# Patient Record
Sex: Female | Born: 1960 | Race: Black or African American | Hispanic: No | Smoking: Former smoker
Health system: Southern US, Community
[De-identification: ages and names within clinical notes are randomized; demographics above are authoritative.]

## PROBLEM LIST (undated history)

## (undated) DIAGNOSIS — Z794 Long term (current) use of insulin: Secondary | ICD-10-CM

## (undated) DIAGNOSIS — D631 Anemia in chronic kidney disease: Secondary | ICD-10-CM

## (undated) DIAGNOSIS — E785 Hyperlipidemia, unspecified: Secondary | ICD-10-CM

## (undated) DIAGNOSIS — E119 Type 2 diabetes mellitus without complications: Secondary | ICD-10-CM

## (undated) DIAGNOSIS — E039 Hypothyroidism, unspecified: Secondary | ICD-10-CM

## (undated) DIAGNOSIS — N186 End stage renal disease: Secondary | ICD-10-CM

## (undated) DIAGNOSIS — K219 Gastro-esophageal reflux disease without esophagitis: Secondary | ICD-10-CM

## (undated) DIAGNOSIS — Z992 Dependence on renal dialysis: Secondary | ICD-10-CM

## (undated) DIAGNOSIS — I471 Supraventricular tachycardia, unspecified: Secondary | ICD-10-CM

## (undated) DIAGNOSIS — M1990 Unspecified osteoarthritis, unspecified site: Secondary | ICD-10-CM

## (undated) DIAGNOSIS — N039 Chronic nephritic syndrome with unspecified morphologic changes: Secondary | ICD-10-CM

## (undated) DIAGNOSIS — E1142 Type 2 diabetes mellitus with diabetic polyneuropathy: Secondary | ICD-10-CM

## (undated) DIAGNOSIS — I10 Essential (primary) hypertension: Secondary | ICD-10-CM

## (undated) DIAGNOSIS — R011 Cardiac murmur, unspecified: Secondary | ICD-10-CM

## (undated) DIAGNOSIS — K3184 Gastroparesis: Secondary | ICD-10-CM

## (undated) DIAGNOSIS — D573 Sickle-cell trait: Secondary | ICD-10-CM

## (undated) DIAGNOSIS — E1143 Type 2 diabetes mellitus with diabetic autonomic (poly)neuropathy: Secondary | ICD-10-CM

## (undated) DIAGNOSIS — I739 Peripheral vascular disease, unspecified: Secondary | ICD-10-CM

## (undated) HISTORY — PX: TRANSTHORACIC ECHOCARDIOGRAM: SHX275

## (undated) HISTORY — DX: Essential (primary) hypertension: I10

## (undated) HISTORY — PX: KNEE ARTHROPLASTY: SHX992

## (undated) HISTORY — PX: THYROIDECTOMY: SHX17

## (undated) HISTORY — DX: Supraventricular tachycardia: I47.1

## (undated) HISTORY — PX: PERIPHERAL VASCULAR CATHETERIZATION: SHX172C

## (undated) HISTORY — DX: Supraventricular tachycardia, unspecified: I47.10

## (undated) HISTORY — PX: NM MYOVIEW LTD: HXRAD82

## (undated) HISTORY — DX: Peripheral vascular disease, unspecified: I73.9

## (undated) HISTORY — DX: Dependence on renal dialysis: N18.6

## (undated) HISTORY — DX: Type 2 diabetes mellitus with diabetic polyneuropathy: E11.42

## (undated) HISTORY — PX: THYROID SURGERY: SHX805

## (undated) HISTORY — DX: Type 2 diabetes mellitus without complications: Z79.4

## (undated) HISTORY — DX: Type 2 diabetes mellitus without complications: E11.9

## (undated) HISTORY — DX: Dependence on renal dialysis: Z99.2

## (undated) HISTORY — PX: KNEE SURGERY: SHX244

## (undated) HISTORY — PX: CATARACT EXTRACTION, BILATERAL: SHX1313

## (undated) HISTORY — DX: Hypothyroidism, unspecified: E03.9

## (undated) HISTORY — PX: ABDOMINAL HYSTERECTOMY: SHX81

## (undated) HISTORY — DX: Anemia in chronic kidney disease: D63.1

## (undated) HISTORY — DX: Chronic nephritic syndrome with unspecified morphologic changes: N03.9

---

## 2007-05-17 ENCOUNTER — Encounter: Admission: RE | Admit: 2007-05-17 | Discharge: 2007-07-25 | Payer: Self-pay | Admitting: Orthopedic Surgery

## 2007-08-30 ENCOUNTER — Ambulatory Visit (HOSPITAL_COMMUNITY): Admission: RE | Admit: 2007-08-30 | Discharge: 2007-08-30 | Payer: Self-pay | Admitting: Obstetrics & Gynecology

## 2007-10-11 ENCOUNTER — Emergency Department (HOSPITAL_COMMUNITY): Admission: EM | Admit: 2007-10-11 | Discharge: 2007-10-12 | Payer: Self-pay | Admitting: Emergency Medicine

## 2007-10-30 ENCOUNTER — Emergency Department (HOSPITAL_COMMUNITY): Admission: EM | Admit: 2007-10-30 | Discharge: 2007-10-30 | Payer: Self-pay | Admitting: Emergency Medicine

## 2008-01-23 ENCOUNTER — Emergency Department (HOSPITAL_COMMUNITY): Admission: EM | Admit: 2008-01-23 | Discharge: 2008-01-23 | Payer: Self-pay | Admitting: Emergency Medicine

## 2008-01-31 ENCOUNTER — Emergency Department (HOSPITAL_COMMUNITY): Admission: EM | Admit: 2008-01-31 | Discharge: 2008-02-01 | Payer: Self-pay | Admitting: Emergency Medicine

## 2008-04-08 ENCOUNTER — Emergency Department (HOSPITAL_COMMUNITY): Admission: EM | Admit: 2008-04-08 | Discharge: 2008-04-08 | Payer: Self-pay | Admitting: Emergency Medicine

## 2008-06-15 ENCOUNTER — Inpatient Hospital Stay (HOSPITAL_COMMUNITY): Admission: EM | Admit: 2008-06-15 | Discharge: 2008-06-17 | Payer: Self-pay | Admitting: Emergency Medicine

## 2008-08-02 ENCOUNTER — Inpatient Hospital Stay (HOSPITAL_COMMUNITY): Admission: AD | Admit: 2008-08-02 | Discharge: 2008-08-06 | Payer: Self-pay | Admitting: Cardiovascular Disease

## 2008-08-02 ENCOUNTER — Emergency Department (HOSPITAL_COMMUNITY): Admission: EM | Admit: 2008-08-02 | Discharge: 2008-08-02 | Payer: Self-pay | Admitting: Emergency Medicine

## 2009-02-26 ENCOUNTER — Ambulatory Visit (HOSPITAL_COMMUNITY): Admission: RE | Admit: 2009-02-26 | Discharge: 2009-02-26 | Payer: Self-pay | Admitting: Obstetrics & Gynecology

## 2009-03-08 ENCOUNTER — Inpatient Hospital Stay (HOSPITAL_COMMUNITY): Admission: EM | Admit: 2009-03-08 | Discharge: 2009-03-13 | Payer: Self-pay | Admitting: Emergency Medicine

## 2009-03-10 ENCOUNTER — Encounter (INDEPENDENT_AMBULATORY_CARE_PROVIDER_SITE_OTHER): Payer: Self-pay | Admitting: Internal Medicine

## 2009-06-06 ENCOUNTER — Emergency Department (HOSPITAL_COMMUNITY): Admission: EM | Admit: 2009-06-06 | Discharge: 2009-06-06 | Payer: Self-pay | Admitting: Emergency Medicine

## 2010-01-18 HISTORY — PX: OTHER SURGICAL HISTORY: SHX169

## 2010-03-25 DIAGNOSIS — I471 Supraventricular tachycardia, unspecified: Secondary | ICD-10-CM | POA: Insufficient documentation

## 2010-09-20 HISTORY — PX: ARTERIOVENOUS GRAFT PLACEMENT: SUR1029

## 2010-10-12 ENCOUNTER — Encounter: Payer: Self-pay | Admitting: Obstetrics & Gynecology

## 2010-12-25 LAB — CBC
HCT: 28.9 % — ABNORMAL LOW (ref 36.0–46.0)
Platelets: 223 10*3/uL (ref 150–400)
RBC: 4.07 MIL/uL (ref 3.87–5.11)
WBC: 6.1 10*3/uL (ref 4.0–10.5)

## 2010-12-25 LAB — DIFFERENTIAL
Basophils Absolute: 0 10*3/uL (ref 0.0–0.1)
Basophils Relative: 1 % (ref 0–1)
Eosinophils Relative: 4 % (ref 0–5)
Lymphocytes Relative: 34 % (ref 12–46)
Lymphs Abs: 2.1 10*3/uL (ref 0.7–4.0)
Monocytes Relative: 9 % (ref 3–12)
Neutrophils Relative %: 53 % (ref 43–77)

## 2010-12-25 LAB — POCT CARDIAC MARKERS
CKMB, poc: <1 ng/mL — ABNORMAL LOW (ref 1.0–8.0)
Troponin i, poc: <0.05 ng/mL (ref 0.00–0.09)

## 2010-12-25 LAB — BASIC METABOLIC PANEL
BUN: 45 mg/dL — ABNORMAL HIGH (ref 6–23)
Calcium: 8.9 mg/dL (ref 8.4–10.5)
Creatinine, Ser: 4.15 mg/dL — ABNORMAL HIGH (ref 0.4–1.2)
GFR calc Af Amer: 14 mL/min — ABNORMAL LOW (ref >60)
GFR calc non Af Amer: 11 mL/min — ABNORMAL LOW (ref >60)

## 2010-12-25 LAB — CK TOTAL AND CKMB (NOT AT ARMC)
CK, MB: 1.6 ng/mL (ref 0.3–4.0)
Relative Index: 0.8 (ref 0.0–2.5)

## 2010-12-28 LAB — BASIC METABOLIC PANEL
BUN: 42 mg/dL — ABNORMAL HIGH (ref 6–23)
Calcium: 8.5 mg/dL (ref 8.4–10.5)
Calcium: 8.5 mg/dL (ref 8.4–10.5)
Chloride: 102 mEq/L (ref 96–112)
Chloride: 106 mEq/L (ref 96–112)
Creatinine, Ser: 3.24 mg/dL — ABNORMAL HIGH (ref 0.4–1.2)
Creatinine, Ser: 3.71 mg/dL — ABNORMAL HIGH (ref 0.4–1.2)
GFR calc Af Amer: 16 mL/min — ABNORMAL LOW (ref >60)
GFR calc Af Amer: 17 mL/min — ABNORMAL LOW (ref >60)
GFR calc Af Amer: 18 mL/min — ABNORMAL LOW (ref >60)
GFR calc non Af Amer: 14 mL/min — ABNORMAL LOW (ref >60)
GFR calc non Af Amer: 15 mL/min — ABNORMAL LOW (ref >60)
GFR calc non Af Amer: 16 mL/min — ABNORMAL LOW (ref >60)
Glucose, Bld: 216 mg/dL — ABNORMAL HIGH (ref 70–99)
Potassium: 4.4 mEq/L (ref 3.5–5.1)
Potassium: 4.9 mEq/L (ref 3.5–5.1)
Sodium: 133 mEq/L — ABNORMAL LOW (ref 135–145)
Sodium: 136 mEq/L (ref 135–145)

## 2010-12-28 LAB — URINALYSIS, ROUTINE W REFLEX MICROSCOPIC
Glucose, UA: NEGATIVE mg/dL
Specific Gravity, Urine: 1.013 (ref 1.005–1.030)
Urobilinogen, UA: 0.2 mg/dL (ref 0.0–1.0)
pH: 6 (ref 5.0–8.0)

## 2010-12-28 LAB — CK TOTAL AND CKMB (NOT AT ARMC): Relative Index: 0.7 (ref 0.0–2.5)

## 2010-12-28 LAB — POCT I-STAT, CHEM 8
BUN: 39 mg/dL — ABNORMAL HIGH (ref 6–23)
Calcium, Ion: 0.98 mmol/L — ABNORMAL LOW (ref 1.12–1.32)
Chloride: 107 mEq/L (ref 96–112)
Creatinine, Ser: 3.3 mg/dL — ABNORMAL HIGH (ref 0.4–1.2)
Glucose, Bld: 78 mg/dL (ref 70–99)
TCO2: 24 mmol/L (ref 0–100)

## 2010-12-28 LAB — CBC
HCT: 24 % — ABNORMAL LOW (ref 36.0–46.0)
HCT: 27.1 % — ABNORMAL LOW (ref 36.0–46.0)
Hemoglobin: 7.8 g/dL — CL (ref 12.0–15.0)
Hemoglobin: 8.4 g/dL — ABNORMAL LOW (ref 12.0–15.0)
Hemoglobin: 8.8 g/dL — ABNORMAL LOW (ref 12.0–15.0)
MCHC: 32.2 g/dL (ref 30.0–36.0)
MCHC: 32.4 g/dL (ref 30.0–36.0)
MCHC: 32.7 g/dL (ref 30.0–36.0)
MCV: 72 fL — ABNORMAL LOW (ref 78.0–100.0)
Platelets: 169 10*3/uL (ref 150–400)
Platelets: 184 10*3/uL (ref 150–400)
Platelets: 184 10*3/uL (ref 150–400)
RBC: 3.35 MIL/uL — ABNORMAL LOW (ref 3.87–5.11)
RBC: 3.46 MIL/uL — ABNORMAL LOW (ref 3.87–5.11)
RBC: 3.51 MIL/uL — ABNORMAL LOW (ref 3.87–5.11)
RBC: 3.6 MIL/uL — ABNORMAL LOW (ref 3.87–5.11)
RBC: 3.76 MIL/uL — ABNORMAL LOW (ref 3.87–5.11)
RDW: 15 % (ref 11.5–15.5)
RDW: 15.2 % (ref 11.5–15.5)
WBC: 4.8 10*3/uL (ref 4.0–10.5)
WBC: 5.6 10*3/uL (ref 4.0–10.5)
WBC: 5.9 10*3/uL (ref 4.0–10.5)

## 2010-12-28 LAB — COMPREHENSIVE METABOLIC PANEL
ALT: 13 U/L (ref 0–35)
Albumin: 3 g/dL — ABNORMAL LOW (ref 3.5–5.2)
Alkaline Phosphatase: 74 U/L (ref 39–117)
Calcium: 8.4 mg/dL (ref 8.4–10.5)
GFR calc Af Amer: 20 mL/min — ABNORMAL LOW (ref >60)
Glucose, Bld: 93 mg/dL (ref 70–99)
Potassium: 4 mEq/L (ref 3.5–5.1)
Sodium: 141 mEq/L (ref 135–145)
Total Protein: 6.3 g/dL (ref 6.0–8.3)

## 2010-12-28 LAB — DIFFERENTIAL
Basophils Relative: 1 % (ref 0–1)
Eosinophils Absolute: 0.2 10*3/uL (ref 0.0–0.7)
Eosinophils Absolute: 0.2 10*3/uL (ref 0.0–0.7)
Lymphs Abs: 1.9 10*3/uL (ref 0.7–4.0)
Monocytes Absolute: 0.5 10*3/uL (ref 0.1–1.0)
Monocytes Absolute: 0.5 10*3/uL (ref 0.1–1.0)
Monocytes Relative: 10 % (ref 3–12)
Monocytes Relative: 9 % (ref 3–12)
Neutro Abs: 2 10*3/uL (ref 1.7–7.7)
Neutrophils Relative %: 43 % (ref 43–77)
Neutrophils Relative %: 44 % (ref 43–77)

## 2010-12-28 LAB — GLUCOSE, CAPILLARY
Glucose-Capillary: 120 mg/dL — ABNORMAL HIGH (ref 70–99)
Glucose-Capillary: 134 mg/dL — ABNORMAL HIGH (ref 70–99)
Glucose-Capillary: 172 mg/dL — ABNORMAL HIGH (ref 70–99)
Glucose-Capillary: 183 mg/dL — ABNORMAL HIGH (ref 70–99)
Glucose-Capillary: 219 mg/dL — ABNORMAL HIGH (ref 70–99)
Glucose-Capillary: 223 mg/dL — ABNORMAL HIGH (ref 70–99)
Glucose-Capillary: 254 mg/dL — ABNORMAL HIGH (ref 70–99)
Glucose-Capillary: 279 mg/dL — ABNORMAL HIGH (ref 70–99)

## 2010-12-28 LAB — TROPONIN I: Troponin I: 0.02 ng/mL (ref 0.00–0.06)

## 2010-12-28 LAB — RENAL FUNCTION PANEL
BUN: 36 mg/dL — ABNORMAL HIGH (ref 6–23)
CO2: 28 mEq/L (ref 19–32)
Calcium: 8.8 mg/dL (ref 8.4–10.5)
Creatinine, Ser: 3.49 mg/dL — ABNORMAL HIGH (ref 0.4–1.2)
Glucose, Bld: 162 mg/dL — ABNORMAL HIGH (ref 70–99)

## 2010-12-28 LAB — PROTEIN, URINE, 24 HOUR
Collection Interval-UPROT: 24 hours
Urine Total Volume-UPROT: 2500 mL

## 2010-12-28 LAB — URINE MICROSCOPIC-ADD ON

## 2010-12-28 LAB — T4, FREE: Free T4: 0.72 ng/dL — ABNORMAL LOW (ref 0.80–1.80)

## 2010-12-28 LAB — CARDIAC PANEL(CRET KIN+CKTOT+MB+TROPI)
CK, MB: 1.6 ng/mL (ref 0.3–4.0)
CK, MB: 2.1 ng/mL (ref 0.3–4.0)
CK, MB: 2.5 ng/mL (ref 0.3–4.0)
Relative Index: 0.8 (ref 0.0–2.5)
Relative Index: 0.9 (ref 0.0–2.5)
Total CK: 195 U/L — ABNORMAL HIGH (ref 7–177)
Total CK: 227 U/L — ABNORMAL HIGH (ref 7–177)
Troponin I: 0.01 ng/mL (ref 0.00–0.06)
Troponin I: 0.02 ng/mL (ref 0.00–0.06)

## 2010-12-28 LAB — VITAMIN B12: Vitamin B-12: 629 pg/mL (ref 211–911)

## 2010-12-28 LAB — PROTIME-INR: INR: 0.9 (ref 0.00–1.49)

## 2010-12-28 LAB — IRON AND TIBC
Iron: 55 ug/dL (ref 42–135)
Saturation Ratios: 21 % (ref 20–55)
TIBC: 262 ug/dL (ref 250–470)
UIBC: 207 ug/dL

## 2011-02-02 NOTE — Discharge Summary (Signed)
[name], [name]               ACCOUNT NO.:  [id number]   MEDICAL RECORD NO.:  [id number]          PATIENT TYPE:  INP   LOCATION:  [id number]                         FACILITY:  [facility]   PHYSICIAN:  Birdie Riddle, M.D.  DATE OF BIRTH:  1960/10/26   DATE OF ADMISSION:  06/15/2008  DATE OF DISCHARGE:  06/17/2008                               DISCHARGE SUMMARY   FINAL DIAGNOSES:  1. Chest pain.  2. Cardiac dysrhythmia.  3. Hypothyroidism.  4. Hypertension.  5. Diabetes mellitus type 2.  6. Anemia.  7. Tobacco use disorder.  8. Sickle cell trait.  9. Hypercholesterolemia.   DISCHARGE MEDICATIONS:  1. Insulin 75/25 as 25 units in the morning and 20 units in the      evening.  2. Lisinopril/hydrochlorothiazide 20/25 one daily in a.m.  3. Cardizem 180 mg one daily.  4. Folic acid 1 mg daily.  5. Metformin 850 mg twice daily.  6. Lipitor 20 mg one in the evening.  7. Protonix 40 mg one daily.  8. Iron as ferrous sulfate 325 mg one daily.  9. Multivitamin one daily.  10.Aspirin was held for concern for GI bleed.   DISCHARGE DIET:  Low-sodium, heart-healthy diet.   DISCHARGE ACTIVITY:  The patient to increase activity slowly.   SPECIAL INSTRUCTIONS:  1. The patient to stop any activity that causes chest pain, shortness      of breath, dizziness, sweating, or excessive weakness.  2. Followup by Dr. Dixie Dials in 2 weeks.  The patient to call [phone] for appointment.   HISTORY:  This 50 year old black female presented with left-sided chest  tightness along with some dizziness, weakness feeling lasting for 3-4  hours.  The patient also reported some palpitations in absence of  excessive caffeine intake.   PHYSICAL EXAMINATION:  GENERAL:  The patient was alert and oriented x3.  VITAL SIGNS:  Pulse is 130 to 59 in sinus rhythm, blood pressure 131/78.  HEENT:  Conjunctivae pink.  NECK:  Supple.  No JVD.  No bruit.  LUNGS:  Clear to auscultation.  CARDIOVASCULAR:  Regular  rate and rhythm.  Normal S1 and S2.  Soft  systolic murmur.  Negative S3 or gallop.  ABDOMEN:  Soft, bowel sounds present, nontender.  EXTREMITIES:  No edema, cyanosis, or clubbing.   LABORATORY DATA:  Normal WBC count, platelet count, hemoglobin slightly  low at 9.6, retic count was normal.  PT/INR was normal.  Electrolytes  were normal.  Sugar elevated at 209, BUN slightly high at 26, creatinine  slightly high at 1.23.  Cardiac enzymes were essentially unremarkable  with a borderline total CK levels.  Cholesterol level was 112 with LDL  level of 37, HDL level of 38, and triglyceride of 186.  Thyroid  stimulating hormone was 2.2.  Folate level was more than 20 ng/mL.  Iron  level was 58.   Nuclear stress test was negative for inducible reversible ischemia.   Chest x-ray showed low lung volume, otherwise no acute cardiopulmonary  disease.   EKG showed sinus bradycardia with possible anterior infarct.  HOSPITAL COURSE:  The patient was admitted to telemetry unit.  Myocardial infarction was ruled out.  She underwent nuclear stress test  that failed to show any reversible ischemia.  The patient had a history  of chronic anemia.  She had complete GI workup done 2 years ago in  [place], [place] that included upper and lower endoscopy.  The patient desired to have further GI workup done on outpatient basis.  Hence, she was discharged on June 17, 2008, in satisfactory  condition with followup by me in 2 weeks.      Birdie Riddle, M.D.  Electronically Signed     ASK/MEDQ  D:  07/20/2008  T:  07/20/2008  Job:  [id number]

## 2011-02-02 NOTE — H&P (Signed)
[name], [name]               ACCOUNT NO.:  [id number]   MEDICAL RECORD NO.:  [id number]           PATIENT TYPE:   LOCATION:                                 FACILITY:   PHYSICIAN:  Benito Mccreedy, M.D.DATE OF BIRTH:  [birth year]   DATE OF ADMISSION:  03/08/2009  DATE OF DISCHARGE:                              HISTORY & PHYSICAL   ADMITTING DIAGNOSES:  1. Hypertensive crisis.  2. Peripheral edema.  3. Renal insufficiency.  4. History of hypertension.  5. Diabetes mellitus.  6. Anemia of chronic disease.  7. Cigarette smoking.  8. Hyperlipidemia.   CHIEF COMPLAINTS:  High blood pressure headaches.   The patient is a 50 year old lady with history of multiple medical  problems as noted above.  She has been seen by her PCP for elevated  blood pressure with associated worsening of peripheral edema and was  initiated on Lasix diuresis last week.  However, the patient came to the  ED because of severe blood pressure more than Q000111Q systolic.  The patient  is still with frontal headaches.  On admission, the patient received  clonidine with improvement of her blood pressure.  She was admitted for  further management and followup.  She denies any history of cough, fever  or chills, visual changes, extremity weakness, or dizziness.  She has  had no chest pain or shortness of breath.  She had some nausea without  vomiting.  No abdominal pain, no dysuria, hematuria, progressive  micturition.   PAST MEDICAL HISTORY:  As noted above.   ALLERGIES:  The patient is having no known medication allergies.   CURRENT MEDICATIONS:  1. Diltiazem.  2. Lasix 20 mg daily.  3. Isosorbide mononitrate 60 mg daily.  4. Lisinopril 20 mg daily.  5. Protonix 40 mg daily.  6. Ferrous sulfate 325 mg daily.  7. Metoprolol 25 mg daily.  8. Multivitamin 1 capsule daily.  9. Levothyroxine 100 mcg daily.  10.Folic acid 1 mg daily.  11.Lipitor 20 mg daily.  12.Metformin 850 mg b.i.d.  13.Humalog  subcutaneous injection 75/25, 25 units b.i.d.   Family history is positive for hypertension.   SOCIAL HISTORY:  The patient smokes cigarettes but does not drink  alcohol.   REVIEW OF SYSTEMS:  On 12-point systemic enquiry except as noted above  was unremarkable.   PHYSICAL EXAMINATION:  VITAL SIGNS:  Blood pressure came down to Q000111Q  systolic, temp 0000000, pulse 67, respirations 18, O2 saturation 98% on  room air.  GENERAL:  The patient was not in acute cardiopulmonary distress.  HEENT:  Normocephalic, atraumatic.  Pupils equal, round, and reactive to  light.  Extraocular movements intact.  Oropharynx moist.  Sclerae,  pallor present.  NECK:  Supple.  No JVD.  LUNGS:  Clear to auscultation.  CARDIAC:  Regular.  No gallops.  ABDOMEN:  Soft.  Bowel sounds are present.  EXTREMITIES:  No cyanosis.  The pedal edema noted, right slightly more  than left.   Labs reviewed with x-ray of the chest indicating cardiomegaly without  acute infiltrates.  White count 4.6, hemoglobin 8.4, hematocrit 26.0,  platelet count 184.  Sodium 141, potassium 4.0, chloride 107, CO2 28,  glucose 93, BUN 33, creatinine 2.99, albumin 3.0; otherwise, complete  metabolic panel was unremarkable.  Total CPK 185, MB fraction 1.6.  Relative index 0.8.  TSH was elevated at 13.618.   ASSESSMENT/PLAN:  A 51 year old lady with history of hypertension and  multiple medical problems presenting with worsening hypertension  associated with headaches.  Systolic blood pressure more than 223,  however, improved with ED management.  The patient was admitted for  blood pressure control, monitor of her renal function, supportive care.  We will get a 2-D echocardiogram for her cardiomegaly with peripheral  edema.  We may have to cut back on ACE inhibitor if she has renal  insufficiency.  The patient has thyroid management, we have to be  optimize further with elevated TSH by increasing dose of levothyroxine.      Benito Mccreedy, M.D.  Electronically Signed     GO/MEDQ  D:  03/08/2009  T:  03/09/2009  Job:  [id number]   cc:   Nolene Ebbs, M.D.

## 2011-02-02 NOTE — Discharge Summary (Signed)
NAMEODALY, DENBY               ACCOUNT NO.:  [id number]   MEDICAL RECORD NO.:  [id number]          PATIENT TYPE:  OBV   LOCATION:  [id number]                         FACILITY:  [facility]   PHYSICIAN:  Birdie Riddle, M.D.  DATE OF BIRTH:  November 12, 1960   DATE OF ADMISSION:  08/02/2008  DATE OF DISCHARGE:  08/06/2008                               DISCHARGE SUMMARY   FINAL DIAGNOSES:  1. Abdominal pain.  2. Constipation.  3. Diabetic gastroparesis.  4. Colonic dysfunction.  5. Uncontrolled hypertension.  6. Dehydration.  7. Hypothyroidism.   DISCHARGE MEDICATIONS:  1. Lisinopril 20 mg 1 daily.  2. Nitroglycerin patch 0.4 mg per hour, apply new one in the morning      and off at bedtime.  3. MiraLax 17 g in 8 ounce fluid daily or Smooth Move as before.  4. Cardizem 180 mg 1 daily in the morning.  5. Folic acid 1 mg 1 daily in the morning.  6. Metformin 850 mg 1 twice daily.  7. Lipitor 20 mg 1 in the evening.  8. Protonix 40 mg 1 daily in the morning.  9. Ferrous sulfate 325 mg 1 in the evening.  10.Multivitamin 1 daily in the morning.  11.Synthroid 100 mcg 1 daily in the morning.  12.Insulin 75/25 units in the morning and 20 units in the evening      subcutaneously.   FOLLOWUP:  With Dr. Dixie Dials in 2 weeks.  The patient to call [phone] for appointment.   DISCHARGE DIET:  Low-sodium, heart healthy diet along with the low  calorie carbohydrate modified diet.   DISCHARGE ACTIVITY:  The patient to increase activity slowly and to stop  any activity that causes chest pain, shortness of breath, dizziness,  sweating, or excessive weakness.   HISTORY:  This 50 year old black female with a history of diabetes and  hypertension was not able to keep any of her medications down including  the liquids.  She went to the emergency room where x-ray was consistent  with constipation, but the blood pressure was 200/110 in the office.  The patient denies any headache, but had some  dizziness and had elevated  BUN and creatinine.   PAST MEDICAL HISTORY:  Positive for diabetes for 20 years and  hypertension, an elevated cholesterol level along with a chronic history  of anemia with GI workup done in [place], [place].   PHYSICAL EXAMINATION:  VITAL SIGNS:  Pulse 107, respirations 18, blood  pressure 200/110, height 5 feet 2 inches, and weight 177 pounds.  HEENT:  The patient is normocephalic and atraumatic with brown eyes.  Conjunctivae pink.  Sclerae white.  NECK:  No JVD.  No carotid bruits.  LUNGS:  Clear bilaterally.  HEART:  Normal S1 and S2.  ABDOMEN:  Soft questionable tenderness.  EXTREMITIES:  No edema, cyanosis, or clubbing.  CNS:  The patient moves all 4 extremities has bilateral equal grips and  she is right-handed.   LABORATORY DATA:  The patient had a low hemoglobin of 8.8.  Normal WBC  count and normal platelet count.  Normal electrolytes,  BUN and  creatinine.  Sugars slightly high at 147.   X-ray of the abdomen consistent with constipation.  H. pylori test  borderline and nuclear medicine gastric emptying was within normal  limits.   HOSPITAL COURSE:  The patient was admitted to Telemetry Unit.  Her most  of the oral medications were held.  She was given Zofran.  She was  started on IV fluid, nitroglycerin patch, and clonidine patch were used  for blood pressure control and then she was started on the clear  liquids.  The patient tolerated liquids well, although she continued to  have constipation.  She underwent gastric emptying test which was  normal.  Her abdominal pain had resolved on its own and her blood  pressure was controlled, hence on August 06, 2008, she was discharged  home in satisfactory condition with discontinuation of clonidine for low  heart rate of 50s and metoprolol was changed back to Cardizem,  nitroglycerin patch was added for blood pressure control, and she will  have outpatient GI workup.      Birdie Riddle, M.D.  Electronically Signed     ASK/MEDQ  D:  08/06/2008  T:  08/07/2008  Job:  [id number]

## 2011-02-05 NOTE — Discharge Summary (Signed)
NAMESHAYENNE, DONNA               ACCOUNT NO.:  [id number]   MEDICAL RECORD NO.:  [id number]          PATIENT TYPE:  INP   LOCATION:  4705                         FACILITY:  [facility]   PHYSICIAN:  Nolene Ebbs, M.D.    DATE OF BIRTH:  02-May-1961   DATE OF ADMISSION:  03/07/2009  DATE OF DISCHARGE:  03/13/2009                               DISCHARGE SUMMARY   HISTORY OF PRESENT ILLNESS:  Ms. Elting is a 50 year old African  American lady with past medical history significant for systemic  hypertension, type 2 diabetes mellitus, chronic kidney disease, anemia  of chronic disease, hyperlipidemia and tobacco abuse.  She came to the  emergency room at [facility] with progressively worsening  peripheral leg edema that is relieved by oral diuretics at home.  At the  emergency room, she was noted to have a markedly elevated blood pressure  with systolic of Q000111Q mmHg.  She also had frontal headaches.  At the  emergency room, she was treated with oral clonidine with some  improvement in her symptoms.  She denied having any chest pain or  shortness of breath.  Her initial laboratory data including a chest x-  ray showed cardiomegaly without acute infiltrates.  White count was 4.6,  hemoglobin 8.4, sodium was 141, potassium 4.0, BUN was 33, creatinine  2.99.  TSH was elevated at 13.618.  She was therefore admitted to the  hospital as a case of hypertensive crisis.   HOSPITAL COURSE:  On admission, the patient was placed on a telemetry  bed.  ACE inhibitors were put on hold due to renal insufficiency and her  dose of levothyroxine was adjusted, dose of her Cardizem was increased  to 240 mg daily and she was started on clonidine 0.1 mg t.i.d. Magnetic  resonance angiography of the abdomen was performed which revealed widely  patent renal arteries.  Two-D echocardiogram showed normal left  ventricular ejection fraction of 65-70%, a free T4 was 0.72.  Her CBGs  were monitored  before meals  and nightly and covered with sliding scale  NovoLog insulin.  Renal function did not improve significantly but  remained stable.  On March 13, 2009 she was feeling much better and eager  to go home.  Her blood pressure was 155/65, heart rate of 57.  Laboratory data showed a sodium of 136, potassium 4.7, chloride 106,  bicarbonate of 25, BUN was 42, creatinine 3.24, glucose of 215.  White  count was 5.9, hemoglobin 8.2, hematocrit 25.2, platelet count of 169.  She was therefore considered stable for discharge home to be followed up  in the outpatient.   DISCHARGE DIAGNOSES:  1. Hypertensive urgency.  2. Chronic kidney disease.  3. Type 2 diabetes mellitus.  4. Hypothyroidism.  5. Anemia of chronic disease.  6. Urinary tract infection.   She is to followup with me in the office in about 1 week and also to be  referred for colonoscopy and Nephrology consultation in the outpatient  setting.   MEDICATIONS ON DISCHARGE:  1. Levothyroxine 125 mcg daily.  2. Metoprolol 25 mg b.i.d.  3.  Clonidine 0.1 mg t.i.d.  4. Amlodipine 10 mg b.i.d.  5. Ciprofloxacin 250 mg p.o. b.i.d. for 5 days.  6. Ferrous sulfate 325 mg p.o. daily.  7. Multivitamin once a day.  8. Folic acid 1 mg daily.  9. Pantoprazole 40 mg daily.  10.Imdur 60 mg p.o. daily.  11.Lipitor 20 mg once a day.  12.Metoprolol 25 mg b.i.d.  13.Humalog insulin Mix 75/25 25 units in a.m. and 20 units in p.m.   This discharge plan was discussed with her and her questions were  answered.      Nolene Ebbs, M.D.  Electronically Signed     EA/MEDQ  D:  05/07/2009  T:  05/08/2009  Job:  [id number]

## 2011-06-11 LAB — CBC
HCT: 26.8 — ABNORMAL LOW
Hemoglobin: 8.7 — ABNORMAL LOW
RBC: 3.81 — ABNORMAL LOW

## 2011-06-11 LAB — URINALYSIS, ROUTINE W REFLEX MICROSCOPIC
Glucose, UA: NEGATIVE
Protein, ur: 100 — AB
Specific Gravity, Urine: 1.011
Urobilinogen, UA: 1

## 2011-06-11 LAB — BASIC METABOLIC PANEL
Calcium: 9.1
GFR calc Af Amer: 53 — ABNORMAL LOW
GFR calc non Af Amer: 44 — ABNORMAL LOW
Glucose, Bld: 125 — ABNORMAL HIGH
Potassium: 4.6
Sodium: 138

## 2011-06-11 LAB — TYPE AND SCREEN

## 2011-06-11 LAB — URINE CULTURE: Colony Count: >=100000

## 2011-06-11 LAB — DIFFERENTIAL
Eosinophils Relative: 3
Lymphocytes Relative: 40
Lymphs Abs: 2.2
Monocytes Absolute: 0.5
Monocytes Relative: 9
Neutro Abs: 2.7

## 2011-06-11 LAB — POCT CARDIAC MARKERS
CKMB, poc: 1.3
Myoglobin, poc: 79.3
Operator id: 151321
Troponin i, poc: <0.05

## 2011-06-11 LAB — URINE MICROSCOPIC-ADD ON

## 2011-06-18 LAB — BASIC METABOLIC PANEL
BUN: 22
Calcium: 9.2
Chloride: 110
Creatinine, Ser: 1.19
GFR calc Af Amer: 59 — ABNORMAL LOW

## 2011-06-18 LAB — CBC
MCV: 71 — ABNORMAL LOW
Platelets: 238
WBC: 6

## 2011-06-18 LAB — POCT I-STAT, CHEM 8
BUN: 26 — ABNORMAL HIGH
Hemoglobin: 10.2 — ABNORMAL LOW
Potassium: 5.3 — ABNORMAL HIGH
Sodium: 142
TCO2: 23

## 2011-06-18 LAB — TSH: TSH: 2.924

## 2011-06-18 LAB — DIFFERENTIAL
Basophils Relative: 1
Eosinophils Absolute: 0.3
Lymphs Abs: 2.2
Neutro Abs: 3
Neutrophils Relative %: 50

## 2011-06-21 LAB — POCT I-STAT, CHEM 8
Calcium, Ion: 1.08 — ABNORMAL LOW
Chloride: 109
Glucose, Bld: 209 — ABNORMAL HIGH
HCT: 31 — ABNORMAL LOW
Hemoglobin: 10.5 — ABNORMAL LOW

## 2011-06-21 LAB — URINALYSIS, ROUTINE W REFLEX MICROSCOPIC
Leukocytes, UA: NEGATIVE
Nitrite: NEGATIVE
Specific Gravity, Urine: 1.011
pH: 8

## 2011-06-21 LAB — BASIC METABOLIC PANEL
CO2: 24
Calcium: 8.5
Chloride: 109
GFR calc Af Amer: 58 — ABNORMAL LOW
Potassium: 4.8
Sodium: 140

## 2011-06-21 LAB — FOLATE: Folate: >20

## 2011-06-21 LAB — COMPREHENSIVE METABOLIC PANEL
ALT: 26
AST: 27
Calcium: 8.5
GFR calc Af Amer: 57 — ABNORMAL LOW
Potassium: 5.1
Sodium: 137
Total Protein: 6.4

## 2011-06-21 LAB — POCT CARDIAC MARKERS: Myoglobin, poc: 64.1

## 2011-06-21 LAB — URINE MICROSCOPIC-ADD ON

## 2011-06-21 LAB — APTT: aPTT: 26

## 2011-06-21 LAB — CK TOTAL AND CKMB (NOT AT ARMC)
CK, MB: 1.2
Total CK: 198 — ABNORMAL HIGH

## 2011-06-21 LAB — IRON AND TIBC: UIBC: 198

## 2011-06-21 LAB — MAGNESIUM: Magnesium: 1.6

## 2011-06-21 LAB — DIFFERENTIAL
Lymphs Abs: 2.7
Monocytes Relative: 7
Neutro Abs: 2.5
Neutrophils Relative %: 43

## 2011-06-21 LAB — CBC
Hemoglobin: 9 — ABNORMAL LOW
MCHC: 32.1
MCV: 72.3 — ABNORMAL LOW
RBC: 3.51 — ABNORMAL LOW
RBC: 3.92
RBC: 4.18
WBC: 5.3
WBC: 5.9
WBC: 7

## 2011-06-21 LAB — LIPID PANEL
Cholesterol: 112
LDL Cholesterol: 37
Total CHOL/HDL Ratio: 2.9

## 2011-06-21 LAB — CARDIAC PANEL(CRET KIN+CKTOT+MB+TROPI)
CK, MB: 1.2
Relative Index: 0.8
Total CK: 172
Troponin I: 0.04

## 2011-06-21 LAB — PROTIME-INR: INR: 0.9

## 2011-06-21 LAB — FERRITIN: Ferritin: 188 (ref 10–291)

## 2011-06-21 LAB — GLUCOSE, CAPILLARY
Glucose-Capillary: 117 — ABNORMAL HIGH
Glucose-Capillary: 142 — ABNORMAL HIGH

## 2011-06-22 LAB — COMPREHENSIVE METABOLIC PANEL
AST: 12
AST: 13
Albumin: 4.2
Alkaline Phosphatase: 86
BUN: 19
CO2: 24
Calcium: 8.8
Chloride: 104
Creatinine, Ser: 1.31 — ABNORMAL HIGH
Creatinine, Ser: 1.67 — ABNORMAL HIGH
GFR calc Af Amer: 40 — ABNORMAL LOW
GFR calc Af Amer: 53 — ABNORMAL LOW
GFR calc non Af Amer: 44 — ABNORMAL LOW
Potassium: 5
Total Bilirubin: 0.5
Total Protein: 7.9

## 2011-06-22 LAB — DIFFERENTIAL
Basophils Absolute: 0.1
Eosinophils Relative: 3
Lymphocytes Relative: 30
Monocytes Absolute: 0.5
Monocytes Relative: 8
Neutro Abs: 3.8

## 2011-06-22 LAB — URINALYSIS, ROUTINE W REFLEX MICROSCOPIC
Bilirubin Urine: NEGATIVE
Glucose, UA: NEGATIVE
Hgb urine dipstick: NEGATIVE
Specific Gravity, Urine: 1.014
Urobilinogen, UA: 1
pH: 5.5

## 2011-06-22 LAB — GLUCOSE, CAPILLARY
Glucose-Capillary: 132 — ABNORMAL HIGH
Glucose-Capillary: 155 — ABNORMAL HIGH
Glucose-Capillary: 163 — ABNORMAL HIGH
Glucose-Capillary: 171 — ABNORMAL HIGH
Glucose-Capillary: 177 — ABNORMAL HIGH
Glucose-Capillary: 191 — ABNORMAL HIGH
Glucose-Capillary: 193 — ABNORMAL HIGH
Glucose-Capillary: 208 — ABNORMAL HIGH

## 2011-06-22 LAB — IRON AND TIBC
Iron: 57
TIBC: 224 — ABNORMAL LOW

## 2011-06-22 LAB — CBC
HCT: 32.2 — ABNORMAL LOW
Hemoglobin: 10.4 — ABNORMAL LOW
Hemoglobin: 8.8 — ABNORMAL LOW
MCHC: 32.5
MCV: 71.2 — ABNORMAL LOW
Platelets: 257
RBC: 4.52
RDW: 14.7
WBC: 6.5

## 2011-06-22 LAB — LIPID PANEL
Cholesterol: 130
HDL: 49

## 2011-06-22 LAB — H. PYLORI ANTIBODY, IGG: H Pylori IgG: 1 — ABNORMAL HIGH

## 2011-06-22 LAB — RETICULOCYTES: Retic Count, Absolute: 32.5

## 2011-06-22 LAB — URINE MICROSCOPIC-ADD ON

## 2011-06-22 LAB — BASIC METABOLIC PANEL
CO2: 25
Calcium: 8.6
Creatinine, Ser: 1.5 — ABNORMAL HIGH
Glucose, Bld: 152 — ABNORMAL HIGH

## 2011-06-22 LAB — POCT CARDIAC MARKERS: Myoglobin, poc: 156

## 2011-06-22 LAB — T4, FREE: Free T4: 1.17

## 2011-06-22 LAB — TSH: TSH: 2.286

## 2012-05-22 ENCOUNTER — Emergency Department (HOSPITAL_COMMUNITY)
Admission: EM | Admit: 2012-05-22 | Discharge: 2012-05-23 | Disposition: A | Discharge status: Home / Self Care | Payer: Medicare Other | Attending: Emergency Medicine | Admitting: Emergency Medicine

## 2012-05-22 ENCOUNTER — Emergency Department (HOSPITAL_COMMUNITY): Payer: Medicare Other

## 2012-05-22 ENCOUNTER — Encounter (HOSPITAL_COMMUNITY): Payer: Self-pay

## 2012-05-22 ENCOUNTER — Emergency Department (HOSPITAL_COMMUNITY)
Admission: EM | Admit: 2012-05-22 | Discharge: 2012-05-22 | Disposition: A | Discharge status: Home / Self Care | Payer: Medicare Other | Source: Home / Self Care | Attending: Emergency Medicine | Admitting: Emergency Medicine

## 2012-05-22 ENCOUNTER — Encounter (HOSPITAL_COMMUNITY): Payer: Self-pay | Admitting: *Deleted

## 2012-05-22 DIAGNOSIS — Z794 Long term (current) use of insulin: Secondary | ICD-10-CM | POA: Insufficient documentation

## 2012-05-22 DIAGNOSIS — E119 Type 2 diabetes mellitus without complications: Secondary | ICD-10-CM | POA: Insufficient documentation

## 2012-05-22 DIAGNOSIS — I120 Hypertensive chronic kidney disease with stage 5 chronic kidney disease or end stage renal disease: Secondary | ICD-10-CM | POA: Insufficient documentation

## 2012-05-22 DIAGNOSIS — R112 Nausea with vomiting, unspecified: Secondary | ICD-10-CM | POA: Insufficient documentation

## 2012-05-22 DIAGNOSIS — R0602 Shortness of breath: Secondary | ICD-10-CM | POA: Insufficient documentation

## 2012-05-22 DIAGNOSIS — R509 Fever, unspecified: Secondary | ICD-10-CM

## 2012-05-22 DIAGNOSIS — I471 Supraventricular tachycardia: Secondary | ICD-10-CM

## 2012-05-22 DIAGNOSIS — N186 End stage renal disease: Secondary | ICD-10-CM | POA: Insufficient documentation

## 2012-05-22 DIAGNOSIS — Z992 Dependence on renal dialysis: Secondary | ICD-10-CM | POA: Insufficient documentation

## 2012-05-22 DIAGNOSIS — R197 Diarrhea, unspecified: Secondary | ICD-10-CM | POA: Insufficient documentation

## 2012-05-22 LAB — GLUCOSE, CAPILLARY: Glucose-Capillary: 173 mg/dL — ABNORMAL HIGH (ref 70–99)

## 2012-05-22 MED ORDER — SODIUM CHLORIDE 0.9 % IV BOLUS (SEPSIS): 1000.0000 mL | Freq: Once | INTRAVENOUS | Status: DC

## 2012-05-22 MED ORDER — ONDANSETRON HCL 4 MG/2ML IJ SOLN
INTRAMUSCULAR | Status: AC
  Administered 2012-05-22: 07:00:00
  Filled 2012-05-22: qty 2

## 2012-05-22 MED ORDER — ADENOSINE 6 MG/2ML IV SOLN
INTRAVENOUS | Status: AC
  Administered 2012-05-22: 07:00:00
  Filled 2012-05-22: qty 4

## 2012-05-22 MED ORDER — METRONIDAZOLE 500 MG PO TABS
500.0000 mg | ORAL_TABLET | Freq: Three times a day (TID) | ORAL | Status: DC
  Administered 2012-05-23: 500 mg via ORAL
  Filled 2012-05-22: qty 1

## 2012-05-22 NOTE — ED Notes (Signed)
Pt in xray

## 2012-05-22 NOTE — ED Notes (Signed)
Pt. Reports chills and fever starting at 1500. Pt. Took tylenol around 2200. Was seen earlier for SVT.

## 2012-05-22 NOTE — ED Notes (Signed)
Pt has HD in upper left arm

## 2012-05-22 NOTE — ED Notes (Addendum)
EMS called out for shortness of breath and on arrival found patient to be in SVT 180 with history.  Pt had 324mg  asa, one nitro, and 12mg  of adenosine and pt converted.  Pt had some chest pressure with elevated HR.

## 2012-05-22 NOTE — ED Provider Notes (Signed)
History     CSN: [id number]  Arrival date & time 05/22/12  2239   First MD Initiated Contact with Patient 05/22/12 2259      Chief Complaint  Patient presents with  . Fever    (Consider location/radiation/quality/duration/timing/severity/associated sxs/prior treatment) HPI The patient presents with new fever, nausea, diarrhea.  Notably, the patient was seen her earlier today, after an episode of palpitations and SVT.  That condition resolved with adenosine, and her evaluation was largely unremarkable following resolution of those symptoms.  She now notes that after returning home, resting she developed the new symptoms of nausea, fever, diarrhea, generalized discomfort.  She denies any chest pain or dyspnea throughout the day entirely. She states that she has been compliant with her medication, denies any new medication. The patient is a dialysis patient, last session was 2 days ago.  Past Medical History  Diagnosis Date  . Diabetes mellitus   . Hypertension   . Supraventricular tachycardia   . Renal disorder     Hemodialysis T, TH, Sat  . Thyroid disease     Past Surgical History  Procedure Date  . Abdominal hysterectomy   . Knee surgery     right  . Thyroid surgery     No family history on file.  History  Substance Use Topics  . Smoking status: Former Research scientist (life sciences)  . Smokeless tobacco: Not on file  . Alcohol Use: No    OB History    Grav Para Term Preterm Abortions TAB SAB Ect Mult Living                  Review of Systems  Constitutional:       HPI  HENT:       HPI otherwise negative  Eyes: Negative.   Respiratory:       HPI, otherwise negative  Cardiovascular:       HPI, otherwise nmegative  Gastrointestinal: Positive for nausea, vomiting and diarrhea. Negative for abdominal pain.       Patient notes nonproductive emesis  Genitourinary:       HPI, otherwise negative  Musculoskeletal:       HPI, otherwise negative  Skin: Negative.   Neurological:  Negative for syncope.    Allergies  Review of patient's allergies indicates no known allergies.  Home Medications   Current Outpatient Rx  Name Route Sig Dispense Refill  . ACETAMINOPHEN 500 MG PO TABS Oral Take 1,000 mg by mouth every 6 (six) hours as needed. For fever    . ASPIRIN 81 MG PO CHEW Oral Chew 81 mg by mouth daily.    Marland Kitchen CLONIDINE HCL 0.2 MG PO TABS Oral Take 0.2 mg by mouth at bedtime.    Marland Kitchen ESOMEPRAZOLE MAGNESIUM 40 MG PO CPDR Oral Take 40 mg by mouth daily before breakfast.    . INSULIN LISPRO PROT & LISPRO (75-25) 100 UNIT/ML [place] SUSP Subcutaneous Inject 20-30 Units into the skin See admin instructions. Takes 30 units in the morning and 20 units in the evening    . LEVOTHYROXINE SODIUM 125 MCG PO TABS Oral Take 125 mcg by mouth daily.    Marland Kitchen LISINOPRIL 10 MG PO TABS Oral Take 10 mg by mouth at bedtime.    Marland Kitchen METOPROLOL TARTRATE 100 MG PO TABS Oral Take 100 mg by mouth daily.    Marland Kitchen RENA-VITE PO TABS Oral Take 1 tablet by mouth daily.    Marland Kitchen NIFEDIPINE PO Oral Take 60 mg by mouth 3 (three) times daily.    Marland Kitchen  SEVELAMER CARBONATE 800 MG PO TABS Oral Take 800 mg by mouth 3 (three) times daily with meals. Also take 1 tablet with snacks    . SIMVASTATIN 40 MG PO TABS Oral Take 40 mg by mouth every evening.      BP 182/71  Pulse 101  Temp 99.4 F (37.4 C) (Oral)  Resp 20  SpO2 97%  Physical Exam  Constitutional: She is oriented to person, place, and time. She appears well-developed and well-nourished.  HENT:  Head: Normocephalic and atraumatic.  Eyes: Pupils are equal, round, and reactive to light.  Neck: Normal range of motion. Neck supple.  Cardiovascular: Normal rate and regular rhythm.   Murmur heard. Pulmonary/Chest: Effort normal and breath sounds normal. No respiratory distress. She has no wheezes. She has no rales. She exhibits no tenderness.  Abdominal: Soft. Bowel sounds are normal. There is no tenderness. There is no rebound and no guarding.  Musculoskeletal: Normal  range of motion. She exhibits no edema.       Left arm graft site is unremarkable  Lymphadenopathy:    She has no cervical adenopathy.  Neurological: She is alert and oriented to person, place, and time.  Skin: Skin is warm and dry. No rash noted.  Psychiatric: She has a normal mood and affect.    ED Course  Procedures (including critical care time)   Labs Reviewed  LACTIC ACID, PLASMA  CBC WITH DIFFERENTIAL  COMPREHENSIVE METABOLIC PANEL  URINALYSIS, ROUTINE W REFLEX MICROSCOPIC  URINE CULTURE   Dg Chest 2 View  05/22/2012  *RADIOLOGY REPORT*  Clinical Data: Shortness of breath, fever, vomiting, history hypertension, diabetes  CHEST - 2 VIEW  Comparison: 06/06/2009  Findings: Minimal enlargement of the cardiac silhouette. Slight pulmonary vascular prominence. Mediastinal contours normal. Scattered interstitial prominence throughout both lungs increased since previous exam, favor edema over infection. No segmental consolidation, pleural effusion or pneumothorax. Stent identified at left axillary and subclavian vessel question vein.  IMPRESSION: Diffuse interstitial infiltrates question pulmonary edema.   Original Report Authenticated By: Burnetta Sabin, M.D.      No diagnosis found.  Cardiac 100 and sinus tach abnormal Pulse ox 97% nasal cannula abnormal   Date: 05/22/2012  Rate: 84  Rhythm: normal sinus rhythm  QRS Axis: normal  Intervals: normal  ST/T Wave abnormalities: normal  Conduction Disutrbances:none  Narrative Interpretation:   Old EKG Reviewed: changes noted Unremarkable  Update, the patient feels substantially better.   MDM  This patient with multiple medical problems, including end-stage renal disease, on dialysis, prior diverticulitis now presents with concerns of nausea, diarrhea, fever.  Notably, the patient was evaluated earlier today for different complaints, and specifies that all of these issues are new.  Given her history, there is some suspicion of  diverticulitis, though there is no abdominal pain on exam, versus C. difficile colitis, given her risk factors.  Absent chest pain, dyspnea, distress, there is some reassurance in this presentation.  The patient was empirically treated with Flagyl.  She noted a substantial improvement in her condition while in the ED.  Given leukocytosis, history of diverticulitis, though this would be an atypical presentation her antibiotic regimen was broadened to include appropriate coverage given the resolution of symptoms, the improvement in her vital signs and condition the patient is appropriate to continue management as an outpatient.  Specifically, the patient will see her physician this morning, during dialysis.  The patient also had hyperkalemia, though no evidence of cardiac effects on ecg.  With the temporal  proximity of dialysis, this should resolve.  Carmin Muskrat, MD 05/23/12 [phone]

## 2012-05-22 NOTE — ED Notes (Signed)
Per EMS, pt. Here earlier today for SVT, was given adenosine and converted to NSR. Pt. States started having chills, fever 102.3 orally. 100.7 tympanic by EMS. Hx of dialysis, graft left arm.

## 2012-05-22 NOTE — ED Provider Notes (Addendum)
History     CSN: [id number]  Arrival date & time 05/22/12  [id number]   First MD Initiated Contact with Patient 05/22/12 [phone]      Chief Complaint  Patient presents with  . Shortness of Breath  . Irregular Heart Beat    (Consider location/radiation/quality/duration/timing/severity/associated sxs/prior treatment) HPI Comments: Pt presents this am with heart palpitations.  She has a hx of PSVT and usually has a couple of episodes/year.  She says that sometimes she can control it at home with vagal maneuvers, but sometimes she has to come in and get adenosine.  She states that she had an episode this am that woke her up.  It was associated with chest pain and SOB, which is normal for her during these episodes.  She tried vagal maneuvers without improvement, called EMS who administered adenosine 6mg  x1 with conversion to a sinus rhythm.  Pt feels much better now.  She feels fatigued, but has not CP, SOB, or dizziness.  Denies any recent CP or SOB other than that associated with her SVT today.  Patient is a 51 y.o. female presenting with shortness of breath. The history is provided by the patient.  Shortness of Breath  Associated symptoms include chest pain and shortness of breath. Pertinent negatives include no fever, no rhinorrhea and no cough.    Past Medical History  Diagnosis Date  . Diabetes mellitus   . Hypertension   . Supraventricular tachycardia   . Renal disorder     Hemodialysis T, TH, Sat  . Thyroid disease     Past Surgical History  Procedure Date  . Abdominal hysterectomy   . Knee surgery     right  . Thyroid surgery     No family history on file.  History  Substance Use Topics  . Smoking status: Former Research scientist (life sciences)  . Smokeless tobacco: Not on file  . Alcohol Use: No    OB History    Grav Para Term Preterm Abortions TAB SAB Ect Mult Living                  Review of Systems  Constitutional: Positive for fatigue. Negative for fever, chills and diaphoresis.    HENT: Negative for congestion, rhinorrhea and sneezing.   Eyes: Negative.   Respiratory: Positive for chest tightness and shortness of breath. Negative for cough.   Cardiovascular: Positive for chest pain and palpitations. Negative for leg swelling.  Gastrointestinal: Positive for nausea. Negative for vomiting, abdominal pain, diarrhea and blood in stool.  Genitourinary: Negative for frequency, hematuria, flank pain and difficulty urinating.  Musculoskeletal: Negative for back pain and arthralgias.  Skin: Negative for rash.  Neurological: Negative for dizziness, speech difficulty, weakness, numbness and headaches.    Allergies  Review of patient's allergies indicates no known allergies.  Home Medications   Current Outpatient Rx  Name Route Sig Dispense Refill  . ASPIRIN 81 MG PO CHEW Oral Chew 81 mg by mouth daily.    Marland Kitchen CLONIDINE HCL 0.2 MG PO TABS Oral Take 0.2 mg by mouth at bedtime.    Marland Kitchen ESOMEPRAZOLE MAGNESIUM 40 MG PO CPDR Oral Take 40 mg by mouth daily before breakfast.    . INSULIN LISPRO PROT & LISPRO (75-25) 100 UNIT/ML  SUSP Subcutaneous Inject 20-30 Units into the skin See admin instructions. Takes 30 units in the morning and 20 units in the evening    . LEVOTHYROXINE SODIUM 125 MCG PO TABS Oral Take 125 mcg by mouth daily.    Marland Kitchen  LISINOPRIL 10 MG PO TABS Oral Take 10 mg by mouth at bedtime.    Marland Kitchen METOPROLOL TARTRATE 100 MG PO TABS Oral Take 100 mg by mouth daily.    Marland Kitchen RENA-VITE PO TABS Oral Take 1 tablet by mouth daily.    Marland Kitchen NIFEDIPINE PO Oral Take 60 mg by mouth 3 (three) times daily.    Marland Kitchen SEVELAMER CARBONATE 800 MG PO TABS Oral Take 800 mg by mouth 3 (three) times daily with meals. Also take 1 tablet with snacks    . SIMVASTATIN 40 MG PO TABS Oral Take 40 mg by mouth every evening.      BP 168/72  Pulse 88  Temp 98.7 F (37.1 C) (Oral)  Resp 18  SpO2 99%  Physical Exam  Constitutional: She is oriented to person, place, and time. She appears well-developed and  well-nourished.  HENT:  Head: Normocephalic and atraumatic.  Eyes: Pupils are equal, round, and reactive to light.  Neck: Normal range of motion. Neck supple.  Cardiovascular: Normal rate and regular rhythm.   Murmur heard. Pulmonary/Chest: Effort normal and breath sounds normal. No respiratory distress. She has no wheezes. She has no rales. She exhibits no tenderness.  Abdominal: Soft. Bowel sounds are normal. There is no tenderness. There is no rebound and no guarding.  Musculoskeletal: Normal range of motion. She exhibits no edema.  Lymphadenopathy:    She has no cervical adenopathy.  Neurological: She is alert and oriented to person, place, and time.  Skin: Skin is warm and dry. No rash noted.  Psychiatric: She has a normal mood and affect.    ED Course  Procedures (including critical care time)  Labs Reviewed - No data to display No results found.  Date: 05/22/2012  Rate: 87  Rhythm: normal sinus rhythm  QRS Axis: normal  Intervals: normal  ST/T Wave abnormalities: nonspecific ST/T changes  Conduction Disutrbances:none  Narrative Interpretation:   Old EKG Reviewed: changes noted, PR depression   Date: 05/22/2012  Rate: 79  Rhythm: normal sinus rhythm  QRS Axis: normal  Intervals: normal  ST/T Wave abnormalities: normal  Conduction Disutrbances:none  Narrative Interpretation:   Old EKG Reviewed: unchanged    1. Paroxysmal SVT (supraventricular tachycardia)       MDM  Pt has been asymptomatic since arrival to ED other than fatigue which is normal for her after these episodes.  I did note narrow complex SVT on EMS strips, converted with one dose of adenosine.  Pt says that this is the same as her past episodes.  Has had no other recent cardiac sounding symptoms.  No further episodes in the ED.  Will d/c to f/u with her cardiologist.  Pt has dialysis tomorrow.        Malvin Johns, MD 05/22/12 [id number]  Malvin Johns, MD 05/22/12 [phone]

## 2012-05-22 NOTE — ED Notes (Signed)
Pt reports waking w/chest pressure, sob, and "fast" heart rate. Pt has a hx of SVT, pt's was in SVT upon EMS arrival. Pt given asa 324 mg, nitro x1, and Adenosine 12 mg IVP en route, converted to a HR of 80. Pt denies chest pain/pressure, SOB, or nausea. Pt has hemodialysis Tuesday, Thursday, and Saturday. Pt completed her dialysis tx on Saturday. Nad, pt a/o x4

## 2012-05-23 LAB — CBC WITH DIFFERENTIAL/PLATELET
Basophils Relative: 0 % (ref 0–1)
Eosinophils Absolute: 0.5 10*3/uL (ref 0.0–0.7)
Eosinophils Relative: 5 % (ref 0–5)
HCT: 34.5 % — ABNORMAL LOW (ref 36.0–46.0)
Hemoglobin: 10.8 g/dL — ABNORMAL LOW (ref 12.0–15.0)
MCH: 24.3 pg — ABNORMAL LOW (ref 26.0–34.0)
MCHC: 31.3 g/dL (ref 30.0–36.0)
Monocytes Absolute: 0.6 10*3/uL (ref 0.1–1.0)
Monocytes Relative: 8 % (ref 3–12)

## 2012-05-23 LAB — COMPREHENSIVE METABOLIC PANEL
Albumin: 3.8 g/dL (ref 3.5–5.2)
BUN: 40 mg/dL — ABNORMAL HIGH (ref 6–23)
Creatinine, Ser: 8.41 mg/dL — ABNORMAL HIGH (ref 0.50–1.10)
Total Bilirubin: 0.3 mg/dL (ref 0.3–1.2)
Total Protein: 8.2 g/dL (ref 6.0–8.3)

## 2012-05-23 MED ORDER — CIPROFLOXACIN HCL 500 MG PO TABS
500.0000 mg | ORAL_TABLET | Freq: Once | ORAL | Status: AC
  Administered 2012-05-23: 500 mg via ORAL
  Filled 2012-05-23: qty 1

## 2012-05-23 MED ORDER — CIPROFLOXACIN HCL 500 MG PO TABS: 500.0000 mg | ORAL_TABLET | Freq: Two times a day (BID) | ORAL | Status: AC

## 2012-05-23 MED ORDER — METRONIDAZOLE 500 MG PO TABS: 500.0000 mg | ORAL_TABLET | Freq: Three times a day (TID) | ORAL | Status: AC

## 2012-05-23 NOTE — ED Notes (Signed)
Pt. BP elevated. Pt. Has BP medicine at home she takes at bedtime which she states "I'm gonna go home to take that medicine".

## 2012-05-23 NOTE — ED Notes (Signed)
IV team at bedside 

## 2012-06-20 ENCOUNTER — Other Ambulatory Visit: Payer: Self-pay

## 2012-06-20 ENCOUNTER — Encounter (HOSPITAL_COMMUNITY): Payer: Self-pay | Admitting: *Deleted

## 2012-06-20 ENCOUNTER — Emergency Department (HOSPITAL_COMMUNITY)
Admission: EM | Admit: 2012-06-20 | Discharge: 2012-06-20 | Disposition: A | Discharge status: Home Health | Payer: Medicare Other | Attending: Emergency Medicine | Admitting: Emergency Medicine

## 2012-06-20 ENCOUNTER — Emergency Department (HOSPITAL_COMMUNITY): Payer: Medicare Other

## 2012-06-20 DIAGNOSIS — Z794 Long term (current) use of insulin: Secondary | ICD-10-CM | POA: Insufficient documentation

## 2012-06-20 DIAGNOSIS — E0789 Other specified disorders of thyroid: Secondary | ICD-10-CM | POA: Insufficient documentation

## 2012-06-20 DIAGNOSIS — Z79899 Other long term (current) drug therapy: Secondary | ICD-10-CM | POA: Insufficient documentation

## 2012-06-20 DIAGNOSIS — Z992 Dependence on renal dialysis: Secondary | ICD-10-CM | POA: Insufficient documentation

## 2012-06-20 DIAGNOSIS — Z7982 Long term (current) use of aspirin: Secondary | ICD-10-CM | POA: Insufficient documentation

## 2012-06-20 DIAGNOSIS — E119 Type 2 diabetes mellitus without complications: Secondary | ICD-10-CM | POA: Insufficient documentation

## 2012-06-20 DIAGNOSIS — I471 Supraventricular tachycardia: Secondary | ICD-10-CM

## 2012-06-20 DIAGNOSIS — I498 Other specified cardiac arrhythmias: Secondary | ICD-10-CM | POA: Insufficient documentation

## 2012-06-20 DIAGNOSIS — R0602 Shortness of breath: Secondary | ICD-10-CM | POA: Insufficient documentation

## 2012-06-20 DIAGNOSIS — N189 Chronic kidney disease, unspecified: Secondary | ICD-10-CM | POA: Insufficient documentation

## 2012-06-20 DIAGNOSIS — I129 Hypertensive chronic kidney disease with stage 1 through stage 4 chronic kidney disease, or unspecified chronic kidney disease: Secondary | ICD-10-CM | POA: Insufficient documentation

## 2012-06-20 LAB — CBC WITH DIFFERENTIAL/PLATELET
Eosinophils Relative: 3 % (ref 0–5)
HCT: 39.3 % (ref 36.0–46.0)
Lymphocytes Relative: 29 % (ref 12–46)
Lymphs Abs: 1.6 10*3/uL (ref 0.7–4.0)
MCV: 77.2 fL — ABNORMAL LOW (ref 78.0–100.0)
Platelets: 131 10*3/uL — ABNORMAL LOW (ref 150–400)
RBC: 5.09 MIL/uL (ref 3.87–5.11)
WBC: 5.4 10*3/uL (ref 4.0–10.5)

## 2012-06-20 LAB — COMPREHENSIVE METABOLIC PANEL
ALT: 24 U/L (ref 0–35)
Alkaline Phosphatase: 118 U/L — ABNORMAL HIGH (ref 39–117)
CO2: 30 mEq/L (ref 19–32)
Calcium: 10.1 mg/dL (ref 8.4–10.5)
GFR calc Af Amer: 10 mL/min — ABNORMAL LOW (ref >90)
GFR calc non Af Amer: 9 mL/min — ABNORMAL LOW (ref >90)
Glucose, Bld: 162 mg/dL — ABNORMAL HIGH (ref 70–99)
Sodium: 137 mEq/L (ref 135–145)
Total Bilirubin: 0.5 mg/dL (ref 0.3–1.2)

## 2012-06-20 MED ORDER — ADENOSINE 6 MG/2ML IV SOLN
INTRAVENOUS | Status: AC
  Administered 2012-06-20: 6 mg
  Filled 2012-06-20: qty 6

## 2012-06-20 MED ORDER — SODIUM CHLORIDE 0.9 % IV SOLN
INTRAVENOUS | Status: DC
  Administered 2012-06-20: 15:00:00 via INTRAVENOUS

## 2012-06-20 MED ORDER — METOPROLOL TARTRATE 1 MG/ML IV SOLN
INTRAVENOUS | Status: AC
  Administered 2012-06-20: 5 mg
  Filled 2012-06-20: qty 5

## 2012-06-20 MED ORDER — ADENOSINE 6 MG/2ML IV SOLN
INTRAVENOUS | Status: AC
  Filled 2012-06-20: qty 6

## 2012-06-20 MED ORDER — METOPROLOL TARTRATE 1 MG/ML IV SOLN
INTRAVENOUS | Status: AC
  Administered 2012-06-20: 5 mg via INTRAVENOUS
  Filled 2012-06-20: qty 5

## 2012-06-20 NOTE — ED Notes (Signed)
EJ removed with cath intact. Pt tolerated well

## 2012-06-20 NOTE — ED Provider Notes (Addendum)
History     CSN: [id number]  Arrival date & time 06/20/12  1355   First MD Initiated Contact with Patient 06/20/12 1407      Chief Complaint  Patient presents with  . Shortness of Breath    (Consider location/radiation/quality/duration/timing/severity/associated sxs/prior treatment) Patient is a 51 y.o. female presenting with shortness of breath. The history is provided by the patient.  Shortness of Breath  Associated symptoms include shortness of breath.   patient here with tachycardia that began during her dialysis treatment today. She was able to complete her entire treatment however heart rate still has increased. A recent chest pain, fever, chills. History of SVT in the past and takes Lopressor for this. EMS was called and patient transported here  Past Medical History  Diagnosis Date  . Diabetes mellitus   . Hypertension   . Supraventricular tachycardia   . Renal disorder     Hemodialysis T, TH, Sat  . Thyroid disease     Past Surgical History  Procedure Date  . Abdominal hysterectomy   . Knee surgery     right  . Thyroid surgery     No family history on file.  History  Substance Use Topics  . Smoking status: Former Research scientist (life sciences)  . Smokeless tobacco: Not on file  . Alcohol Use: No    OB History    Grav Para Term Preterm Abortions TAB SAB Ect Mult Living                  Review of Systems  Respiratory: Positive for shortness of breath.   All other systems reviewed and are negative.    Allergies  Review of patient's allergies indicates no known allergies.  Home Medications   Current Outpatient Rx  Name Route Sig Dispense Refill  . ACETAMINOPHEN 500 MG PO TABS Oral Take 1,000 mg by mouth every 6 (six) hours as needed. For fever    . ASPIRIN 81 MG PO CHEW Oral Chew 81 mg by mouth daily.    Marland Kitchen CLONIDINE HCL 0.2 MG PO TABS Oral Take 0.2 mg by mouth at bedtime.    Marland Kitchen ESOMEPRAZOLE MAGNESIUM 40 MG PO CPDR Oral Take 40 mg by mouth daily before breakfast.    .  INSULIN LISPRO PROT & LISPRO (75-25) 100 UNIT/ML [place] SUSP Subcutaneous Inject 20-30 Units into the skin See admin instructions. Takes 30 units in the morning and 20 units in the evening    . LEVOTHYROXINE SODIUM 125 MCG PO TABS Oral Take 125 mcg by mouth daily.    Marland Kitchen LISINOPRIL 10 MG PO TABS Oral Take 10 mg by mouth at bedtime.    Marland Kitchen METOPROLOL TARTRATE 100 MG PO TABS Oral Take 100 mg by mouth daily.    Marland Kitchen RENA-VITE PO TABS Oral Take 1 tablet by mouth daily.    Marland Kitchen NIFEDIPINE PO Oral Take 60 mg by mouth 3 (three) times daily.    Marland Kitchen SEVELAMER CARBONATE 800 MG PO TABS Oral Take 800 mg by mouth 3 (three) times daily with meals. Also take 1 tablet with snacks    . SIMVASTATIN 40 MG PO TABS Oral Take 40 mg by mouth every evening.      BP 116/72  Pulse 144  Temp 98.1 F (36.7 C) (Oral)  Resp 22  SpO2 99%  Physical Exam  Nursing note and vitals reviewed. Constitutional: She is oriented to person, place, and time. She appears well-developed and well-nourished.  Non-toxic appearance. No distress.  HENT:  Head: Normocephalic  and atraumatic.  Eyes: Conjunctivae normal, EOM and lids are normal. Pupils are equal, round, and reactive to light.  Neck: Normal range of motion. Neck supple. No tracheal deviation present. No mass present.  Cardiovascular: Regular rhythm and normal heart sounds.  Tachycardia present.  Exam reveals no gallop.   No murmur heard. Pulmonary/Chest: Effort normal and breath sounds normal. No stridor. No respiratory distress. She has no decreased breath sounds. She has no wheezes. She has no rhonchi. She has no rales.  Abdominal: Soft. Normal appearance and bowel sounds are normal. She exhibits no distension. There is no tenderness. There is no rebound and no CVA tenderness.  Musculoskeletal: Normal range of motion. She exhibits no edema and no tenderness.  Neurological: She is alert and oriented to person, place, and time. She has normal strength. No cranial nerve deficit or sensory  deficit. GCS eye subscore is 4. GCS verbal subscore is 5. GCS motor subscore is 6.  Skin: Skin is warm and dry. No abrasion and no rash noted.  Psychiatric: She has a normal mood and affect. Her speech is normal and behavior is normal.    ED Course  Procedures (including critical care time)   Labs Reviewed  CBC WITH DIFFERENTIAL  COMPREHENSIVE METABOLIC PANEL   No results found.   No diagnosis found.    MDM   Date: 06/20/2012  Rate: 144  Rhythm: supraventricular tachycardia (SVT)  QRS Axis: normal  Intervals: normal  ST/T Wave abnormalities: nonspecific ST changes  Conduction Disutrbances:none  Narrative Interpretation:   Old EKG Reviewed: changes noted   4:14 PM Patient given Lopressor IV push x2 and remains in sinus tach. She was then given adenosine 6 mg and converted to sinus rhythm rhythm. Patient to continue her Lopressor and followup with her cardiologist  CRITICAL CARE Performed by: Leota Jacobsen   Total critical care time: 50  Critical care time was exclusive of separately billable procedures and treating other patients.  Critical care was necessary to treat or prevent imminent or life-threatening deterioration.  Critical care was time spent personally by me on the following activities: development of treatment plan with patient and/or surrogate as well as nursing, discussions with consultants, evaluation of patient's response to treatment, examination of patient, obtaining history from patient or surrogate, ordering and performing treatments and interventions, ordering and review of laboratory studies, ordering and review of radiographic studies, pulse oximetry and re-evaluation of patient's condition.        Leota Jacobsen, MD 06/20/12 1615  Leota Jacobsen, MD 06/20/12 [phone]

## 2012-06-20 NOTE — ED Notes (Signed)
PT was being dialyzed this am with heart racing during treatment.  Pt reports sob.  Pt finished dialysis.  Pt reports chest pain and heart rate in the 140s

## 2012-06-21 ENCOUNTER — Encounter: Payer: Self-pay | Admitting: *Deleted

## 2012-06-21 ENCOUNTER — Encounter: Payer: Self-pay | Admitting: Cardiovascular Disease

## 2012-06-21 DIAGNOSIS — I10 Essential (primary) hypertension: Secondary | ICD-10-CM | POA: Insufficient documentation

## 2012-06-21 DIAGNOSIS — N289 Disorder of kidney and ureter, unspecified: Secondary | ICD-10-CM | POA: Insufficient documentation

## 2012-06-21 DIAGNOSIS — E079 Disorder of thyroid, unspecified: Secondary | ICD-10-CM | POA: Insufficient documentation

## 2012-06-22 ENCOUNTER — Ambulatory Visit: Payer: Medicare Other | Admitting: Cardiovascular Disease

## 2012-07-11 ENCOUNTER — Emergency Department (HOSPITAL_COMMUNITY): Payer: Medicare Other

## 2012-07-11 ENCOUNTER — Inpatient Hospital Stay (HOSPITAL_COMMUNITY)
Admission: EM | Admit: 2012-07-11 | Discharge: 2012-07-20 | DRG: 207 | Disposition: A | Discharge status: Home / Self Care | Payer: Medicare Other | Attending: Internal Medicine | Admitting: Internal Medicine

## 2012-07-11 ENCOUNTER — Inpatient Hospital Stay (HOSPITAL_COMMUNITY): Payer: Medicare Other

## 2012-07-11 ENCOUNTER — Encounter (HOSPITAL_COMMUNITY): Payer: Self-pay | Admitting: *Deleted

## 2012-07-11 DIAGNOSIS — G931 Anoxic brain damage, not elsewhere classified: Secondary | ICD-10-CM | POA: Diagnosis present

## 2012-07-11 DIAGNOSIS — E1149 Type 2 diabetes mellitus with other diabetic neurological complication: Secondary | ICD-10-CM | POA: Diagnosis present

## 2012-07-11 DIAGNOSIS — E119 Type 2 diabetes mellitus without complications: Secondary | ICD-10-CM

## 2012-07-11 DIAGNOSIS — I469 Cardiac arrest, cause unspecified: Secondary | ICD-10-CM | POA: Diagnosis present

## 2012-07-11 DIAGNOSIS — K633 Ulcer of intestine: Secondary | ICD-10-CM | POA: Diagnosis present

## 2012-07-11 DIAGNOSIS — N2581 Secondary hyperparathyroidism of renal origin: Secondary | ICD-10-CM | POA: Diagnosis present

## 2012-07-11 DIAGNOSIS — J9600 Acute respiratory failure, unspecified whether with hypoxia or hypercapnia: Secondary | ICD-10-CM | POA: Diagnosis present

## 2012-07-11 DIAGNOSIS — J810 Acute pulmonary edema: Principal | ICD-10-CM | POA: Diagnosis present

## 2012-07-11 DIAGNOSIS — E669 Obesity, unspecified: Secondary | ICD-10-CM

## 2012-07-11 DIAGNOSIS — J9601 Acute respiratory failure with hypoxia: Secondary | ICD-10-CM

## 2012-07-11 DIAGNOSIS — N186 End stage renal disease: Secondary | ICD-10-CM | POA: Diagnosis present

## 2012-07-11 DIAGNOSIS — D62 Acute posthemorrhagic anemia: Secondary | ICD-10-CM | POA: Diagnosis not present

## 2012-07-11 DIAGNOSIS — E039 Hypothyroidism, unspecified: Secondary | ICD-10-CM | POA: Diagnosis present

## 2012-07-11 DIAGNOSIS — K625 Hemorrhage of anus and rectum: Secondary | ICD-10-CM | POA: Diagnosis present

## 2012-07-11 DIAGNOSIS — E876 Hypokalemia: Secondary | ICD-10-CM | POA: Diagnosis not present

## 2012-07-11 DIAGNOSIS — Z992 Dependence on renal dialysis: Secondary | ICD-10-CM

## 2012-07-11 DIAGNOSIS — I120 Hypertensive chronic kidney disease with stage 5 chronic kidney disease or end stage renal disease: Secondary | ICD-10-CM | POA: Diagnosis present

## 2012-07-11 DIAGNOSIS — I10 Essential (primary) hypertension: Secondary | ICD-10-CM

## 2012-07-11 DIAGNOSIS — G589 Mononeuropathy, unspecified: Secondary | ICD-10-CM | POA: Diagnosis present

## 2012-07-11 DIAGNOSIS — D696 Thrombocytopenia, unspecified: Secondary | ICD-10-CM | POA: Diagnosis not present

## 2012-07-11 DIAGNOSIS — E1142 Type 2 diabetes mellitus with diabetic polyneuropathy: Secondary | ICD-10-CM | POA: Diagnosis present

## 2012-07-11 DIAGNOSIS — K219 Gastro-esophageal reflux disease without esophagitis: Secondary | ICD-10-CM | POA: Diagnosis present

## 2012-07-11 HISTORY — DX: Dependence on renal dialysis: Z99.2

## 2012-07-11 HISTORY — DX: Gastroparesis: K31.84

## 2012-07-11 HISTORY — DX: Type 2 diabetes mellitus with diabetic autonomic (poly)neuropathy: E11.43

## 2012-07-11 HISTORY — DX: Hyperlipidemia, unspecified: E78.5

## 2012-07-11 HISTORY — DX: Sickle-cell trait: D57.3

## 2012-07-11 HISTORY — DX: Gastro-esophageal reflux disease without esophagitis: K21.9

## 2012-07-11 LAB — POCT I-STAT, CHEM 8
BUN: 44 mg/dL — ABNORMAL HIGH (ref 6–23)
BUN: 41 mg/dL — ABNORMAL HIGH (ref 6–23)
Calcium, Ion: 1.27 mmol/L — ABNORMAL HIGH (ref 1.12–1.23)
Calcium, Ion: 1.11 mmol/L — ABNORMAL LOW (ref 1.12–1.23)
Calcium, Ion: 1.01 mmol/L — ABNORMAL LOW (ref 1.12–1.23)
Chloride: 100 mEq/L (ref 96–112)
Creatinine, Ser: 6.4 mg/dL — ABNORMAL HIGH (ref 0.50–1.10)
Creatinine, Ser: 7 mg/dL — ABNORMAL HIGH (ref 0.50–1.10)
Creatinine, Ser: 3 mg/dL — ABNORMAL HIGH (ref 0.50–1.10)
Creatinine, Ser: 4.5 mg/dL — ABNORMAL HIGH (ref 0.50–1.10)
Glucose, Bld: 149 mg/dL — ABNORMAL HIGH (ref 70–99)
Glucose, Bld: 151 mg/dL — ABNORMAL HIGH (ref 70–99)
HCT: 36 % (ref 36.0–46.0)
HCT: 30 % — ABNORMAL LOW (ref 36.0–46.0)
Hemoglobin: 8.2 g/dL — ABNORMAL LOW (ref 12.0–15.0)
Hemoglobin: 12.2 g/dL (ref 12.0–15.0)
Hemoglobin: 10.2 g/dL — ABNORMAL LOW (ref 12.0–15.0)
Potassium: 3.7 mEq/L (ref 3.5–5.1)
Sodium: 140 mEq/L (ref 135–145)
Sodium: 141 mEq/L (ref 135–145)
TCO2: 23 mmol/L (ref 0–100)
TCO2: 21 mmol/L (ref 0–100)
TCO2: 26 mmol/L (ref 0–100)
TCO2: 22 mmol/L (ref 0–100)

## 2012-07-11 LAB — COMPREHENSIVE METABOLIC PANEL
ALT: 26 U/L (ref 0–35)
BUN: 42 mg/dL — ABNORMAL HIGH (ref 6–23)
CO2: 19 mEq/L (ref 19–32)
Calcium: 10.4 mg/dL (ref 8.4–10.5)
Creatinine, Ser: 7.95 mg/dL — ABNORMAL HIGH (ref 0.50–1.10)
Glucose, Bld: 215 mg/dL — ABNORMAL HIGH (ref 70–99)
Sodium: 134 mEq/L — ABNORMAL LOW (ref 135–145)
Total Protein: 6.6 g/dL (ref 6.0–8.3)

## 2012-07-11 LAB — BASIC METABOLIC PANEL
CO2: 22 mEq/L (ref 19–32)
Chloride: 99 mEq/L (ref 96–112)
Chloride: 98 mEq/L (ref 96–112)
GFR calc Af Amer: 6 mL/min — ABNORMAL LOW (ref >90)
GFR calc Af Amer: 18 mL/min — ABNORMAL LOW (ref >90)
GFR calc non Af Amer: 5 mL/min — ABNORMAL LOW (ref >90)
Glucose, Bld: 152 mg/dL — ABNORMAL HIGH (ref 70–99)
Potassium: 5.8 mEq/L — ABNORMAL HIGH (ref 3.5–5.1)
Potassium: 3.9 mEq/L (ref 3.5–5.1)
Potassium: 3.2 mEq/L — ABNORMAL LOW (ref 3.5–5.1)
Sodium: 135 mEq/L (ref 135–145)
Sodium: 137 mEq/L (ref 135–145)

## 2012-07-11 LAB — HCG, SERUM, QUALITATIVE: Preg, Serum: NEGATIVE

## 2012-07-11 LAB — POCT I-STAT 3, ART BLOOD GAS (G3+)
Acid-Base Excess: 3 mmol/L — ABNORMAL HIGH (ref 0.0–2.0)
Acid-base deficit: 1 mmol/L (ref 0.0–2.0)
Bicarbonate: 24.9 mEq/L — ABNORMAL HIGH (ref 20.0–24.0)
O2 Saturation: 82 %
O2 Saturation: 100 %
Patient temperature: 31.4
Patient temperature: 30.1
TCO2: 27 mmol/L (ref 0–100)
TCO2: 26 mmol/L (ref 0–100)
pCO2 arterial: 52.4 mmHg — ABNORMAL HIGH (ref 35.0–45.0)
pCO2 arterial: 34.1 mmHg — ABNORMAL LOW (ref 35.0–45.0)
pH, Arterial: 7.452 — ABNORMAL HIGH (ref 7.350–7.450)
pH, Arterial: 7.608 (ref 7.350–7.450)
pO2, Arterial: 559 mmHg — ABNORMAL HIGH (ref 80.0–100.0)

## 2012-07-11 LAB — CBC WITH DIFFERENTIAL/PLATELET
Eosinophils Absolute: 0.4 10*3/uL (ref 0.0–0.7)
Eosinophils Relative: 3 % (ref 0–5)
HCT: 30.9 % — ABNORMAL LOW (ref 36.0–46.0)
Lymphocytes Relative: 43 % (ref 12–46)
Lymphs Abs: 5.1 10*3/uL — ABNORMAL HIGH (ref 0.7–4.0)
MCH: 23.1 pg — ABNORMAL LOW (ref 26.0–34.0)
MCV: 76.9 fL — ABNORMAL LOW (ref 78.0–100.0)
Monocytes Absolute: 0.4 10*3/uL (ref 0.1–1.0)
Platelets: 154 10*3/uL (ref 150–400)
RBC: 4.02 MIL/uL (ref 3.87–5.11)

## 2012-07-11 LAB — APTT
aPTT: 55 seconds — ABNORMAL HIGH (ref 24–37)
aPTT: 34 seconds (ref 24–37)

## 2012-07-11 LAB — PROTIME-INR
Prothrombin Time: 16.8 seconds — ABNORMAL HIGH (ref 11.6–15.2)
Prothrombin Time: 16.7 seconds — ABNORMAL HIGH (ref 11.6–15.2)

## 2012-07-11 LAB — GLUCOSE, CAPILLARY
Glucose-Capillary: 156 mg/dL — ABNORMAL HIGH (ref 70–99)
Glucose-Capillary: 120 mg/dL — ABNORMAL HIGH (ref 70–99)
Glucose-Capillary: 143 mg/dL — ABNORMAL HIGH (ref 70–99)
Glucose-Capillary: 112 mg/dL — ABNORMAL HIGH (ref 70–99)

## 2012-07-11 LAB — CK TOTAL AND CKMB (NOT AT ARMC)
CK, MB: 4.1 ng/mL — ABNORMAL HIGH (ref 0.3–4.0)
Total CK: 104 U/L (ref 7–177)

## 2012-07-11 LAB — TROPONIN I: Troponin I: 0.57 ng/mL (ref <0.30)

## 2012-07-11 MED ORDER — NOREPINEPHRINE BITARTRATE 1 MG/ML IJ SOLN
0.5000 ug/min | INTRAVENOUS | Status: DC
  Administered 2012-07-11 (×2): 10 ug/min via INTRAVENOUS
  Administered 2012-07-13: 5 ug/min via INTRAVENOUS
  Filled 2012-07-11 (×2): qty 4

## 2012-07-11 MED ORDER — CISATRACURIUM BOLUS VIA INFUSION
0.1000 mg/kg | Freq: Once | INTRAVENOUS | Status: DC
  Filled 2012-07-11: qty 8

## 2012-07-11 MED ORDER — SODIUM CHLORIDE 0.9 % IV BOLUS (SEPSIS)
2000.0000 mL | Freq: Once | INTRAVENOUS | Status: AC
  Administered 2012-07-11: 2000 mL via INTRAVENOUS

## 2012-07-11 MED ORDER — NA FERRIC GLUC CPLX IN SUCROSE 12.5 MG/ML IV SOLN
62.5000 mg | INTRAVENOUS | Status: DC
  Administered 2012-07-18: 62.5 mg via INTRAVENOUS
  Filled 2012-07-11 (×2): qty 5

## 2012-07-11 MED ORDER — HEPARIN SODIUM (PORCINE) 5000 UNIT/ML IJ SOLN
5000.0000 [IU] | Freq: Three times a day (TID) | INTRAMUSCULAR | Status: DC
  Administered 2012-07-11 – 2012-07-12 (×4): 5000 [IU] via SUBCUTANEOUS
  Filled 2012-07-11 (×8): qty 1

## 2012-07-11 MED ORDER — DEXTROSE 5 % IV SOLN
2.0000 g | Freq: Once | INTRAVENOUS | Status: AC
  Administered 2012-07-11: 2 g via INTRAVENOUS
  Filled 2012-07-11: qty 2

## 2012-07-11 MED ORDER — CHLORHEXIDINE GLUCONATE 0.12 % MT SOLN
15.0000 mL | Freq: Two times a day (BID) | OROMUCOSAL | Status: DC
  Administered 2012-07-12 – 2012-07-16 (×10): 15 mL via OROMUCOSAL
  Filled 2012-07-11 (×10): qty 15

## 2012-07-11 MED ORDER — SODIUM CHLORIDE 0.9 % IV SOLN
0.0500 ug/kg/min | Freq: Once | INTRAVENOUS | Status: DC | PRN
  Filled 2012-07-11: qty 20

## 2012-07-11 MED ORDER — SODIUM CHLORIDE 0.9 % IV SOLN
0.0500 ug/kg/min | Freq: Once | INTRAVENOUS | Status: AC | PRN
  Filled 2012-07-11: qty 20

## 2012-07-11 MED ORDER — HEPARIN SODIUM (PORCINE) 1000 UNIT/ML DIALYSIS
2000.0000 [IU] | INTRAMUSCULAR | Status: DC | PRN
  Administered 2012-07-20: 2000 [IU] via INTRAVENOUS_CENTRAL
  Filled 2012-07-11: qty 2

## 2012-07-11 MED ORDER — SODIUM CHLORIDE 0.9 % IV SOLN
2000.0000 mL | Freq: Once | INTRAVENOUS | Status: AC
  Administered 2012-07-11: 10 mL via INTRAVENOUS

## 2012-07-11 MED ORDER — MIDAZOLAM BOLUS VIA INFUSION
1.0000 mg | INTRAVENOUS | Status: DC | PRN
  Filled 2012-07-11: qty 2

## 2012-07-11 MED ORDER — NOREPINEPHRINE BITARTRATE 1 MG/ML IJ SOLN
2.0000 ug/min | INTRAVENOUS | Status: DC
  Filled 2012-07-11: qty 4

## 2012-07-11 MED ORDER — SODIUM CHLORIDE 0.9 % IV SOLN
25.0000 ug/h | INTRAVENOUS | Status: DC
  Filled 2012-07-11: qty 50

## 2012-07-11 MED ORDER — PANTOPRAZOLE SODIUM 40 MG IV SOLR
40.0000 mg | Freq: Every day | INTRAVENOUS | Status: DC
  Administered 2012-07-11 – 2012-07-16 (×6): 40 mg via INTRAVENOUS
  Filled 2012-07-11 (×6): qty 40

## 2012-07-11 MED ORDER — DIAZEPAM 5 MG/ML IJ SOLN
INTRAMUSCULAR | Status: AC
  Administered 2012-07-11: 5 mg
  Filled 2012-07-11: qty 2

## 2012-07-11 MED ORDER — FENTANYL BOLUS VIA INFUSION
50.0000 ug | INTRAVENOUS | Status: DC | PRN
  Filled 2012-07-11: qty 50

## 2012-07-11 MED ORDER — SODIUM CHLORIDE 0.9 % IV SOLN
25.0000 ug/h | INTRAVENOUS | Status: DC
  Administered 2012-07-11 – 2012-07-13 (×3): 100 ug/h via INTRAVENOUS
  Filled 2012-07-11 (×2): qty 50

## 2012-07-11 MED ORDER — NOREPINEPHRINE BITARTRATE 1 MG/ML IJ SOLN
0.5000 ug/min | INTRAVENOUS | Status: DC
  Filled 2012-07-11: qty 4

## 2012-07-11 MED ORDER — SODIUM CHLORIDE 0.9 % IV SOLN
1.0000 ug/kg/min | INTRAVENOUS | Status: DC
  Administered 2012-07-11: 1.176 ug/kg/min via INTRAVENOUS
  Filled 2012-07-11: qty 20

## 2012-07-11 MED ORDER — SODIUM CHLORIDE 0.9 % IV SOLN: INTRAVENOUS | Status: DC

## 2012-07-11 MED ORDER — BIOTENE DRY MOUTH MT LIQD
15.0000 mL | Freq: Four times a day (QID) | OROMUCOSAL | Status: DC
  Administered 2012-07-12 – 2012-07-16 (×19): 15 mL via OROMUCOSAL

## 2012-07-11 MED ORDER — SODIUM CHLORIDE 0.9 % IV SOLN
10.0000 ug/h | INTRAVENOUS | Status: DC
  Administered 2012-07-11: 25 ug/h via INTRAVENOUS
  Filled 2012-07-11: qty 50

## 2012-07-11 MED ORDER — CISATRACURIUM BOLUS VIA INFUSION: 0.0500 mg/kg | Freq: Once | INTRAVENOUS | Status: DC | PRN

## 2012-07-11 MED ORDER — VANCOMYCIN HCL 1000 MG IV SOLR
1500.0000 mg | Freq: Once | INTRAVENOUS | Status: AC
  Administered 2012-07-11: 1500 mg via INTRAVENOUS
  Filled 2012-07-11: qty 1500

## 2012-07-11 MED ORDER — CISATRACURIUM BOLUS VIA INFUSION: 0.1000 mg/kg | Freq: Once | INTRAVENOUS | Status: DC

## 2012-07-11 MED ORDER — INSULIN ASPART 100 UNIT/ML ~~LOC~~ SOLN
0.0000 [IU] | SUBCUTANEOUS | Status: DC
  Administered 2012-07-11 – 2012-07-13 (×6): 1 [IU] via SUBCUTANEOUS
  Administered 2012-07-13 (×2): 2 [IU] via SUBCUTANEOUS
  Administered 2012-07-13 – 2012-07-16 (×5): 1 [IU] via SUBCUTANEOUS
  Administered 2012-07-16: 2 [IU] via SUBCUTANEOUS
  Administered 2012-07-16 – 2012-07-17 (×2): 1 [IU] via SUBCUTANEOUS
  Administered 2012-07-17: 2 [IU] via SUBCUTANEOUS
  Administered 2012-07-18 (×2): 1 [IU] via SUBCUTANEOUS
  Administered 2012-07-19: 2 [IU] via SUBCUTANEOUS
  Administered 2012-07-19: 5 [IU] via SUBCUTANEOUS
  Administered 2012-07-19 – 2012-07-20 (×2): 1 [IU] via SUBCUTANEOUS

## 2012-07-11 MED ORDER — IPRATROPIUM BROMIDE 0.02 % IN SOLN
RESPIRATORY_TRACT | Status: AC
  Administered 2012-07-11: 0.5 mg via RESPIRATORY_TRACT
  Filled 2012-07-11: qty 2.5

## 2012-07-11 MED ORDER — FENTANYL CITRATE 0.05 MG/ML IJ SOLN: 100.0000 ug | Freq: Once | INTRAMUSCULAR | Status: DC

## 2012-07-11 MED ORDER — CISATRACURIUM BOLUS VIA INFUSION
0.1000 mg/kg | Freq: Once | INTRAVENOUS | Status: AC
  Administered 2012-07-11: 8.5 mg via INTRAVENOUS
  Filled 2012-07-11: qty 8

## 2012-07-11 MED ORDER — FENTANYL CITRATE 0.05 MG/ML IJ SOLN: 100.0000 ug | INTRAMUSCULAR | Status: DC | PRN

## 2012-07-11 MED ORDER — SODIUM CHLORIDE 0.9 % IV SOLN
1.0000 mg/h | INTRAVENOUS | Status: DC
  Administered 2012-07-11: 1 mg/h via INTRAVENOUS
  Filled 2012-07-11: qty 10

## 2012-07-11 MED ORDER — PARICALCITOL 5 MCG/ML IV SOLN
9.0000 ug | INTRAVENOUS | Status: DC
  Filled 2012-07-11 (×3): qty 1.8

## 2012-07-11 MED ORDER — PANTOPRAZOLE SODIUM 40 MG PO PACK
40.0000 mg | PACK | Freq: Every day | ORAL | Status: DC
  Filled 2012-07-11: qty 20

## 2012-07-11 MED ORDER — SODIUM CHLORIDE 0.9 % IV SOLN
1.0000 mg/h | INTRAVENOUS | Status: DC
  Administered 2012-07-11 – 2012-07-13 (×4): 4 mg/h via INTRAVENOUS
  Filled 2012-07-11 (×5): qty 10

## 2012-07-11 MED ORDER — ASPIRIN 300 MG RE SUPP
300.0000 mg | RECTAL | Status: AC
  Administered 2012-07-11: 300 mg via RECTAL
  Filled 2012-07-11: qty 1

## 2012-07-11 MED ORDER — DARBEPOETIN ALFA-POLYSORBATE 100 MCG/0.5ML IJ SOLN
100.0000 ug | INTRAMUSCULAR | Status: DC
  Administered 2012-07-18: 100 ug via INTRAVENOUS
  Filled 2012-07-11: qty 0.5

## 2012-07-11 MED ORDER — SODIUM CHLORIDE 0.9 % IV SOLN
0.5000 ug/kg/min | INTRAVENOUS | Status: DC
  Filled 2012-07-11: qty 20

## 2012-07-11 MED ORDER — FENTANYL BOLUS VIA INFUSION
25.0000 ug | Freq: Four times a day (QID) | INTRAVENOUS | Status: DC | PRN
  Filled 2012-07-11: qty 100

## 2012-07-11 MED ORDER — ALBUTEROL SULFATE (5 MG/ML) 0.5% IN NEBU
INHALATION_SOLUTION | RESPIRATORY_TRACT | Status: AC
  Administered 2012-07-11: 5 mg via RESPIRATORY_TRACT
  Filled 2012-07-11: qty 1

## 2012-07-11 MED ORDER — SODIUM CHLORIDE 0.9 % IV SOLN
1.0000 ug/kg/min | INTRAVENOUS | Status: DC
  Filled 2012-07-11: qty 20

## 2012-07-11 MED ORDER — PROPOFOL 10 MG/ML IV EMUL
5.0000 ug/kg/min | INTRAVENOUS | Status: DC
  Administered 2012-07-11: 10 ug/kg/min via INTRAVENOUS
  Filled 2012-07-11: qty 100

## 2012-07-11 MED ORDER — ALBUTEROL SULFATE (5 MG/ML) 0.5% IN NEBU
5.0000 mg | INHALATION_SOLUTION | Freq: Once | RESPIRATORY_TRACT | Status: AC
  Administered 2012-07-11: 5 mg via RESPIRATORY_TRACT

## 2012-07-11 MED ORDER — SODIUM CHLORIDE 0.9 % IV SOLN
1.0000 ug/kg/min | INTRAVENOUS | Status: DC
  Administered 2012-07-11 – 2012-07-13 (×2): 1 ug/kg/min via INTRAVENOUS
  Filled 2012-07-11 (×2): qty 20

## 2012-07-11 MED ORDER — IPRATROPIUM BROMIDE 0.02 % IN SOLN
0.5000 mg | Freq: Once | RESPIRATORY_TRACT | Status: AC
  Administered 2012-07-11: 0.5 mg via RESPIRATORY_TRACT

## 2012-07-11 MED ORDER — SODIUM CHLORIDE 0.9 % IV SOLN
INTRAVENOUS | Status: DC
  Administered 2012-07-19: 250 mL via INTRAVENOUS

## 2012-07-11 MED ORDER — ARTIFICIAL TEARS OP OINT
1.0000 "application " | TOPICAL_OINTMENT | Freq: Three times a day (TID) | OPHTHALMIC | Status: DC
  Administered 2012-07-11 – 2012-07-13 (×6): 1 via OPHTHALMIC
  Filled 2012-07-11 (×2): qty 3.5

## 2012-07-11 MED ORDER — SODIUM CHLORIDE 0.9 % IV SOLN: 1.0000 ug/kg/min | INTRAVENOUS | Status: DC

## 2012-07-11 NOTE — Consult Note (Signed)
ANTIBIOTIC CONSULT NOTE - INITIAL  Pharmacy Consult for Vancomycin + Ceftazidime Indication: ? Bacteremia/line infection  No Known Allergies  Patient Measurements: Height: 5\' 7"  (170.2 cm) Weight: 159 lb 4.8 oz (72.258 kg) IBW/kg (Calculated) : 61.6   Vital Signs: Temp src: Oral (10/22 1135) BP: 171/88 mmHg (10/22 1219) Pulse Rate: 84  (10/22 1219) Intake/Output from previous day:   Intake/Output from this shift:    Labs:  [facility] 07/11/12 1207 07/11/12 1154  WBC 12.0* --  HGB 9.3* 11.9*  PLT 154 --  LABCREA -- --  CREATININE 7.95* 6.40*   Estimated Creatinine Clearance: 8.1 ml/min (by C-G formula based on Cr of 7.95).   Microbiology: No results found for this or any previous visit (from the past 720 hour(s)).  Medical History: Past Medical History  Diagnosis Date  . Renal disorder   . Diabetes mellitus without complication   . Hypertension   . Thyroid disorder   . GERD (gastroesophageal reflux disease)   . Neuropathy    Assessment: 51yof who went into PEA arrest today at dialysis, pulses regained with CPR and epi, and hypothermia protocol started. She has pulmonary edema and refractory hypoxia and therefore will need STAT HD.  She will also begin empiric antibiotics for possible bacteremia/line infection. Unsure of usual HD schedule at this point so will give loading doses after her HD session today and follow up further HD plans for maintenance dosing.  Goal of Therapy:  Vancomycin pre-HD level 15-25 Appropriate Ceftazidime dosing  Plan:  1) Vancomycin 1500mg  IV x 1 2) Ceftazidime 2g IV x 1 3) Follow up HD plans for further dosing  Deboraha Sprang 07/11/2012,2:38 PM

## 2012-07-11 NOTE — Procedures (Signed)
Limited echo Performed in ED R/o pericardial effusion S/p arrest  1. poor imgaes 2. LVH 3. small pericardial effusion 4. LV fxn reduced 5. RV kinetic  Lavon Paganini. Titus Mould, MD, [place] Pgr: [facility]

## 2012-07-11 NOTE — ED Notes (Signed)
Patient was received from dialysis- Patient with agonal respiration - CPR started at dialysis- Intubated with 8.0 ET tube - 2 Epi's, 1 gram of calcium, 50 mil bicarb, 5 mg Versed enroute - Patient with ST on monitor on arrival to ED- 1000 ml of fluid during transport.

## 2012-07-11 NOTE — Progress Notes (Signed)
Patient sats not coming up. RT took patient off vent and bagged with peep valve attached to bag. Richardson Landry Minor with CCM at bedside.

## 2012-07-11 NOTE — ED Notes (Addendum)
Artic Sun protocol to be started per Dr. Audie Pinto, respiratory at the bedside to place arterial line. Waiting on medications from pharmacy at the time. Pt remains sedated and on cardiac monitor.

## 2012-07-11 NOTE — Progress Notes (Signed)
Dr. Nelda Marseille with CCM notified that pt has not reached goal temp with in 2 &1/2 hours. No new orders at this time.   Loni Muse, RN

## 2012-07-11 NOTE — Procedures (Signed)
Arterial Catheter Insertion Procedure Note [place] [id number] 1961/05/21  Procedure: Insertion of Arterial Catheter  Indications: Blood pressure monitoring  Procedure Details Consent: Unable to obtain consent because of altered level of consciousness. Time Out: Verified patient identification, verified procedure, site/side was marked, verified correct patient position, special equipment/implants available, medications/allergies/relevent history reviewed, required imaging and test results available.  Performed  Maximum sterile technique was used including antiseptics, cap, gloves, gown, hand hygiene, mask and sheet. Skin prep: Chlorhexidine; local anesthetic administered 20 gauge catheter was inserted into left femoral artery using the Seldinger technique.  Evaluation Blood flow good; BP tracing good. Complications: No apparent complications.   Richardson Landry Minor ACNP Maryanna Shape PCCM Pager [phone] till 3 pm If no answer page [phone] 07/11/2012, 3:00 PM   Emergency,  [place]  Daniel J. Titus Mould, MD, [place] Pgr: [facility]

## 2012-07-11 NOTE — ED Notes (Addendum)
Pt sedated at the time. Remains on cardiac monitor. Vital signs stable at present. HOLDING on Artic Sun protocol per Dr. Audie Pinto until CCM is consulted.

## 2012-07-11 NOTE — ED Notes (Signed)
Assumed care from [place], [place].

## 2012-07-11 NOTE — ED Notes (Signed)
Artic Sun pads in place. Pt paralyzed and sedated. Remains on cardiac monitor and zoll pads. To be transported to ICU.

## 2012-07-11 NOTE — ED Notes (Signed)
CCM at the bedside to place central line. Pt tolerating without difficulty. Remains sedated on ventilator. Waiting on bed placement in ICU.

## 2012-07-11 NOTE — ED Notes (Signed)
Pt undressed, on monitor, continuous pulse oximetry and blood pressure cuff; pt already intubated by [facility]; cannot use left arm for dialysis graft

## 2012-07-11 NOTE — Procedures (Signed)
I was present at this session.  I have reviewed the session itself and made appropriate changes.  Pressors ^ for low bp, close to 4 liters off to help O2.  T 33.6 C  Bluford Sedler L 10/22/20137:44 PM

## 2012-07-11 NOTE — H&P (Signed)
Name: [name] MRN: [id number] DOB: 10/30/1960    LOS: 0  Referring Provider:  EDP Reason for Referral: PEA arrest  PULMONARY / CRITICAL CARE MEDICINE  HPI:  51 yo AAM who was receiving HD 10-22. Developed agonal breathing. 911 activated and EMS found her in PEA arrest. Cpr and epi with return of pulses. Greater 6 min cpr time likely. Unresponsive. Tx to [facility] ED and PCCM asked to evaluate and admit. Pilar Plate pulmonary edema with refractory hypoxia treated with peep but HD is imperative. Renal and Cardiology consulted.   Past Medical History  Diagnosis Date  . Renal disorder   . Diabetes mellitus without complication   . Hypertension   . Thyroid disorder   . GERD (gastroesophageal reflux disease)   . Neuropathy    History reviewed. No pertinent past surgical history. Prior to Admission medications   Not on File   Allergies No Known Allergies  Family History History reviewed. No pertinent family history. Social History  does not have a smoking history on file. She does not have any smokeless tobacco history on file. Her alcohol and drug histories not on file.  Review Of Systems:  na  Brief patient description:   MO AAF on vent Events Since Admission: 10/22 PEA arrrest 10/22- refractory hypoxia  Current Status:  Vital Signs: Pulse Rate:  [84-117] 84  (10/22 1219) Resp:  [16] 16  (10/22 1219) BP: (171)/(88) 171/88 mmHg (10/22 1219) SpO2:  [92 %-100 %] 92 % (10/22 1357) FiO2 (%):  [100 %] 100 % (10/22 1357) Weight:  [72.258 kg (159 lb 4.8 oz)] 72.258 kg (159 lb 4.8 oz) (10/22 1100)  Physical Examination: General: unresponsive, post arrest Neuro:  Sedated , PERL HEENT:  No LAN Neck:  + JVD major Cardiovascular:  HSR RRR Lungs:  Wheezes and rales with frank pulmonary edema, coarse  Abdomen:  obese Musculoskeletal:  intact Skin:  cool  Active Problems:  Cardiac arrest  ESRD (end stage renal disease)  HTN (hypertension)  Obesity  DM (diabetes  mellitus)    ASSESSMENT AND PLAN  PULMONARY No results found for this basename: PHART:5,PCO2:5,PCO2ART:5,PO2ART:5,HCO3:5,O2SAT:5 in the last 168 hours Ventilator Settings: Vent Mode:  [-] PRVC FiO2 (%):  [100 %] 100 % Set Rate:  [16 bmp-26 bmp] 26 bmp Vt Set:  [460 mL-490 mL] 460 mL PEEP:  [5 cmH20-16 cmH20] 16 cmH20 Plateau Pressure:  [39 cmH20-42 cmH20] 42 cmH20  ETT:  10-22>>  A:  Post PEA arrest with pulmonary edema, Refractory hypoxia, Acute resp failure P:   pcxr c/w edema Multiple attempts PRVC, PCV failed Re attempted peep 22, 100% prvc, may require aprv vs [place] Need volume off Maintain current MV, repeat abg As we approach  Goal temp, will need less MV pcxr in am  I adjusted ETT out in ED  CARDIOVASCULAR  Lab 07/11/12 1207  TROPONINI --  LATICACIDVEN 8.1*  PROBNP --   ECG:  SINUS RHYTHM ~ normal P axis, V-rate 50- 99 BORDERLINE PROLONGED QT INTERVAL ~ QTc >459mS Lines:  10-22 lt fem a line>> 10-22 rt fem one port cvl>> 10-22 lt ij cvl>>  A: Post PEA arresst, r/o cardiogenic pulm edema, HTN emergency related likely P:  -Hypothermia protocol, see neuro -MAP goals 80-85 -ECG, cardiology eval, trop neg x 1, repeat -echo assessment official for pericardial effusion -I did unofficial echo emergent- small effusion, LV fxn down, rv fxn seems kinetic not dfilated, difficult images -NO ntg as would increase ICP  RENAL  Lab 07/11/12 1207 07/11/12  1154  NA 134* 136  K 4.6 4.5  CL 93* 100  CO2 19 --  BUN 42* 44*  CREATININE 7.95* 6.40*  CALCIUM 10.4 --  MG -- --  PHOS -- --   Intake/Output    None    Foley: none  A:  ESRD, pulm edema , unable to oxygenate, art risk hypokalemia secondary to shifts from hypothermia P:   -Stat HD 10-22 -appreciate renal support -hypothermia, may have some hypokalemia, bmet q4h -kvo  GASTROINTESTINAL  Lab 07/11/12 1207  AST 46*  ALT 26  ALKPHOS 114  BILITOT 0.4  PROT 6.6  ALBUMIN 3.0*    A:  At risk  gastric ulcer P:  ppi Npo Follow lft in am for shock liver  HEMATOLOGIC  Lab 07/11/12 1207 07/11/12 1154  HGB 9.3* 11.9*  HCT 30.9* 35.0*  PLT 154 --  INR -- --  APTT -- --   A: Anemia, no bleeding noted P:  -monitor cbc and tx for< 7.0 scd  INFECTIOUS  Lab 07/11/12 1207  WBC 12.0*  PROCALCITON --   Cultures: 10-22 bc>> 10-22 sputum>> Antibiotics: Ceftazx 10-22>> Vanc  10-22>>  A:  Empirical abx r/o bacteremia on HD P:   See flow bc sputum  ENDOCRINE  Lab 07/11/12 1344  GLUCAP 156*   A:  DM  P:   -SSI  NEUROLOGIC  A:  AMS post arrest, high risk anoxia P:   -stat ct head if able -hypothermia protocol, low clinical suspicion head bleed -paralysys, fent -change versed to propofol -to goal 32-34 in 120 min    BEST PRACTICE / DISPOSITION Level of Care:  ICU Primary Service:  PCCM Consultants:  Renal(schertz)/Cards Harwani Code Status:  full Diet:  npo DVT Px:  scd GI Px:  ppi Skin Integrity:  intact Social / Family:  none  Ccm time total 120 min   Lavon Paganini. Titus Mould, MD, [place] Pgr: [facility]

## 2012-07-11 NOTE — Consult Note (Signed)
Reason for Consult: PEA  cardiac arrest Referring Physician: CCM  [name] is an 51 y.o. female.  HPI: Patient is 51 year old female with past medical history significant for hypertension, diabetes mellitus, end-stage renal disease on hemodialysis, GERD, hypothyroidism, morbid obesity, neuropathy, and PEA cardiac arrest during hemodialysis requiring CPR for 5-10 minutes prior to arrival of EMS. Patient arrived at dialysis center dyspneic and while on hemodialysis for 10-15 minutes suddenly collapsed. Patient was intubated on the field and was brought to the ER she. EKG done in the ER showed normal sinus rhythm with no significant acute ischemic changes patient presently intubated sedated and on paralytics when getting emergency hemodialysis. Patient was noted to be in acute pulmonary edema. Review of chart showed no complaints of prior chest pain prior to collapse.  Past Medical History  Diagnosis Date  . Renal disorder   . Diabetes mellitus without complication   . Hypertension   . Thyroid disorder   . GERD (gastroesophageal reflux disease)   . Neuropathy     History reviewed. No pertinent past surgical history.  History reviewed. No pertinent family history.  Social History:  does not have a smoking history on file. She does not have any smokeless tobacco history on file. Her alcohol and drug histories not on file.  Allergies: No Known Allergies  Medications: I have reviewed the patient's current medications.  Results for orders placed during the hospital encounter of 07/11/12 (from the past 48 hour(s))  POCT I-STAT, CHEM 8     Status: Abnormal   Collection Time   07/11/12 11:54 AM      Component Value Range Comment   Sodium 136  135 - 145 mEq/L    Potassium 4.5  3.5 - 5.1 mEq/L    Chloride 100  96 - 112 mEq/L    BUN 44 (*) 6 - 23 mg/dL    Creatinine, Ser 6.40 (*) 0.50 - 1.10 mg/dL    Glucose, Bld 199 (*) 70 - 99 mg/dL    Calcium, Ion 1.27 (*) 1.12 - 1.23 mmol/L    TCO2  23  0 - 100 mmol/L    Hemoglobin 11.9 (*) 12.0 - 15.0 g/dL    HCT 35.0 (*) 36.0 - 46.0 %   CBC WITH DIFFERENTIAL     Status: Abnormal   Collection Time   07/11/12 12:07 PM      Component Value Range Comment   WBC 12.0 (*) 4.0 - 10.5 K/uL    RBC 4.02  3.87 - 5.11 MIL/uL    Hemoglobin 9.3 (*) 12.0 - 15.0 g/dL REPEATED TO VERIFY   HCT 30.9 (*) 36.0 - 46.0 %    MCV 76.9 (*) 78.0 - 100.0 fL    MCH 23.1 (*) 26.0 - 34.0 pg    MCHC 30.1  30.0 - 36.0 g/dL    RDW 21.0 (*) 11.5 - 15.5 %    Platelets 154  150 - 400 K/uL    Neutrophils Relative 50  43 - 77 %    Neutro Abs 6.0  1.7 - 7.7 K/uL    Lymphocytes Relative 43  12 - 46 %    Lymphs Abs 5.1 (*) 0.7 - 4.0 K/uL    Monocytes Relative 4  3 - 12 %    Monocytes Absolute 0.4  0.1 - 1.0 K/uL    Eosinophils Relative 3  0 - 5 %    Eosinophils Absolute 0.4  0.0 - 0.7 K/uL    Basophils Relative 0  0 -  1 %    Basophils Absolute 0.0  0.0 - 0.1 K/uL   COMPREHENSIVE METABOLIC PANEL     Status: Abnormal   Collection Time   07/11/12 12:07 PM      Component Value Range Comment   Sodium 134 (*) 135 - 145 mEq/L    Potassium 4.6  3.5 - 5.1 mEq/L    Chloride 93 (*) 96 - 112 mEq/L    CO2 19  19 - 32 mEq/L    Glucose, Bld 215 (*) 70 - 99 mg/dL    BUN 42 (*) 6 - 23 mg/dL    Creatinine, Ser 7.95 (*) 0.50 - 1.10 mg/dL    Calcium 10.4  8.4 - 10.5 mg/dL    Total Protein 6.6  6.0 - 8.3 g/dL    Albumin 3.0 (*) 3.5 - 5.2 g/dL    AST 46 (*) 0 - 37 U/L    ALT 26  0 - 35 U/L    Alkaline Phosphatase 114  39 - 117 U/L    Total Bilirubin 0.4  0.3 - 1.2 mg/dL    GFR calc non Af Amer 5 (*) >90 mL/min    GFR calc Af Amer 6 (*) >90 mL/min   LACTIC ACID, PLASMA     Status: Abnormal   Collection Time   07/11/12 12:07 PM      Component Value Range Comment   Lactic Acid, Venous 8.1 (*) 0.5 - 2.2 mmol/L   POCT I-STAT TROPONIN I     Status: Normal   Collection Time   07/11/12 12:19 PM      Component Value Range Comment   Troponin i, poc 0.03  0.00 - 0.08 ng/mL     Comment 3            POCT I-STAT 3, BLOOD GAS (G3+)     Status: Abnormal   Collection Time   07/11/12  1:21 PM      Component Value Range Comment   pH, Arterial 7.297 (*) 7.350 - 7.450    pCO2 arterial 52.4 (*) 35.0 - 45.0 mmHg    pO2, Arterial 51.0 (*) 80.0 - 100.0 mmHg    Bicarbonate 25.6 (*) 20.0 - 24.0 mEq/L    TCO2 27  0 - 100 mmol/L    O2 Saturation 82.0      Acid-base deficit 1.0  0.0 - 2.0 mmol/L    Sample type ARTERIAL     GLUCOSE, CAPILLARY     Status: Abnormal   Collection Time   07/11/12  1:44 PM      Component Value Range Comment   Glucose-Capillary 156 (*) 70 - 99 mg/dL   GLUCOSE, CAPILLARY     Status: Abnormal   Collection Time   07/11/12  2:38 PM      Component Value Range Comment   Glucose-Capillary 120 (*) 70 - 99 mg/dL   POCT I-STAT 3, BLOOD GAS (G3+)     Status: Abnormal   Collection Time   07/11/12  2:47 PM      Component Value Range Comment   pH, Arterial 7.452 (*) 7.350 - 7.450    pCO2 arterial 34.1 (*) 35.0 - 45.0 mmHg    pO2, Arterial 211.0 (*) 80.0 - 100.0 mmHg    Bicarbonate 24.3 (*) 20.0 - 24.0 mEq/L    TCO2 25  0 - 100 mmol/L    O2 Saturation 100.0      Patient temperature 34.9 C      Collection site ARTERIAL LINE  Sample type ARTERIAL     POCT I-STAT, CHEM 8     Status: Abnormal   Collection Time   07/11/12  2:57 PM      Component Value Range Comment   Sodium 140  135 - 145 mEq/L    Potassium 5.0  3.5 - 5.1 mEq/L    Chloride 106  96 - 112 mEq/L    BUN 41 (*) 6 - 23 mg/dL    Creatinine, Ser 7.00 (*) 0.50 - 1.10 mg/dL    Glucose, Bld 149 (*) 70 - 99 mg/dL    Calcium, Ion 1.11 (*) 1.12 - 1.23 mmol/L    TCO2 21  0 - 100 mmol/L    Hemoglobin 8.2 (*) 12.0 - 15.0 g/dL    HCT 24.0 (*) 36.0 - 46.0 %   PROTIME-INR     Status: Abnormal   Collection Time   07/11/12  3:12 PM      Component Value Range Comment   Prothrombin Time 16.8 (*) 11.6 - 15.2 seconds    INR 1.40  0.00 - 1.49   APTT     Status: Abnormal   Collection Time   07/11/12   3:12 PM      Component Value Range Comment   aPTT 55 (*) 24 - 37 seconds   GLUCOSE, CAPILLARY     Status: Abnormal   Collection Time   07/11/12  4:02 PM      Component Value Range Comment   Glucose-Capillary 143 (*) 70 - 99 mg/dL   BASIC METABOLIC PANEL     Status: Abnormal   Collection Time   07/11/12  4:54 PM      Component Value Range Comment   Sodium 135  135 - 145 mEq/L    Potassium 5.8 (*) 3.5 - 5.1 mEq/L    Chloride 97  96 - 112 mEq/L    CO2 21  19 - 32 mEq/L    Glucose, Bld 152 (*) 70 - 99 mg/dL    BUN 49 (*) 6 - 23 mg/dL    Creatinine, Ser 8.22 (*) 0.50 - 1.10 mg/dL    Calcium 9.1  8.4 - 10.5 mg/dL    GFR calc non Af Amer 5 (*) >90 mL/min    GFR calc Af Amer 6 (*) >90 mL/min   POCT I-STAT, CHEM 8     Status: Abnormal   Collection Time   07/11/12  5:06 PM      Component Value Range Comment   Sodium 141  135 - 145 mEq/L    Potassium 3.7  3.5 - 5.1 mEq/L    Chloride 102  96 - 112 mEq/L    BUN 19  6 - 23 mg/dL    Creatinine, Ser 3.00 (*) 0.50 - 1.10 mg/dL    Glucose, Bld 142 (*) 70 - 99 mg/dL    Calcium, Ion 1.15  1.12 - 1.23 mmol/L    TCO2 26  0 - 100 mmol/L    Hemoglobin 12.2  12.0 - 15.0 g/dL    HCT 36.0  36.0 - 46.0 %     Dg Chest Portable 1 View  07/11/2012  *RADIOLOGY REPORT*  Clinical Data: Cardiac arrest, line placement  PORTABLE CHEST - 1 VIEW  Comparison: Portable chest x-ray of 07/11/2012  Findings: There has been worsening of diffuse airspace disease most consistent with edema.  The tip of endotracheal tube is approximately 2.5 cm above the carina.  The left IJ central venous line is present with the  tip overlying the upper SVC.  No pneumothorax is noted.  Cardiomegaly is stable. NG tube coils in the stomach.  IMPRESSION:  1.  Left IJ central venous line tip overlies the upper SVC.  No pneumothorax. 2.  Worsening of airspace disease most consistent with edema. 3.  Tip of endotracheal tube 2.5 cm above the carina.   Original Report Authenticated By: Joretta Bachelor, M.D.    Dg Chest Portable 1 View  07/11/2012  *RADIOLOGY REPORT*  Clinical Data: ETT placement  PORTABLE CHEST - 1 VIEW  Comparison: None.  Findings: Preferential intubation of the right mainstem bronchus. Withdrawal approximately 4 cm is suggested.  Patchy bilateral perihilar opacities, right greater than left, suspicious for multifocal infection, less likely edema or pulmonary hemorrhage.  No pleural effusion or pneumothorax.  The heart is top normal in size.  Stent graft to the left axilla.  IMPRESSION: Preferential intubation of the right mainstem bronchus.  Withdrawal approximately 4 cm is suggested.  Patchy bilateral perihilar opacities, right greater than left, suspicious for multifocal infection.   Original Report Authenticated By: Julian Hy, M.D.     Review of Systems  Unable to perform ROS: intubated   Blood pressure 98/45, pulse 63, temperature 94.5 F (34.7 C), resp. rate 25, height 5\' 7"  (1.702 m), weight 90 kg (198 lb 6.6 oz), SpO2 94.00%. Physical Exam  Eyes: Conjunctivae normal are normal. Pupils are equal, round, and reactive to light.  Neck: Normal range of motion. Neck supple. JVD present.  Cardiovascular: Normal rate and regular rhythm.        Soft systolic murmur and S3 gallop noted no pericardial rub  Respiratory:       Decreased breath sound at bases with bibasilar rales  GI: Soft. Bowel sounds are normal. She exhibits distension.  Musculoskeletal:       No clubbing cyanosis 1+ edema noted    Assessment/Plan: Status post PE a cardiac arrest  Acute pulmonary edema rule out ischemia rule out MI Acute respiratory failure secondary to above rule out aspiration Hypertension Diabetes matters Morbid obesity End-stage renal disease on hemodialysis Hypothyroidism GERD Diabetic neuropathy Hypoxic encephalopathy  Plan Continue present management as per CCM Check serial enzymes and EKG Agree with hemodialysis Check 2-D echo Continue cooling  protocol Will discuss regarding further management from cardiac point of view once extubated and able to get more history from patient.  Deedee Lybarger N 07/11/2012, 5:55 PM

## 2012-07-11 NOTE — Consult Note (Signed)
[name] 07/11/2012 [place] Requesting Physician:  Dr. Titus Mould  Reason for Consult:  Need dialysis in ESRD pt with acute resp failure and cardioresp arrest today.  HPI: The patient is a 51 y.o. year-old with ESRD on HD at [facility]. Has hx of DM, HTN, hypothyroidism, neuropathy.  Patient arrived to outpatient HD today severely dyspneic. EMS was called on arrival, and attempt to dialyze patient was made while awaiting ambulance. She got about 15 min of HD, then arrested as EMS was arriving. She was intubated and required CPR for 5-10 min.  Xray on arrival shows diffuse and severe bilat edema pattern. Pulm MD says they cannot oxygenate her. Patient unable to provide any history  Weight pre HD today was 88kg, dry weight is 84kg.   ROS Unable to provide   Past Medical History:  Past Medical History  Diagnosis Date  . Renal disorder   . Diabetes mellitus without complication   . Hypertension   . Thyroid disorder   . GERD (gastroesophageal reflux disease)   . Neuropathy     Past Surgical History: History reviewed. No pertinent past surgical history.  Family History: History reviewed. No pertinent family history. Social History:  does not have a smoking history on file. She does not have any smokeless tobacco history on file. Her alcohol and drug histories not on file.  Allergies: No Known Allergies  Home medications: Prior to Admission medications   Not on File    Inpatient medications:    . sodium chloride  2,000 mL Intravenous Once  . albuterol  5 mg Nebulization Once  . artificial tears  1 application Both Eyes [id number]  . aspirin  300 mg Rectal NOW  . cisatracurium  0.1 mg/kg (Order-Specific) Intravenous Once  . diazepam      . fentaNYL  100 mcg Intravenous Once  . heparin subcutaneous  5,000 Units Subcutaneous Q8H  . insulin aspart  0-9 Units Subcutaneous Q4H  . ipratropium  0.5 mg Nebulization Once  . pantoprazole sodium  40 mg Per Tube Q1200  . sodium  chloride  2,000 mL Intravenous Once  . DISCONTD: cisatracurium  0.1 mg/kg Intravenous Once  . DISCONTD: cisatracurium  0.1 mg/kg Intravenous Once     Labs: Basic Metabolic Panel:  Lab [id number] 1207 07/11/12 1154  NA 134* 136  K 4.6 4.5  CL 93* 100  CO2 19 --  GLUCOSE 215* 199*  BUN 42* 44*  CREATININE 7.95* 6.40*  ALB -- --  CALCIUM 10.4 --  PHOS -- --   Liver Function Tests:  Lab 07/11/12 1207  AST 46*  ALT 26  ALKPHOS 114  BILITOT 0.4  PROT 6.6  ALBUMIN 3.0*   No results found for this basename: LIPASE:3,AMYLASE:3 in the last 168 hours No results found for this basename: AMMONIA:3 in the last 168 hours CBC:  Lab 07/11/12 1207 07/11/12 1154  WBC 12.0* --  NEUTROABS 6.0 --  HGB 9.3* 11.9*  HCT 30.9* 35.0*  MCV 76.9* --  PLT 154 --   PT/INR: @labrcntip (inr:5) Cardiac Enzymes: No results found for this basename: CKTOTAL:5,CKMB:5,CKMBINDEX:5,TROPONINI:5 in the last 168 hours CBG:  Lab 07/11/12 1344  GLUCAP 156*    Iron Studies: No results found for this basename: IRON:30,TIBC:30,TRANSFERRIN:30,FERRITIN:30 in the last 168 hours  Xrays/Other Studies: Dg Chest Portable 1 View  07/11/2012  *RADIOLOGY REPORT*  Clinical Data: Cardiac arrest, line placement  PORTABLE CHEST - 1 VIEW  Comparison: Portable chest x-ray of 07/11/2012  Findings: There has been worsening of  diffuse airspace disease most consistent with edema.  The tip of endotracheal tube is approximately 2.5 cm above the carina.  The left IJ central venous line is present with the tip overlying the upper SVC.  No pneumothorax is noted.  Cardiomegaly is stable. NG tube coils in the stomach.  IMPRESSION:  1.  Left IJ central venous line tip overlies the upper SVC.  No pneumothorax. 2.  Worsening of airspace disease most consistent with edema. 3.  Tip of endotracheal tube 2.5 cm above the carina.   Original Report Authenticated By: Joretta Bachelor, M.D.    Dg Chest Portable 1 View  07/11/2012  *RADIOLOGY  REPORT*  Clinical Data: ETT placement  PORTABLE CHEST - 1 VIEW  Comparison: None.  Findings: Preferential intubation of the right mainstem bronchus. Withdrawal approximately 4 cm is suggested.  Patchy bilateral perihilar opacities, right greater than left, suspicious for multifocal infection, less likely edema or pulmonary hemorrhage.  No pleural effusion or pneumothorax.  The heart is top normal in size.  Stent graft to the left axilla.  IMPRESSION: Preferential intubation of the right mainstem bronchus.  Withdrawal approximately 4 cm is suggested.  Patchy bilateral perihilar opacities, right greater than left, suspicious for multifocal infection.   Original Report Authenticated By: Julian Hy, M.D.     Physical Exam:  Blood pressure 171/88, pulse 84, resp. rate 16, height 5\' 7"  (1.702 m), weight 72.258 kg (159 lb 4.8 oz), SpO2 92.00%.  Gen: intubated, unresponsive, on vent Skin: no rash, cyanosis HEENT:  Pupils 25mm and symmetric, sclera anicteric Neck: no visible JVD, no bruits or LAN Chest: clear bilat, no rales or wheezing Heart: regular, no rub or gallop, no murmur noted Abdomen: soft, obese, nondistended Ext: 1-2+ pitting pretibial edema bilat, no thigh or UE edema. Good bruit over LUA AVF. Tape in place. Neuro: Unresponsive, sedated on vent  Outpatient HD: [facility], EDW 84kg, 4 hrs, 2.0K/2.25Ca, LUA AVF. Heparin 8400, EPO 16k, Venofer 50 mg/thurs, Zemplar 9ug. Optiflux 160, BFR450, Qd 1.5x.     Impression/Plan 1. Acute respiratory failure- diffuse pulm edema on xray and vol overload on exam (LE pitting edema).  Needs acute HD, unable to oxygenate per CCM.  Plan acute HD, max UF as tolerated.  2. S/P cardiorespiratory arrest- for cooling protocol 3. CKD, hd mwf via LUA AVF.  Dr Arty Baumgartner thinks EDW needs to be lowered around 80 kg. Try to dialyze without heparin if possible today (has not had head CT yet).  4. Volume overload- see above 5. HTN- takes nifedipine 60bid, clonidine  0.2bid, metoprolol 100/d at home.  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 DM2- on insulin, per primary 8. Hypothyroidism   Kelly Splinter  MD [facility] [phone] pgr    [phone] cell 07/11/2012, 2:41 PM

## 2012-07-11 NOTE — Procedures (Signed)
Central Venous Catheter Insertion Procedure Note Alexxys Walstrom [id number] May 26, 1961  Procedure: Insertion of Central Venous Catheter Indications: Assessment of intravascular volume, Drug and/or fluid administration and Frequent blood sampling  Procedure Details Consent:Unable to obtain due to AMS. Time Out: Verified patient identification, verified procedure, site/side was marked, verified correct patient position, special equipment/implants available, medications/allergies/relevent history reviewed, required imaging and test results available.  Performed  Maximum sterile technique was used including antiseptics, cap, gloves, gown, hand hygiene, mask and sheet. Skin prep: Chlorhexidine; local anesthetic administered A antimicrobial bonded/coated triple lumen catheter was placed in the left internal jugular vein using the Seldinger technique. Ultrasound guidance used.yes Catheter placed to 20 cm. Blood aspirated via all 3 ports and then flushed x 3. Line sutured x 2 and dressing applied.  Evaluation Blood flow good Complications: No apparent complications Patient did tolerate procedure well. Chest X-ray ordered to verify placement.  CXR: normal.  Richardson Landry Minor ACNP Maryanna Shape PCCM Pager [phone] till 3 pm If no answer page [phone] 07/11/2012, 2:59 PM   [place] emergency need, access,   Lavon Paganini. Titus Mould, MD, [place] Pgr: [facility]

## 2012-07-12 ENCOUNTER — Encounter (HOSPITAL_COMMUNITY): Payer: Self-pay

## 2012-07-12 ENCOUNTER — Ambulatory Visit: Payer: Medicare Other | Admitting: Cardiovascular Disease

## 2012-07-12 ENCOUNTER — Inpatient Hospital Stay (HOSPITAL_COMMUNITY): Payer: Medicare Other

## 2012-07-12 DIAGNOSIS — N186 End stage renal disease: Secondary | ICD-10-CM

## 2012-07-12 DIAGNOSIS — J9600 Acute respiratory failure, unspecified whether with hypoxia or hypercapnia: Secondary | ICD-10-CM

## 2012-07-12 DIAGNOSIS — I469 Cardiac arrest, cause unspecified: Secondary | ICD-10-CM

## 2012-07-12 LAB — BASIC METABOLIC PANEL
BUN: 36 mg/dL — ABNORMAL HIGH (ref 6–23)
CO2: 26 mEq/L (ref 19–32)
Calcium: 8 mg/dL — ABNORMAL LOW (ref 8.4–10.5)
Chloride: 97 mEq/L (ref 96–112)
Creatinine, Ser: 5.52 mg/dL — ABNORMAL HIGH (ref 0.50–1.10)
GFR calc Af Amer: 10 mL/min — ABNORMAL LOW (ref >90)
GFR calc Af Amer: 9 mL/min — ABNORMAL LOW (ref >90)
GFR calc Af Amer: 17 mL/min — ABNORMAL LOW (ref >90)
GFR calc non Af Amer: 9 mL/min — ABNORMAL LOW (ref >90)
GFR calc non Af Amer: 21 mL/min — ABNORMAL LOW (ref >90)
GFR calc non Af Amer: 15 mL/min — ABNORMAL LOW (ref >90)
Potassium: 4.4 mEq/L (ref 3.5–5.1)
Potassium: 3.3 mEq/L — ABNORMAL LOW (ref 3.5–5.1)
Sodium: 136 mEq/L (ref 135–145)
Sodium: 137 mEq/L (ref 135–145)
Sodium: 141 mEq/L (ref 135–145)

## 2012-07-12 LAB — CK TOTAL AND CKMB (NOT AT ARMC)
CK, MB: 4.5 ng/mL — ABNORMAL HIGH (ref 0.3–4.0)
CK, MB: 4.8 ng/mL — ABNORMAL HIGH (ref 0.3–4.0)
Relative Index: INVALID (ref 0.0–2.5)
Relative Index: INVALID (ref 0.0–2.5)

## 2012-07-12 LAB — POCT I-STAT, CHEM 8
Chloride: 98 mEq/L (ref 96–112)
HCT: 30 % — ABNORMAL LOW (ref 36.0–46.0)
Potassium: 3.9 mEq/L (ref 3.5–5.1)

## 2012-07-12 LAB — URINALYSIS, ROUTINE W REFLEX MICROSCOPIC
Bilirubin Urine: NEGATIVE
Glucose, UA: 100 mg/dL — AB
Ketones, ur: 15 mg/dL — AB
pH: 8 (ref 5.0–8.0)

## 2012-07-12 LAB — URINE MICROSCOPIC-ADD ON

## 2012-07-12 LAB — GLUCOSE, CAPILLARY
Glucose-Capillary: 120 mg/dL — ABNORMAL HIGH (ref 70–99)
Glucose-Capillary: 125 mg/dL — ABNORMAL HIGH (ref 70–99)
Glucose-Capillary: 127 mg/dL — ABNORMAL HIGH (ref 70–99)
Glucose-Capillary: 130 mg/dL — ABNORMAL HIGH (ref 70–99)
Glucose-Capillary: 150 mg/dL — ABNORMAL HIGH (ref 70–99)

## 2012-07-12 LAB — POCT I-STAT 3, ART BLOOD GAS (G3+)
Bicarbonate: 28 mEq/L — ABNORMAL HIGH (ref 20.0–24.0)
pH, Arterial: 7.416 (ref 7.350–7.450)
pO2, Arterial: 82 mmHg (ref 80.0–100.0)

## 2012-07-12 LAB — TROPONIN I
Troponin I: 0.62 ng/mL (ref <0.30)
Troponin I: 0.33 ng/mL (ref <0.30)
Troponin I: <0.3 ng/mL (ref <0.30)

## 2012-07-12 MED ORDER — LIDOCAINE HCL (PF) 1 % IJ SOLN: 5.0000 mL | INTRAMUSCULAR | Status: DC | PRN

## 2012-07-12 MED ORDER — PENTAFLUOROPROP-TETRAFLUOROETH EX AERO: 1.0000 "application " | INHALATION_SPRAY | CUTANEOUS | Status: DC | PRN

## 2012-07-12 MED ORDER — SODIUM CHLORIDE 0.9 % IV SOLN: 100.0000 mL | INTRAVENOUS | Status: DC | PRN

## 2012-07-12 MED ORDER — DEXTROSE 5 % IV SOLN
2.0000 g | INTRAVENOUS | Status: DC | PRN
  Filled 2012-07-12: qty 2

## 2012-07-12 MED ORDER — VANCOMYCIN HCL IN DEXTROSE 1-5 GM/200ML-% IV SOLN
1000.0000 mg | INTRAVENOUS | Status: DC | PRN
  Administered 2012-07-12: 1000 mg via INTRAVENOUS
  Filled 2012-07-12 (×2): qty 200

## 2012-07-12 MED ORDER — LIDOCAINE-PRILOCAINE 2.5-2.5 % EX CREA
1.0000 "application " | TOPICAL_CREAM | CUTANEOUS | Status: DC | PRN
  Filled 2012-07-12: qty 5

## 2012-07-12 MED ORDER — ALTEPLASE 2 MG IJ SOLR
2.0000 mg | Freq: Once | INTRAMUSCULAR | Status: AC | PRN
  Filled 2012-07-12: qty 2

## 2012-07-12 MED ORDER — NEPRO/CARBSTEADY PO LIQD
237.0000 mL | ORAL | Status: DC | PRN
  Filled 2012-07-12: qty 237

## 2012-07-12 MED ORDER — INFLUENZA VIRUS VACC SPLIT PF IM SUSP
0.5000 mL | INTRAMUSCULAR | Status: AC
  Filled 2012-07-12: qty 0.5

## 2012-07-12 MED ORDER — HYDRALAZINE HCL 20 MG/ML IJ SOLN
10.0000 mg | INTRAMUSCULAR | Status: DC | PRN
  Administered 2012-07-12: 10 mg via INTRAVENOUS
  Administered 2012-07-15: 20 mg via INTRAVENOUS
  Filled 2012-07-12 (×2): qty 1

## 2012-07-12 MED ORDER — HEPARIN SODIUM (PORCINE) 1000 UNIT/ML DIALYSIS
1000.0000 [IU] | INTRAMUSCULAR | Status: DC | PRN
  Filled 2012-07-12: qty 1

## 2012-07-12 NOTE — Progress Notes (Signed)
CRITICAL VALUE ALERT  Critical value received:  Trop 0.57  Date of notification:  07/12/12  Time of notification:  1911  Critical value read back:yes  Nurse who received alert:  Melene Plan RN  MD notified (1st page):  Dr Pennie Banter  Time of first page:  [phone]  MD notified (2nd page):  Time of second page:  Responding MD:  Dr Pennie Banter  Time MD responded:  [phone]

## 2012-07-12 NOTE — Progress Notes (Signed)
Subjective:  Patient is intubated sedated. 2-D echo being done now which showed good LV systolic function with no significant wall motion abnormalities and no pericardial effusion. Full 2-D echo report to follow  Objective:  Vital Signs in the last 24 hours: Temp:  [85.8 F (29.9 C)-94.8 F (34.9 C)] 90.9 F (32.7 C) (10/23 1200) Pulse Rate:  [44-84] 53  (10/23 1200) Resp:  [12-26] 12  (10/23 1200) BP: (81-185)/(6-99) 185/79 mmHg (10/23 1200) SpO2:  [92 %-100 %] 99 % (10/23 1200) Arterial Line BP: (86-214)/(43-88) 214/80 mmHg (10/23 1200) FiO2 (%):  [40 %-100 %] 40 % (10/23 1200) Weight:  [88.33 kg (194 lb 11.7 oz)-90.23 kg (198 lb 14.7 oz)] 88.33 kg (194 lb 11.7 oz) (10/22 2000)  Intake/Output from previous day: 10/22 0701 - 10/23 0700 In: 1336.8 [I.V.:1036.8; IV Piggyback:300] Out: 3683 [Urine:206; Emesis/NG output:200] Intake/Output from this shift: Total I/O In: 339.3 [I.V.:329.3; IV Piggyback:10] Out: 30 [Urine:30]  Physical Exam: Neck: no adenopathy, no carotid bruit, no JVD and supple, symmetrical, trachea midline Lungs: Decreased breath sound at bases with mother slurred rhonchi and rales Heart: regular rate and rhythm, S1, S2 normal and Soft systolic murmur no friction rub Abdomen: soft, non-tender; bowel sounds normal; no masses,  no organomegaly Extremities: extremities normal, atraumatic, no cyanosis or edema  Lab Results:  Basename 07/12/12 0352 07/11/12 2249 07/11/12 1207  WBC -- -- 12.0*  HGB 10.2* 10.2* --  PLT -- -- 154    Basename 07/12/12 0920 07/12/12 0352 07/11/12 1909  NA 136 139 --  K 4.4 3.9 --  CL 97 98 --  CO2 24 -- 22  GLUCOSE 129* 120* --  BUN 36* 32* --  CREATININE 5.28* 4.80* --    Basename 07/12/12 0607 07/12/12 0030  TROPONINI 0.62* 0.77*   Hepatic Function Panel  Basename 07/11/12 1207  PROT 6.6  ALBUMIN 3.0*  AST 46*  ALT 26  ALKPHOS 114  BILITOT 0.4  BILIDIR --  IBILI --   No results found for this basename: CHOL in  the last 72 hours No results found for this basename: PROTIME in the last 72 hours  Imaging: Imaging results have been reviewed and Dg Chest Port 1 View  07/12/2012  *RADIOLOGY REPORT*  Clinical Data: Follow up pulmonary edema after dialysis.  PORTABLE CHEST - 1 VIEW  Comparison: 1 day prior  Findings: Endotracheal tube 1.6 cm above carina.  Left IJ central line unchanged; tip mid SVC.  Nasogastric tube extends beyond the inferior aspect of the film.  Numerous leads and wires project over the chest.  There is extensive artifact over the left hemithorax as well.  Cardiomegaly accentuated by AP portable technique.  New or increased small bilateral pleural effusions. No pneumothorax. Lower lobe predominant interstitial and airspace disease is improved.  IMPRESSION:  1.  Improvement in congestive heart failure. 2.  New or enlarged bilateral pleural effusions. 3.  Endotracheal tube borderline low position; 1.6 cm above carina. Consider retraction 2 cm versus attention on follow-up.   Original Report Authenticated By: Areta Haber, M.D.    Dg Chest Portable 1 View  07/11/2012  *RADIOLOGY REPORT*  Clinical Data: Cardiac arrest, line placement  PORTABLE CHEST - 1 VIEW  Comparison: Portable chest x-ray of 07/11/2012  Findings: There has been worsening of diffuse airspace disease most consistent with edema.  The tip of endotracheal tube is approximately 2.5 cm above the carina.  The left IJ central venous line is present with the tip overlying the upper  SVC.  No pneumothorax is noted.  Cardiomegaly is stable. NG tube coils in the stomach.  IMPRESSION:  1.  Left IJ central venous line tip overlies the upper SVC.  No pneumothorax. 2.  Worsening of airspace disease most consistent with edema. 3.  Tip of endotracheal tube 2.5 cm above the carina.   Original Report Authenticated By: Joretta Bachelor, M.D.    Dg Chest Portable 1 View  07/11/2012  *RADIOLOGY REPORT*  Clinical Data: ETT placement  PORTABLE CHEST - 1 VIEW   Comparison: None.  Findings: Preferential intubation of the right mainstem bronchus. Withdrawal approximately 4 cm is suggested.  Patchy bilateral perihilar opacities, right greater than left, suspicious for multifocal infection, less likely edema or pulmonary hemorrhage.  No pleural effusion or pneumothorax.  The heart is top normal in size.  Stent graft to the left axilla.  IMPRESSION: Preferential intubation of the right mainstem bronchus.  Withdrawal approximately 4 cm is suggested.  Patchy bilateral perihilar opacities, right greater than left, suspicious for multifocal infection.   Original Report Authenticated By: Julian Hy, M.D.     Cardiac Studies:  Assessment/Plan:  Status post PEA cardiac arrest  Acute pulmonary edema rule out ischemia /MI ruled out Acute respiratory failure secondary to above rule out aspiration  Hypertension  Diabetes matters  Morbid obesity  End-stage renal disease on hemodialysis  Hypothyroidism  GERD  Diabetic neuropathy  Hypoxic encephalopathy Plan Continue present management per CCM   LOS: 1 day    Davey Limas N 07/12/2012, 12:11 PM

## 2012-07-12 NOTE — Progress Notes (Signed)
Et tube 1.6cm above carina  Will pull back 1cm

## 2012-07-12 NOTE — Progress Notes (Signed)
Found pt. On current settings. Pt. Tolerating vent well at this time.

## 2012-07-12 NOTE — Progress Notes (Signed)
Subjective: Off pressors, FiO2 down to 50%, 3.5kg off w hd yesterday. CVP 9-14 today. Cxr improved edema. Levophed down to 1.5 mcg/min. Required pressor support for UF with hd yest.   Objective Vital signs in last 24 hours: Filed Vitals:   07/12/12 0737 07/12/12 0800 07/12/12 0900 07/12/12 1000  BP:      Pulse: 46 48 51 46  Temp: 91.4 F (33 C) 91.6 F (33.1 C) 92.3 F (33.5 C) 91.9 F (33.3 C)  TempSrc:  Core (Comment) Core (Comment) Core (Comment)  Resp: 12 12 12 12   Height:      Weight:      SpO2: 98% 98% 100% 100%   Weight change:   Intake/Output Summary (Last 24 hours) at 07/12/12 1011 Last data filed at 07/12/12 0900  Gross per 24 hour  Intake 1470.49 ml  Output   3713 ml  Net -2242.51 ml   Labs: Basic Metabolic Panel:  Lab [id number] 07/12/12 0352 07/11/12 2249 07/11/12 1909 07/11/12 1820 07/11/12 1706 07/11/12 1654 07/11/12 1207  NA 136 139 138 137 138 141 135 --  K 4.4 3.9 3.0* 3.2* 3.9 3.7 5.8* --  CL 97 98 100 98 99 102 97 --  CO2 24 -- -- 22 23 -- 21 19  GLUCOSE 129* 120* 151* 108* 127* 142* 152* --  BUN 36* 32* 27* 10 19 19  49* --  CREATININE 5.28* 4.80* 4.50* 1.77* 3.28* 3.00* 8.22* --  ALB -- -- -- -- -- -- -- --  CALCIUM 7.9* -- -- 8.5 9.1 -- 9.1 10.4  PHOS -- -- -- -- -- -- -- --   Liver Function Tests:  Lab 07/11/12 1207  AST 46*  ALT 26  ALKPHOS 114  BILITOT 0.4  PROT 6.6  ALBUMIN 3.0*   No results found for this basename: LIPASE:3,AMYLASE:3 in the last 168 hours No results found for this basename: AMMONIA:3 in the last 168 hours CBC:  Lab 07/12/12 0352 07/11/12 2249 07/11/12 1706 07/11/12 1457 07/11/12 1207  WBC -- -- -- -- 12.0*  NEUTROABS -- -- -- -- 6.0  HGB 10.2* 10.2* 12.2 8.2* --  HCT 30.0* 30.0* 36.0 24.0* --  MCV -- -- -- -- 76.9*  PLT -- -- -- -- 154   PT/INR: @labrcntip (inr:5) Cardiac Enzymes:  Lab 07/12/12 0607 07/12/12 0030 07/11/12 1807  CKTOTAL 86 103 104  CKMB 4.8* 4.5* 4.1*  CKMBINDEX -- -- --  TROPONINI  0.62* 0.77* 0.57*   CBG:  Lab 07/12/12 0752 07/12/12 0352 07/12/12 0028 07/11/12 2217 07/11/12 2036  GLUCAP 98 120* 130* 161* 125*    Iron Studies: No results found for this basename: IRON:30,TIBC:30,TRANSFERRIN:30,FERRITIN:30 in the last 168 hours  Physical Exam:  Blood pressure 153/63, pulse 46, temperature 91.9 F (33.3 C), temperature source Core (Comment), resp. rate 12, height 5\' 2"  (1.575 m), weight 88.33 kg (194 lb 11.7 oz), SpO2 100.00%.  Gen: intubated, unresponsive, chemically paralyzed and sedated Skin: no rash, cyanosis  HEENT: Pupils 84mm and symmetric, sclera anicteric  Neck: no visible JVD, no bruits or LAN  Chest: clear bilat, no rales or wheezing  Heart: regular, no rub or gallop, no murmur noted  Abdomen: soft, obese, nondistended  Ext: 1+ pitting pretibial edema bilat, no thigh or UE edema. Good bruit over LUA AVF.  Neuro: Unresponsive  Outpatient HD: [facility], EDW 84kg, 4 hrs, 2.0K/2.25Ca, LUA AVF. Heparin 8400, EPO 16k, Venofer 50 mg/thurs, Zemplar 9ug. Optiflux 160, BFR450, Qd 1.5x.   Impression/Plan  1. Acute respiratory  failure- 3.5kg off w hd yesterday. CXR improved edema, FiO2 down to 50%. HD again today and tomorrow, max UF as tolerated. 2. S/P cardiorespiratory arrest- on cooling protocol, will be off by tomorrow morning.  3. CKD, hd mwf via LUA AVF. Dr Arty Baumgartner thinks EDW needs to be lowered around 80 kg. Try to dialyze without heparin if possible today (has not had head CT yet).  4. Volume overload- see above 5. HTN- takes nifedipine 60/d,clonidine 0.2bid, metoprolol 100/d at home. Not getting here due to low BP's.  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 DM2- on insulin, per primary 8. Hypothyroidism   Karina Splinter  MD [facility] [phone] pgr    [phone] cell 07/12/2012, 10:11 AM

## 2012-07-12 NOTE — Progress Notes (Signed)
Pharmacy: Vancomycin + Ceftazidime  51yof s/p PEA arrest yesterday at dialysis continues on day #2 antibiotics for ? bacteremia/line infection. Loading doses of vancomycin and ceftazidime given after stat HD yesterday (tolerated 4 hours with BFR 400). Plan is for HD again today and tomorrow so will schedule maintenance doses accordingly.  10/22 Vancomycin>> 10/22 Ceftazidime>> 10/22 blood cx>>pending  Plan: 1) Vancomycin 1g IV qHD 2) Ceftazidime 2g IV qHD 3) Continue to follow renal plans, tolerance of HD  Dossie Arbour Clinical Pharmacist Pager: [phone]

## 2012-07-12 NOTE — Progress Notes (Signed)
Name: [name] MRN: [id number] DOB: [birth year]    LOS: 1  Referring Provider:  EDP Reason for Referral: PEA arrest  PULMONARY / CRITICAL CARE MEDICINE  HPI:  51 yo AAM who was receiving HD 10-22. Developed agonal breathing. 911 activated and EMS found her in PEA arrest. Cpr and epi with return of pulses. Greater 6 min cpr time likely. Unresponsive. Tx to [facility] ED and PCCM asked to evaluate and admit. Pilar Plate pulmonary edema with refractory hypoxia treated with peep but HD is imperative. Renal and Cardiology consulted.   Events Since Admission: 10/22 PEA arrrest 10/22- refractory hypoxia and ARCTIC SUN   SUBJECTIVE/OVERNIGHT/INTERVAL HX Goal temp reached at 8pm last night.No other events  Vital Signs: Temp:  [85.8 F (29.9 C)-94.8 F (34.9 C)] 91.6 F (33.1 C) (10/23 0800) Pulse Rate:  [45-117] 48  (10/23 0800) Resp:  [12-26] 12  (10/23 0800) BP: (81-171)/(6-99) 153/63 mmHg (10/23 0405) SpO2:  [92 %-100 %] 98 % (10/23 0800) Arterial Line BP: (86-188)/(43-88) 154/63 mmHg (10/23 0800) FiO2 (%):  [50 %-100 %] 50 % (10/23 0800) Weight:  [72.258 kg (159 lb 4.8 oz)-90.23 kg (198 lb 14.7 oz)] 88.33 kg (194 lb 11.7 oz) (10/22 2000)  Physical Examination: General: unresponsive, post arrest Neuro:  Sedated , PERL HEENT:  No LAN Neck:  + JVD major Cardiovascular:  HSR RRR Lungs:  CTA bialterally Abdomen:  obese Musculoskeletal:  intact Skin:  Cool   Dg Chest Portable 1 View  07/11/2012  *RADIOLOGY REPORT*  Clinical Data: Cardiac arrest, line placement  PORTABLE CHEST - 1 VIEW  Comparison: Portable chest x-ray of 07/11/2012  Findings: There has been worsening of diffuse airspace disease most consistent with edema.  The tip of endotracheal tube is approximately 2.5 cm above the carina.  The left IJ central venous line is present with the tip overlying the upper SVC.  No pneumothorax is noted.  Cardiomegaly is stable. NG tube coils in the stomach.  IMPRESSION:  1.  Left IJ central  venous line tip overlies the upper SVC.  No pneumothorax. 2.  Worsening of airspace disease most consistent with edema. 3.  Tip of endotracheal tube 2.5 cm above the carina.   Original Report Authenticated By: Joretta Bachelor, M.D.    Dg Chest Portable 1 View  07/11/2012  *RADIOLOGY REPORT*  Clinical Data: ETT placement  PORTABLE CHEST - 1 VIEW  Comparison: None.  Findings: Preferential intubation of the right mainstem bronchus. Withdrawal approximately 4 cm is suggested.  Patchy bilateral perihilar opacities, right greater than left, suspicious for multifocal infection, less likely edema or pulmonary hemorrhage.  No pleural effusion or pneumothorax.  The heart is top normal in size.  Stent graft to the left axilla.  IMPRESSION: Preferential intubation of the right mainstem bronchus.  Withdrawal approximately 4 cm is suggested.  Patchy bilateral perihilar opacities, right greater than left, suspicious for multifocal infection.   Original Report Authenticated By: Julian Hy, M.D.      Active Problems:  Cardiac arrest  ESRD (end stage renal disease)  HTN (hypertension)  Obesity  DM (diabetes mellitus)  Acute respiratory failure with hypoxia    ASSESSMENT AND PLAN  PULMONARY  Lab 07/12/12 0356 07/11/12 2241 07/11/12 2114 07/11/12 1447 07/11/12 1321  PHART 7.416 7.517* 7.608* 7.452* 7.297*  PCO2ART 41.8 28.7* 24.2* 34.1* 52.4*  PO2ART 82.0 120.0* 559.0* 211.0* 51.0*  HCO3 28.0* 24.9* 25.4* 24.3* 25.6*  O2SAT 98.0 99.0 100.0 100.0 82.0   Ventilator Settings: Vent Mode:  [-] PRVC  FiO2 (%):  [50 %-100 %] 50 % Set Rate:  [12 bmp-26 bmp] 12 bmp Vt Set:  [460 mL-490 mL] 460 mL PEEP:  [5 cmH20-22 cmH20] 8 cmH20 Plateau Pressure:  [23 [id number] cmH20] 23 cmH20  ETT:  10-22>>  A:  Post PEA arrest with pulmonary edema, Refractory hypoxia, Acute resp failure  - on 07/12/12: Does not meet SBT criteria. At admit failed PRVC and PCV attempts but currently tolerating well 50% fio2/peep 10 P:    Full vent support Maintain current MV, repeat abg As we approach  Goal temp, will need less MV   CARDIOVASCULAR  Lab 07/12/12 0607 07/12/12 0030 07/11/12 1807 07/11/12 1207  TROPONINI 0.62* 0.77* 0.57* --  LATICACIDVEN -- -- -- 8.1*  PROBNP -- -- -- --   ECG:  SINUS RHYTHM ~ normal P axis, V-rate 50- 99 BORDERLINE PROLONGED QT INTERVAL ~ QTc >468mS Lines:  10-22 lt fem a line>> 10-22 rt fem one port cvl>> 10-22 lt ij cvl>>  A: Post PEA arresst, r/o cardiogenic pulm edema, HTN emergency related likely (unofficial echo only small peri effn) - on 07/12/12: expect rewarming 8pm  P:  -Hypothermia protocol, see neuro -MAP goals 80-85 -rest per cards; await formal echo report  RENAL  Lab 07/12/12 0352 07/11/12 2249 07/11/12 1909 07/11/12 1820 07/11/12 1706 07/11/12 1654 07/11/12 1207  NA 139 138 137 138 141 -- --  K 3.9 3.0* -- -- -- -- --  CL 98 100 98 99 102 -- --  CO2 -- -- 22 23 -- 21 19  BUN 32* 27* 10 19 19  -- --  CREATININE 4.80* 4.50* 1.77* 3.28* 3.00* -- --  CALCIUM -- -- 8.5 9.1 -- 9.1 10.4  MG -- -- -- -- -- -- --  PHOS -- -- -- -- -- -- --   Intake/Output      10/22 0701 - 10/23 0700 10/23 0701 - 10/24 0700   I.V. (mL/kg) 954.9 (10.8)    IV Piggyback 300    Total Intake(mL/kg) 1254.9 (14.2)    Urine (mL/kg/hr) 206 (0.1)    Emesis/NG output 200    Other 3277    Total Output 3683    Net -2428.1          Foley: none  A:  ESRD, pulm edema , unable to oxygenate, art risk hypokalemia secondary to shifts from hypothermia - on 07/11/12 - stat HD P:   appreciate renal support -hypothermia, may have some hypokalemia, bmet q4h -kvo  GASTROINTESTINAL  Lab 07/11/12 1207  AST 46*  ALT 26  ALKPHOS 114  BILITOT 0.4  PROT 6.6  ALBUMIN 3.0*    A:  At risk gastric ulcer P:  ppi Npo Follow lft in am for shock liver  HEMATOLOGIC  Lab 07/12/12 0352 07/11/12 2249 07/11/12 2230 07/11/12 1706 07/11/12 1512 07/11/12 1457 07/11/12 1207  HGB 10.2* 10.2* --  12.2 -- 8.2* 9.3*  HCT 30.0* 30.0* -- 36.0 -- 24.0* 30.9*  PLT -- -- -- -- -- -- 154  INR -- -- 1.39 -- 1.40 -- --  APTT -- -- 34 -- 55* -- --   A: Anemia, no bleeding noted P:  -monitor cbc and tx for< 7.0 scd  INFECTIOUS  Lab 07/11/12 1207  WBC 12.0*  PROCALCITON --   Cultures: 10-22 bc>> 10-22 sputum>> Antibiotics: Ceftazx 10-22>> Vanc  10-22>>  A:  Empirical abx r/o bacteremia on HD P:   See flow bc sputum  ENDOCRINE  Lab 07/12/12 0752 07/12/12 0352 07/12/12  0028 07/11/12 2217 07/11/12 2036  GLUCAP 98 120* 130* 161* 125*   A:  DM  P:   -SSI  NEUROLOGIC  A:  AMS post arrest, high risk anoxia - 07/11/12 induced hypthermia. Goal temp reached 8pm P:   -hypothermia protociol =- reassess neuro status once rewarming complete    BEST PRACTICE / DISPOSITION Level of Care:  ICU Primary Service:  PCCM Consultants:  Renal(schertz)/Cards Harwani Code Status:  full Diet:  npo DVT Px:  scd GI Px:  ppi Skin Integrity:  intact Social / Family:  none    The patient is critically ill with multiple organ systems failure and requires high complexity decision making for assessment and support, frequent evaluation and titration of therapies, application of advanced monitoring technologies and extensive interpretation of multiple databases.   Critical Care Time devoted to patient care services described in this note is  35  Minutes.  Dr. Brand Males, M.D., [facility].C.P Pulmonary and Critical Care Medicine Staff Physician [facility] Pager: [phone], If no answer or between  15:00h - 7:00h: call [phone]  07/12/2012 9:15 AM

## 2012-07-12 NOTE — Progress Notes (Signed)
Echocardiogram 2D Echocardiogram has been performed.  Naethan Bracewell 07/12/2012, 11:44 AM

## 2012-07-12 NOTE — Plan of Care (Signed)
Problem: Phase I Progression Outcomes Goal: Cool target temp reached 2 hrs from start Outcome: Not Met (add Reason) HD machine warming function prohibited reaching goal in 2 hrs. MD aware

## 2012-07-13 ENCOUNTER — Inpatient Hospital Stay (HOSPITAL_COMMUNITY): Payer: Medicare Other

## 2012-07-13 DIAGNOSIS — E669 Obesity, unspecified: Secondary | ICD-10-CM

## 2012-07-13 DIAGNOSIS — I10 Essential (primary) hypertension: Secondary | ICD-10-CM

## 2012-07-13 DIAGNOSIS — R609 Edema, unspecified: Secondary | ICD-10-CM

## 2012-07-13 LAB — CBC
HCT: 27.8 % — ABNORMAL LOW (ref 36.0–46.0)
Hemoglobin: 8.7 g/dL — ABNORMAL LOW (ref 12.0–15.0)
Hemoglobin: 9 g/dL — ABNORMAL LOW (ref 12.0–15.0)
MCH: 23.5 pg — ABNORMAL LOW (ref 26.0–34.0)
MCHC: 31.7 g/dL (ref 30.0–36.0)
MCV: 75.1 fL — ABNORMAL LOW (ref 78.0–100.0)
Platelets: 88 10*3/uL — ABNORMAL LOW (ref 150–400)
RBC: 3.7 MIL/uL — ABNORMAL LOW (ref 3.87–5.11)
RBC: 3.85 MIL/uL — ABNORMAL LOW (ref 3.87–5.11)
WBC: 5.2 10*3/uL (ref 4.0–10.5)

## 2012-07-13 LAB — BASIC METABOLIC PANEL
BUN: 29 mg/dL — ABNORMAL HIGH (ref 6–23)
Chloride: 100 mEq/L (ref 96–112)
Creatinine, Ser: 4.33 mg/dL — ABNORMAL HIGH (ref 0.50–1.10)
GFR calc Af Amer: 13 mL/min — ABNORMAL LOW (ref >90)
GFR calc Af Amer: 14 mL/min — ABNORMAL LOW (ref >90)
GFR calc non Af Amer: 11 mL/min — ABNORMAL LOW (ref >90)
Potassium: 3.6 mEq/L (ref 3.5–5.1)
Sodium: 140 mEq/L (ref 135–145)

## 2012-07-13 LAB — CK TOTAL AND CKMB (NOT AT ARMC)
CK, MB: 4.3 ng/mL — ABNORMAL HIGH (ref 0.3–4.0)
Relative Index: INVALID (ref 0.0–2.5)

## 2012-07-13 LAB — GLUCOSE, CAPILLARY
Glucose-Capillary: 139 mg/dL — ABNORMAL HIGH (ref 70–99)
Glucose-Capillary: 149 mg/dL — ABNORMAL HIGH (ref 70–99)
Glucose-Capillary: 130 mg/dL — ABNORMAL HIGH (ref 70–99)

## 2012-07-13 MED ORDER — FENTANYL BOLUS VIA INFUSION
50.0000 ug | INTRAVENOUS | Status: DC | PRN
  Filled 2012-07-13: qty 200

## 2012-07-13 MED ORDER — SODIUM CHLORIDE 0.9 % IV SOLN
25.0000 ug/h | INTRAVENOUS | Status: DC
  Administered 2012-07-13: 500 ug/h via INTRAVENOUS
  Filled 2012-07-13 (×2): qty 50

## 2012-07-13 MED ORDER — PROPOFOL 10 MG/ML IV EMUL
5.0000 ug/kg/min | INTRAVENOUS | Status: DC
  Administered 2012-07-13: 10 ug/kg/min via INTRAVENOUS
  Administered 2012-07-13: 20 ug/kg/min via INTRAVENOUS
  Administered 2012-07-14: 10 ug/kg/min via INTRAVENOUS
  Filled 2012-07-13 (×2): qty 100

## 2012-07-13 MED ORDER — DEXTROSE 5 % IV SOLN
2.0000 g | INTRAVENOUS | Status: DC | PRN
  Administered 2012-07-13: 2 g via INTRAVENOUS
  Filled 2012-07-13: qty 2

## 2012-07-13 MED ORDER — VANCOMYCIN HCL IN DEXTROSE 1-5 GM/200ML-% IV SOLN
1000.0000 mg | Freq: Once | INTRAVENOUS | Status: AC
  Administered 2012-07-13: 1000 mg via INTRAVENOUS
  Filled 2012-07-13: qty 200

## 2012-07-13 MED ORDER — DEXTROSE 5 % IV SOLN
2.0000 g | Freq: Once | INTRAVENOUS | Status: DC
  Filled 2012-07-13: qty 2

## 2012-07-13 NOTE — Progress Notes (Signed)
Name: [name] MRN: [id number] DOB: 18-Dec-1960    LOS: 2  Referring Provider:  EDP Reason for Referral: PEA arrest  PULMONARY / CRITICAL CARE MEDICINE  HPI:  51 yo AAM who was receiving HD 10-22. Developed agonal breathing. 911 activated and EMS found her in PEA arrest. Cpr and epi with return of pulses. Greater 6 min cpr time likely. Unresponsive. Tx to [facility] ED and PCCM asked to evaluate and admit. Pilar Plate pulmonary edema with refractory hypoxia treated with peep but HD is imperative. Renal and Cardiology consulted.   Events Since Admission: 10/22 PEA arrrest 10/22- refractory hypoxia and ARCTIC SUN 10/23- rewarming 8pm 10/24- prelim echo: good LV fun per Dr Terrence Dupont   SUBJECTIVE/OVERNIGHT/INTERVAL HX 07/13/12 - rewarmed. Labile BP. Following command but very agitated. HD currently   Vital Signs: Temp:  [88.5 F (31.4 C)-98.8 F (37.1 C)] 98.8 F (37.1 C) (10/24 0713) Pulse Rate:  [44-117] 109  (10/24 0930) Resp:  [12-22] 16  (10/24 0930) BP: (86-225)/(39-84) 117/61 mmHg (10/24 0930) SpO2:  [93 %-100 %] 100 % (10/24 0930) Arterial Line BP: (102-224)/(40-90) 176/71 mmHg (10/24 0800) FiO2 (%):  [40 %-50 %] 40 % (10/24 0721) Weight:  [85.6 kg (188 lb 11.4 oz)-90.4 kg (199 lb 4.7 oz)] 85.6 kg (188 lb 11.4 oz) (10/24 0730)  Physical Examination: General: critical and chronically unwell looking female Neuro:On fent 100mg  and versed 4 -> RASS -2 and follows some commands.  HEENT:  No LAN Neck:  + JVD major Cardiovascular:  HSR RRR Lungs:  CTA bialterally Abdomen:  obese Musculoskeletal:  intact Skin:  Cool   Dg Chest Port 1 View  07/13/2012  *RADIOLOGY REPORT*  Clinical Data: Assess endotracheal tube placement.  PORTABLE CHEST - 1 VIEW  Comparison: Chest x-ray 07/12/2012.  Findings: Endotracheal tube remains in a slightly low position, approximately 2.0 cm above the carina. There is a left-sided internal jugular central venous catheter with tip terminating in the proximal  superior vena cava.A nasogastric tube is seen extending into the stomach, however, the tip of the nasogastric tube extends below the lower margin of the image.  Lung volumes are low.  Persistent bibasilar opacities may represent areas of atelectasis and/or consolidation, superimposed upon moderate left and small right-sided pleural effusions.  There continues to be some cephalization of the pulmonary vasculature, and indistinctness of interstitial markings, suggesting interstitial pulmonary edema. Heart size is mildly enlarged (unchanged).  Upper mediastinal contours are unremarkable.  Atherosclerosis in the thoracic aorta. Transcutaneous pacing pads are seen projecting over the left hemithorax.  IMPRESSION: 1.  Support apparatus, as above.  Endotracheal tube remains a slightly low position only 2 cm above the carina.  Consider withdrawal approximate 2 cm for more optimal placement. 2.  Slight improvement in underlying congestive heart failure with slight decrease in pulmonary edema and decrease in size of bilateral pleural effusions, as above. 3.  Persistent bibasilar opacities may reflect areas of bilateral lower lobe atelectasis and/or consolidation.   Original Report Authenticated By: Etheleen Mayhew, M.D.    Dg Chest Port 1 View  07/12/2012  *RADIOLOGY REPORT*  Clinical Data: Follow up pulmonary edema after dialysis.  PORTABLE CHEST - 1 VIEW  Comparison: 1 day prior  Findings: Endotracheal tube 1.6 cm above carina.  Left IJ central line unchanged; tip mid SVC.  Nasogastric tube extends beyond the inferior aspect of the film.  Numerous leads and wires project over the chest.  There is extensive artifact over the left hemithorax as well.  Cardiomegaly accentuated  by AP portable technique.  New or increased small bilateral pleural effusions. No pneumothorax. Lower lobe predominant interstitial and airspace disease is improved.  IMPRESSION:  1.  Improvement in congestive heart failure. 2.  New or enlarged  bilateral pleural effusions. 3.  Endotracheal tube borderline low position; 1.6 cm above carina. Consider retraction 2 cm versus attention on follow-up.   Original Report Authenticated By: Areta Haber, M.D.    Dg Chest Portable 1 View  07/11/2012  *RADIOLOGY REPORT*  Clinical Data: Cardiac arrest, line placement  PORTABLE CHEST - 1 VIEW  Comparison: Portable chest x-ray of 07/11/2012  Findings: There has been worsening of diffuse airspace disease most consistent with edema.  The tip of endotracheal tube is approximately 2.5 cm above the carina.  The left IJ central venous line is present with the tip overlying the upper SVC.  No pneumothorax is noted.  Cardiomegaly is stable. NG tube coils in the stomach.  IMPRESSION:  1.  Left IJ central venous line tip overlies the upper SVC.  No pneumothorax. 2.  Worsening of airspace disease most consistent with edema. 3.  Tip of endotracheal tube 2.5 cm above the carina.   Original Report Authenticated By: Joretta Bachelor, M.D.    Dg Chest Portable 1 View  07/11/2012  *RADIOLOGY REPORT*  Clinical Data: ETT placement  PORTABLE CHEST - 1 VIEW  Comparison: None.  Findings: Preferential intubation of the right mainstem bronchus. Withdrawal approximately 4 cm is suggested.  Patchy bilateral perihilar opacities, right greater than left, suspicious for multifocal infection, less likely edema or pulmonary hemorrhage.  No pleural effusion or pneumothorax.  The heart is top normal in size.  Stent graft to the left axilla.  IMPRESSION: Preferential intubation of the right mainstem bronchus.  Withdrawal approximately 4 cm is suggested.  Patchy bilateral perihilar opacities, right greater than left, suspicious for multifocal infection.   Original Report Authenticated By: Julian Hy, M.D.      Active Problems:  Cardiac arrest  ESRD (end stage renal disease)  HTN (hypertension)  Obesity  DM (diabetes mellitus)  Acute respiratory failure with hypoxia    ASSESSMENT  AND PLAN  PULMONARY  Lab 07/12/12 0356 07/11/12 2241 07/11/12 2114 07/11/12 1447 07/11/12 1321  PHART 7.416 7.517* 7.608* 7.452* 7.297*  PCO2ART 41.8 28.7* 24.2* 34.1* 52.4*  PO2ART 82.0 120.0* 559.0* 211.0* 51.0*  HCO3 28.0* 24.9* 25.4* 24.3* 25.6*  O2SAT 98.0 99.0 100.0 100.0 82.0   Ventilator Settings: Vent Mode:  [-] PRVC FiO2 (%):  [40 %-50 %] 40 % Set Rate:  [12 bmp] 12 bmp Vt Set:  [460 mL] 460 mL PEEP:  [5 cmH20-8 cmH20] 5 cmH20 Plateau Pressure:  [21 cmH20-24 cmH20] 21 cmH20  ETT:  10-22>>  A:  Post PEA arrest with pulmonary edema, Refractory hypoxia, Acute resp failure  - on 07/12/12: Does not meet SBT criteria. At admit failed PRVC and PCV attempts but currently tolerating well 50% fio2/peep 10 - on 07/13/12: Fio2 40%/ peep 5.  Not extubatable due to HD and agitation during WUA  P:   SBT as tolerated Extubate when posible; not 07/13/2012    CARDIOVASCULAR  Lab 07/12/12 2355 07/12/12 1641 07/12/12 1155 07/12/12 0607 07/12/12 0030 07/11/12 1207  TROPONINI <0.30 <0.30 0.33* 0.62* 0.77* --  LATICACIDVEN -- -- -- -- -- 8.1*  PROBNP -- -- -- -- -- --   ECG:  SINUS RHYTHM ~ normal P axis, V-rate 50- 99 BORDERLINE PROLONGED QT INTERVAL ~ QTc >447mS Lines:  10-22 lt  fem a line>> 10-22 rt fem one port cvl>> 10-22 lt ij cvl>>  A: Post PEA arresst, r/o cardiogenic pulm edema, HTN emergency related likely (unofficial echo only small peri effn) - on 07/13/12: normal hemodynamics. S/p rewarming 07/12/12. ECHO normal prelim  P:  -Hypothermia protocol, see neuro -MAP goals 80-85 -rest per cards; await formal echo report  RENAL  Lab 07/13/12 0400 07/12/12 2350 07/12/12 1940 07/12/12 1600 07/12/12 1155  NA 139 140 141 137 136  K 3.7 3.6 -- -- --  CL 98 100 100 96 97  CO2 26 26 24 26 25   BUN 29* 27* 23 18 38*  CREATININE 4.33* 4.04* 3.38* 2.51* 5.52*  CALCIUM 7.4* 7.5* 7.5* 8.0* 7.8*  MG 2.0 -- -- -- --  PHOS 5.6* -- -- -- --   Intake/Output      10/23 0701 -  10/24 0700 10/24 0701 - 10/25 0700   I.V. (mL/kg) 1415.4 (16.5)    IV Piggyback 210    Total Intake(mL/kg) 1625.4 (19)    Urine (mL/kg/hr) 46 (0)    Emesis/NG output 125    Other 3100    Total Output 3271    Net -1645.6          Foley: none  A:  ESRD, pulm edema , unable to oxygenate, art risk hypokalemia secondary to shifts from hypothermia - on 07/11/12 - stat HD P:   appreciate renal support   GASTROINTESTINAL  Lab 07/11/12 1207  AST 46*  ALT 26  ALKPHOS 114  BILITOT 0.4  PROT 6.6  ALBUMIN 3.0*    A:  At risk gastric ulcer P:  ppi Npo   HEMATOLOGIC  Lab 07/12/12 2350 07/12/12 0352 07/11/12 2249 07/11/12 2230 07/11/12 1706 07/11/12 1512 07/11/12 1457 07/11/12 1207  HGB 9.0* 10.2* 10.2* -- 12.2 -- 8.2* --  HCT 28.4* 30.0* 30.0* -- 36.0 -- 24.0* --  PLT 88* -- -- -- -- -- -- 154  INR -- -- -- 1.39 -- 1.40 -- --  APTT -- -- -- 34 -- 55* -- --   A: Anemia, no bleeding noted. Due to criticall illness Thrombocytopenia - 48h post admit noted 07/12/12  P:  -monitor cbc and tx for< 7.0 scd - recheck cbcd and HIT panel - check duplex LE - DC HEPARIN SQ - Will need to ask renal to dc heparin products for HD  INFECTIOUS  Lab 07/12/12 2350 07/11/12 1207  WBC 6.8 12.0*  PROCALCITON -- --   Cultures: 10-22 bc>> 10-22 sputum>>  Results for orders placed during the hospital encounter of 07/11/12  MRSA PCR SCREENING     Status: Normal   Collection Time   07/11/12  4:05 PM      Component Value Range Status Comment   MRSA by PCR NEGATIVE  NEGATIVE Final   CULTURE, BLOOD (ROUTINE X 2)     Status: Normal (Preliminary result)   Collection Time   07/11/12  4:56 PM      Component Value Range Status Comment   Specimen Description BLOOD CENTRAL LINE   Final    Special Requests BOTTLES DRAWN AEROBIC AND ANAEROBIC 10CC   Final    Culture  Setup Time 07/12/2012 03:05   Final    Culture     Final    Value:        BLOOD CULTURE RECEIVED NO GROWTH TO DATE CULTURE WILL  BE HELD FOR 5 DAYS BEFORE ISSUING A FINAL NEGATIVE REPORT   Report Status PENDING  Incomplete   CULTURE, BLOOD (ROUTINE X 2)     Status: Normal (Preliminary result)   Collection Time   07/11/12  5:28 PM      Component Value Range Status Comment   Specimen Description BLOOD ARM RIGHT   Final    Special Requests BOTTLES DRAWN AEROBIC ONLY 3CC   Final    Culture  Setup Time 07/12/2012 03:05   Final    Culture     Final    Value:        BLOOD CULTURE RECEIVED NO GROWTH TO DATE CULTURE WILL BE HELD FOR 5 DAYS BEFORE ISSUING A FINAL NEGATIVE REPORT   Report Status PENDING   Incomplete      Antibiotics: Ceftazx 10-22>> Vanc  10-22>>  Anti-infectives     Start     Dose/Rate Route Frequency Ordered Stop   07/12/12 1600   vancomycin (VANCOCIN) IVPB 1000 mg/200 mL premix        1,000 mg 200 mL/hr over 60 Minutes Intravenous Every Hemodialysis 07/12/12 1204     07/12/12 1600   cefTAZidime (FORTAZ) 2 g in dextrose 5 % 50 mL IVPB        2 g 100 mL/hr over 30 Minutes Intravenous Every Hemodialysis 07/12/12 1204     07/11/12 2000   vancomycin (VANCOCIN) 1,500 mg in sodium chloride 0.9 % 500 mL IVPB        1,500 mg 250 mL/hr over 120 Minutes Intravenous  Once 07/11/12 1447 07/11/12 2300   07/11/12 2000   cefTAZidime (FORTAZ) 2 g in dextrose 5 % 50 mL IVPB        2 g 100 mL/hr over 30 Minutes Intravenous  Once 07/11/12 1447 07/11/12 2059           A:  Empirical abx r/o bacteremia on HD -07/13/12 : culture neg so far P:   Dc vanc Continue ceftaz ENDOCRINE  Lab 07/13/12 0733 07/13/12 0355 07/12/12 2352 07/12/12 1939 07/12/12 1645  GLUCAP 130* 139* 149* 150* 150*   A:  DM  P:   -SSI  NEUROLOGIC  A:  AMS post arrest, high risk anoxia - 07/11/12 induced hypthermia. Goal temp reached 8pm 0 07/13/12: s/p rewarming. Follows commands but agitated on WUA P:   - start dioprivan - wean off fent/versed gtt -  BEST PRACTICE / DISPOSITION Level of Care:  ICU Primary Service:   PCCM Consultants:  Renal(schertz)/Cards Harwani Code Status:  full Diet:  npo DVT Px:  scd GI Px:  ppi Skin Integrity:  intact Social / Family:  None at bedside    The patient is critically ill with multiple organ systems failure and requires high complexity decision making for assessment and support, frequent evaluation and titration of therapies, application of advanced monitoring technologies and extensive interpretation of multiple databases.   Critical Care Time devoted to patient care services described in this note is  35  Minutes.  Dr. Brand Males, M.D., [facility].C.P Pulmonary and Critical Care Medicine Staff Physician [place] [facility] Pager: [phone], If no answer or between  15:00h - 7:00h: call [phone]  07/13/2012 9:43 AM

## 2012-07-13 NOTE — Progress Notes (Signed)
INITIAL ADULT NUTRITION ASSESSMENT Date: 07/13/2012   Time: 12:33 PM Reason for Assessment: vent   1. Recommend initiation of EN, Osmolite 1.5 @ 20 ml/hr and 30 ml Pro-stat TID. This EN regimen + kcal from propofol will provide 1292 kcal, 75 gm protein, and 366 ml free water. ( Provides 864 mg K+, 480 mg Phos, and 192 mg Magnesium at this rate.)   2. RD will continue to follow    [place] Per approved criteria  -Obesity Unspecified    ASSESSMENT: Female 51 y.o.  Dx: Cardiac arrest  Hx:  Past Medical History  Diagnosis Date  . Renal disorder   . Diabetes mellitus without complication   . Hypertension   . Thyroid disorder   . GERD (gastroesophageal reflux disease)   . Neuropathy   . Hypothyroidism   . Peripheral vascular disease   . Anemia   . Hemodialysis patient   . Hyperlipidemia   . SVT (supraventricular tachycardia)   . Sickle cell trait   . Gastroparesis due to DM     Past Surgical History  Procedure Date  . Thyroidectomy   . Knee arthroplasty   . Cataract extraction, bilateral     Related Meds:     . antiseptic oral rinse  15 mL Mouth Rinse QID  . artificial tears  1 application Both Eyes [id number]  . chlorhexidine  15 mL Mouth Rinse BID  . darbepoetin  100 mcg Intravenous Q Tue-HD  . ferric gluconate (FERRLECIT/NULECIT) IV  62.5 mg Intravenous Q Tue-HD  . influenza  inactive virus vaccine  0.5 mL Intramuscular Tomorrow-1000  . insulin aspart  0-9 Units Subcutaneous Q4H  . pantoprazole (PROTONIX) IV  40 mg Intravenous Daily  . paricalcitol  9 mcg Intravenous 3 times weekly  . vancomycin  1,000 mg Intravenous Once  . DISCONTD: heparin subcutaneous  5,000 Units Subcutaneous Q8H     Ht: 5\' 2"  (157.5 cm)  Wt: 183 lb 3.2 oz (83.1 kg)  Ideal Wt: 50 kg  % Ideal Wt: 166%  Usual Wt:  Wt Readings from Last 5 Encounters:  07/13/12 183 lb 3.2 oz (83.1 kg)   Dry weight: 84 kg   % Usual Wt: 99%  Body mass index is 33.51 kg/(m^2). Pt is obese  class 1 per current BMI   Food/Nutrition Related Hx: No recent unintentional weight loss or poor appetite per MST (Malnutrition Screening Tool) documentation   Labs:  CMP     Component Value Date/Time   NA 139 07/13/2012 0400   K 3.7 07/13/2012 0400   CL 98 07/13/2012 0400   CO2 26 07/13/2012 0400   GLUCOSE 143* 07/13/2012 0400   BUN 29* 07/13/2012 0400   CREATININE 4.33* 07/13/2012 0400   CALCIUM 7.4* 07/13/2012 0400   PROT 6.6 07/11/2012 1207   ALBUMIN 3.0* 07/11/2012 1207   AST 46* 07/11/2012 1207   ALT 26 07/11/2012 1207   ALKPHOS 114 07/11/2012 1207   BILITOT 0.4 07/11/2012 1207   GFRNONAA 11* 07/13/2012 0400   GFRAA 13* 07/13/2012 0400   Sodium  Date/Time Value Range Status  07/13/2012  4:00 AM 139  135 - 145 mEq/L Final  07/12/2012 11:50 PM 140  135 - 145 mEq/L Final  07/12/2012  7:40 PM 141  135 - 145 mEq/L Final    Potassium  Date/Time Value Range Status  07/13/2012  4:00 AM 3.7  3.5 - 5.1 mEq/L Final  07/12/2012 11:50 PM 3.6  3.5 - 5.1 mEq/L Final  07/12/2012  7:40  PM 3.3* 3.5 - 5.1 mEq/L Final    Phosphorus  Date/Time Value Range Status  07/13/2012  4:00 AM 5.6* 2.3 - 4.6 mg/dL Final    Magnesium  Date/Time Value Range Status  07/13/2012  4:00 AM 2.0  1.5 - 2.5 mg/dL Final    Intake/Output Summary (Last 24 hours) at 07/13/12 1334 Last data filed at 07/13/12 1200  Gross per 24 hour  Intake 1350.29 ml  Output   5386 ml  Net -4035.71 ml     Diet Order:   NPO   Supplements/Tube Feeding: none   IVF:    sodium chloride Last Rate: 20 mL/hr at 07/11/12 2215  sodium chloride Last Rate: 20 mL/hr at 07/11/12 2215  fentaNYL infusion INTRAVENOUS   midazolam (VERSED) infusion Last Rate: 4 mg/hr (07/13/12 0003)  norepinephrine (LEVOPHED) Adult infusion Last Rate: 3 mcg/min (07/13/12 1000)  propofol Last Rate: 10 mcg/kg/min (07/13/12 1000)  DISCONTD: cisatracurium (NIMBEX) infusion Last Rate: Stopped (07/13/12 0440)  DISCONTD: fentaNYL infusion  INTRAVENOUS Last Rate: 100 mcg/hr (07/13/12 0449)    Estimated Nutritional Needs:   Kcal: [id number] (underfeeding goal: 1014-1183 kcal) Protein: 100-115 gm Fluid:  1-1.2 L   Pt was at dialysis when developed agonal breathing and EMS called, found in PEA arrest. Intubated and started on arctic sun protocol. Severe pulmonary edema.  Pt now s/p arctic sun. Has received HD every day this admission, likely all excesss fluids off at this point per nephrology. Pt with brown/tan secretions from ETT.  Recommend initiation of EN if pt expected to remain intubated. Recommend using Osmolite 1.5 to better meet protein needs while pt needs to be underfed and on propofol.  Patient is currently intubated on ventilator support.  MV: 11 Temp:Temp (24hrs), Avg:94.7 F (34.8 C), Min:88.5 F (31.4 C), Max:98.9 F (37.2 C)  Propofol: 10.3 ml/hr  Providing 272 kcal from lipids daily.   NUTRITION DIAGNOSIS: -Inadequate oral intake (NI-2.1).  Status: Ongoing  RELATED TO: inability to eat  AS EVIDENCE BY: mechanical ventilation   MONITORING/EVALUATION(Goals): Goal: Enteral nutrition to provide 60-70% of estimated calorie needs (22-25 kcals/kg ideal body weight) and >/= 90% of estimated protein needs, based on ASPEN guidelines for permissive underfeeding in critically ill obese individuals.  Monitor: vent status, weight, labs, initiation of EN  EDUCATION NEEDS: -No education needs identified at this time  INTERVENTION:   Orson Slick RD, LDN Pager [phone] After Hours pager [phone]  07/13/2012, 12:33 PM

## 2012-07-13 NOTE — Progress Notes (Signed)
Subjective:  Patient sedated on paralytics tolerating hemodialysis well intubated  Objective:  Vital Signs in the last 24 hours: Temp:  [88.5 F (31.4 C)-98.8 F (37.1 C)] 98.8 F (37.1 C) (10/24 0713) Pulse Rate:  [44-117] 109  (10/24 0930) Resp:  [12-22] 16  (10/24 0930) BP: (86-225)/(39-84) 117/61 mmHg (10/24 0930) SpO2:  [93 %-100 %] 100 % (10/24 0930) Arterial Line BP: (102-224)/(40-90) 176/71 mmHg (10/24 0800) FiO2 (%):  [40 %-50 %] 40 % (10/24 0721) Weight:  [85.6 kg (188 lb 11.4 oz)-90.4 kg (199 lb 4.7 oz)] 85.6 kg (188 lb 11.4 oz) (10/24 0730)  Intake/Output from previous day: 10/23 0701 - 10/24 0700 In: 1625.4 [I.V.:1415.4; IV Piggyback:210] Out: 3271 [Urine:46; Emesis/NG output:125] Intake/Output from this shift:    Physical Exam: Neck: no adenopathy, no carotid bruit, no JVD and supple, symmetrical, trachea midline Lungs: Decrease vessel at bases with basilar rates Heart: regular rate and rhythm, S1, S2 normal and Soft systolic murmur noted Abdomen: soft, non-tender; bowel sounds normal; no masses,  no organomegaly Extremities: extremities normal, atraumatic, no cyanosis or edema  Lab Results:  Basename 07/12/12 2350 07/12/12 0352 07/11/12 1207  WBC 6.8 -- 12.0*  HGB 9.0* 10.2* --  PLT 88* -- 154    Basename 07/13/12 0400 07/12/12 2350  NA 139 140  K 3.7 3.6  CL 98 100  CO2 26 26  GLUCOSE 143* 150*  BUN 29* 27*  CREATININE 4.33* 4.04*    Basename 07/12/12 2355 07/12/12 1641  TROPONINI <0.30 <0.30   Hepatic Function Panel  Basename 07/11/12 1207  PROT 6.6  ALBUMIN 3.0*  AST 46*  ALT 26  ALKPHOS 114  BILITOT 0.4  BILIDIR --  IBILI --   No results found for this basename: CHOL in the last 72 hours No results found for this basename: PROTIME in the last 72 hours  Imaging: Imaging results have been reviewed and Dg Chest Port 1 View  07/13/2012  *RADIOLOGY REPORT*  Clinical Data: Assess endotracheal tube placement.  PORTABLE CHEST - 1 VIEW   Comparison: Chest x-ray 07/12/2012.  Findings: Endotracheal tube remains in a slightly low position, approximately 2.0 cm above the carina. There is a left-sided internal jugular central venous catheter with tip terminating in the proximal superior vena cava.A nasogastric tube is seen extending into the stomach, however, the tip of the nasogastric tube extends below the lower margin of the image.  Lung volumes are low.  Persistent bibasilar opacities may represent areas of atelectasis and/or consolidation, superimposed upon moderate left and small right-sided pleural effusions.  There continues to be some cephalization of the pulmonary vasculature, and indistinctness of interstitial markings, suggesting interstitial pulmonary edema. Heart size is mildly enlarged (unchanged).  Upper mediastinal contours are unremarkable.  Atherosclerosis in the thoracic aorta. Transcutaneous pacing pads are seen projecting over the left hemithorax.  IMPRESSION: 1.  Support apparatus, as above.  Endotracheal tube remains a slightly low position only 2 cm above the carina.  Consider withdrawal approximate 2 cm for more optimal placement. 2.  Slight improvement in underlying congestive heart failure with slight decrease in pulmonary edema and decrease in size of bilateral pleural effusions, as above. 3.  Persistent bibasilar opacities may reflect areas of bilateral lower lobe atelectasis and/or consolidation.   Original Report Authenticated By: Etheleen Mayhew, M.D.    Dg Chest Port 1 View  07/12/2012  *RADIOLOGY REPORT*  Clinical Data: Follow up pulmonary edema after dialysis.  PORTABLE CHEST - 1 VIEW  Comparison: 1 day  prior  Findings: Endotracheal tube 1.6 cm above carina.  Left IJ central line unchanged; tip mid SVC.  Nasogastric tube extends beyond the inferior aspect of the film.  Numerous leads and wires project over the chest.  There is extensive artifact over the left hemithorax as well.  Cardiomegaly accentuated by AP  portable technique.  New or increased small bilateral pleural effusions. No pneumothorax. Lower lobe predominant interstitial and airspace disease is improved.  IMPRESSION:  1.  Improvement in congestive heart failure. 2.  New or enlarged bilateral pleural effusions. 3.  Endotracheal tube borderline low position; 1.6 cm above carina. Consider retraction 2 cm versus attention on follow-up.   Original Report Authenticated By: Areta Haber, M.D.    Dg Chest Portable 1 View  07/11/2012  *RADIOLOGY REPORT*  Clinical Data: Cardiac arrest, line placement  PORTABLE CHEST - 1 VIEW  Comparison: Portable chest x-ray of 07/11/2012  Findings: There has been worsening of diffuse airspace disease most consistent with edema.  The tip of endotracheal tube is approximately 2.5 cm above the carina.  The left IJ central venous line is present with the tip overlying the upper SVC.  No pneumothorax is noted.  Cardiomegaly is stable. NG tube coils in the stomach.  IMPRESSION:  1.  Left IJ central venous line tip overlies the upper SVC.  No pneumothorax. 2.  Worsening of airspace disease most consistent with edema. 3.  Tip of endotracheal tube 2.5 cm above the carina.   Original Report Authenticated By: Joretta Bachelor, M.D.    Dg Chest Portable 1 View  07/11/2012  *RADIOLOGY REPORT*  Clinical Data: ETT placement  PORTABLE CHEST - 1 VIEW  Comparison: None.  Findings: Preferential intubation of the right mainstem bronchus. Withdrawal approximately 4 cm is suggested.  Patchy bilateral perihilar opacities, right greater than left, suspicious for multifocal infection, less likely edema or pulmonary hemorrhage.  No pleural effusion or pneumothorax.  The heart is top normal in size.  Stent graft to the left axilla.  IMPRESSION: Preferential intubation of the right mainstem bronchus.  Withdrawal approximately 4 cm is suggested.  Patchy bilateral perihilar opacities, right greater than left, suspicious for multifocal infection.   Original  Report Authenticated By: Julian Hy, M.D.     Cardiac Studies:  Assessment/Plan:  Status post PEA cardiac arrest  Resolving Acute pulmonary edema rule out ischemia /MI ruled out  Acute respiratory failure secondary to above rule out aspiration  Hypertension  Diabetes matters  Morbid obesity  End-stage renal disease on hemodialysis  Hypothyroidism  GERD  Diabetic neuropathy  Rule out Hypoxic encephalopathy Plan Continue present management as per CCM  LOS: 2 days    Klee Kolek N 07/13/2012, 9:44 AM

## 2012-07-13 NOTE — Progress Notes (Signed)
VASCULAR LAB PRELIMINARY  PRELIMINARY  PRELIMINARY  PRELIMINARY  Bilateral lower extremity venous Dopplers completed.    Preliminary report:  There is no DVT or SVT noted in the bilateral lower extremities.  [name], 07/13/2012, 2:18 PM

## 2012-07-13 NOTE — Progress Notes (Signed)
Subjective: Off of cooling protocol, rewarmed this am. Moving extremities. CVP 2-5. FiO2 40%, cxr with bibasilar effusions/consolidation, ETT returning purulent brown/tan secretions.  Objective Vital signs in last 24 hours: Filed Vitals:   07/13/12 0945 07/13/12 0959 07/13/12 1000 07/13/12 1015  BP: 126/64  117/70 143/63  Pulse: 105 100 122 120  Temp:      TempSrc:      Resp: 23 15 18 18   Height:      Weight:    83.1 kg (183 lb 3.2 oz)  SpO2: 100% 100% 100% 100%   Weight change: 18.142 kg (39 lb 15.9 oz)  Intake/Output Summary (Last 24 hours) at 07/13/12 1101 Last data filed at 07/13/12 1015  Gross per 24 hour  Intake 1423.29 ml  Output   5386 ml  Net -3962.71 ml   Labs: Basic Metabolic Panel:  Lab 0000000 0400 07/12/12 2350 07/12/12 1940 07/12/12 1600 07/12/12 1155 07/12/12 0920 07/12/12 0352 07/11/12 1909  NA 139 140 141 137 136 136 139 --  K 3.7 3.6 3.3* 2.9* 4.3 4.4 3.9 --  CL 98 100 100 96 97 97 98 --  CO2 26 26 24 26 25 24  -- 22  GLUCOSE 143* 150* 161* 157* 126* 129* 120* --  BUN 29* 27* 23 18 38* 36* 32* --  CREATININE 4.33* 4.04* 3.38* 2.51* 5.52* 5.28* 4.80* --  ALB -- -- -- -- -- -- -- --  CALCIUM 7.4* 7.5* 7.5* 8.0* 7.8* 7.9* -- 8.5  PHOS 5.6* -- -- -- -- -- -- --   Liver Function Tests:  Lab 07/11/12 1207  AST 46*  ALT 26  ALKPHOS 114  BILITOT 0.4  PROT 6.6  ALBUMIN 3.0*   No results found for this basename: LIPASE:3,AMYLASE:3 in the last 168 hours No results found for this basename: AMMONIA:3 in the last 168 hours CBC:  Lab 07/12/12 2350 07/12/12 0352 07/11/12 2249 07/11/12 1706 07/11/12 1207  WBC 6.8 -- -- -- 12.0*  NEUTROABS -- -- -- -- 6.0  HGB 9.0* 10.2* 10.2* 12.2 --  HCT 28.4* 30.0* 30.0* 36.0 --  MCV 73.8* -- -- -- 76.9*  PLT 88* -- -- -- 154   PT/INR: @labrcntip (inr:5) Cardiac Enzymes:  Lab 07/12/12 2355 07/12/12 1641 07/12/12 1155 07/12/12 0607 07/12/12 0030  CKTOTAL 40 68 66 86 103  CKMB 4.3* 5.7* 4.8* 4.8* 4.5*  CKMBINDEX --  -- -- -- --  TROPONINI <0.30 <0.30 0.33* 0.62* 0.77*   CBG:  Lab 07/13/12 0733 07/13/12 0355 07/12/12 2352 07/12/12 1939 07/12/12 1645  GLUCAP 130* 139* 149* 150* 150*    Iron Studies: No results found for this basename: IRON:30,TIBC:30,TRANSFERRIN:30,FERRITIN:30 in the last 168 hours  Physical Exam:  Blood pressure 143/63, pulse 120, temperature 98.8 F (37.1 C), temperature source Core (Comment), resp. rate 18, height 5\' 2"  (1.575 m), weight 83.1 kg (183 lb 3.2 oz), SpO2 100.00%.  Gen: intubated, opening eyes Skin: no rash, cyanosis Neck: no visible JVD, no bruits or LAN  Chest: clear bilat, no rales or wheezing  Heart: regular, no rub or gallop, no murmur noted  Abdomen: soft, obese, nondistended  Ext: No LE edema. Weight 83.5 kg half way thru HD. Good bruit over LUA AVF.  Neuro: Unresponsive  Outpatient HD: [facility], EDW 84kg, 4 hrs, 2.0K/2.25Ca, LUA AVF. Heparin 8400, EPO 16k, Venofer 50 mg/thurs, Zemplar 9ug. Optiflux 160, BFR450, Qd 1.5x.   Impression/Plan  1. Acute respiratory failure- pulm edema and prob has PNA also w purulent secretions, abnl cxr persisting  after removal of all excess fluid with HD 2. S/P cardiorespiratory arrest- rewarmed today, awake 3. CKD, hd tts via AVF. HD today  4. Volume overload- resolved, not tolerating much UF today (^HR, low BP, cvp low), I think we have gotten all the extra fluid off.   5. HTN- takes nifedipine 60/d,clonidine 0.2bid, metoprolol 100/d at home. Not getting here due to low BP's.  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 DM2- on insulin, per primary 8. Hypothyroidism   Kelly Splinter  MD [facility] [phone] pgr    [phone] cell 07/13/2012, 11:01 AM

## 2012-07-13 NOTE — Procedures (Signed)
I was present at this dialysis session. I have reviewed the session itself and made appropriate changes.   Kelly Splinter, MD [employer] 07/13/2012, 2:37 PM

## 2012-07-14 ENCOUNTER — Inpatient Hospital Stay (HOSPITAL_COMMUNITY): Payer: Medicare Other

## 2012-07-14 LAB — CBC
HCT: 23.4 % — ABNORMAL LOW (ref 36.0–46.0)
MCHC: 31.2 g/dL (ref 30.0–36.0)
MCV: 74.3 fL — ABNORMAL LOW (ref 78.0–100.0)
RDW: 20.2 % — ABNORMAL HIGH (ref 11.5–15.5)

## 2012-07-14 LAB — GLUCOSE, CAPILLARY
Glucose-Capillary: 145 mg/dL — ABNORMAL HIGH (ref 70–99)
Glucose-Capillary: 135 mg/dL — ABNORMAL HIGH (ref 70–99)
Glucose-Capillary: 101 mg/dL — ABNORMAL HIGH (ref 70–99)
Glucose-Capillary: 104 mg/dL — ABNORMAL HIGH (ref 70–99)

## 2012-07-14 LAB — BASIC METABOLIC PANEL
BUN: 35 mg/dL — ABNORMAL HIGH (ref 6–23)
Calcium: 7.9 mg/dL — ABNORMAL LOW (ref 8.4–10.5)
GFR calc non Af Amer: 10 mL/min — ABNORMAL LOW (ref >90)
Glucose, Bld: 139 mg/dL — ABNORMAL HIGH (ref 70–99)

## 2012-07-14 MED ORDER — LEVOTHYROXINE SODIUM 100 MCG IV SOLR
62.5000 ug | Freq: Every day | INTRAVENOUS | Status: DC
  Administered 2012-07-14 – 2012-07-16 (×3): 62.5 ug via INTRAVENOUS
  Filled 2012-07-14 (×4): qty 3.1

## 2012-07-14 MED ORDER — HEPARIN SODIUM (PORCINE) 1000 UNIT/ML DIALYSIS
6000.0000 [IU] | INTRAMUSCULAR | Status: DC | PRN
  Administered 2012-07-15: 6000 [IU] via INTRAVENOUS_CENTRAL
  Filled 2012-07-14: qty 6

## 2012-07-14 NOTE — Progress Notes (Signed)
Name: [name] MRN: [id number] DOB: Jan 18, 1961    LOS: 3  Referring Provider:  EDP Reason for Referral: PEA arrest  PULMONARY / CRITICAL CARE MEDICINE  HPI:  51 yo AAM who was receiving HD 10-22. Developed agonal breathing, then PEA arrest. Cpr and epi with return of pulses. Greater 6 min cpr time likely. Unresponsive. Tx to [facility] ED and PCCM asked to evaluate and admit. Treated for pulm edema +/- PNA.   Events Since Admission: 10/22 PEA arrrest 10/22- refractory hypoxia and ARCTIC SUN 10/23- rewarming 8pm 10/24- prelim echo: good LV fun per Dr Terrence Dupont  SUBJECTIVE/OVERNIGHT/INTERVAL HX Awake but lethargic this am Tolerating PSV HD completed last 3 days, none planned for today  Vital Signs: Temp:  [98.9 F (37.2 C)-101.3 F (38.5 C)] 99.1 F (37.3 C) (10/25 0800) Pulse Rate:  [98-117] 110  (10/25 1100) Resp:  [13-28] 21  (10/25 1100) BP: (134-138)/(53-54) 134/54 mmHg (10/25 0337) SpO2:  [96 %-100 %] 99 % (10/25 1100) Arterial Line BP: (98-160)/(44-66) 133/54 mmHg (10/25 1100) FiO2 (%):  [40 %] 40 % (10/25 0808) Weight:  [81.5 kg (179 lb 10.8 oz)] 81.5 kg (179 lb 10.8 oz) (10/25 0400)  Intake/Output Summary (Last 24 hours) at 07/14/12 1106 Last data filed at 07/14/12 1100  Gross per 24 hour  Intake  599.1 ml  Output    100 ml  Net  499.1 ml    Physical Examination: General: critical and chronically unwell looking female Neuro: follows some commands, lethargic HEENT:  No LAN Neck:  + JVD major Cardiovascular:  HSR RRR Lungs:  CTA bialterally Abdomen:  obese Musculoskeletal:  intact Skin:  Cool   Dg Chest Port 1 View  07/14/2012  *RADIOLOGY REPORT*  Clinical Data: Follow up edema.  PORTABLE CHEST - 1 VIEW  Comparison: 07/13/2012  Findings: Endotracheal tube 3 cm above the carina.  Venous catheter tip in the SVC and unchanged.  NG in the stomach.  No pneumothorax  Increase in bilateral airspace disease.  This may represent edema or pneumonia.  Small pleural  effusions are present.  IMPRESSION: Worsening bilateral airspace disease may represent pneumonia or edema.   Original Report Authenticated By: Truett Perna, M.D.    Dg Chest Port 1 View  07/13/2012  *RADIOLOGY REPORT*  Clinical Data: Assess endotracheal tube placement.  PORTABLE CHEST - 1 VIEW  Comparison: Chest x-ray 07/12/2012.  Findings: Endotracheal tube remains in a slightly low position, approximately 2.0 cm above the carina. There is a left-sided internal jugular central venous catheter with tip terminating in the proximal superior vena cava.A nasogastric tube is seen extending into the stomach, however, the tip of the nasogastric tube extends below the lower margin of the image.  Lung volumes are low.  Persistent bibasilar opacities may represent areas of atelectasis and/or consolidation, superimposed upon moderate left and small right-sided pleural effusions.  There continues to be some cephalization of the pulmonary vasculature, and indistinctness of interstitial markings, suggesting interstitial pulmonary edema. Heart size is mildly enlarged (unchanged).  Upper mediastinal contours are unremarkable.  Atherosclerosis in the thoracic aorta. Transcutaneous pacing pads are seen projecting over the left hemithorax.  IMPRESSION: 1.  Support apparatus, as above.  Endotracheal tube remains a slightly low position only 2 cm above the carina.  Consider withdrawal approximate 2 cm for more optimal placement. 2.  Slight improvement in underlying congestive heart failure with slight decrease in pulmonary edema and decrease in size of bilateral pleural effusions, as above. 3.  Persistent bibasilar opacities may  reflect areas of bilateral lower lobe atelectasis and/or consolidation.   Original Report Authenticated By: Etheleen Mayhew, M.D.     Active Problems:  Cardiac arrest  ESRD (end stage renal disease)  HTN (hypertension)  Obesity  DM (diabetes mellitus)  Acute respiratory failure with  hypoxia   ASSESSMENT AND PLAN  PULMONARY  Lab 07/12/12 0356 07/11/12 2241 07/11/12 2114 07/11/12 1447 07/11/12 1321  PHART 7.416 7.517* 7.608* 7.452* 7.297*  PCO2ART 41.8 28.7* 24.2* 34.1* 52.4*  PO2ART 82.0 120.0* 559.0* 211.0* 51.0*  HCO3 28.0* 24.9* 25.4* 24.3* 25.6*  O2SAT 98.0 99.0 100.0 100.0 82.0   Ventilator Settings: Vent Mode:  [-] CPAP;PSV FiO2 (%):  [40 %] 40 % Set Rate:  [12 bmp] 12 bmp Vt Set:  [460 mL] 460 mL PEEP:  [5 cmH20] 5 cmH20 Pressure Support:  [10 cmH20] 10 cmH20 Plateau Pressure:  [15 cmH20-22 cmH20] 18 cmH20  ETT:  10/22>>  A:  Post PEA arrest with apparent pulmonary edema, Refractory hypoxia, Acute resp failure P:   SBT as tolerated, goal extubation 10/25 if MS will allow   CARDIOVASCULAR  Lab 07/12/12 2355 07/12/12 1641 07/12/12 1155 07/12/12 0607 07/12/12 0030 07/11/12 1207  TROPONINI <0.30 <0.30 0.33* 0.62* 0.77* --  LATICACIDVEN -- -- -- -- -- 8.1*  PROBNP -- -- -- -- -- --   ECG:  SINUS RHYTHM ~ normal P axis, V-rate 50- 99 BORDERLINE PROLONGED QT INTERVAL ~ QTc >472mS Lines:  10-22 lt fem a line>> 10-22 rt fem one port cvl>> 10-22 lt ij cvl>>  A: Post PEA arresst, r/o cardiogenic pulm edema, suspect HTN emergency with diastolic failure TTE [id number] >> intact LV fxn (55-60%) with LVH  P:  -Hypothermia protocol completed -restart clonidine and lisinopril as BP will allow - volume removal by HD  RENAL  Lab 07/14/12 0330 07/13/12 0400 07/12/12 2350 07/12/12 1940 07/12/12 1600  NA 137 139 140 141 137  K 4.2 3.7 -- -- --  CL 99 98 100 100 96  CO2 26 26 26 24 26   BUN 35* 29* 27* 23 18  CREATININE 4.64* 4.33* 4.04* 3.38* 2.51*  CALCIUM 7.9* 7.4* 7.5* 7.5* 8.0*  MG 2.2 2.0 -- -- --  PHOS 3.2 5.6* -- -- --   Intake/Output      10/24 0701 - 10/25 0700 10/25 0701 - 10/26 0700   I.V. (mL/kg) 608.5 (7.5) 45 (0.6)   IV Piggyback 60 10   Total Intake(mL/kg) 668.5 (8.2) 55 (0.7)   Urine (mL/kg/hr) 5 (0)    Emesis/NG output 100     Other 2145    Total Output 2250    Net -1581.5 +55        Stool Occurrence 5 x     Foley: none  A:  ESRD, pulm edema  P:   - appreciate renal support, HD  - arinesp as ordered  GASTROINTESTINAL  Lab 07/11/12 1207  AST 46*  ALT 26  ALKPHOS 114  BILITOT 0.4  PROT 6.6  ALBUMIN 3.0*    A:  At risk gastric ulcer P:  -ppi   HEMATOLOGIC  Lab 07/14/12 0330 07/13/12 1030 07/12/12 2350 07/12/12 0352 07/11/12 2249 07/11/12 2230 07/11/12 1512 07/11/12 1207  HGB 7.3* 8.7* 9.0* 10.2* 10.2* -- -- --  HCT 23.4* 27.8* 28.4* 30.0* 30.0* -- -- --  PLT 99* 88* 88* -- -- -- -- 154  INR -- -- -- -- -- 1.39 1.40 --  APTT -- -- -- -- -- 34 55* --  A: Anemia, no bleeding noted. Due to critical illness Thrombocytopenia - 48h post admit noted 07/12/12, stable  LE doppler 10/24 >> negative DVT  P:  - monitor cbc and tx for< 7.0 - HIT panel >>  - d/c heparin sq and in HD  INFECTIOUS  Lab 07/14/12 0330 07/13/12 1030 07/12/12 2350 07/11/12 1207  WBC 6.5 5.2 6.8 12.0*  PROCALCITON -- -- -- --   Cultures: 10-22 bc>> 10-22 sputum>> ? collected  Results for orders placed during the hospital encounter of 07/11/12  MRSA PCR SCREENING     Status: Normal   Collection Time   07/11/12  4:05 PM      Component Value Range Status Comment   MRSA by PCR NEGATIVE  NEGATIVE Final   CULTURE, BLOOD (ROUTINE X 2)     Status: Normal (Preliminary result)   Collection Time   07/11/12  4:56 PM      Component Value Range Status Comment   Specimen Description BLOOD CENTRAL LINE   Final    Special Requests BOTTLES DRAWN AEROBIC AND ANAEROBIC 10CC   Final    Culture  Setup Time 07/12/2012 03:05   Final    Culture     Final    Value:        BLOOD CULTURE RECEIVED NO GROWTH TO DATE CULTURE WILL BE HELD FOR 5 DAYS BEFORE ISSUING A FINAL NEGATIVE REPORT   Report Status PENDING   Incomplete   CULTURE, BLOOD (ROUTINE X 2)     Status: Normal (Preliminary result)   Collection Time   07/11/12  5:28 PM       Component Value Range Status Comment   Specimen Description BLOOD ARM RIGHT   Final    Special Requests BOTTLES DRAWN AEROBIC ONLY 3CC   Final    Culture  Setup Time 07/12/2012 03:05   Final    Culture     Final    Value:        BLOOD CULTURE RECEIVED NO GROWTH TO DATE CULTURE WILL BE HELD FOR 5 DAYS BEFORE ISSUING A FINAL NEGATIVE REPORT   Report Status PENDING   Incomplete     Antibiotics: Ceftazx 10/22>> Vanc  10/22>> 10/24  Anti-infectives     Start     Dose/Rate Route Frequency Ordered Stop   07/13/12 1500   cefTAZidime (FORTAZ) 2 g in dextrose 5 % 50 mL IVPB        2 g 100 mL/hr over 30 Minutes Intravenous Every Hemodialysis 07/13/12 1423     07/13/12 1500   cefTAZidime (FORTAZ) 2 g in dextrose 5 % 50 mL IVPB  Status:  Discontinued        2 g 100 mL/hr over 30 Minutes Intravenous  Once 07/13/12 1435 07/13/12 1457   07/13/12 1015   vancomycin (VANCOCIN) IVPB 1000 mg/200 mL premix        1,000 mg 200 mL/hr over 60 Minutes Intravenous  Once 07/13/12 1002 07/13/12 1122   07/12/12 1600   vancomycin (VANCOCIN) IVPB 1000 mg/200 mL premix  Status:  Discontinued        1,000 mg 200 mL/hr over 60 Minutes Intravenous Every Hemodialysis 07/12/12 1204 07/13/12 0955   07/12/12 1600   cefTAZidime (FORTAZ) 2 g in dextrose 5 % 50 mL IVPB  Status:  Discontinued        2 g 100 mL/hr over 30 Minutes Intravenous Every Hemodialysis 07/12/12 1204 07/13/12 1423   07/11/12 2000   vancomycin (VANCOCIN) 1,500 mg in sodium  chloride 0.9 % 500 mL IVPB        1,500 mg 250 mL/hr over 120 Minutes Intravenous  Once 07/11/12 1447 07/11/12 2300   07/11/12 2000   cefTAZidime (FORTAZ) 2 g in dextrose 5 % 50 mL IVPB        2 g 100 mL/hr over 30 Minutes Intravenous  Once 07/11/12 1447 07/11/12 2059           A:  Empirical abx for possible HCAP; suspicion higher for pulm edema; cx's NGTD P:   Continue ceftaz another 24 hours, d/c if CXR clearing, no further evidence clinical PNA  ENDOCRINE  Lab  07/14/12 0818 07/14/12 0327 07/13/12 2341 07/13/12 1937 07/13/12 1648  GLUCAP 109* 140* 145* 160* 172*   A:  DM  P:   -SSI  NEUROLOGIC  A:  AMS post arrest, high risk anoxic injury; MS intact am 10/25 with some lethargy P:   - intermittent sedation and follow MS  BEST PRACTICE / DISPOSITION Level of Care:  ICU Primary Service:  PCCM Consultants:  Renal( schertz)/Cards Harwani Code Status:  full Diet:  npo DVT Px:  scd GI Px:  ppi Skin Integrity:  intact Social / Family:  None at bedside   The patient is critically ill with multiple organ systems failure and requires high complexity decision making for assessment and support, frequent evaluation and titration of therapies, application of advanced monitoring technologies and extensive interpretation of multiple databases.   Critical Care Time devoted to patient care services described in this note is  35  Minutes.  Baltazar Apo, MD, PhD 07/14/2012, 11:22 AM [facility] [phone] or if no answer [phone]

## 2012-07-14 NOTE — Progress Notes (Addendum)
125cc Fentanyl wasted in sink with 2 RN's.

## 2012-07-14 NOTE — Progress Notes (Signed)
Subjective: On vent. No new events overnight. ETT suctioned without purulent secretions today per RN.  Objective Vital signs in last 24 hours: Filed Vitals:   07/14/12 0700 07/14/12 0800 07/14/12 0900 07/14/12 1000  BP:      Pulse: 98 98 103 110  Temp:  99.1 F (37.3 C)    TempSrc:  Oral    Resp: 14 20 28 26   Height:      Weight:      SpO2: 100% 100% 99% 98%   Weight change: -4.8 kg (-10 lb 9.3 oz)  Intake/Output Summary (Last 24 hours) at 07/14/12 1049 Last data filed at 07/14/12 1000  Gross per 24 hour  Intake  609.2 ml  Output    100 ml  Net  509.2 ml   Labs: Basic Metabolic Panel:  Lab [id number] 07/13/12 0400 07/12/12 2350 07/12/12 1940 07/12/12 1600 07/12/12 1155 07/12/12 0920  NA 137 139 140 141 137 136 136  K 4.2 3.7 3.6 3.3* 2.9* 4.3 4.4  CL 99 98 100 100 96 97 97  CO2 26 26 26 24 26 25 24   GLUCOSE 139* 143* 150* 161* 157* 126* 129*  BUN 35* 29* 27* 23 18 38* 36*  CREATININE 4.64* 4.33* 4.04* 3.38* 2.51* 5.52* 5.28*  ALB -- -- -- -- -- -- --  CALCIUM 7.9* 7.4* 7.5* 7.5* 8.0* 7.8* 7.9*  PHOS 3.2 5.6* -- -- -- -- --   Liver Function Tests:  Lab 07/11/12 1207  AST 46*  ALT 26  ALKPHOS 114  BILITOT 0.4  PROT 6.6  ALBUMIN 3.0*   No results found for this basename: LIPASE:3,AMYLASE:3 in the last 168 hours No results found for this basename: AMMONIA:3 in the last 168 hours CBC:  Lab 07/14/12 0330 07/13/12 1030 07/12/12 2350 07/12/12 0352 07/11/12 1207  WBC 6.5 5.2 6.8 -- 12.0*  NEUTROABS -- -- -- -- 6.0  HGB 7.3* 8.7* 9.0* 10.2* --  HCT 23.4* 27.8* 28.4* 30.0* --  MCV 74.3* 75.1* 73.8* -- 76.9*  PLT 99* 88* 88* -- 154   PT/INR: @labrcntip (inr:5) Cardiac Enzymes:  Lab 07/12/12 2355 07/12/12 1641 07/12/12 1155 07/12/12 0607 07/12/12 0030  CKTOTAL 40 68 66 86 103  CKMB 4.3* 5.7* 4.8* 4.8* 4.5*  CKMBINDEX -- -- -- -- --  TROPONINI <0.30 <0.30 0.33* 0.62* 0.77*   CBG:  Lab 07/14/12 0818 07/14/12 0327 07/13/12 2341 07/13/12 1937 07/13/12 1648    GLUCAP 109* 140* 145* 160* 172*    Iron Studies: No results found for this basename: IRON:30,TIBC:30,TRANSFERRIN:30,FERRITIN:30 in the last 168 hours  Physical Exam:  Blood pressure 134/54, pulse 110, temperature 99.1 F (37.3 C), temperature source Oral, resp. rate 26, height 5\' 2"  (1.575 m), weight 81.5 kg (179 lb 10.8 oz), SpO2 98.00%.  Gen: intubated, opening eyes Skin: no rash, cyanosis Neck: no visible JVD, no bruits or LAN  Chest: clear bilat, no rales or wheezing  Heart: regular, no rub or gallop, no murmur noted  Abdomen: soft, obese, nondistended  Ext: No LE edema. Good bruit over LUA AVF.  Neuro: Unresponsive  Outpatient HD: [facility], EDW 84kg, 4 hrs, 2.0K/2.25Ca, LUA AVF. Heparin 8400, EPO 16k, Venofer 50 mg/thurs, Zemplar 9ug. Optiflux 160, BFR450, Qd 1.5x.   Impression/Plan  1. Acute respiratory failure- pulm edema and prob PNA also. On empiric vanc/fortaz IV. Improving. 2. S/P arrest 3. CKD, hd tts via AVF. HD tomorrow, use heparin. Volume excess resolved, 3kg below dry weight now. CXR read as worse today, but looks  better to me.   4. HTN- takes nifedipine/clonidine/metoprolol at home, on hold now.  BP improving, resume bp meds as needed. 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 Anemia- on darbepoeitin 100/wk here, IV iron weekly. Hb lower 7.3, ?transfuse- defer to CCM.  7. DM2- on insulin, per primary 8. Hypothyroidism   Kelly Splinter  MD [facility] [phone] pgr    [phone] cell 07/14/2012, 10:49 AM

## 2012-07-14 NOTE — Progress Notes (Signed)
Subjective:  Patient awake intubated denies any complaints  Objective:  Vital Signs in the last 24 hours: Temp:  [99.1 F (37.3 C)-101.3 F (38.5 C)] 99.1 F (37.3 C) (10/25 0800) Pulse Rate:  [98-113] 105  (10/25 1400) Resp:  [13-28] 16  (10/25 1400) BP: (134-138)/(53-54) 134/54 mmHg (10/25 0337) SpO2:  [96 %-100 %] 100 % (10/25 1400) Arterial Line BP: (104-156)/(44-63) 125/50 mmHg (10/25 1400) FiO2 (%):  [40 %] 40 % (10/25 1400) Weight:  [81.5 kg (179 lb 10.8 oz)] 81.5 kg (179 lb 10.8 oz) (10/25 0400)  Intake/Output from previous day: 10/24 0701 - 10/25 0700 In: 668.5 [I.V.:608.5; IV Piggyback:60] Out: 2250 [Urine:5; Emesis/NG output:100] Intake/Output from this shift: Total I/O In: 65 [I.V.:55; IV Piggyback:10] Out: -   Physical Exam: Neck: no adenopathy, no carotid bruit, no JVD and supple, symmetrical, trachea midline Lungs: Clear to auscultation anterolaterally Heart: regular rate and rhythm, S1, S2 normal and Soft systolic murmur noted Abdomen: soft, non-tender; bowel sounds normal; no masses,  no organomegaly Extremities: extremities normal, atraumatic, no cyanosis or edema  Lab Results:  Basename 07/14/12 0330 07/13/12 1030  WBC 6.5 5.2  HGB 7.3* 8.7*  PLT 99* 88*    Basename 07/14/12 0330 07/13/12 0400  NA 137 139  K 4.2 3.7  CL 99 98  CO2 26 26  GLUCOSE 139* 143*  BUN 35* 29*  CREATININE 4.64* 4.33*    Basename 07/12/12 2355 07/12/12 1641  TROPONINI <0.30 <0.30   Hepatic Function Panel No results found for this basename: PROT,ALBUMIN,AST,ALT,ALKPHOS,BILITOT,BILIDIR,IBILI in the last 72 hours No results found for this basename: CHOL in the last 72 hours No results found for this basename: PROTIME in the last 72 hours  Imaging: Imaging results have been reviewed and Dg Chest Port 1 View  07/14/2012  *RADIOLOGY REPORT*  Clinical Data: Follow up edema.  PORTABLE CHEST - 1 VIEW  Comparison: 07/13/2012  Findings: Endotracheal tube 3 cm above the  carina.  Venous catheter tip in the SVC and unchanged.  NG in the stomach.  No pneumothorax  Increase in bilateral airspace disease.  This may represent edema or pneumonia.  Small pleural effusions are present.  IMPRESSION: Worsening bilateral airspace disease may represent pneumonia or edema.   Original Report Authenticated By: Truett Perna, M.D.    Dg Chest Port 1 View  07/13/2012  *RADIOLOGY REPORT*  Clinical Data: Assess endotracheal tube placement.  PORTABLE CHEST - 1 VIEW  Comparison: Chest x-ray 07/12/2012.  Findings: Endotracheal tube remains in a slightly low position, approximately 2.0 cm above the carina. There is a left-sided internal jugular central venous catheter with tip terminating in the proximal superior vena cava.A nasogastric tube is seen extending into the stomach, however, the tip of the nasogastric tube extends below the lower margin of the image.  Lung volumes are low.  Persistent bibasilar opacities may represent areas of atelectasis and/or consolidation, superimposed upon moderate left and small right-sided pleural effusions.  There continues to be some cephalization of the pulmonary vasculature, and indistinctness of interstitial markings, suggesting interstitial pulmonary edema. Heart size is mildly enlarged (unchanged).  Upper mediastinal contours are unremarkable.  Atherosclerosis in the thoracic aorta. Transcutaneous pacing pads are seen projecting over the left hemithorax.  IMPRESSION: 1.  Support apparatus, as above.  Endotracheal tube remains a slightly low position only 2 cm above the carina.  Consider withdrawal approximate 2 cm for more optimal placement. 2.  Slight improvement in underlying congestive heart failure with slight decrease in pulmonary  edema and decrease in size of bilateral pleural effusions, as above. 3.  Persistent bibasilar opacities may reflect areas of bilateral lower lobe atelectasis and/or consolidation.   Original Report Authenticated By: Etheleen Mayhew, M.D.     Cardiac Studies:  Assessment/Plan:  Status post PEA cardiac arrest  Resolving Acute pulmonary edema rule out ischemia /MI ruled out  Acute respiratory failure secondary to above rule out aspiration  Hypertension  Diabetes matters  Morbid obesity  End-stage renal disease on hemodialysis  Hypothyroidism  GERD  Diabetic neuropathy   Plan Continue present management per CCM Dr. Doylene Canard on call for me for weekend  LOS: 3 days    Soledad Budreau N 07/14/2012, 2:55 PM

## 2012-07-15 ENCOUNTER — Inpatient Hospital Stay (HOSPITAL_COMMUNITY): Payer: Medicare Other

## 2012-07-15 LAB — BASIC METABOLIC PANEL
BUN: 55 mg/dL — ABNORMAL HIGH (ref 6–23)
Calcium: 8.9 mg/dL (ref 8.4–10.5)
GFR calc Af Amer: 7 mL/min — ABNORMAL LOW (ref >90)
GFR calc non Af Amer: 6 mL/min — ABNORMAL LOW (ref >90)
Potassium: 4.2 mEq/L (ref 3.5–5.1)

## 2012-07-15 LAB — GLUCOSE, CAPILLARY
Glucose-Capillary: 87 mg/dL (ref 70–99)
Glucose-Capillary: 113 mg/dL — ABNORMAL HIGH (ref 70–99)

## 2012-07-15 LAB — CBC
HCT: 24.1 % — ABNORMAL LOW (ref 36.0–46.0)
Platelets: 109 10*3/uL — ABNORMAL LOW (ref 150–400)
RDW: 20 % — ABNORMAL HIGH (ref 11.5–15.5)
WBC: 9.9 10*3/uL (ref 4.0–10.5)

## 2012-07-15 LAB — PHOSPHORUS: Phosphorus: 5.5 mg/dL — ABNORMAL HIGH (ref 2.3–4.6)

## 2012-07-15 LAB — MAGNESIUM: Magnesium: 2.6 mg/dL — ABNORMAL HIGH (ref 1.5–2.5)

## 2012-07-15 LAB — TSH: TSH: 0.755 u[IU]/mL (ref 0.350–4.500)

## 2012-07-15 MED ORDER — FENTANYL CITRATE 0.05 MG/ML IJ SOLN
25.0000 ug | INTRAMUSCULAR | Status: DC | PRN
  Administered 2012-07-15: 25 ug via INTRAVENOUS
  Filled 2012-07-15: qty 2

## 2012-07-15 MED ORDER — METOPROLOL TARTRATE 1 MG/ML IV SOLN
5.0000 mg | Freq: Once | INTRAVENOUS | Status: AC
  Administered 2012-07-15: 5 mg via INTRAVENOUS
  Filled 2012-07-15: qty 5

## 2012-07-15 MED ORDER — CLONIDINE HCL 0.3 MG PO TABS
0.3000 mg | ORAL_TABLET | Freq: Two times a day (BID) | ORAL | Status: DC
  Administered 2012-07-15 – 2012-07-20 (×8): 0.3 mg via ORAL
  Filled 2012-07-15 (×11): qty 1

## 2012-07-15 MED ORDER — MIDAZOLAM HCL 2 MG/2ML IJ SOLN
0.5000 mg | INTRAMUSCULAR | Status: DC | PRN
  Administered 2012-07-15: 0.5 mg via INTRAVENOUS
  Filled 2012-07-15: qty 2

## 2012-07-15 MED ORDER — CLONIDINE HCL 0.2 MG PO TABS
0.2000 mg | ORAL_TABLET | Freq: Two times a day (BID) | ORAL | Status: DC
  Administered 2012-07-15: 0.2 mg via ORAL
  Filled 2012-07-15 (×2): qty 1

## 2012-07-15 MED ORDER — DEXTROSE 5 % IV SOLN
2.0000 g | INTRAVENOUS | Status: DC | PRN
  Administered 2012-07-15: 2 g via INTRAVENOUS
  Filled 2012-07-15: qty 2

## 2012-07-15 MED ORDER — METOPROLOL TARTRATE 50 MG PO TABS
50.0000 mg | ORAL_TABLET | Freq: Two times a day (BID) | ORAL | Status: DC
  Administered 2012-07-15 – 2012-07-20 (×9): 50 mg
  Filled 2012-07-15 (×12): qty 1

## 2012-07-15 MED ORDER — PARICALCITOL 5 MCG/ML IV SOLN
INTRAVENOUS | Status: AC
  Administered 2012-07-15: 9 ug
  Filled 2012-07-15: qty 2

## 2012-07-15 MED ORDER — NIFEDIPINE ER 60 MG PO TB24
60.0000 mg | ORAL_TABLET | Freq: Two times a day (BID) | ORAL | Status: DC
  Administered 2012-07-15: 60 mg via ORAL
  Filled 2012-07-15 (×4): qty 1

## 2012-07-15 NOTE — Progress Notes (Signed)
ANTIBIOTIC CONSULT NOTE - FOLLOW UP  Pharmacy Consult for Ceftazidime Indication: ?Bacteremia/line infection  No Known Allergies  Patient Measurements: Height: 5\' 2"  (157.5 cm) Weight: 181 lb 10.5 oz (82.4 kg) IBW/kg (Calculated) : 50.1   Vital Signs: Temp: 99.2 F (37.3 C) (10/26 0659) Temp src: Oral (10/26 0659) BP: 142/63 mmHg (10/26 0709) Pulse Rate: 115  (10/26 0709) Intake/Output from previous day: 10/25 0701 - 10/26 0700 In: 95 [I.V.:85; IV Piggyback:10] Out: 350 [Emesis/NG output:350] Intake/Output from this shift:    Labs:  Basename 07/15/12 0452 07/14/12 0330 07/13/12 1030 07/13/12 0400  WBC 9.9 6.5 5.2 --  HGB 7.5* 7.3* 8.7* --  PLT 109* 99* 88* --  LABCREA -- -- -- --  CREATININE 6.98* 4.64* -- 4.33*   Estimated Creatinine Clearance: 9.5 ml/min (by C-G formula based on Cr of 6.98).  Assessment: 51yof s/p cardiac arrest + arctic sun protocol now fully rewarmed continues on day # 5 ceftazidime (vancomycin d/c'ed by CCM on 10/24). CXR shows improvement, blood cultures remain negative. She was febrile to 101.3 last night but afebrile so far this morning. She has been tolerating full HD sessions.  10/22 Vancomycin>>10/24 10/22 Ceftazidime>> 10/22 blood cx>>ngtd  Goal of Therapy:  Appropriate ceftazidime dosing for HD  Plan:  1) Continue ceftazidime 2g IV qHD 2) Follow up LOT  Deboraha Sprang 07/15/2012,7:37 AM

## 2012-07-15 NOTE — Progress Notes (Signed)
Agitation   Versed prn and Fentanyl prn ordered

## 2012-07-15 NOTE — Progress Notes (Signed)
Subjective:  Awake, intubated. T max 99.2 F.  On 40 % O2. On hemodialysis today.   Objective:  Vital Signs in the last 24 hours: Temp:  [98.7 F (37.1 C)-99.2 F (37.3 C)] 99.2 F (37.3 C) (10/26 0659) Pulse Rate:  [98-144] 124  (10/26 0915) Cardiac Rhythm:  [-] Sinus tachycardia (10/26 0800) Resp:  [12-31] 17  (10/26 0915) BP: (139-217)/(52-88) 186/70 mmHg (10/26 0915) SpO2:  [98 %-100 %] 100 % (10/26 0915) Arterial Line BP: (125-225)/(50-87) 171/71 mmHg (10/26 0800) FiO2 (%):  [40 %] 40 % (10/26 0810) Weight:  [80.7 kg (177 lb 14.6 oz)-82.4 kg (181 lb 10.5 oz)] 82.4 kg (181 lb 10.5 oz) (10/26 [id number])  Physical Exam: BP Readings from Last 1 Encounters:  07/15/12 186/70    Wt Readings from Last 1 Encounters:  07/15/12 82.4 kg (181 lb 10.5 oz)    Weight change: -4.9 kg (-10 lb 12.8 oz)  HEENT: [place]/AT, Eyes-Brown, PERL, EOMI, Conjunctiva-Pink, Sclera-Non-icteric. Intubated. Neck: No JVD, No bruit, Trachea midline. Lungs:  Clear, Bilateral. Cardiac:  Regular rhythm, normal S1 and S2, no S3. II/VI systolic murmur. Abdomen:  Soft, non-tender. Extremities:  Trace edema present. No cyanosis. No clubbing. CNS: AxOx3, Cranial nerves grossly intact, moves all 4 extremities. Right handed. Skin: Warm and dry.   Intake/Output from previous day: 10/25 0701 - 10/26 0700 In: 95 [I.V.:85; IV Piggyback:10] Out: 350 [Emesis/NG output:350]    Lab Results: BMET    Component Value Date/Time   NA 139 07/15/2012 0452   K 4.2 07/15/2012 0452   CL 98 07/15/2012 0452   CO2 22 07/15/2012 0452   GLUCOSE 126* 07/15/2012 0452   BUN 55* 07/15/2012 0452   CREATININE 6.98* 07/15/2012 0452   CALCIUM 8.9 07/15/2012 0452   GFRNONAA 6* 07/15/2012 0452   GFRAA 7* 07/15/2012 0452   CBC    Component Value Date/Time   WBC 9.9 07/15/2012 0452   RBC 3.34* 07/15/2012 0452   HGB 7.5* 07/15/2012 0452   HCT 24.1* 07/15/2012 0452   PLT 109* 07/15/2012 0452   MCV 72.2* 07/15/2012 0452   MCH 22.5*  07/15/2012 0452   MCHC 31.1 07/15/2012 0452   RDW 20.0* 07/15/2012 0452   LYMPHSABS 5.1* 07/11/2012 1207   MONOABS 0.4 07/11/2012 1207   EOSABS 0.4 07/11/2012 1207   BASOSABS 0.0 07/11/2012 1207   CARDIAC ENZYMES Lab Results  Component Value Date   CKTOTAL 40 07/12/2012   CKMB 4.3* 07/12/2012   TROPONINI <0.30 07/12/2012    Assessment/Plan:  Patient Active Hospital Problem List: Acute pulmonary edema rule out ischemia /MI ruled out  Acute respiratory failure, possible pneumonia.  Hypertension  Diabetes mellitus, II Obesity  End-stage renal disease on hemodialysis  Hypothyroidism  GERD  Diabetic neuropathy  Anemia of chronic renal disease Sinus tachycardia  Small dose B-blocker use. Awaiting extubation.    LOS: 4 days    Dixie Dials  MD  07/15/2012, 9:27 AM

## 2012-07-15 NOTE — Progress Notes (Signed)
Subjective: On vent. No new events overnight. CXR today is clear. BP high, CVP low (3), sinus tach 100-110.   Objective Vital signs in last 24 hours: Filed Vitals:   07/15/12 0730 07/15/12 0745 07/15/12 0800 07/15/12 0815  BP: 166/71 170/72 170/70 208/83  Pulse: 114 120 121 121  Temp:      TempSrc:      Resp: 14 15 16 18   Height:      Weight:      SpO2: 100% 100% 100% 100%   Weight change: -4.9 kg (-10 lb 12.8 oz)  Intake/Output Summary (Last 24 hours) at 07/15/12 0848 Last data filed at 07/15/12 0500  Gross per 24 hour  Intake     80 ml  Output    350 ml  Net   -270 ml   Labs: Basic Metabolic Panel:  Lab [id number] 07/14/12 0330 07/13/12 0400 07/12/12 2350 07/12/12 1940 07/12/12 1600 07/12/12 1155  NA 139 137 139 140 141 137 136  K 4.2 4.2 3.7 3.6 3.3* 2.9* 4.3  CL 98 99 98 100 100 96 97  CO2 22 26 26 26 24 26 25   GLUCOSE 126* 139* 143* 150* 161* 157* 126*  BUN 55* 35* 29* 27* 23 18 38*  CREATININE 6.98* 4.64* 4.33* 4.04* 3.38* 2.51* 5.52*  ALB -- -- -- -- -- -- --  CALCIUM 8.9 7.9* 7.4* 7.5* 7.5* 8.0* 7.8*  PHOS 5.5* 3.2 5.6* -- -- -- --   Liver Function Tests:  Lab 07/11/12 1207  AST 46*  ALT 26  ALKPHOS 114  BILITOT 0.4  PROT 6.6  ALBUMIN 3.0*   No results found for this basename: LIPASE:3,AMYLASE:3 in the last 168 hours No results found for this basename: AMMONIA:3 in the last 168 hours CBC:  Lab 07/15/12 0452 07/14/12 0330 07/13/12 1030 07/12/12 2350 07/11/12 1207  WBC 9.9 6.5 5.2 6.8 --  NEUTROABS -- -- -- -- 6.0  HGB 7.5* 7.3* 8.7* 9.0* --  HCT 24.1* 23.4* 27.8* 28.4* --  MCV 72.2* 74.3* 75.1* 73.8* --  PLT 109* 99* 88* 88* --   PT/INR: @labrcntip (inr:5) Cardiac Enzymes:  Lab 07/12/12 2355 07/12/12 1641 07/12/12 1155 07/12/12 0607 07/12/12 0030  CKTOTAL 40 68 66 86 103  CKMB 4.3* 5.7* 4.8* 4.8* 4.5*  CKMBINDEX -- -- -- -- --  TROPONINI <0.30 <0.30 0.33* 0.62* 0.77*   CBG:  Lab 07/15/12 0726 07/15/12 0351 07/14/12 2327 07/14/12 1926  07/14/12 1635  GLUCAP 124* 103* 87 104* 101*    Iron Studies: No results found for this basename: IRON:30,TIBC:30,TRANSFERRIN:30,FERRITIN:30 in the last 168 hours  Physical Exam:  Blood pressure 208/83, pulse 121, temperature 99.2 F (37.3 C), temperature source Oral, resp. rate 18, height 5\' 2"  (1.575 m), weight 82.4 kg (181 lb 10.5 oz), SpO2 100.00%.  Gen: intubated, opening eyes, grips bilat Skin: no rash, cyanosis Neck: no visible JVD, no bruits or LAN  Chest: clear bilat, no rales or wheezing  Heart: regular, no rub or gallop, no murmur noted  Abdomen: soft, obese, nondistended  Ext: No LE edema. Good bruit over LUA AVF.   Outpatient HD: [place] [facility], EDW 84kg, 4 hrs, 2.0K/2.25Ca, LUA AVF. Heparin 8400, EPO 16k, Venofer 50 mg/thurs, Zemplar 9ug. Optiflux 160, BFR450, Qd 1.5x.   Impression/Plan  1. Acute respiratory failure- pulm edema and possible PNA. CXR much better. On empiric vanc/fortaz IV. 2. S/P arrest and cooling protocol 3. CKD, hd tts via AVF. HD today, use heparin. Volume excess resolved, 2kg below dry  weight now.  Keep even w HD today. 4. HTN- BP high. She takes nifedipine 60bid, clonidine 0.2bid and metoprolol 100/d at home; the lisinopril was recently stopped on 10/9 prior to admission. I've updated home med list with correct meds and dosing.  Will restart nifedipine and clonidine per NG.  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 Anemia- on darbepoeitin 100/wk here, IV iron weekly. Hb 7's.  7. DM2- on insulin, per primary 8. Hypothyroidism   Kelly Splinter  MD [facility] [phone] pgr    [phone] cell 07/15/2012, 8:48 AM

## 2012-07-15 NOTE — Procedures (Signed)
I was present at this dialysis session. I have reviewed the session itself and made appropriate changes.   Kelly Splinter, MD [employer] 07/15/2012, 9:00 AM

## 2012-07-15 NOTE — Progress Notes (Signed)
Name: [name] MRN: [id number] DOB: 09-Apr-1961    LOS: 4  Referring Provider:  EDP Reason for Referral: PEA arrest  PULMONARY / CRITICAL CARE MEDICINE  HPI:  51 yo AAM who was receiving HD 10-22. Developed agonal breathing, then PEA arrest. Cpr and epi with return of pulses. Greater 6 min cpr time likely. Unresponsive. Tx to [facility] ED and PCCM asked to evaluate and admit. Treated for pulm edema +/- PNA.   Events Since Admission: 10/22 PEA arrrest 10/22- refractory hypoxia and ARCTIC SUN 10/23- rewarming 8pm 10/24- prelim echo: good LV fun per Dr Terrence Dupont  SUBJECTIVE/OVERNIGHT/INTERVAL HX Awake and tolerating PSV Profound weakness despite improved MS On HD right now  Vital Signs: Temp:  [98.7 F (37.1 C)-99.2 F (37.3 C)] 99.2 F (37.3 C) (10/26 0659) Pulse Rate:  [98-144] 125  (10/26 1030) Resp:  [12-31] 21  (10/26 1030) BP: (139-217)/(52-89) 194/70 mmHg (10/26 1030) SpO2:  [100 %] 100 % (10/26 1030) Arterial Line BP: (125-225)/(50-87) 171/71 mmHg (10/26 0800) FiO2 (%):  [40 %] 40 % (10/26 0810) Weight:  [80.7 kg (177 lb 14.6 oz)-82.4 kg (181 lb 10.5 oz)] 82.4 kg (181 lb 10.5 oz) (10/26 0659)  Intake/Output Summary (Last 24 hours) at 07/15/12 1101 Last data filed at 07/15/12 0500  Gross per 24 hour  Intake     40 ml  Output    350 ml  Net   -310 ml    Physical Examination: General: critical and chronically unwell looking female Neuro: follows some commands but can barely move UE's despite attempt HEENT:  No LAN Neck:  + JVD major Cardiovascular:  HSR RRR Lungs:  CTA bilaterally Abdomen:  obese Musculoskeletal:  intact Skin:  Cool   Dg Chest Port 1 View  07/15/2012  *RADIOLOGY REPORT*  Clinical Data: Respiratory distress.  PORTABLE CHEST - 1 VIEW  Comparison: 07/14/2012  Findings: Shallow inspiration.  Borderline heart size with normal pulmonary vascularity.  Improving perihilar infiltrates since previous study.  No blunting of costophrenic angles.  No  pneumothorax.  Endotracheal tube with tip about 4.6 cm above the carina.  The enteric tube tip is not visible but is below the left hemidiaphragm consistent with location at least in the stomach.  A left central venous catheter with tip over the mid SVC region.  IMPRESSION: Improving perihilar air space disease since previous study.   Original Report Authenticated By: Neale Burly, M.D.    Dg Chest Port 1 View  07/14/2012  *RADIOLOGY REPORT*  Clinical Data: Follow up edema.  PORTABLE CHEST - 1 VIEW  Comparison: 07/13/2012  Findings: Endotracheal tube 3 cm above the carina.  Venous catheter tip in the SVC and unchanged.  NG in the stomach.  No pneumothorax  Increase in bilateral airspace disease.  This may represent edema or pneumonia.  Small pleural effusions are present.  IMPRESSION: Worsening bilateral airspace disease may represent pneumonia or edema.   Original Report Authenticated By: Truett Perna, M.D.     Active Problems:  Cardiac arrest  ESRD (end stage renal disease)  HTN (hypertension)  Obesity  DM (diabetes mellitus)  Acute respiratory failure with hypoxia   ASSESSMENT AND PLAN  PULMONARY  Lab 07/12/12 0356 07/11/12 2241 07/11/12 2114 07/11/12 1447 07/11/12 1321  PHART 7.416 7.517* 7.608* 7.452* 7.297*  PCO2ART 41.8 28.7* 24.2* 34.1* 52.4*  PO2ART 82.0 120.0* 559.0* 211.0* 51.0*  HCO3 28.0* 24.9* 25.4* 24.3* 25.6*  O2SAT 98.0 99.0 100.0 100.0 82.0   Ventilator Settings: Vent Mode:  [-]  PRVC FiO2 (%):  [40 %] 40 % Set Rate:  [12 bmp] 12 bmp Vt Set:  [460 mL] 460 mL PEEP:  [5 cmH20] 5 cmH20 Pressure Support:  [10 cmH20] 10 cmH20 Plateau Pressure:  [17 cmH20-19 cmH20] 17 cmH20  ETT:  10/22>>  A:  Post PEA arrest with apparent pulmonary edema, Refractory hypoxia, Acute resp failure; CXR and oxygenation have improved with volume removal P:   -SBT as tolerated, goal extubation 10/26 after HD, but her overall weakness is a concern -Continued UF via HD to goal dry  wt   CARDIOVASCULAR  Lab 07/12/12 2355 07/12/12 1641 07/12/12 1155 07/12/12 0607 07/12/12 0030 07/11/12 1207  TROPONINI <0.30 <0.30 0.33* 0.62* 0.77* --  LATICACIDVEN -- -- -- -- -- 8.1*  PROBNP -- -- -- -- -- --   ECG:  SINUS RHYTHM ~ normal P axis, V-rate 50- 99 BORDERLINE PROLONGED QT INTERVAL ~ QTc >444mS Lines:  10-22 lt fem a line>> 10-22 rt fem one port cvl>> 10-22 lt ij cvl>>  A: Post PEA arresst, r/o cardiogenic pulm edema, suspect HTN emergency with diastolic failure TTE [id number] >> intact LV fxn (55-60%) with LVH HTN  P:  -Hypothermia protocol completed -on clonidine > increase to 0.3, restart metoprolol 10/26 -volume removal by HD  RENAL  Lab 07/15/12 0452 07/14/12 0330 07/13/12 0400 07/12/12 2350 07/12/12 1940  NA 139 137 139 140 141  K 4.2 4.2 -- -- --  CL 98 99 98 100 100  CO2 22 26 26 26 24   BUN 55* 35* 29* 27* 23  CREATININE 6.98* 4.64* 4.33* 4.04* 3.38*  CALCIUM 8.9 7.9* 7.4* 7.5* 7.5*  MG 2.6* 2.2 2.0 -- --  PHOS 5.5* 3.2 5.6* -- --   Intake/Output      10/25 0701 - 10/26 0700 10/26 0701 - 10/27 0700   I.V. (mL/kg) 85 (1)    IV Piggyback 10    Total Intake(mL/kg) 95 (1.2)    Urine (mL/kg/hr)     Emesis/NG output 350    Other     Total Output 350    Net -255          Foley: none  A:  ESRD, pulm edema  P:   - appreciate renal support, HD  - arinesp as ordered  GASTROINTESTINAL  Lab 07/11/12 1207  AST 46*  ALT 26  ALKPHOS 114  BILITOT 0.4  PROT 6.6  ALBUMIN 3.0*    A:  At risk gastric ulcer P:  -ppi   HEMATOLOGIC  Lab 07/15/12 0452 07/14/12 0330 07/13/12 1030 07/12/12 2350 07/12/12 0352 07/11/12 2230 07/11/12 1512 07/11/12 1207  HGB 7.5* 7.3* 8.7* 9.0* 10.2* -- -- --  HCT 24.1* 23.4* 27.8* 28.4* 30.0* -- -- --  PLT 109* 99* 88* 88* -- -- -- 154  INR -- -- -- -- -- 1.39 1.40 --  APTT -- -- -- -- -- 34 55* --   A: Anemia, no bleeding noted. Due to critical illness Thrombocytopenia - 48h post admit noted 07/12/12, stable    LE doppler 10/24 >> negative DVT  P:  - monitor cbc and tx for< 7.0 - HIT panel >>  - d/c'd heparin sq and in HD  INFECTIOUS  Lab 07/15/12 0452 07/14/12 0330 07/13/12 1030 07/12/12 2350 07/11/12 1207  WBC 9.9 6.5 5.2 6.8 12.0*  PROCALCITON -- -- -- -- --   Cultures: 10-22 bc>> 10-22 sputum>> ? collected  Results for orders placed during the hospital encounter of 07/11/12  MRSA PCR  SCREENING     Status: Normal   Collection Time   07/11/12  4:05 PM      Component Value Range Status Comment   MRSA by PCR NEGATIVE  NEGATIVE Final   CULTURE, BLOOD (ROUTINE X 2)     Status: Normal (Preliminary result)   Collection Time   07/11/12  4:56 PM      Component Value Range Status Comment   Specimen Description BLOOD CENTRAL LINE   Final    Special Requests BOTTLES DRAWN AEROBIC AND ANAEROBIC 10CC   Final    Culture  Setup Time 07/12/2012 03:05   Final    Culture     Final    Value:        BLOOD CULTURE RECEIVED NO GROWTH TO DATE CULTURE WILL BE HELD FOR 5 DAYS BEFORE ISSUING A FINAL NEGATIVE REPORT   Report Status PENDING   Incomplete   CULTURE, BLOOD (ROUTINE X 2)     Status: Normal (Preliminary result)   Collection Time   07/11/12  5:28 PM      Component Value Range Status Comment   Specimen Description BLOOD ARM RIGHT   Final    Special Requests BOTTLES DRAWN AEROBIC ONLY 3CC   Final    Culture  Setup Time 07/12/2012 03:05   Final    Culture     Final    Value:        BLOOD CULTURE RECEIVED NO GROWTH TO DATE CULTURE WILL BE HELD FOR 5 DAYS BEFORE ISSUING A FINAL NEGATIVE REPORT   Report Status PENDING   Incomplete     Antibiotics: Ceftaz 10/22>> 10/26 Vanc  10/22>> 10/24  Anti-infectives     Start     Dose/Rate Route Frequency Ordered Stop   07/15/12 0800   cefTAZidime (FORTAZ) 2 g in dextrose 5 % 50 mL IVPB        2 g 100 mL/hr over 30 Minutes Intravenous Every Hemodialysis 07/15/12 0732     07/13/12 1500   cefTAZidime (FORTAZ) 2 g in dextrose 5 % 50 mL IVPB  Status:   Discontinued        2 g 100 mL/hr over 30 Minutes Intravenous Every Hemodialysis 07/13/12 1423 07/15/12 0732   07/13/12 1500   cefTAZidime (FORTAZ) 2 g in dextrose 5 % 50 mL IVPB  Status:  Discontinued        2 g 100 mL/hr over 30 Minutes Intravenous  Once 07/13/12 1435 07/13/12 1457   07/13/12 1015   vancomycin (VANCOCIN) IVPB 1000 mg/200 mL premix        1,000 mg 200 mL/hr over 60 Minutes Intravenous  Once 07/13/12 1002 07/13/12 1122   07/12/12 1600   vancomycin (VANCOCIN) IVPB 1000 mg/200 mL premix  Status:  Discontinued        1,000 mg 200 mL/hr over 60 Minutes Intravenous Every Hemodialysis 07/12/12 1204 07/13/12 0955   07/12/12 1600   cefTAZidime (FORTAZ) 2 g in dextrose 5 % 50 mL IVPB  Status:  Discontinued        2 g 100 mL/hr over 30 Minutes Intravenous Every Hemodialysis 07/12/12 1204 07/13/12 1423   07/11/12 2000   vancomycin (VANCOCIN) 1,500 mg in sodium chloride 0.9 % 500 mL IVPB        1,500 mg 250 mL/hr over 120 Minutes Intravenous  Once 07/11/12 1447 07/11/12 2300   07/11/12 2000   cefTAZidime (FORTAZ) 2 g in dextrose 5 % 50 mL IVPB  2 g 100 mL/hr over 30 Minutes Intravenous  Once 07/11/12 1447 07/11/12 2059           A:  Empirical abx for possible HCAP; suspicion higher for pulm edema; cx's NGTD P:   Stop ceftaz 10/26 given CXR clearing, no further evidence clinical PNA  ENDOCRINE  Lab 07/15/12 0726 07/15/12 0351 07/14/12 2327 07/14/12 1926 07/14/12 1635  GLUCAP 124* 103* 87 104* 101*   A:  DM  P:   -SSI  NEUROLOGIC  A:  AMS post arrest, high risk anoxic injury; MS intact am 10/26 with some lethargy; concern for profound weakness, ? Critical care polymyopathy.  P:   - intermittent sedation and follow MS - check CPK - consider neuro evaluation if no improvement in strength  BEST PRACTICE / DISPOSITION Level of Care:  ICU Primary Service:  PCCM Consultants:  Renal( schertz)/Cards Harwani Code Status:  full Diet:  npo DVT Px:  scd GI  Px:  ppi Skin Integrity:  intact Social / Family:  None at bedside   The patient is critically ill with multiple organ systems failure and requires high complexity decision making for assessment and support, frequent evaluation and titration of therapies, application of advanced monitoring technologies and extensive interpretation of multiple databases.   Critical Care Time devoted to patient care services described in this note is  30 Minutes.  Baltazar Apo, MD, PhD 07/15/2012, 11:01 AM [facility] [phone] or if no answer [phone]

## 2012-07-16 ENCOUNTER — Inpatient Hospital Stay (HOSPITAL_COMMUNITY): Payer: Medicare Other

## 2012-07-16 LAB — CBC
HCT: 19.5 % — ABNORMAL LOW (ref 36.0–46.0)
HCT: 25.1 % — ABNORMAL LOW (ref 36.0–46.0)
Hemoglobin: 6.2 g/dL — CL (ref 12.0–15.0)
Hemoglobin: 7.5 g/dL — ABNORMAL LOW (ref 12.0–15.0)
Hemoglobin: 8 g/dL — ABNORMAL LOW (ref 12.0–15.0)
MCH: 23 pg — ABNORMAL LOW (ref 26.0–34.0)
MCH: 23.7 pg — ABNORMAL LOW (ref 26.0–34.0)
MCHC: 31.8 g/dL (ref 30.0–36.0)
MCV: 72.2 fL — ABNORMAL LOW (ref 78.0–100.0)
Platelets: 141 10*3/uL — ABNORMAL LOW (ref 150–400)
RBC: 3.17 MIL/uL — ABNORMAL LOW (ref 3.87–5.11)
RDW: 19.8 % — ABNORMAL HIGH (ref 11.5–15.5)
WBC: 12.9 10*3/uL — ABNORMAL HIGH (ref 4.0–10.5)
WBC: 11 10*3/uL — ABNORMAL HIGH (ref 4.0–10.5)

## 2012-07-16 LAB — GLUCOSE, CAPILLARY
Glucose-Capillary: 121 mg/dL — ABNORMAL HIGH (ref 70–99)
Glucose-Capillary: 188 mg/dL — ABNORMAL HIGH (ref 70–99)
Glucose-Capillary: 116 mg/dL — ABNORMAL HIGH (ref 70–99)

## 2012-07-16 LAB — BASIC METABOLIC PANEL
BUN: 30 mg/dL — ABNORMAL HIGH (ref 6–23)
BUN: 37 mg/dL — ABNORMAL HIGH (ref 6–23)
CO2: 25 mEq/L (ref 19–32)
CO2: 24 mEq/L (ref 19–32)
Chloride: 99 mEq/L (ref 96–112)
Chloride: 96 mEq/L (ref 96–112)
Creatinine, Ser: 4.22 mg/dL — ABNORMAL HIGH (ref 0.50–1.10)
Creatinine, Ser: 4.84 mg/dL — ABNORMAL HIGH (ref 0.50–1.10)
GFR calc Af Amer: 11 mL/min — ABNORMAL LOW (ref >90)
Glucose, Bld: 115 mg/dL — ABNORMAL HIGH (ref 70–99)
Potassium: 3.4 mEq/L — ABNORMAL LOW (ref 3.5–5.1)
Potassium: 3.3 mEq/L — ABNORMAL LOW (ref 3.5–5.1)

## 2012-07-16 LAB — ABO/RH: ABO/RH(D): A POS

## 2012-07-16 MED ORDER — METOPROLOL TARTRATE 50 MG PO TABS
50.0000 mg | ORAL_TABLET | Freq: Two times a day (BID) | ORAL | Status: DC
  Filled 2012-07-16 (×2): qty 1

## 2012-07-16 MED ORDER — AMLODIPINE BESYLATE 5 MG PO TABS
5.0000 mg | ORAL_TABLET | Freq: Two times a day (BID) | ORAL | Status: DC
  Administered 2012-07-16 – 2012-07-20 (×7): 5 mg via ORAL
  Filled 2012-07-16 (×10): qty 1

## 2012-07-16 MED ORDER — POTASSIUM CHLORIDE 20 MEQ/15ML (10%) PO LIQD
40.0000 meq | Freq: Every day | ORAL | Status: DC
  Filled 2012-07-16: qty 30

## 2012-07-16 MED ORDER — POTASSIUM CHLORIDE CRYS ER 20 MEQ PO TBCR
40.0000 meq | EXTENDED_RELEASE_TABLET | Freq: Once | ORAL | Status: AC
  Administered 2012-07-16: 40 meq via ORAL
  Filled 2012-07-16: qty 2

## 2012-07-16 MED ORDER — BIOTENE DRY MOUTH MT LIQD
15.0000 mL | Freq: Two times a day (BID) | OROMUCOSAL | Status: DC
  Administered 2012-07-17: 15 mL via OROMUCOSAL

## 2012-07-16 MED ORDER — PANTOPRAZOLE SODIUM 40 MG IV SOLR
40.0000 mg | Freq: Two times a day (BID) | INTRAVENOUS | Status: DC
  Administered 2012-07-16: 40 mg via INTRAVENOUS
  Filled 2012-07-16 (×3): qty 40

## 2012-07-16 MED ORDER — KCL IN DEXTROSE-NACL 20-5-0.45 MEQ/L-%-% IV SOLN
INTRAVENOUS | Status: DC
  Administered 2012-07-16: 10:00:00 via INTRAVENOUS
  Filled 2012-07-16 (×2): qty 1000

## 2012-07-16 NOTE — Progress Notes (Signed)
Pt had 2 red mucousy stools. Elink MD was called to report and new orders were received.

## 2012-07-16 NOTE — Progress Notes (Signed)
CRITICAL VALUE ALERT  Critical value received:  hgb 6.2    Date of notification:  07/16/2012   Time of notification:  0430  Critical value read back:yes  Nurse who received alert:  Anjelica Gorniak, Meda Klinefelter  MD notified (1st page):  Dr Deterding  Time of first page:  0430  Time MD responded:  0430  Orders recieved

## 2012-07-16 NOTE — Progress Notes (Signed)
Subjective: Extubated this am, groggy, doesn't remember events. Asking questions.   Objective Vital signs in last 24 hours: Filed Vitals:   07/16/12 1000 07/16/12 1045 07/16/12 1100 07/16/12 1200  BP:      Pulse: 94 73 71 70  Temp:    99.4 F (37.4 C)  TempSrc:    Oral  Resp: 28 18 23 22   Height:      Weight:      SpO2: 99% 100% 100% 99%   Weight change: 1 kg (2 lb 3.3 oz)  Intake/Output Summary (Last 24 hours) at 07/16/12 1232 Last data filed at 07/16/12 1200  Gross per 24 hour  Intake  747.5 ml  Output      0 ml  Net  747.5 ml   Labs: Basic Metabolic Panel:  Lab [id number] 1000 07/16/12 0410 07/15/12 0452 07/14/12 0330 07/13/12 0400 07/12/12 2350 07/12/12 1940  NA 137 139 139 137 139 140 141  K 3.3* 3.4* 4.2 4.2 3.7 3.6 3.3*  CL 96 99 98 99 98 100 100  CO2 24 25 22 26 26 26 24   GLUCOSE 137* 115* 126* 139* 143* 150* 161*  BUN 37* 30* 55* 35* 29* 27* 23  CREATININE 4.84* 4.22* 6.98* 4.64* 4.33* 4.04* 3.38*  ALB -- -- -- -- -- -- --  CALCIUM 9.0 8.7 8.9 7.9* 7.4* 7.5* 7.5*  PHOS -- -- 5.5* 3.2 5.6* -- --   Liver Function Tests:  Lab 07/11/12 1207  AST 46*  ALT 26  ALKPHOS 114  BILITOT 0.4  PROT 6.6  ALBUMIN 3.0*   No results found for this basename: LIPASE:3,AMYLASE:3 in the last 168 hours No results found for this basename: AMMONIA:3 in the last 168 hours CBC:  Lab 07/16/12 1000 07/16/12 0410 07/15/12 0452 07/14/12 0330 07/11/12 1207  WBC 12.9* 10.8* 9.9 6.5 --  NEUTROABS -- -- -- -- 6.0  HGB 7.5* 6.2* 7.5* 7.3* --  HCT 23.4* 19.5* 24.1* 23.4* --  MCV 73.8* 72.2* 72.2* 74.3* --  PLT 141* 140* 109* 99* --   PT/INR: @labrcntip (inr:5) Cardiac Enzymes:  Lab 07/16/12 0410 07/15/12 1650 07/15/12 1100 07/12/12 2355 07/12/12 1641 07/12/12 1155 07/12/12 0607 07/12/12 0030  CKTOTAL 33 -- -- 40 68 66 86 --  CKMB -- -- -- 4.3* 5.7* 4.8* 4.8* 4.5*  CKMBINDEX -- -- -- -- -- -- -- --  TROPONINI -- <0.30 <0.30 <0.30 <0.30 0.33* -- --   CBG:  Lab 07/16/12 1203  07/16/12 0829 07/16/12 0407 07/15/12 2336 07/15/12 1936  GLUCAP 150* 120* 117* 113* 113*    Iron Studies: No results found for this basename: IRON:30,TIBC:30,TRANSFERRIN:30,FERRITIN:30 in the last 168 hours  Physical Exam:  Blood pressure 136/52, pulse 70, temperature 99.4 F (37.4 C), temperature source Oral, resp. rate 22, height 5\' 2"  (1.575 m), weight 79.9 kg (176 lb 2.4 oz), SpO2 99.00%.  Gen: awake, sleepy, responds well, no distress Skin: no rash, cyanosis Neck: no visible JVD, no bruits or LAN  Chest: clear bilat, no rales or wheezing  Heart: regular, no rub or gallop, no murmur noted  Abdomen: soft, obese, nondistended  Ext: No LE edema. Good bruit over LUA AVF.   Outpatient HD: [place] [facility], EDW 84kg, 4 hrs, 2.0K/2.25Ca, LUA AVF. Heparin 8400, EPO 16k, Venofer 50 mg/thurs, Zemplar 9ug. Optiflux 160, BFR450, Qd 1.5x.   Impression/Plan  1. Acute respiratory failure- pulm edema and possible PNA. CXR much better. On empiric vanc/fortaz IV. 2. S/P arrest and cooling protocol 3. CKD, hd tts via  AVF. Next HD Tuesday. Below EDW.  4. HTN- BP high. She takes nifedipine 60bid, clonidine 0.2bid and metoprolol 100/d at home; the lisinopril was recently stopped on 10/9 prior to admission. I've updated home med list with correct h ome meds and dosing.  Restarted nifedipine and clonidine.   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 Anemia- on darbepoeitin 100/wk here, IV iron weekly. Hb 7's.  7. DM2- on insulin, per primary 8. Hypothyroidism   Kelly Splinter  MD [facility] [phone] pgr    [phone] cell 07/16/2012, 12:32 PM

## 2012-07-16 NOTE — Progress Notes (Signed)
Name: [name] MRN: [id number] DOB: July 04, 1961    LOS: 5  Referring Provider:  EDP Reason for Referral: PEA arrest  PULMONARY / CRITICAL CARE MEDICINE  HPI:  51 yo AAM who was receiving HD 10-22. Developed agonal breathing, then PEA arrest. Cpr and epi with return of pulses. Greater 6 min cpr time likely. Unresponsive. Tx to [facility] ED and PCCM asked to evaluate and admit. Treated for pulm edema +/- PNA.   Events Since Admission: 10/22 PEA arrrest 10/22- refractory hypoxia and ARCTIC SUN 10/23- rewarming 8pm 10/24- prelim echo: good LV fun per Dr Terrence Dupont  SUBJECTIVE/OVERNIGHT/INTERVAL HX Received sedation last pm, low volumes on PSV this am Profound weakness despite improved MS  Vital Signs: Temp:  [97.7 F (36.5 C)-99.6 F (37.6 C)] 99.5 F (37.5 C) (10/27 0900) Pulse Rate:  [70-127] 100  (10/27 0915) Resp:  [12-33] 27  (10/27 0915) BP: (92-201)/(52-77) 136/52 mmHg (10/26 1800) SpO2:  [95 %-100 %] 98 % (10/27 0900) Arterial Line BP: (112-202)/(42-72) 182/61 mmHg (10/27 0915) FiO2 (%):  [40 %] 40 % (10/27 0800) Weight:  [79.9 kg (176 lb 2.4 oz)-81.7 kg (180 lb 1.9 oz)] 79.9 kg (176 lb 2.4 oz) (10/27 0100)  Intake/Output Summary (Last 24 hours) at 07/16/12 0947 Last data filed at 07/16/12 0915  Gross per 24 hour  Intake  622.5 ml  Output      2 ml  Net  620.5 ml    Physical Examination: General: critical and chronically unwell looking female Neuro: follows some commands but can barely move UE's despite attempt HEENT:  No LAN Neck:  + JVD major Cardiovascular:  HSR RRR Lungs:  CTA bilaterally Abdomen:  obese Musculoskeletal:  intact Skin:  Cool   Dg Chest Port 1 View  07/16/2012  *RADIOLOGY REPORT*  Clinical Data: Cardiac arrest, intubated  PORTABLE CHEST - 1 VIEW  Comparison:   the previous day's study  Findings: Endotracheal tube, nasogastric tube, and the left IJ central line are stable in position.  Heart size upper limits normal.  Low lung volumes with  some increase in bibasilar consolidation / atelectasis.  No definite effusion.  Question mild central pulmonary vascular congestion.  Vascular stent projects in the left axilla.  IMPRESSION: 1.  Low lung volumes with increasing bibasilar atelectasis/consolidation. 2. Support hardware stable in position.   Original Report Authenticated By: Trecia Rogers, M.D.    Dg Chest Port 1 View  07/15/2012  *RADIOLOGY REPORT*  Clinical Data: Respiratory distress.  PORTABLE CHEST - 1 VIEW  Comparison: 07/14/2012  Findings: Shallow inspiration.  Borderline heart size with normal pulmonary vascularity.  Improving perihilar infiltrates since previous study.  No blunting of costophrenic angles.  No pneumothorax.  Endotracheal tube with tip about 4.6 cm above the carina.  The enteric tube tip is not visible but is below the left hemidiaphragm consistent with location at least in the stomach.  A left central venous catheter with tip over the mid SVC region.  IMPRESSION: Improving perihilar air space disease since previous study.   Original Report Authenticated By: Neale Burly, M.D.     Active Problems:  Cardiac arrest  ESRD (end stage renal disease)  HTN (hypertension)  Obesity  DM (diabetes mellitus)  Acute respiratory failure with hypoxia   ASSESSMENT AND PLAN  PULMONARY  Lab 07/12/12 0356 07/11/12 2241 07/11/12 2114 07/11/12 1447 07/11/12 1321  PHART 7.416 7.517* 7.608* 7.452* 7.297*  PCO2ART 41.8 28.7* 24.2* 34.1* 52.4*  PO2ART 82.0 120.0* 559.0* 211.0* 51.0*  HCO3 28.0* 24.9* 25.4* 24.3* 25.6*  O2SAT 98.0 99.0 100.0 100.0 82.0   Ventilator Settings: Vent Mode:  [-] PSV;CPAP FiO2 (%):  [40 %] 40 % Set Rate:  [12 bmp] 12 bmp Vt Set:  [460 mL] 460 mL PEEP:  [5 cmH20] 5 cmH20 Pressure Support:  [8 cmH20-10 cmH20] 8 cmH20 Plateau Pressure:  [19 cmH20] 19 cmH20  ETT:  10/22>>  A:  Post PEA arrest with apparent pulmonary edema, Refractory hypoxia, Acute resp failure; CXR and  oxygenation have improved with volume removal P:   -SBT as tolerated, goal extubation 10/26 after HD, but her overall weakness and LOC are a concern -Continued UF via HD to goal dry wt   CARDIOVASCULAR  Lab 07/15/12 1650 07/15/12 1100 07/12/12 2355 07/12/12 1641 07/12/12 1155 07/11/12 1207  TROPONINI <0.30 <0.30 <0.30 <0.30 0.33* --  LATICACIDVEN -- -- -- -- -- 8.1*  PROBNP -- -- -- -- -- --   ECG:  SINUS RHYTHM ~ normal P axis, V-rate 50- 99 BORDERLINE PROLONGED QT INTERVAL ~ QTc >453mS Lines:  10-22 lt fem a line>> 10-22 rt fem one port cvl>> 10-22 lt ij cvl>>  A: Post PEA arresst, r/o cardiogenic pulm edema, suspect HTN emergency with diastolic failure TTE [id number] >> intact LV fxn (55-60%) with LVH HTN  P:  -Hypothermia protocol completed -on clonidine > increase to 0.3, restart metoprolol 10/26 -volume removal by HD  RENAL  Lab 07/16/12 0410 07/15/12 0452 07/14/12 0330 07/13/12 0400 07/12/12 2350  NA 139 139 137 139 140  K 3.4* 4.2 -- -- --  CL 99 98 99 98 100  CO2 25 22 26 26 26   BUN 30* 55* 35* 29* 27*  CREATININE 4.22* 6.98* 4.64* 4.33* 4.04*  CALCIUM 8.7 8.9 7.9* 7.4* 7.5*  MG -- 2.6* 2.2 2.0 --  PHOS -- 5.5* 3.2 5.6* --   Intake/Output      10/26 0701 - 10/27 0700 10/27 0701 - 10/28 0700   I.V. (mL/kg)     Blood 12.5 300   NG/GT 310    IV Piggyback     Total Intake(mL/kg) 322.5 (4) 300 (3.8)   Emesis/NG output     Other 2    Total Output 2    Net +320.5 +300        Stool Occurrence 1 x     Foley: none  A:  ESRD, pulm edema  Hypokalemia P:   - appreciate renal support, HD  - arinesp as ordered  GASTROINTESTINAL  Lab 07/11/12 1207  AST 46*  ALT 26  ALKPHOS 114  BILITOT 0.4  PROT 6.6  ALBUMIN 3.0*    A:  At risk gastric ulcer P:  -ppi   HEMATOLOGIC  Lab 07/16/12 0410 07/15/12 0452 07/14/12 0330 07/13/12 1030 07/12/12 2350 07/11/12 2230 07/11/12 1512  HGB 6.2* 7.5* 7.3* 8.7* 9.0* -- --  HCT 19.5* 24.1* 23.4* 27.8* 28.4* -- --  PLT  140* 109* 99* 88* 88* -- --  INR -- -- -- -- -- 1.39 1.40  APTT -- -- -- -- -- 34 55*   A: Anemia, no bleeding noted. Due to critical illness Thrombocytopenia - 48h post admit noted 07/12/12, stable  LE doppler 10/24 >> negative DVT  P:  - monitor cbc and tx for< 7.0 - HIT panel >>  - d/c'd heparin sq and in HD  INFECTIOUS  Lab 07/16/12 0410 07/15/12 0452 07/14/12 0330 07/13/12 1030 07/12/12 2350  WBC 10.8* 9.9 6.5 5.2 6.8  PROCALCITON -- -- -- -- --  Cultures: 10-22 bc>> 10-22 sputum>> ? collected  Results for orders placed during the hospital encounter of 07/11/12  MRSA PCR SCREENING     Status: Normal   Collection Time   07/11/12  4:05 PM      Component Value Range Status Comment   MRSA by PCR NEGATIVE  NEGATIVE Final   CULTURE, BLOOD (ROUTINE X 2)     Status: Normal (Preliminary result)   Collection Time   07/11/12  4:56 PM      Component Value Range Status Comment   Specimen Description BLOOD CENTRAL LINE   Final    Special Requests BOTTLES DRAWN AEROBIC AND ANAEROBIC 10CC   Final    Culture  Setup Time 07/12/2012 03:05   Final    Culture     Final    Value:        BLOOD CULTURE RECEIVED NO GROWTH TO DATE CULTURE WILL BE HELD FOR 5 DAYS BEFORE ISSUING A FINAL NEGATIVE REPORT   Report Status PENDING   Incomplete   CULTURE, BLOOD (ROUTINE X 2)     Status: Normal (Preliminary result)   Collection Time   07/11/12  5:28 PM      Component Value Range Status Comment   Specimen Description BLOOD ARM RIGHT   Final    Special Requests BOTTLES DRAWN AEROBIC ONLY 3CC   Final    Culture  Setup Time 07/12/2012 03:05   Final    Culture     Final    Value:        BLOOD CULTURE RECEIVED NO GROWTH TO DATE CULTURE WILL BE HELD FOR 5 DAYS BEFORE ISSUING A FINAL NEGATIVE REPORT   Report Status PENDING   Incomplete     Antibiotics: Ceftaz 10/22>> 10/26 Vanc  10/22>> 10/24  Anti-infectives     Start     Dose/Rate Route Frequency Ordered Stop   07/15/12 0800   cefTAZidime  (FORTAZ) 2 g in dextrose 5 % 50 mL IVPB  Status:  Discontinued        2 g 100 mL/hr over 30 Minutes Intravenous Every Hemodialysis 07/15/12 0732 07/15/12 1118   07/13/12 1500   cefTAZidime (FORTAZ) 2 g in dextrose 5 % 50 mL IVPB  Status:  Discontinued        2 g 100 mL/hr over 30 Minutes Intravenous Every Hemodialysis 07/13/12 1423 07/15/12 0732   07/13/12 1500   cefTAZidime (FORTAZ) 2 g in dextrose 5 % 50 mL IVPB  Status:  Discontinued        2 g 100 mL/hr over 30 Minutes Intravenous  Once 07/13/12 1435 07/13/12 1457   07/13/12 1015   vancomycin (VANCOCIN) IVPB 1000 mg/200 mL premix        1,000 mg 200 mL/hr over 60 Minutes Intravenous  Once 07/13/12 1002 07/13/12 1122   07/12/12 1600   vancomycin (VANCOCIN) IVPB 1000 mg/200 mL premix  Status:  Discontinued        1,000 mg 200 mL/hr over 60 Minutes Intravenous Every Hemodialysis 07/12/12 1204 07/13/12 0955   07/12/12 1600   cefTAZidime (FORTAZ) 2 g in dextrose 5 % 50 mL IVPB  Status:  Discontinued        2 g 100 mL/hr over 30 Minutes Intravenous Every Hemodialysis 07/12/12 1204 07/13/12 1423   07/11/12 2000   vancomycin (VANCOCIN) 1,500 mg in sodium chloride 0.9 % 500 mL IVPB        1,500 mg 250 mL/hr over 120 Minutes Intravenous  Once 07/11/12 1447  07/11/12 2300   07/11/12 2000   cefTAZidime (FORTAZ) 2 g in dextrose 5 % 50 mL IVPB        2 g 100 mL/hr over 30 Minutes Intravenous  Once 07/11/12 1447 07/11/12 2059           A:  Empirical abx for possible HCAP; suspicion higher for pulm edema; cx's NGTD P:   Stop ceftaz 10/26 given CXR clearing, no further evidence clinical PNA  ENDOCRINE  Lab 07/16/12 0829 07/16/12 0407 07/15/12 2336 07/15/12 1936 07/15/12 1540  GLUCAP 120* 117* 113* 113* 124*   A:  DM  P:   -SSI  NEUROLOGIC  A:  AMS post arrest, high risk anoxic injury; MS intact am 10/26 with some lethargy; concern for profound weakness, ? Critical care polymyopathy, CK 33 P:   - intermittent sedation and  follow MS >> MINIMIZE - check CPK - consider neuro evaluation if no improvement in strength  BEST PRACTICE / DISPOSITION Level of Care:  ICU Primary Service:  PCCM Consultants:  Renal( schertz)/Cards Harwani Code Status:  full Diet:  npo DVT Px:  scd GI Px:  ppi Skin Integrity:  intact Social / Family:  None at bedside   The patient is critically ill with multiple organ systems failure and requires high complexity decision making for assessment and support, frequent evaluation and titration of therapies, application of advanced monitoring technologies and extensive interpretation of multiple databases.   Critical Care Time devoted to patient care services described in this note is  30 Minutes.  Baltazar Apo, MD, PhD 07/16/2012, 9:47 AM [facility] [phone] or if no answer [phone]

## 2012-07-16 NOTE — Significant Event (Signed)
Name: [name] MRN: [id number] DOB: 02/26/1961  ELECTRONIC ICU PHYSICIAN NOTE  Problem:  BRBPR, last Hb 7.5 this AM after transfusion.  Hb 6.2 prior to transfusion.  Hemodynamically stable.  Intervention:  Asked Dr.Sequeros to evaluate.  Will place NG tube and lavage.  Increased Protonix to BID.  GI on call paged.  CBC recheck at 22:00.  Penne Lash, M.D. [facility] [place] Cell: [phone] Pager: [phone]  07/16/2012, 7:51 PM

## 2012-07-16 NOTE — Progress Notes (Signed)
eLink Physician-Brief Progress Note Patient Name: [name] DOB: 1961-06-29 MRN: [id number]  Date of Service  07/16/2012   HPI/Events of Note  Drop in Hgb to 6.2 from 7.5 following prolonged bleeding from dialysis graft site.  eICU Interventions  Plan: Transfuse 1 unit of pRBC Post-transfusion CBC/BMET   Intervention Category Intermediate Interventions: Bleeding - evaluation and treatment with blood products  Anyela Napierkowski 07/16/2012, 4:31 AM

## 2012-07-16 NOTE — Procedures (Addendum)
Extubation Procedure Note  Patient Details:   Name: [name] DOB: 08-14-1961 MRN: [id number]   Airway Documentation:  Airway 8 mm (Active)  Secured at (cm) 23 cm 07/16/2012  7:57 AM  Measured From Lips 07/16/2012  7:57 AM  Secured Location Right 07/16/2012  7:57 AM  Secured By [employer] 07/16/2012  7:57 AM  Tube Holder Repositioned Yes 07/16/2012  7:57 AM  Cuff Pressure (cm H2O) 24 cm H2O 07/16/2012  7:57 AM  Site Condition Dry 07/13/2012  8:00 PM     Airway (Active)  Secured at (cm) 24 cm 07/11/2012  2:52 PM  Measured From Lips 07/11/2012  2:52 PM  Secured Location Left 07/11/2012  2:52 PM  Secured By [employer] 07/11/2012  2:52 PM  Tube Holder Repositioned Yes 07/11/2012 11:00 AM    Evaluation  O2 sats: stable throughout and currently acceptable Complications: No apparent complications Patient did tolerate procedure well. Bilateral Breath Sounds: Clear;Diminished Suctioning: Oral;Airway Yes  RT checked cuff leak prior to extubation.  No cuff leak was heard.  Dr Lamonte Sakai stated that it was ok to proceed with extubation.  RT suctioned mouth and down ETT prior to extubation. Patient was able to state name clearly and cough up some thin clear secretions after extubation. BBS clear, diminished. No stridor noted at this time.  Placed patient on 4L [place].  Sats 100%.  RT will continue to monitor.  Marilynne Halsted 07/16/2012, 10:43 AM

## 2012-07-16 NOTE — Progress Notes (Signed)
Order received for PT eval.  Pt extubated this AM.  PT to f/u tomorrow, to see if pt ready to begin PT.  Lorrin Goodell, PT  Office # [phone] Pager [phone]

## 2012-07-16 NOTE — Progress Notes (Signed)
Subjective:  Awake. Awaiting extubation. T max 99.6 F  Objective:  Vital Signs in the last 24 hours: Temp:  [97.7 F (36.5 C)-99.6 F (37.6 C)] 99.5 F (37.5 C) (10/27 0900) Pulse Rate:  [70-127] 100  (10/27 0915) Cardiac Rhythm:  [-] Normal sinus rhythm (10/27 0800) Resp:  [12-33] 27  (10/27 0915) BP: (92-201)/(52-89) 136/52 mmHg (10/26 1800) SpO2:  [95 %-100 %] 98 % (10/27 0900) Arterial Line BP: (112-202)/(42-72) 182/61 mmHg (10/27 0915) FiO2 (%):  [40 %] 40 % (10/27 0800) Weight:  [79.9 kg (176 lb 2.4 oz)-81.7 kg (180 lb 1.9 oz)] 79.9 kg (176 lb 2.4 oz) (10/27 0100)  Physical Exam: BP Readings from Last 1 Encounters:  07/15/12 136/52    Wt Readings from Last 1 Encounters:  07/16/12 79.9 kg (176 lb 2.4 oz)    Weight change: 1 kg (2 lb 3.3 oz)  HEENT: [place]/AT, Eyes-Brown, PERL, EOMI, Conjunctiva-Pale, Sclera-Non-icteric Neck: No JVD, No bruit, Trachea midline. Lungs:  Clear, Bilateral. Cardiac:  Regular rhythm, normal S1 and S2, no S3.  Abdomen:  Soft, non-tender. Extremities:  No edema present. No cyanosis. No clubbing. CNS: AxOx3, Cranial nerves grossly intact, moves all 4 extremities. Right handed. Skin: Warm and dry.   Intake/Output from previous day: 10/26 0701 - 10/27 0700 In: 322.5 [Blood:12.5; NG/GT:310] Out: 2     Lab Results: BMET    Component Value Date/Time   NA 139 07/16/2012 0410   K 3.4* 07/16/2012 0410   CL 99 07/16/2012 0410   CO2 25 07/16/2012 0410   GLUCOSE 115* 07/16/2012 0410   BUN 30* 07/16/2012 0410   CREATININE 4.22* 07/16/2012 0410   CALCIUM 8.7 07/16/2012 0410   GFRNONAA 11* 07/16/2012 0410   GFRAA 13* 07/16/2012 0410   CBC    Component Value Date/Time   WBC 10.8* 07/16/2012 0410   RBC 2.70* 07/16/2012 0410   HGB 6.2* 07/16/2012 0410   HCT 19.5* 07/16/2012 0410   PLT 140* 07/16/2012 0410   MCV 72.2* 07/16/2012 0410   MCH 23.0* 07/16/2012 0410   MCHC 31.8 07/16/2012 0410   RDW 19.8* 07/16/2012 0410   LYMPHSABS 5.1*  07/11/2012 1207   MONOABS 0.4 07/11/2012 1207   EOSABS 0.4 07/11/2012 1207   BASOSABS 0.0 07/11/2012 1207   CARDIAC ENZYMES Lab Results  Component Value Date   CKTOTAL 33 07/16/2012   CKMB 4.3* 07/12/2012   TROPONINI <0.30 07/15/2012    Assessment/Plan:  Patient Active Hospital Problem List:  Acute pulmonary edema rule out ischemia /MI ruled out  Acute respiratory failure, possible pneumonia.  Hypertension  Diabetes mellitus, II  Obesity  End-stage renal disease on hemodialysis  Hypothyroidism  GERD  Diabetic neuropathy  Anemia of chronic renal disease  Sinus tachycardia  IV fluid, K+ and metoprolol   LOS: 5 days    Dixie Dials  MD  07/16/2012, 9:28 AM

## 2012-07-17 ENCOUNTER — Inpatient Hospital Stay (HOSPITAL_COMMUNITY): Payer: Medicare Other

## 2012-07-17 ENCOUNTER — Encounter (HOSPITAL_COMMUNITY): Payer: Self-pay | Admitting: Radiology

## 2012-07-17 LAB — TYPE AND SCREEN
ABO/RH(D): A POS
Antibody Screen: NEGATIVE
Unit division: 0

## 2012-07-17 LAB — CBC
MCH: 23.9 pg — ABNORMAL LOW (ref 26.0–34.0)
MCHC: 32.8 g/dL (ref 30.0–36.0)
Platelets: 149 10*3/uL — ABNORMAL LOW (ref 150–400)
RBC: 3.14 MIL/uL — ABNORMAL LOW (ref 3.87–5.11)
RDW: 19.4 % — ABNORMAL HIGH (ref 11.5–15.5)

## 2012-07-17 LAB — COMPREHENSIVE METABOLIC PANEL
ALT: 10 U/L (ref 0–35)
AST: 15 U/L (ref 0–37)
Albumin: 2.2 g/dL — ABNORMAL LOW (ref 3.5–5.2)
CO2: 25 mEq/L (ref 19–32)
Calcium: 8.8 mg/dL (ref 8.4–10.5)
Sodium: 133 mEq/L — ABNORMAL LOW (ref 135–145)
Total Protein: 5.8 g/dL — ABNORMAL LOW (ref 6.0–8.3)

## 2012-07-17 LAB — HEPARIN INDUCED THROMBOCYTOPENIA PNL: Heparin Induced Plt Ab: POSITIVE

## 2012-07-17 LAB — GLUCOSE, CAPILLARY
Glucose-Capillary: 124 mg/dL — ABNORMAL HIGH (ref 70–99)
Glucose-Capillary: 127 mg/dL — ABNORMAL HIGH (ref 70–99)

## 2012-07-17 MED ORDER — LIDOCAINE-PRILOCAINE 2.5-2.5 % EX CREA: 1.0000 "application " | TOPICAL_CREAM | CUTANEOUS | Status: DC | PRN

## 2012-07-17 MED ORDER — ALTEPLASE 2 MG IJ SOLR: 2.0000 mg | Freq: Once | INTRAMUSCULAR | Status: DC | PRN

## 2012-07-17 MED ORDER — SODIUM CHLORIDE 0.9 % IV SOLN: 100.0000 mL | INTRAVENOUS | Status: DC | PRN

## 2012-07-17 MED ORDER — SODIUM CHLORIDE 0.9 % IV SOLN: INTRAVENOUS | Status: DC

## 2012-07-17 MED ORDER — NEPRO/CARBSTEADY PO LIQD: 237.0000 mL | ORAL | Status: DC | PRN

## 2012-07-17 MED ORDER — LEVOTHYROXINE SODIUM 100 MCG PO TABS
100.0000 ug | ORAL_TABLET | Freq: Every day | ORAL | Status: DC
  Administered 2012-07-18 – 2012-07-20 (×2): 100 ug via ORAL
  Filled 2012-07-17 (×4): qty 1

## 2012-07-17 MED ORDER — PARICALCITOL 5 MCG/ML IV SOLN
9.0000 ug | INTRAVENOUS | Status: DC
  Administered 2012-07-18 – 2012-07-20 (×2): 9 ug via INTRAVENOUS
  Filled 2012-07-17 (×2): qty 1.8

## 2012-07-17 MED ORDER — PANTOPRAZOLE SODIUM 40 MG PO TBEC
40.0000 mg | DELAYED_RELEASE_TABLET | Freq: Every day | ORAL | Status: DC
  Administered 2012-07-17 – 2012-07-20 (×3): 40 mg via ORAL
  Filled 2012-07-17 (×3): qty 1

## 2012-07-17 MED ORDER — HEPARIN SODIUM (PORCINE) 1000 UNIT/ML DIALYSIS: 1000.0000 [IU] | INTRAMUSCULAR | Status: DC | PRN

## 2012-07-17 MED ORDER — PENTAFLUOROPROP-TETRAFLUOROETH EX AERO: 1.0000 "application " | INHALATION_SPRAY | CUTANEOUS | Status: DC | PRN

## 2012-07-17 MED ORDER — LIDOCAINE HCL (PF) 1 % IJ SOLN: 5.0000 mL | INTRAMUSCULAR | Status: DC | PRN

## 2012-07-17 MED ORDER — PEG 3350-KCL-NA BICARB-NACL 420 G PO SOLR: 4000.0000 mL | Freq: Once | ORAL | Status: DC

## 2012-07-17 MED ORDER — PEG 3350-KCL-NA BICARB-NACL 420 G PO SOLR
4000.0000 mL | Freq: Once | ORAL | Status: AC
  Administered 2012-07-17: 4000 mL via ORAL
  Filled 2012-07-17: qty 4000

## 2012-07-17 NOTE — Progress Notes (Signed)
Patient ID: [name], female   DOB: 07-09-1961, 51 y.o.   MRN: [id number]  [facility] Progress Note    Subjective:   Still coughing but feels much better.  Doesn't remember anything that happened   Objective:   BP 152/51  Pulse 70  Temp 98.3 F (36.8 C) (Oral)  Resp 15  Ht 5\' 2"  (1.575 m)  Wt 79.9 kg (176 lb 2.4 oz)  BMI 32.22 kg/m2  SpO2 100%  Physical Exam: Gen:WD WN AAF in NAD CVS:RRR Resp:CTA [id number] Ext:no edema, LUE AVG +T/B  Labs: BMET  Lab 07/17/12 0440 07/16/12 1000 07/16/12 0410 07/15/12 0452 07/14/12 0330 07/13/12 0400 07/12/12 2350 07/11/12 1207  NA 133* 137 139 139 137 139 140 --  K 3.4* 3.3* 3.4* 4.2 4.2 3.7 3.6 --  CL 95* 96 99 98 99 98 100 --  CO2 25 24 25 22 26 26 26  --  GLUCOSE 132* 137* 115* 126* 139* 143* 150* --  BUN 48* 37* 30* 55* 35* 29* 27* --  CREATININE 5.96* 4.84* 4.22* 6.98* 4.64* 4.33* 4.04* --  ALBUMIN 2.2* -- -- -- -- -- -- 3.0*  CALCIUM 8.8 9.0 8.7 8.9 7.9* 7.4* 7.5* --  PHOS 4.9* -- -- 5.5* 3.2 5.6* -- --   CBC  Lab 07/17/12 0440 07/16/12 2100 07/16/12 1000 07/16/12 0410 07/11/12 1207  WBC 8.9 11.0* 12.9* 10.8* --  NEUTROABS -- -- -- -- 6.0  HGB 7.5* 8.0* 7.5* 6.2* --  HCT 22.9* 25.1* 23.4* 19.5* --  MCV 72.9* 74.0* 73.8* 72.2* --  PLT 149* 181 141* 140* --    @IMGRELPRIORS @ Medications:      . amLODipine  5 mg Oral BID  . antiseptic oral rinse  15 mL Mouth Rinse BID  . cloNIDine  0.3 mg Oral BID  . darbepoetin  100 mcg Intravenous Q Tue-HD  . ferric gluconate (FERRLECIT/NULECIT) IV  62.5 mg Intravenous Q Tue-HD  . insulin aspart  0-9 Units Subcutaneous Q4H  . levothyroxine  62.5 mcg Intravenous Daily  . metoprolol tartrate  50 mg Per Tube BID  . pantoprazole (PROTONIX) IV  40 mg Intravenous Q12H  . paricalcitol  9 mcg Intravenous 3 times weekly  . potassium chloride  40 mEq Oral Once  . DISCONTD: antiseptic oral rinse  15 mL Mouth Rinse QID  . DISCONTD: chlorhexidine  15 mL Mouth Rinse BID    . DISCONTD: metoprolol tartrate  50 mg Oral BID  . DISCONTD: NIFEdipine  60 mg Oral BID  . DISCONTD: pantoprazole (PROTONIX) IV  40 mg Intravenous Daily  . DISCONTD: potassium chloride  40 mEq Per Tube Daily     Assessment/ Plan:   1. VDRF/pulm edema- pt with significant pulm edema/vol overload and respiratory arrest at HD.  Now 8kg below outpt EDW which was not challenged as patient c/o cramping and refused lower EDW.  Agrees to lower EDW at this time and feels much better and is very thankful. 2. ESRD: cont TTS with new EDW ~80kg. No heparin due to ABLA 3. Anemia: with ABLA/acute illness/BRBPR.  Cont with epo and follow iron.  Transfuse prn. GI to eval? 4. SHPTH- stable 5. Nutrition: poor, cont with prot supple 6. Hypertension:stable with UF 7. Vascular access- AVG +T/B 8. Thrombocytopenia- improving 9. DM- per primary svc. 10. Dispo- per primary svc.  Ambulate as tolerated.  Pernell Lenoir A 07/17/2012, 9:14 AM

## 2012-07-17 NOTE — Evaluation (Signed)
Physical Therapy Evaluation Patient Details Name: [name] MRN: [id number] DOB: 1961-04-05 Today's Date: 07/17/2012 Time: [id number] PT Time Calculation (min): 34 min  PT Assessment / Plan / Recommendation Clinical Impression  Patient is a 51 yo female admitted s/p cardiac arrest while at dialysis.  Patient with general weakness impacting mobility.  Patient will benefit with acute PT to maximize independence prior to discharge home with family.  Recommend HHPT and RW.    PT Assessment  Patient needs continued PT services    Follow Up Recommendations  Home health PT;Supervision/Assistance - 24 hour    Does the patient have the potential to tolerate intense rehabilitation      Barriers to Discharge None      Equipment Recommendations  Rolling walker with 5" wheels    Recommendations for Other Services     Frequency Min 3X/week    Precautions / Restrictions Precautions Precautions: None Restrictions Weight Bearing Restrictions: No   Pertinent Vitals/Pain       Mobility  Bed Mobility Bed Mobility: Supine to Sit;Sit to Supine Supine to Sit: 4: Min guard;With rails;HOB elevated Sit to Supine: 4: Min guard;HOB elevated;With rail Details for Bed Mobility Assistance: Verbal cues for technique. Transfers Transfers: Sit to Stand;Stand to Sit Sit to Stand: 4: Min assist;With upper extremity assist;From bed Stand to Sit: 4: Min guard;With upper extremity assist;To bed Details for Transfer Assistance: Verbal cues for hand placement and to scoot to edge of bed before standing.  Assist to initiate standing from low bed. Ambulation/Gait Ambulation/Gait Assistance: 4: Min guard Ambulation Distance (Feet): 72 Feet Assistive device: Rolling walker Ambulation/Gait Assistance Details: Patient with slow, cautious gait pattern.  Cues to stay close to RW, and look forward during gait.  Instruction on safe use of RW, especially in turns. Gait Pattern: Step-through pattern;Decreased  stride length;Trunk flexed Gait velocity: slow gait speed Stairs: No           PT Diagnosis: Difficulty walking;Generalized weakness  PT Problem List: Decreased strength;Decreased activity tolerance;Decreased balance;Decreased mobility;Decreased knowledge of use of DME;Cardiopulmonary status limiting activity PT Treatment Interventions: DME instruction;Gait training;Stair training;Functional mobility training;Balance training;Patient/family education;Therapeutic exercise   PT Goals Acute Rehab PT Goals PT Goal Formulation: With patient Time For Goal Achievement: 07/24/12 Potential to Achieve Goals: Good Pt will go Supine/Side to Sit: Independently;with HOB 0 degrees PT Goal: Supine/Side to Sit - Progress: Goal set today Pt will go Sit to Supine/Side: Independently;with HOB 0 degrees PT Goal: Sit to Supine/Side - Progress: Goal set today Pt will go Sit to Stand: with supervision;with upper extremity assist PT Goal: Sit to Stand - Progress: Goal set today Pt will go Stand to Sit: with supervision;with upper extremity assist PT Goal: Stand to Sit - Progress: Goal set today Pt will Ambulate: >150 feet;with supervision;with least restrictive assistive device PT Goal: Ambulate - Progress: Goal set today Pt will Go Up / Down Stairs: 1-2 stairs;with supervision;with least restrictive assistive device;with rail(s) PT Goal: Up/Down Stairs - Progress: Goal set today  Visit Information  Last PT Received On: 07/17/12 Assistance Needed: +1    Subjective Data  Subjective: "I am ready to get out of bed" Patient Stated Goal: To go home soon   Prior Functioning  Home Living Lives With: Son (Daughter-in-law, grandchildren) Available Help at Discharge: Family;Available 24 hours/day (x2 weeks, then prn with daughter assisting) Type of Home: House Home Access: Stairs to enter [employer] of Steps: 2 Entrance Stairs-Rails: Right;Left Home Layout: Two level;Able to live on main level  with bedroom/bathroom Bathroom Shower/Tub: Chiropodist: Standard Home Adaptive Equipment: None Prior Function Level of Independence: Independent Able to Take Stairs?: Yes Driving: Yes Vocation: On disability Communication Communication: No difficulties    Cognition  Overall Cognitive Status: Appears within functional limits for tasks assessed/performed Arousal/Alertness: Awake/alert Orientation Level: Oriented X4 / Intact Behavior During Session: [facility] for tasks performed    Extremity/Trunk Assessment Right Upper Extremity Assessment RUE ROM/Strength/Tone: WFL for tasks assessed RUE Sensation: WFL - Light Touch Left Upper Extremity Assessment LUE ROM/Strength/Tone: WFL for tasks assessed LUE Sensation: WFL - Light Touch Right Lower Extremity Assessment RLE ROM/Strength/Tone: Deficits RLE ROM/Strength/Tone Deficits: Strength grossly 4-/5 RLE Sensation: WFL - Light Touch Left Lower Extremity Assessment LLE ROM/Strength/Tone: Deficits LLE ROM/Strength/Tone Deficits: Strength grossly 4-/5 LLE Sensation: WFL - Light Touch   Balance Balance Balance Assessed: Yes Static Sitting Balance Static Sitting - Balance Support: No upper extremity supported;Feet supported Static Sitting - Level of Assistance: 5: Stand by assistance Static Sitting - Comment/# of Minutes: Patient able to sit EOB x 7 minutes with good balance with standby assist. Static Standing Balance Static Standing - Balance Support: No upper extremity supported Static Standing - Level of Assistance: 5: Stand by assistance Static Standing - Comment/# of Minutes: Patient able to maintain static standing balance x 3 minutes while preparing for gait.    End of Session PT - End of Session Equipment Utilized During Treatment: Gait belt Activity Tolerance: Patient limited by fatigue Patient left: in bed;with call bell/phone within reach;with family/visitor present Nurse Communication: Mobility status  GP      Despina Pole 07/17/2012, 4:51 PM Carita Pian. Sanjuana Kava, [place] Pager [phone]

## 2012-07-17 NOTE — Consult Note (Signed)
Reason for Consult: Rectal bleeding/anemia Referring Physician: Dr. Charolette Forward  [name] is an 51 y.o. female.  HPI: Patient apparently had rectal bleeding this morning. Had PEA/arrest during dialysis on 10/22. She claims she passed a large amount of BRB this morning. She denies any associated abdominal pain, nausea or vomiting. She usually has 1-2 BM's per day but her stools have been loose today. She has occasional heartburn but denies having any dysphagia or odynophagia. Her appetite is good and her weight has been stable. She claims she had a normal colonoscopy several years ago but cannot remember the name of her gastroenterologist.   Past Medical History  Diagnosis Date  . Renal disorder   . Diabetes mellitus without complication   . Hypertension   . Thyroid disorder   . GERD (gastroesophageal reflux disease)   . Neuropathy   . Hypothyroidism   . Peripheral vascular disease   . Anemia   . Hemodialysis patient   . Hyperlipidemia   . SVT (supraventricular tachycardia)   . Sickle cell trait   . Gastroparesis due to DM    Past Surgical History  Procedure Date  . Thyroidectomy   . Knee arthroplasty   . Cataract extraction, bilateral    History reviewed. No pertinent family history.  Social History:  reports that she has never smoked. She does not have any smokeless tobacco history on file. She reports that she does not drink alcohol or use illicit drugs.  Allergies: No Known Allergies  Medications: I have reviewed the patient's current medications.  Results for orders placed during the hospital encounter of 07/11/12 (from the past 48 hour(s))  GLUCOSE, CAPILLARY     Status: Abnormal   Collection Time   07/15/12  7:36 PM      Component Value Range Comment   Glucose-Capillary 113 (*) 70 - 99 mg/dL   GLUCOSE, CAPILLARY     Status: Abnormal   Collection Time   07/15/12 11:36 PM      Component Value Range Comment   Glucose-Capillary 113 (*) 70 - 99 mg/dL   GLUCOSE,  CAPILLARY     Status: Abnormal   Collection Time   07/16/12  4:07 AM      Component Value Range Comment   Glucose-Capillary 117 (*) 70 - 99 mg/dL   BASIC METABOLIC PANEL     Status: Abnormal   Collection Time   07/16/12  4:10 AM      Component Value Range Comment   Sodium 139  135 - 145 mEq/L    Potassium 3.4 (*) 3.5 - 5.1 mEq/L DELTA CHECK NOTED   Chloride 99  96 - 112 mEq/L    CO2 25  19 - 32 mEq/L    Glucose, Bld 115 (*) 70 - 99 mg/dL    BUN 30 (*) 6 - 23 mg/dL DELTA CHECK NOTED   Creatinine, Ser 4.22 (*) 0.50 - 1.10 mg/dL DELTA CHECK NOTED   Calcium 8.7  8.4 - 10.5 mg/dL    GFR calc non Af Amer 11 (*) >90 mL/min    GFR calc Af Amer 13 (*) >90 mL/min   CBC     Status: Abnormal   Collection Time   07/16/12  4:10 AM      Component Value Range Comment   WBC 10.8 (*) 4.0 - 10.5 K/uL    RBC 2.70 (*) 3.87 - 5.11 MIL/uL    Hemoglobin 6.2 (*) 12.0 - 15.0 g/dL    HCT 19.5 (*) 36.0 - 46.0 %  MCV 72.2 (*) 78.0 - 100.0 fL    MCH 23.0 (*) 26.0 - 34.0 pg    MCHC 31.8  30.0 - 36.0 g/dL    RDW 19.8 (*) 11.5 - 15.5 %    Platelets 140 (*) 150 - 400 K/uL   CK     Status: Normal   Collection Time   07/16/12  4:10 AM      Component Value Range Comment   Total CK 33  7 - 177 U/L   TYPE AND SCREEN     Status: Normal   Collection Time   07/16/12  4:55 AM      Component Value Range Comment   ABO/RH(D) A POS      Antibody Screen NEG      Sample Expiration 07/19/2012      Unit Number [id number]      Blood Component Type RED CELLS,LR      Unit division 00      Status of Unit ISSUED,FINAL      Transfusion Status OK TO TRANSFUSE      Crossmatch Result Compatible     ABO/RH     Status: Normal   Collection Time   07/16/12  4:55 AM      Component Value Range Comment   ABO/RH(D) A POS     PREPARE RBC (CROSSMATCH)     Status: Normal   Collection Time   07/16/12  5:00 AM      Component Value Range Comment   Order Confirmation ORDER PROCESSED BY BLOOD BANK     GLUCOSE, CAPILLARY      Status: Abnormal   Collection Time   07/16/12  8:29 AM      Component Value Range Comment   Glucose-Capillary 120 (*) 70 - 99 mg/dL   BASIC METABOLIC PANEL     Status: Abnormal   Collection Time   07/16/12 10:00 AM      Component Value Range Comment   Sodium 137  135 - 145 mEq/L    Potassium 3.3 (*) 3.5 - 5.1 mEq/L    Chloride 96  96 - 112 mEq/L    CO2 24  19 - 32 mEq/L    Glucose, Bld 137 (*) 70 - 99 mg/dL    BUN 37 (*) 6 - 23 mg/dL    Creatinine, Ser 4.84 (*) 0.50 - 1.10 mg/dL    Calcium 9.0  8.4 - 10.5 mg/dL    GFR calc non Af Amer 10 (*) >90 mL/min    GFR calc Af Amer 11 (*) >90 mL/min   CBC     Status: Abnormal   Collection Time   07/16/12 10:00 AM      Component Value Range Comment   WBC 12.9 (*) 4.0 - 10.5 K/uL    RBC 3.17 (*) 3.87 - 5.11 MIL/uL    Hemoglobin 7.5 (*) 12.0 - 15.0 g/dL    HCT 23.4 (*) 36.0 - 46.0 %    MCV 73.8 (*) 78.0 - 100.0 fL    MCH 23.7 (*) 26.0 - 34.0 pg    MCHC 32.1  30.0 - 36.0 g/dL    RDW 19.8 (*) 11.5 - 15.5 %    Platelets 141 (*) 150 - 400 K/uL   GLUCOSE, CAPILLARY     Status: Abnormal   Collection Time   07/16/12 12:03 PM      Component Value Range Comment   Glucose-Capillary 150 (*) 70 - 99 mg/dL   GLUCOSE, CAPILLARY  Status: Abnormal   Collection Time   07/16/12  3:16 PM      Component Value Range Comment   Glucose-Capillary 121 (*) 70 - 99 mg/dL   GLUCOSE, CAPILLARY     Status: Abnormal   Collection Time   07/16/12  8:09 PM      Component Value Range Comment   Glucose-Capillary 188 (*) 70 - 99 mg/dL   CBC     Status: Abnormal   Collection Time   07/16/12  9:00 PM      Component Value Range Comment   WBC 11.0 (*) 4.0 - 10.5 K/uL    RBC 3.39 (*) 3.87 - 5.11 MIL/uL    Hemoglobin 8.0 (*) 12.0 - 15.0 g/dL    HCT 25.1 (*) 36.0 - 46.0 %    MCV 74.0 (*) 78.0 - 100.0 fL    MCH 23.6 (*) 26.0 - 34.0 pg    MCHC 31.9  30.0 - 36.0 g/dL    RDW 19.6 (*) 11.5 - 15.5 %    Platelets 181  150 - 400 K/uL   GLUCOSE, CAPILLARY     Status:  Abnormal   Collection Time   07/16/12 11:19 PM      Component Value Range Comment   Glucose-Capillary 116 (*) 70 - 99 mg/dL   GLUCOSE, CAPILLARY     Status: Abnormal   Collection Time   07/17/12  4:32 AM      Component Value Range Comment   Glucose-Capillary 127 (*) 70 - 99 mg/dL   CBC     Status: Abnormal   Collection Time   07/17/12  4:40 AM      Component Value Range Comment   WBC 8.9  4.0 - 10.5 K/uL    RBC 3.14 (*) 3.87 - 5.11 MIL/uL    Hemoglobin 7.5 (*) 12.0 - 15.0 g/dL    HCT 22.9 (*) 36.0 - 46.0 %    MCV 72.9 (*) 78.0 - 100.0 fL    MCH 23.9 (*) 26.0 - 34.0 pg    MCHC 32.8  30.0 - 36.0 g/dL    RDW 19.4 (*) 11.5 - 15.5 %    Platelets 149 (*) 150 - 400 K/uL   COMPREHENSIVE METABOLIC PANEL     Status: Abnormal   Collection Time   07/17/12  4:40 AM      Component Value Range Comment   Sodium 133 (*) 135 - 145 mEq/L    Potassium 3.4 (*) 3.5 - 5.1 mEq/L    Chloride 95 (*) 96 - 112 mEq/L    CO2 25  19 - 32 mEq/L    Glucose, Bld 132 (*) 70 - 99 mg/dL    BUN 48 (*) 6 - 23 mg/dL    Creatinine, Ser 5.96 (*) 0.50 - 1.10 mg/dL    Calcium 8.8  8.4 - 10.5 mg/dL    Total Protein 5.8 (*) 6.0 - 8.3 g/dL    Albumin 2.2 (*) 3.5 - 5.2 g/dL    AST 15  0 - 37 U/L    ALT 10  0 - 35 U/L    Alkaline Phosphatase 89  39 - 117 U/L    Total Bilirubin 0.3  0.3 - 1.2 mg/dL    GFR calc non Af Amer 7 (*) >90 mL/min    GFR calc Af Amer 9 (*) >90 mL/min   MAGNESIUM     Status: Normal   Collection Time   07/17/12  4:40 AM      Component  Value Range Comment   Magnesium 2.4  1.5 - 2.5 mg/dL   PHOSPHORUS     Status: Abnormal   Collection Time   07/17/12  4:40 AM      Component Value Range Comment   Phosphorus 4.9 (*) 2.3 - 4.6 mg/dL   GLUCOSE, CAPILLARY     Status: Abnormal   Collection Time   07/17/12  7:44 AM      Component Value Range Comment   Glucose-Capillary 124 (*) 70 - 99 mg/dL   GLUCOSE, CAPILLARY     Status: Abnormal   Collection Time   07/17/12 11:36 AM      Component Value  Range Comment   Glucose-Capillary 159 (*) 70 - 99 mg/dL     Ct Abdomen Pelvis Wo Contrast  07/17/2012  *RADIOLOGY REPORT*  Clinical Data: Abdominal pain.  The patient was recently removed from the ventilator.  CT ABDOMEN AND PELVIS WITHOUT CONTRAST  Technique:  Multidetector CT imaging of the abdomen and pelvis was performed following the standard protocol without intravenous contrast.  Comparison: None.  Findings: Trace bilateral pleural effusions are present.  There is no pericardial effusion.  Dependent basilar airspace disease is likely due to atelectasis.  There is a small volume of scattered abdominal and pelvic ascites. No focal fluid collection is identified.  The gallbladder, liver, spleen and pancreas appear normal.  The kidneys are atrophic bilaterally.  Small bilateral nonobstructing renal stones are identified and there are vascular calcifications about both kidneys.  The patient is status post hysterectomy.  The adnexa are unremarkable.  The stomach, small bowel and appendix appear normal. There is mild circumferential wall thickening of the rectum.  No lymphadenopathy is identified.  No focal bony abnormality is identified.  Bones demonstrates increased density diffusely compatible with renal osteodystrophy.  IMPRESSION:  1.  Trace bilateral pleural effusions.  Dependent bilateral airspace disease is likely due to atelectasis. 2.  Small amount of scattered abdominal and pelvic ascites. 3.  Mild thickening of the walls of the rectum is nonspecific but could be due to inflammatory process. 4.  Renal atrophy and changes of renal osteodystrophy.   Original Report Authenticated By: Arvid Right. D'ALESSIO, M.D.    Dg Chest Port 1 View  07/16/2012  *RADIOLOGY REPORT*  Clinical Data: Cardiac arrest, intubated  PORTABLE CHEST - 1 VIEW  Comparison:   the previous day's study  Findings: Endotracheal tube, nasogastric tube, and the left IJ central line are stable in position.  Heart size upper limits  normal.  Low lung volumes with some increase in bibasilar consolidation / atelectasis.  No definite effusion.  Question mild central pulmonary vascular congestion.  Vascular stent projects in the left axilla.  IMPRESSION: 1.  Low lung volumes with increasing bibasilar atelectasis/consolidation. 2. Support hardware stable in position.   Original Report Authenticated By: Trecia Rogers, M.D.    Review of Systems  Constitutional: Positive for malaise/fatigue. Negative for fever, chills, weight loss and diaphoresis.  HENT: Negative.   Eyes: Negative.   Respiratory: Positive for cough, sputum production and shortness of breath.   Gastrointestinal: Positive for heartburn, diarrhea and blood in stool. Negative for nausea, vomiting and abdominal pain.  Genitourinary: Negative.   Musculoskeletal: Positive for back pain and joint pain.  Skin: Negative.   Neurological: Positive for tingling and weakness. Negative for dizziness, tremors, sensory change, speech change and focal weakness.  Psychiatric/Behavioral: Negative.    Blood pressure 153/68, pulse 85, temperature 97.5 F (36.4 C), temperature source Oral,  resp. rate 25, height 5\' 2"  (1.575 m), weight 81.3 kg (179 lb 3.7 oz), SpO2 96.00%. Physical Exam  Constitutional: She is oriented to person, place, and time. She appears well-developed and well-nourished.  HENT:  Head: Normocephalic and atraumatic.  Eyes: Conjunctivae normal and EOM are normal. Pupils are equal, round, and reactive to light.  Neck: Normal range of motion. Neck supple.  Cardiovascular: Normal rate and regular rhythm.   Respiratory: Effort normal.  GI: Soft. Bowel sounds are normal.  Musculoskeletal: Normal range of motion.  Neurological: She is alert and oriented to person, place, and time.  Skin: Skin is warm and dry.  Psychiatric: She has a normal mood and affect. Her behavior is normal. Judgment and thought content normal.    Assessment/Plan: 1) Rectal  bleeding-painless-will need a colonoscopy. Will prep after she is done with dialysis if she is willing to prep for the procedure.  2) Diabetic gastroparesis.   3) ESRD on HD. 4) DM.  5) Morbid obesity.    Almeda Ezra 07/17/2012, 4:54 PM

## 2012-07-17 NOTE — Progress Notes (Signed)
Subjective:  Patient successfully extubated yesterday doing well denies any chest pain or shortness of breath. States hand left cardiac cath in [place] less than a year ago by Dr. Darrick Meigs and was told to have a mild coronary artery disease and moderate peripheral vascular disease reports are not available.  Objective:  Vital Signs in the last 24 hours: Temp:  [97.9 F (36.6 C)-99.2 F (37.3 C)] 98.9 F (37.2 C) (10/28 1130) Pulse Rate:  [62-102] 83  (10/28 1130) Resp:  [15-24] 24  (10/28 1130) BP: (125-193)/(42-67) 157/60 mmHg (10/28 1130) SpO2:  [92 %-100 %] 95 % (10/28 1130) Arterial Line BP: (147-168)/(47-56) 166/47 mmHg (10/27 1500)  Intake/Output from previous day: 10/27 0701 - 10/28 0700 In: 1625 [P.O.:1010; I.V.:120; Blood:375; NG/GT:120] Out: 202 [Urine:200; Stool:2] Intake/Output from this shift: Total I/O In: 360 [P.O.:360] Out: -   Physical Exam: Neck: no adenopathy, no carotid bruit, no JVD and supple, symmetrical, trachea midline Lungs: Decreased breath sound at bases otherwise air entry improved Heart: regular rate and rhythm, S1, S2 normal and Soft systolic murmur noted Abdomen: soft, non-tender; bowel sounds normal; no masses,  no organomegaly Extremities: extremities normal, atraumatic, no cyanosis or edema  Lab Results:  Basename 07/17/12 0440 07/16/12 2100  WBC 8.9 11.0*  HGB 7.5* 8.0*  PLT 149* 181    Basename 07/17/12 0440 07/16/12 1000  NA 133* 137  K 3.4* 3.3*  CL 95* 96  CO2 25 24  GLUCOSE 132* 137*  BUN 48* 37*  CREATININE 5.96* 4.84*    Basename 07/15/12 1650 07/15/12 1100  TROPONINI <0.30 <0.30   Hepatic Function Panel  Basename 07/17/12 0440  PROT 5.8*  ALBUMIN 2.2*  AST 15  ALT 10  ALKPHOS 89  BILITOT 0.3  BILIDIR --  IBILI --   No results found for this basename: CHOL in the last 72 hours No results found for this basename: PROTIME in the last 72 hours  Imaging: Imaging results have been reviewed and Ct  Abdomen Pelvis Wo Contrast  07/17/2012  *RADIOLOGY REPORT*  Clinical Data: Abdominal pain.  The patient was recently removed from the ventilator.  CT ABDOMEN AND PELVIS WITHOUT CONTRAST  Technique:  Multidetector CT imaging of the abdomen and pelvis was performed following the standard protocol without intravenous contrast.  Comparison: None.  Findings: Trace bilateral pleural effusions are present.  There is no pericardial effusion.  Dependent basilar airspace disease is likely due to atelectasis.  There is a small volume of scattered abdominal and pelvic ascites. No focal fluid collection is identified.  The gallbladder, liver, spleen and pancreas appear normal.  The kidneys are atrophic bilaterally.  Small bilateral nonobstructing renal stones are identified and there are vascular calcifications about both kidneys.  The patient is status post hysterectomy.  The adnexa are unremarkable.  The stomach, small bowel and appendix appear normal. There is mild circumferential wall thickening of the rectum.  No lymphadenopathy is identified.  No focal bony abnormality is identified.  Bones demonstrates increased density diffusely compatible with renal osteodystrophy.  IMPRESSION:  1.  Trace bilateral pleural effusions.  Dependent bilateral airspace disease is likely due to atelectasis. 2.  Small amount of scattered abdominal and pelvic ascites. 3.  Mild thickening of the walls of the rectum is nonspecific but could be due to inflammatory process. 4.  Renal atrophy and changes of renal osteodystrophy.   Original Report Authenticated By: Arvid Right. Luther Parody, M.D.    Dg Chest Port 1 View  07/16/2012  *RADIOLOGY REPORT*  Clinical Data: Cardiac arrest, intubated  PORTABLE CHEST - 1 VIEW  Comparison:   the previous day's study  Findings: Endotracheal tube, nasogastric tube, and the left IJ central line are stable in position.  Heart size upper limits normal.  Low lung volumes with some increase in bibasilar consolidation  / atelectasis.  No definite effusion.  Question mild central pulmonary vascular congestion.  Vascular stent projects in the left axilla.  IMPRESSION: 1.  Low lung volumes with increasing bibasilar atelectasis/consolidation. 2. Support hardware stable in position.   Original Report Authenticated By: Trecia Rogers, M.D.     Cardiac Studies:  Assessment/Plan:  Status post PEA cardiac arrest  Resolving Acute pulmonary edema rule out ischemia /MI ruled out  Acute respiratory failure secondary to above rule out aspiration  Hypertension  Diabetes matters  Morbid obesity  End-stage renal disease on hemodialysis  Hypothyroidism  GERD  Diabetic neuropathy  Peripheral vascular disease Plan Continue present management Schedule for dialysis today Check the old records from Dr Kerman Passey office  LOS: 6 days    Clent Demark 07/17/2012, 12:50 PM

## 2012-07-17 NOTE — Progress Notes (Signed)
Name: [name] MRN: [id number] DOB: January 11, 1961    LOS: 6  Referring Provider:  EDP Reason for Referral: PEA arrest  PULMONARY / CRITICAL CARE MEDICINE  HPI:  51 yo AAM who was receiving HD 10-22. Developed agonal breathing, then PEA arrest. Cpr and epi with return of pulses. Greater 6 min cpr time likely. Unresponsive. Tx to [facility] ED and PCCM asked to evaluate and admit. Treated for pulm edema +/- PNA.   Events Since Admission: 10/22 PEA arrrest 10/22- refractory hypoxia and ARCTIC SUN 10/23- rewarming 8pm 10/24- prelim echo: good LV fun per Dr Terrence Dupont  SUBJECTIVE/OVERNIGHT/INTERVAL HX Received sedation last pm, low volumes on PSV this am Profound weakness despite improved MS  Vital Signs: Temp:  [97.9 F (36.6 C)-99.4 F (37.4 C)] 98.3 F (36.8 C) (10/28 0745) Pulse Rate:  [62-102] 81  (10/28 0900) Resp:  [15-29] 21  (10/28 0900) BP: (125-193)/(42-67) 151/46 mmHg (10/28 0900) SpO2:  [92 %-100 %] 93 % (10/28 0900) Arterial Line BP: (146-181)/(47-60) 166/47 mmHg (10/27 1500)  Intake/Output Summary (Last 24 hours) at 07/17/12 0942 Last data filed at 07/17/12 0900  Gross per 24 hour  Intake   1685 ml  Output    202 ml  Net   1483 ml    Physical Examination: General: critical and chronically unwell looking female Neuro: follows some commands but can barely move UE's despite attempt HEENT:  No LAN Neck:  + JVD major Cardiovascular:  HSR RRR Lungs:  CTA bilaterally Abdomen:  obese Musculoskeletal:  intact Skin:  Cool   Ct Abdomen Pelvis Wo Contrast  07/17/2012  *RADIOLOGY REPORT*  Clinical Data: Abdominal pain.  The patient was recently removed from the ventilator.  CT ABDOMEN AND PELVIS WITHOUT CONTRAST  Technique:  Multidetector CT imaging of the abdomen and pelvis was performed following the standard protocol without intravenous contrast.  Comparison: None.  Findings: Trace bilateral pleural effusions are present.  There is no pericardial effusion.  Dependent  basilar airspace disease is likely due to atelectasis.  There is a small volume of scattered abdominal and pelvic ascites. No focal fluid collection is identified.  The gallbladder, liver, spleen and pancreas appear normal.  The kidneys are atrophic bilaterally.  Small bilateral nonobstructing renal stones are identified and there are vascular calcifications about both kidneys.  The patient is status post hysterectomy.  The adnexa are unremarkable.  The stomach, small bowel and appendix appear normal. There is mild circumferential wall thickening of the rectum.  No lymphadenopathy is identified.  No focal bony abnormality is identified.  Bones demonstrates increased density diffusely compatible with renal osteodystrophy.  IMPRESSION:  1.  Trace bilateral pleural effusions.  Dependent bilateral airspace disease is likely due to atelectasis. 2.  Small amount of scattered abdominal and pelvic ascites. 3.  Mild thickening of the walls of the rectum is nonspecific but could be due to inflammatory process. 4.  Renal atrophy and changes of renal osteodystrophy.   Original Report Authenticated By: Arvid Right. D'ALESSIO, M.D.    Dg Chest Port 1 View  07/16/2012  *RADIOLOGY REPORT*  Clinical Data: Cardiac arrest, intubated  PORTABLE CHEST - 1 VIEW  Comparison:   the previous day's study  Findings: Endotracheal tube, nasogastric tube, and the left IJ central line are stable in position.  Heart size upper limits normal.  Low lung volumes with some increase in bibasilar consolidation / atelectasis.  No definite effusion.  Question mild central pulmonary vascular congestion.  Vascular stent projects in the left axilla.  IMPRESSION: 1.  Low lung volumes with increasing bibasilar atelectasis/consolidation. 2. Support hardware stable in position.   Original Report Authenticated By: Trecia Rogers, M.D.     Active Problems:  Cardiac arrest  ESRD (end stage renal disease)  HTN (hypertension)  Obesity  DM (diabetes  mellitus)  Acute respiratory failure with hypoxia   ASSESSMENT AND PLAN  PULMONARY  Lab 07/12/12 0356 07/11/12 2241 07/11/12 2114 07/11/12 1447 07/11/12 1321  PHART 7.416 7.517* 7.608* 7.452* 7.297*  PCO2ART 41.8 28.7* 24.2* 34.1* 52.4*  PO2ART 82.0 120.0* 559.0* 211.0* 51.0*  HCO3 28.0* 24.9* 25.4* 24.3* 25.6*  O2SAT 98.0 99.0 100.0 100.0 82.0   Ventilator Settings:    ETT:  10/22>>  A:  Post PEA arrest with apparent pulmonary edema, Refractory hypoxia, Acute resp failure; CXR and oxygenation have improved with volume removal P:   -Extubated and doing well. -Continued UF via HD to goal dry wt.  CARDIOVASCULAR  Lab 07/15/12 1650 07/15/12 1100 07/12/12 2355 07/12/12 1641 07/12/12 1155 07/11/12 1207  TROPONINI <0.30 <0.30 <0.30 <0.30 0.33* --  LATICACIDVEN -- -- -- -- -- 8.1*  PROBNP -- -- -- -- -- --   ECG:  SINUS RHYTHM ~ normal P axis, V-rate 50- 99 BORDERLINE PROLONGED QT INTERVAL ~ QTc >414mS Lines:  10-22 lt fem a line>>10/27 10-22 rt fem one port cvl>>10/26 10-22 lt ij cvl>>  A: Post PEA arresst, r/o cardiogenic pulm edema, suspect HTN emergency with diastolic failure TTE [id number] >> intact LV fxn (55-60%) with LVH HTN  P:  -Hypothermia protocol completed -Cont metoprolol, clonidine and norvasc. -Volume removal by HD  RENAL  Lab 07/17/12 0440 07/16/12 1000 07/16/12 0410 07/15/12 0452 07/14/12 0330 07/13/12 0400  NA 133* 137 139 139 137 --  K 3.4* 3.3* -- -- -- --  CL 95* 96 99 98 99 --  CO2 25 24 25 22 26  --  BUN 48* 37* 30* 55* 35* --  CREATININE 5.96* 4.84* 4.22* 6.98* 4.64* --  CALCIUM 8.8 9.0 8.7 8.9 7.9* --  MG 2.4 -- -- 2.6* 2.2 2.0  PHOS 4.9* -- -- 5.5* 3.2 5.6*   Intake/Output      10/27 0701 - 10/28 0700 10/28 0701 - 10/29 0700   P.O. 1010 360   I.V. (mL/kg) 120 (1.5)    Blood 375    NG/GT 120    Total Intake(mL/kg) 1625 (20.3) 360 (4.5)   Urine (mL/kg/hr) 200 (0.1)    Other     Stool 2    Total Output 202    Net +1423 +360         Stool Occurrence 2 x     Foley: none  A:  ESRD, pulm edema  Hypokalemia P:   - Appreciate renal support, HD  - Arinesp as ordered  GASTROINTESTINAL  Lab 07/17/12 0440 07/11/12 1207  AST 15 46*  ALT 10 26  ALKPHOS 89 114  BILITOT 0.3 0.4  PROT 5.8* 6.6  ALBUMIN 2.2* 3.0*   A:  At risk gastric ulcer P:   - PPI  HEMATOLOGIC  Lab 07/17/12 0440 07/16/12 2100 07/16/12 1000 07/16/12 0410 07/15/12 0452 07/11/12 2230 07/11/12 1512  HGB 7.5* 8.0* 7.5* 6.2* 7.5* -- --  HCT 22.9* 25.1* 23.4* 19.5* 24.1* -- --  PLT 149* 181 141* 140* 109* -- --  INR -- -- -- -- -- 1.39 1.40  APTT -- -- -- -- -- 34 55*   A: Anemia, no bleeding noted. Due to critical illness Thrombocytopenia -  48h post admit noted 07/12/12, stable  LE doppler 10/24 >> negative DVT  P:  - Monitor cbc and tx for< 7.0. - HIT panel >> pending. - D/C'd heparin sq and in HD.  INFECTIOUS  Lab 07/17/12 0440 07/16/12 2100 07/16/12 1000 07/16/12 0410 07/15/12 0452  WBC 8.9 11.0* 12.9* 10.8* 9.9  PROCALCITON -- -- -- -- --   Cultures: 10-22 bc>> 10-22 sputum>> ? collected  Results for orders placed during the hospital encounter of 07/11/12  MRSA PCR SCREENING     Status: Normal   Collection Time   07/11/12  4:05 PM      Component Value Range Status Comment   MRSA by PCR NEGATIVE  NEGATIVE Final   CULTURE, BLOOD (ROUTINE X 2)     Status: Normal (Preliminary result)   Collection Time   07/11/12  4:56 PM      Component Value Range Status Comment   Specimen Description BLOOD CENTRAL LINE   Final    Special Requests BOTTLES DRAWN AEROBIC AND ANAEROBIC 10CC   Final    Culture  Setup Time 07/12/2012 03:05   Final    Culture     Final    Value:        BLOOD CULTURE RECEIVED NO GROWTH TO DATE CULTURE WILL BE HELD FOR 5 DAYS BEFORE ISSUING A FINAL NEGATIVE REPORT   Report Status PENDING   Incomplete   CULTURE, BLOOD (ROUTINE X 2)     Status: Normal (Preliminary result)   Collection Time   07/11/12  5:28 PM       Component Value Range Status Comment   Specimen Description BLOOD ARM RIGHT   Final    Special Requests BOTTLES DRAWN AEROBIC ONLY 3CC   Final    Culture  Setup Time 07/12/2012 03:05   Final    Culture     Final    Value:        BLOOD CULTURE RECEIVED NO GROWTH TO DATE CULTURE WILL BE HELD FOR 5 DAYS BEFORE ISSUING A FINAL NEGATIVE REPORT   Report Status PENDING   Incomplete     Antibiotics: Ceftaz 10/22>> 10/26 Vanc  10/22>> 10/24  Anti-infectives     Start     Dose/Rate Route Frequency Ordered Stop   07/15/12 0800   cefTAZidime (FORTAZ) 2 g in dextrose 5 % 50 mL IVPB  Status:  Discontinued        2 g 100 mL/hr over 30 Minutes Intravenous Every Hemodialysis 07/15/12 0732 07/15/12 1118   07/13/12 1500   cefTAZidime (FORTAZ) 2 g in dextrose 5 % 50 mL IVPB  Status:  Discontinued        2 g 100 mL/hr over 30 Minutes Intravenous Every Hemodialysis 07/13/12 1423 07/15/12 0732   07/13/12 1500   cefTAZidime (FORTAZ) 2 g in dextrose 5 % 50 mL IVPB  Status:  Discontinued        2 g 100 mL/hr over 30 Minutes Intravenous  Once 07/13/12 1435 07/13/12 1457   07/13/12 1015   vancomycin (VANCOCIN) IVPB 1000 mg/200 mL premix        1,000 mg 200 mL/hr over 60 Minutes Intravenous  Once 07/13/12 1002 07/13/12 1122   07/12/12 1600   vancomycin (VANCOCIN) IVPB 1000 mg/200 mL premix  Status:  Discontinued        1,000 mg 200 mL/hr over 60 Minutes Intravenous Every Hemodialysis 07/12/12 1204 07/13/12 0955   07/12/12 1600   cefTAZidime (FORTAZ) 2 g in dextrose 5 %  50 mL IVPB  Status:  Discontinued        2 g 100 mL/hr over 30 Minutes Intravenous Every Hemodialysis 07/12/12 1204 07/13/12 1423   07/11/12 2000   vancomycin (VANCOCIN) 1,500 mg in sodium chloride 0.9 % 500 mL IVPB        1,500 mg 250 mL/hr over 120 Minutes Intravenous  Once 07/11/12 1447 07/11/12 2300   07/11/12 2000   cefTAZidime (FORTAZ) 2 g in dextrose 5 % 50 mL IVPB        2 g 100 mL/hr over 30 Minutes Intravenous  Once 07/11/12  1447 07/11/12 2059           A:  Empirical abx for possible HCAP; suspicion higher for pulm edema; cx's NGTD P:   Stop ceftaz 10/26 given CXR clearing, no further evidence clinical PNA, no further need for abx.  ENDOCRINE  Lab 07/17/12 0744 07/17/12 0432 07/16/12 2319 07/16/12 2009 07/16/12 1516  GLUCAP 124* 127* 116* 188* 121*   A:  DM  P:   -SSI  NEUROLOGIC  A:  AMS post arrest, high risk anoxic injury; MS intact am 10/26 with some lethargy; concern for profound weakness, ? Critical care polymyopathy, CK 33 P:   - intermittent sedation and follow MS >> MINIMIZE - check CPK - consider neuro evaluation if no improvement in strength  Will transfer to Dr. Zenia Resides service, PCCM will sign off, please call back if needed.  Rush Farmer, M.D. [facility] Pulmonary/Critical Care Medicine. Pager: [phone]. After hours pager: [phone].

## 2012-07-17 NOTE — Progress Notes (Signed)
Nutrition Follow-up  Intervention:   1. Recommend increase diet to Renal 80/90 for greater protein intake 2. RD will continue to follow    Assessment:   Hypothermia protocol completed, extubated 10/27. Continues HD on TTS. Pt below EDW and pt feels better at this weight per nephrology notes. New EDW is now 80 kg.   Per notes, poor nutritional status? Pt ate 100% of meal this morning. It was a liquid diet, pt c/o pain with swallowing. LIkely from intubation. Pt wants solid foods. Pt states that she does try to eat increased protein foods.   Diet Order:  Renal 69/70  Meds: Scheduled Meds:   . amLODipine  5 mg Oral BID  . cloNIDine  0.3 mg Oral BID  . darbepoetin  100 mcg Intravenous Q Tue-HD  . ferric gluconate (FERRLECIT/NULECIT) IV  62.5 mg Intravenous Q Tue-HD  . insulin aspart  0-9 Units Subcutaneous Q4H  . levothyroxine  100 mcg Oral QAC breakfast  . metoprolol tartrate  50 mg Per Tube BID  . pantoprazole  40 mg Oral Q1200  . paricalcitol  9 mcg Intravenous 3 times weekly  . DISCONTD: antiseptic oral rinse  15 mL Mouth Rinse QID  . DISCONTD: antiseptic oral rinse  15 mL Mouth Rinse BID  . DISCONTD: chlorhexidine  15 mL Mouth Rinse BID  . DISCONTD: levothyroxine  62.5 mcg Intravenous Daily  . DISCONTD: metoprolol tartrate  50 mg Oral BID  . DISCONTD: pantoprazole (PROTONIX) IV  40 mg Intravenous Daily  . DISCONTD: pantoprazole (PROTONIX) IV  40 mg Intravenous Q12H  . DISCONTD: paricalcitol  9 mcg Intravenous 3 times weekly  . DISCONTD: potassium chloride  40 mEq Per Tube Daily   Continuous Infusions:   . sodium chloride 5 mL/hr at 07/13/12 2000  . DISCONTD: sodium chloride 5 mL/hr at 07/13/12 2000  . DISCONTD: dextrose 5 % and 0.45 % NaCl with KCl 20 mEq/L Stopped (07/16/12 1200)   PRN Meds:.sodium chloride, sodium chloride, heparin, heparin, heparin, lidocaine, lidocaine-prilocaine, pentafluoroprop-tetrafluoroeth, DISCONTD: sodium chloride, DISCONTD: sodium chloride,  DISCONTD: alteplase, DISCONTD: feeding supplement (NEPRO CARB STEADY), DISCONTD: feeding supplement (NEPRO CARB STEADY), DISCONTD: fentaNYL, DISCONTD: heparin, DISCONTD: hydrALAZINE, DISCONTD: lidocaine, DISCONTD: lidocaine-prilocaine, DISCONTD: midazolam DISCONTD: pentafluoroprop-tetrafluoroeth  Labs:  CMP     Component Value Date/Time   NA 133* 07/17/2012 0440   K 3.4* 07/17/2012 0440   CL 95* 07/17/2012 0440   CO2 25 07/17/2012 0440   GLUCOSE 132* 07/17/2012 0440   BUN 48* 07/17/2012 0440   CREATININE 5.96* 07/17/2012 0440   CALCIUM 8.8 07/17/2012 0440   PROT 5.8* 07/17/2012 0440   ALBUMIN 2.2* 07/17/2012 0440   AST 15 07/17/2012 0440   ALT 10 07/17/2012 0440   ALKPHOS 89 07/17/2012 0440   BILITOT 0.3 07/17/2012 0440   GFRNONAA 7* 07/17/2012 0440   GFRAA 9* 07/17/2012 0440   No results found for this basename: prealbumin   Sodium  Date/Time Value Range Status  07/17/2012  4:40 AM 133* 135 - 145 mEq/L Final  07/16/2012 10:00 AM 137  135 - 145 mEq/L Final  07/16/2012  4:10 AM 139  135 - 145 mEq/L Final    Potassium  Date/Time Value Range Status  07/17/2012  4:40 AM 3.4* 3.5 - 5.1 mEq/L Final  07/16/2012 10:00 AM 3.3* 3.5 - 5.1 mEq/L Final  07/16/2012  4:10 AM 3.4* 3.5 - 5.1 mEq/L Final     DELTA CHECK NOTED    Phosphorus  Date/Time Value Range Status  07/17/2012  4:40 AM  4.9* 2.3 - 4.6 mg/dL Final  07/15/2012  4:52 AM 5.5* 2.3 - 4.6 mg/dL Final  07/14/2012  3:30 AM 3.2  2.3 - 4.6 mg/dL Final    Magnesium  Date/Time Value Range Status  07/17/2012  4:40 AM 2.4  1.5 - 2.5 mg/dL Final  07/15/2012  4:52 AM 2.6* 1.5 - 2.5 mg/dL Final  07/14/2012  3:30 AM 2.2  1.5 - 2.5 mg/dL Final       Intake/Output Summary (Last 24 hours) at 07/17/12 1154 Last data filed at 07/17/12 0900  Gross per 24 hour  Intake   1445 ml  Output    202 ml  Net   1243 ml    Weight Status:  176 lbs, (80 kg consistent with EDW) down with HD Body mass index is 32.22 kg/(m^2).    Re-estimated needs:  2190-2555 kcal, 87-103 gm protein   Nutrition Dx:  Inadequate oral intake continues at this time with po diet only including liquids. Expect pt will be able to meet nutrition needs with diet.   Goal:  EN goal no longer applicable.  New Goal: PO intake to meet >90% estimated protein needs  Monitor:  PO intake, weight, labs   Orson Slick RD, LDN Pager [phone] After Hours pager [phone]

## 2012-07-18 ENCOUNTER — Inpatient Hospital Stay (HOSPITAL_COMMUNITY): Payer: Medicare Other

## 2012-07-18 LAB — GLUCOSE, CAPILLARY
Glucose-Capillary: 138 mg/dL — ABNORMAL HIGH (ref 70–99)
Glucose-Capillary: 134 mg/dL — ABNORMAL HIGH (ref 70–99)
Glucose-Capillary: 128 mg/dL — ABNORMAL HIGH (ref 70–99)

## 2012-07-18 LAB — RENAL FUNCTION PANEL
Albumin: 2.4 g/dL — ABNORMAL LOW (ref 3.5–5.2)
BUN: 18 mg/dL (ref 6–23)
CO2: 29 mEq/L (ref 19–32)
Chloride: 99 mEq/L (ref 96–112)
Creatinine, Ser: 3.38 mg/dL — ABNORMAL HIGH (ref 0.50–1.10)
Glucose, Bld: 150 mg/dL — ABNORMAL HIGH (ref 70–99)
Potassium: 3.7 mEq/L (ref 3.5–5.1)

## 2012-07-18 LAB — CBC
HCT: 24.3 % — ABNORMAL LOW (ref 36.0–46.0)
Hemoglobin: 7.7 g/dL — ABNORMAL LOW (ref 12.0–15.0)
RBC: 3.34 MIL/uL — ABNORMAL LOW (ref 3.87–5.11)
RDW: 19.5 % — ABNORMAL HIGH (ref 11.5–15.5)
WBC: 5.9 10*3/uL (ref 4.0–10.5)

## 2012-07-18 LAB — CULTURE, BLOOD (ROUTINE X 2): Culture: NO GROWTH

## 2012-07-18 MED ORDER — DARBEPOETIN ALFA-POLYSORBATE 100 MCG/0.5ML IJ SOLN
INTRAMUSCULAR | Status: AC
  Administered 2012-07-18: 100 ug via INTRAVENOUS
  Filled 2012-07-18: qty 0.5

## 2012-07-18 MED ORDER — POLYETHYLENE GLYCOL 3350 17 GM/SCOOP PO POWD
1.0000 | Freq: Once | ORAL | Status: AC
  Administered 2012-07-18: 1 via ORAL
  Filled 2012-07-18: qty 255

## 2012-07-18 MED ORDER — PARICALCITOL 5 MCG/ML IV SOLN
INTRAVENOUS | Status: AC
  Administered 2012-07-18: 9 ug via INTRAVENOUS
  Filled 2012-07-18: qty 2

## 2012-07-18 NOTE — Progress Notes (Signed)
Pt scheduled to have 1515 colonoscopy. Pt arrived at 1250 with half of prep remaining. Pt encouraged to drink but last BM was formed and brown. MD Benson Norway with GI called and colonoscopy cancelled.

## 2012-07-18 NOTE — Progress Notes (Signed)
Subjective: No acute events.  Patient not able to finish the Golytely prep.  Objective: Vital signs in last 24 hours: Temp:  [98 F (36.7 C)-98.7 F (37.1 C)] 98.1 F (36.7 C) (10/29 1248) Pulse Rate:  [71-106] 71  (10/29 1248) Resp:  [15-29] 29  (10/29 1248) BP: (145-209)/(52-93) 161/66 mmHg (10/29 1248) SpO2:  [94 %-98 %] 96 % (10/29 1248) Weight:  [79.1 kg (174 lb 6.1 oz)-80 kg (176 lb 5.9 oz)] 80 kg (176 lb 5.9 oz) (10/29 1248) Last BM Date: 07/18/12  Intake/Output from previous day: 10/28 0701 - 10/29 0700 In: 1080 [P.O.:1080] Out: 2040 [Urine:50] Intake/Output this shift: Total I/O In: 480 [P.O.:480] Out: -   General appearance: alert and no distress GI: soft, non-tender; bowel sounds normal; no masses,  no organomegaly  Lab Results:  Basename 07/18/12 0425 07/17/12 0440 07/16/12 2100  WBC 5.9 8.9 11.0*  HGB 7.7* 7.5* 8.0*  HCT 24.3* 22.9* 25.1*  PLT 161 149* 181   BMET  Basename 07/18/12 0425 07/17/12 0440 07/16/12 1000  NA 137 133* 137  K 3.7 3.4* 3.3*  CL 99 95* 96  CO2 29 25 24   GLUCOSE 150* 132* 137*  BUN 18 48* 37*  CREATININE 3.38* 5.96* 4.84*  CALCIUM 8.5 8.8 9.0   LFT  Basename 07/18/12 0425 07/17/12 0440  PROT -- 5.8*  ALBUMIN 2.4* --  AST -- 15  ALT -- 10  ALKPHOS -- 89  BILITOT -- 0.3  BILIDIR -- --  IBILI -- --   PT/INR No results found for this basename: LABPROT:2,INR:2 in the last 72 hours Hepatitis Panel No results found for this basename: HEPBSAG,HCVAB,HEPAIGM,HEPBIGM in the last 72 hours C-Diff No results found for this basename: CDIFFTOX:3 in the last 72 hours Fecal Lactopherrin No results found for this basename: FECLLACTOFRN in the last 72 hours  Studies/Results: Ct Abdomen Pelvis Wo Contrast  07/17/2012  *RADIOLOGY REPORT*  Clinical Data: Abdominal pain.  The patient was recently removed from the ventilator.  CT ABDOMEN AND PELVIS WITHOUT CONTRAST  Technique:  Multidetector CT imaging of the abdomen and pelvis was  performed following the standard protocol without intravenous contrast.  Comparison: None.  Findings: Trace bilateral pleural effusions are present.  There is no pericardial effusion.  Dependent basilar airspace disease is likely due to atelectasis.  There is a small volume of scattered abdominal and pelvic ascites. No focal fluid collection is identified.  The gallbladder, liver, spleen and pancreas appear normal.  The kidneys are atrophic bilaterally.  Small bilateral nonobstructing renal stones are identified and there are vascular calcifications about both kidneys.  The patient is status post hysterectomy.  The adnexa are unremarkable.  The stomach, small bowel and appendix appear normal. There is mild circumferential wall thickening of the rectum.  No lymphadenopathy is identified.  No focal bony abnormality is identified.  Bones demonstrates increased density diffusely compatible with renal osteodystrophy.  IMPRESSION:  1.  Trace bilateral pleural effusions.  Dependent bilateral airspace disease is likely due to atelectasis. 2.  Small amount of scattered abdominal and pelvic ascites. 3.  Mild thickening of the walls of the rectum is nonspecific but could be due to inflammatory process. 4.  Renal atrophy and changes of renal osteodystrophy.   Original Report Authenticated By: Arvid Right. Luther Parody, M.D.     Medications:  Scheduled:   . amLODipine  5 mg Oral BID  . cloNIDine  0.3 mg Oral BID  . darbepoetin  100 mcg Intravenous Q Tue-HD  . ferric  gluconate (FERRLECIT/NULECIT) IV  62.5 mg Intravenous Q Tue-HD  . insulin aspart  0-9 Units Subcutaneous Q4H  . levothyroxine  100 mcg Oral QAC breakfast  . metoprolol tartrate  50 mg Per Tube BID  . pantoprazole  40 mg Oral Q1200  . paricalcitol  9 mcg Intravenous 3 times weekly  . polyethylene glycol powder  1 Container Oral Once  . polyethylene glycol-electrolytes  4,000 mL Oral Once  . DISCONTD: polyethylene glycol-electrolytes  4,000 mL Oral Once    Continuous:   . sodium chloride 5 mL/hr at 07/13/12 2000  . sodium chloride      Assessment/Plan: 1) Hematochezia.   The patient was not able to finish the prep, however, her bowel movements have been negative for any hematochezia.  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  No other abnormalities in the colon identified, specifically diverticula.  Plan: 1) Colonoscopy tomorrow after a reprep.  LOS: 7 days   Danial Sisley D 07/18/2012, 4:45 PM

## 2012-07-18 NOTE — Progress Notes (Signed)
Called MD [facility] with nephrology and made him aware that pt colonoscopy was cancelled. He is changing order to do HD today and get back on schedule. Will continue to monitor patient. MD Benson Norway with GI wrote orders and pt scheduled to have colonoscopy at 0800 in am. Tamala Julian, Sujay Grundman L

## 2012-07-18 NOTE — Progress Notes (Signed)
Patient ID: [name], female   DOB: [birth year], 51 y.o.   MRN: [id number]  [facility] Progress Note    Subjective:   Feels good.   Objective:   BP 171/66  Pulse 80  Temp 98.2 F (36.8 C) (Oral)  Resp 20  Ht 5\' 2"  (1.575 m)  Wt 79.1 kg (174 lb 6.1 oz)  BMI 31.90 kg/m2  SpO2 95%  Physical Exam: Gen:WD WN AAF in NAD CVS:RRR Resp:CTA [id number] Ext:no edema, AVG +T/B  Labs: BMET  Lab 07/18/12 0425 07/17/12 0440 07/16/12 1000 07/16/12 0410 07/15/12 0452 07/14/12 0330 07/13/12 0400 07/11/12 1207  NA 137 133* 137 139 139 137 139 --  K 3.7 3.4* 3.3* 3.4* 4.2 4.2 3.7 --  CL 99 95* 96 99 98 99 98 --  CO2 29 25 24 25 22 26 26  --  GLUCOSE 150* 132* 137* 115* 126* 139* 143* --  BUN 18 48* 37* 30* 55* 35* 29* --  CREATININE 3.38* 5.96* 4.84* 4.22* 6.98* 4.64* 4.33* --  ALBUMIN 2.4* 2.2* -- -- -- -- -- 3.0*  CALCIUM 8.5 8.8 9.0 8.7 8.9 7.9* 7.4* --  PHOS 2.8 4.9* -- -- 5.5* 3.2 5.6* --   CBC  Lab 07/18/12 0425 07/17/12 0440 07/16/12 2100 07/16/12 1000 07/11/12 1207  WBC 5.9 8.9 11.0* 12.9* --  NEUTROABS -- -- -- -- 6.0  HGB 7.7* 7.5* 8.0* 7.5* --  HCT 24.3* 22.9* 25.1* 23.4* --  MCV 72.8* 72.9* 74.0* 73.8* --  PLT 161 149* 181 141* --    @IMGRELPRIORS @ Medications:      . amLODipine  5 mg Oral BID  . cloNIDine  0.3 mg Oral BID  . darbepoetin  100 mcg Intravenous Q Tue-HD  . ferric gluconate (FERRLECIT/NULECIT) IV  62.5 mg Intravenous Q Tue-HD  . insulin aspart  0-9 Units Subcutaneous Q4H  . levothyroxine  100 mcg Oral QAC breakfast  . metoprolol tartrate  50 mg Per Tube BID  . pantoprazole  40 mg Oral Q1200  . paricalcitol  9 mcg Intravenous 3 times weekly  . polyethylene glycol-electrolytes  4,000 mL Oral Once  . DISCONTD: antiseptic oral rinse  15 mL Mouth Rinse BID  . DISCONTD: levothyroxine  62.5 mcg Intravenous Daily  . DISCONTD: pantoprazole (PROTONIX) IV  40 mg Intravenous Q12H  . DISCONTD: paricalcitol  9 mcg Intravenous 3 times weekly   . DISCONTD: polyethylene glycol-electrolytes  4,000 mL Oral Once     Assessment/ Plan:   1. VDRF/pulm edema- pt with significant pulm edema/vol overload and respiratory arrest at HD. Markedly improved.  Cont to challenge EDW 2. ESRD: cont TTS with new EDW ~80kg. No heparin due to ABLA.  Off schedule. Will get back on schedule later this week.  Hold off on HD today since she is to undergo colonoscopy today. 3. Anemia: with ABLA/acute illness/BRBPR. Cont with epo and follow iron. Transfuse prn. GI to perform colonoscopy today 4. SHPTH- stable 5. Nutrition: poor, cont with prot supple 6. Hypertension:stable with UF, cont to challenge EDW. 7. Vascular access- AVG +T/B 8. Thrombocytopenia- improving 9. DM- per primary svc. 10. Dispo- per primary svc. Ambulate as tolerated. Rashan Rounsaville A 07/18/2012, 9:17 AM

## 2012-07-18 NOTE — Progress Notes (Signed)
Subjective:  Patient denies any chest pain or shortness of breath. States that bright red blood per rectum seen by GI scheduled for colonoscopy.  Objective:  Vital Signs in the last 24 hours: Temp:  [97.5 F (36.4 C)-98.9 F (37.2 C)] 98.1 F (36.7 C) (10/29 0429) Pulse Rate:  [75-106] 78  (10/29 0429) Resp:  [15-25] 22  (10/29 0429) BP: (145-209)/(46-93) 171/66 mmHg (10/29 0700) SpO2:  [93 %-98 %] 94 % (10/29 0429) Weight:  [79.1 kg (174 lb 6.1 oz)-81.3 kg (179 lb 3.7 oz)] 79.1 kg (174 lb 6.1 oz) (10/28 2010)  Intake/Output from previous day: 10/28 0701 - 10/29 0700 In: 1080 [P.O.:1080] Out: 2040 [Urine:50] Intake/Output from this shift:    Physical Exam: Neck: no adenopathy, no carotid bruit, no JVD and supple, symmetrical, trachea midline Lungs: clear to auscultation bilaterally Heart: regular rate and rhythm, S1, S2 normal and Soft systolic murmur noted Abdomen: soft, non-tender; bowel sounds normal; no masses,  no organomegaly Extremities: extremities normal, atraumatic, no cyanosis or edema  Lab Results:  Basename 07/18/12 0425 07/17/12 0440  WBC 5.9 8.9  HGB 7.7* 7.5*  PLT 161 149*    Basename 07/18/12 0425 07/17/12 0440  NA 137 133*  K 3.7 3.4*  CL 99 95*  CO2 29 25  GLUCOSE 150* 132*  BUN 18 48*  CREATININE 3.38* 5.96*    Basename 07/15/12 1650 07/15/12 1100  TROPONINI <0.30 <0.30   Hepatic Function Panel  Basename 07/18/12 0425 07/17/12 0440  PROT -- 5.8*  ALBUMIN 2.4* --  AST -- 15  ALT -- 10  ALKPHOS -- 29  BILITOT -- 0.3  BILIDIR -- --  IBILI -- --   No results found for this basename: CHOL in the last 72 hours No results found for this basename: PROTIME in the last 72 hours  Imaging: Imaging results have been reviewed and Ct Abdomen Pelvis Wo Contrast  07/17/2012  *RADIOLOGY REPORT*  Clinical Data: Abdominal pain.  The patient was recently removed from the ventilator.  CT ABDOMEN AND PELVIS WITHOUT CONTRAST  Technique:  Multidetector  CT imaging of the abdomen and pelvis was performed following the standard protocol without intravenous contrast.  Comparison: None.  Findings: Trace bilateral pleural effusions are present.  There is no pericardial effusion.  Dependent basilar airspace disease is likely due to atelectasis.  There is a small volume of scattered abdominal and pelvic ascites. No focal fluid collection is identified.  The gallbladder, liver, spleen and pancreas appear normal.  The kidneys are atrophic bilaterally.  Small bilateral nonobstructing renal stones are identified and there are vascular calcifications about both kidneys.  The patient is status post hysterectomy.  The adnexa are unremarkable.  The stomach, small bowel and appendix appear normal. There is mild circumferential wall thickening of the rectum.  No lymphadenopathy is identified.  No focal bony abnormality is identified.  Bones demonstrates increased density diffusely compatible with renal osteodystrophy.  IMPRESSION:  1.  Trace bilateral pleural effusions.  Dependent bilateral airspace disease is likely due to atelectasis. 2.  Small amount of scattered abdominal and pelvic ascites. 3.  Mild thickening of the walls of the rectum is nonspecific but could be due to inflammatory process. 4.  Renal atrophy and changes of renal osteodystrophy.   Original Report Authenticated By: Arvid Right. Luther Parody, M.D.     Cardiac Studies:  Assessment/Plan:  Status post PEA cardiac arrest  Resolving Acute pulmonary edema rule out ischemia /MI ruled out  Acute respiratory failure secondary to above  rule out aspiration  Hypertension  Diabetes matters  Morbid obesity  End-stage renal disease on hemodialysis  Hypothyroidism  GERD  Diabetic neuropathy  Peripheral vascular disease Status post lower GI bleed Plan Continue present management Schedule for colonoscopy today Follow H&H closely  LOS: 7 days    Bernerd Terhune N 07/18/2012, 7:59 AM

## 2012-07-19 ENCOUNTER — Encounter (HOSPITAL_COMMUNITY): Payer: Self-pay | Admitting: Gastroenterology

## 2012-07-19 ENCOUNTER — Encounter (HOSPITAL_COMMUNITY)
Admission: EM | Disposition: A | Discharge status: Home / Self Care | Payer: Self-pay | Source: Home / Self Care | Attending: Internal Medicine

## 2012-07-19 HISTORY — PX: COLONOSCOPY: SHX5424

## 2012-07-19 LAB — CBC
MCH: 23.4 pg — ABNORMAL LOW (ref 26.0–34.0)
MCHC: 31.9 g/dL (ref 30.0–36.0)
Platelets: 166 10*3/uL (ref 150–400)
RDW: 19.7 % — ABNORMAL HIGH (ref 11.5–15.5)

## 2012-07-19 LAB — GLUCOSE, CAPILLARY
Glucose-Capillary: 272 mg/dL — ABNORMAL HIGH (ref 70–99)
Glucose-Capillary: 119 mg/dL — ABNORMAL HIGH (ref 70–99)
Glucose-Capillary: 119 mg/dL — ABNORMAL HIGH (ref 70–99)
Glucose-Capillary: 165 mg/dL — ABNORMAL HIGH (ref 70–99)

## 2012-07-19 LAB — RENAL FUNCTION PANEL
Albumin: 2.5 g/dL — ABNORMAL LOW (ref 3.5–5.2)
Calcium: 9 mg/dL (ref 8.4–10.5)
GFR calc Af Amer: 24 mL/min — ABNORMAL LOW (ref >90)
Phosphorus: 2.7 mg/dL (ref 2.3–4.6)
Potassium: 3.1 mEq/L — ABNORMAL LOW (ref 3.5–5.1)
Sodium: 139 mEq/L (ref 135–145)

## 2012-07-19 SURGERY — COLONOSCOPY
Anesthesia: Moderate Sedation

## 2012-07-19 MED ORDER — FENTANYL CITRATE 0.05 MG/ML IJ SOLN
INTRAMUSCULAR | Status: AC
  Filled 2012-07-19: qty 2

## 2012-07-19 MED ORDER — OXYCODONE-ACETAMINOPHEN 5-325 MG PO TABS
1.0000 | ORAL_TABLET | Freq: Three times a day (TID) | ORAL | Status: DC | PRN
  Administered 2012-07-19: 1 via ORAL
  Filled 2012-07-19: qty 1

## 2012-07-19 MED ORDER — MIDAZOLAM HCL 5 MG/5ML IJ SOLN
INTRAMUSCULAR | Status: DC | PRN
  Administered 2012-07-19 (×2): 1 mg via INTRAVENOUS
  Administered 2012-07-19 (×3): 2 mg via INTRAVENOUS

## 2012-07-19 MED ORDER — FENTANYL CITRATE 0.05 MG/ML IJ SOLN
INTRAMUSCULAR | Status: DC | PRN
  Administered 2012-07-19 (×4): 25 ug via INTRAVENOUS

## 2012-07-19 MED ORDER — MIDAZOLAM HCL 5 MG/ML IJ SOLN
INTRAMUSCULAR | Status: AC
  Filled 2012-07-19: qty 2

## 2012-07-19 NOTE — Interval H&P Note (Signed)
History and Physical Interval Note:  07/19/2012 7:55 AM  [name]  has presented today for surgery, with the diagnosis of Rectal bleeding/anemia  The various methods of treatment have been discussed with the patient and family. After consideration of risks, benefits and other options for treatment, the patient has consented to  Procedure(s) (LRB) with comments: COLONOSCOPY (N/A) as a surgical intervention .  The patient's history has been reviewed, patient examined, no change in status, stable for surgery.  I have reviewed the patient's chart and labs.  Questions were answered to the patient's satisfaction.     [name]

## 2012-07-19 NOTE — Progress Notes (Signed)
Subjective:  Patient denies any chest pain or shortness of breath. Tolerated colonoscopy well results not available no further GI bleeding Objective:  Vital Signs in the last 24 hours: Temp:  [97.8 F (36.6 C)-99 F (37.2 C)] 98.2 F (36.8 C) (10/30 1223) Pulse Rate:  [71-98] 82  (10/30 1223) Resp:  [12-35] 22  (10/30 1223) BP: (119-207)/(46-88) 160/62 mmHg (10/30 1223) SpO2:  [91 %-100 %] 100 % (10/30 1223) Weight:  [77.6 kg (171 lb 1.2 oz)-80 kg (176 lb 5.9 oz)] 78.6 kg (173 lb 4.5 oz) (10/29 2236)  Intake/Output from previous day: 10/29 0701 - 10/30 0700 In: 1680 [P.O.:1680] Out: 2013  Intake/Output from this shift: Total I/O In: 20 [I.V.:20] Out: -   Physical Exam: Neck: no adenopathy, no carotid bruit, no JVD and supple, symmetrical, trachea midline Lungs: clear to auscultation bilaterally Heart: regular rate and rhythm, S1, S2 normal, no murmur, click, rub or gallop Abdomen: soft, non-tender; bowel sounds normal; no masses,  no organomegaly Extremities: extremities normal, atraumatic, no cyanosis or edema  Lab Results:  Basename 07/19/12 0450 07/18/12 0425  WBC 5.0 5.9  HGB 7.4* 7.7*  PLT 166 161    Basename 07/19/12 0450 07/18/12 0425  NA 139 137  K 3.1* 3.7  CL 100 99  CO2 30 29  GLUCOSE 80 150*  BUN 9 18  CREATININE 2.52* 3.38*   No results found for this basename: TROPONINI:2,CK,MB:2 in the last 72 hours Hepatic Function Panel  Basename 07/19/12 0450 07/17/12 0440  PROT -- 5.8*  ALBUMIN 2.5* --  AST -- 15  ALT -- 10  ALKPHOS -- 89  BILITOT -- 0.3  BILIDIR -- --  IBILI -- --   No results found for this basename: CHOL in the last 72 hours No results found for this basename: PROTIME in the last 72 hours  Imaging: Imaging results have been reviewed and No results found.  Cardiac Studies:  Assessment/Plan:   Status post PEA cardiac arrest  Status post  Acute pulmonary edema rule out ischemia /MI ruled out  Status post acute respiratory  failure secondary to above rule out aspiration  Hypertension  Diabetes matters  Morbid obesity  End-stage renal disease on hemodialysis  Hypothyroidism  GERD  Diabetic neuropathy  Peripheral vascular disease  Status post lower GI bleed Plan Continue present management  Will DC home tomorrow after hemodialysisConsider packed RBC transfusion during hemodialysis if okay from GI point of view   LOS: 8 days    Irina Okelly N 07/19/2012, 12:40 PM

## 2012-07-19 NOTE — Progress Notes (Addendum)
Patient ID: [name], female   DOB: 01/19/1961, 51 y.o.   MRN: [id number]  [facility] Progress Note    Subjective:   Pt feels "groggy" after colonoscopy   Objective:   BP 165/59  Pulse 84  Temp 98 F (36.7 C) (Oral)  Resp 23  Ht 5\' 2"  (1.575 m)  Wt 78.6 kg (173 lb 4.5 oz)  BMI 31.69 kg/m2  SpO2 100%  Physical Exam: Gen:WD WN AAF in NAD CVS:RRR Resp:CTA [id number] Ext:no edema, AVF +T/B  Labs: BMET  Lab 07/19/12 0450 07/18/12 0425 07/17/12 0440 07/16/12 1000 07/16/12 0410 07/15/12 0452 07/14/12 0330 07/13/12 0400  NA 139 137 133* 137 139 139 137 --  K 3.1* 3.7 3.4* 3.3* 3.4* 4.2 4.2 --  CL 100 99 95* 96 99 98 99 --  CO2 30 29 25 24 25 22 26  --  GLUCOSE 80 150* 132* 137* 115* 126* 139* --  BUN 9 18 48* 37* 30* 55* 35* --  CREATININE 2.52* 3.38* 5.96* 4.84* 4.22* 6.98* 4.64* --  ALBUMIN 2.5* 2.4* 2.2* -- -- -- -- --  CALCIUM 9.0 8.5 8.8 9.0 8.7 8.9 7.9* --  PHOS 2.7 2.8 4.9* -- -- 5.5* 3.2 5.6*   CBC  Lab 07/19/12 0450 07/18/12 0425 07/17/12 0440 07/16/12 2100  WBC 5.0 5.9 8.9 11.0*  NEUTROABS -- -- -- --  HGB 7.4* 7.7* 7.5* 8.0*  HCT 23.2* 24.3* 22.9* 25.1*  MCV 73.4* 72.8* 72.9* 74.0*  PLT 166 161 149* 181    @IMGRELPRIORS @ Medications:      . amLODipine  5 mg Oral BID  . cloNIDine  0.3 mg Oral BID  . darbepoetin  100 mcg Intravenous Q Tue-HD  . ferric gluconate (FERRLECIT/NULECIT) IV  62.5 mg Intravenous Q Tue-HD  . insulin aspart  0-9 Units Subcutaneous Q4H  . levothyroxine  100 mcg Oral QAC breakfast  . metoprolol tartrate  50 mg Per Tube BID  . pantoprazole  40 mg Oral Q1200  . paricalcitol  9 mcg Intravenous 3 times weekly  . polyethylene glycol powder  1 Container Oral Once     Assessment/ Plan:   1. VDRF/pulm edema- pt with significant pulm edema/vol overload and respiratory arrest at HD. Markedly improved. Cont to challenge EDW 2. ESRD: cont TTS with new EDW ~80kg. No heparin due to ABLA. Back on schedule.  3. Anemia:  with ABLA/acute illness/BRBPR. Cont with epo and follow iron. Transfuse prn. Await GI report on results of colonoscopy performed today.  Will type and screen today and transfuse 2uPRBC with HD if hgb <7.4 tomorrow. 4. SHPTH- stable 5. Nutrition: poor, cont with prot supple 6. Hypertension:stable with UF, cont to challenge EDW. 7. Vascular access- AVG +T/B 8. Thrombocytopenia- improving 9. DM- per primary svc. 10. Dispo- per primary svc. Ambulate as tolerated. Calyn Rubi A 07/19/2012, 11:06 AM

## 2012-07-19 NOTE — Progress Notes (Signed)
2mg  Midazolem given at [phone].  Not charted at that time.  Chart closed and unable to post last dose given.  Pt. Given a total of 8mcg Fentanyl and 19mg  Midazolem.

## 2012-07-19 NOTE — H&P (View-Only) (Signed)
Subjective: No acute events.  Patient not able to finish the Golytely prep.  Objective: Vital signs in last 24 hours: Temp:  [98 F (36.7 C)-98.7 F (37.1 C)] 98.1 F (36.7 C) (10/29 1248) Pulse Rate:  [71-106] 71  (10/29 1248) Resp:  [15-29] 29  (10/29 1248) BP: (145-209)/(52-93) 161/66 mmHg (10/29 1248) SpO2:  [94 %-98 %] 96 % (10/29 1248) Weight:  [79.1 kg (174 lb 6.1 oz)-80 kg (176 lb 5.9 oz)] 80 kg (176 lb 5.9 oz) (10/29 1248) Last BM Date: 07/18/12  Intake/Output from previous day: 10/28 0701 - 10/29 0700 In: 1080 [P.O.:1080] Out: 2040 [Urine:50] Intake/Output this shift: Total I/O In: 480 [P.O.:480] Out: -   General appearance: alert and no distress GI: soft, non-tender; bowel sounds normal; no masses,  no organomegaly  Lab Results:  Basename 07/18/12 0425 07/17/12 0440 07/16/12 2100  WBC 5.9 8.9 11.0*  HGB 7.7* 7.5* 8.0*  HCT 24.3* 22.9* 25.1*  PLT 161 149* 181   BMET  Basename 07/18/12 0425 07/17/12 0440 07/16/12 1000  NA 137 133* 137  K 3.7 3.4* 3.3*  CL 99 95* 96  CO2 29 25 24   GLUCOSE 150* 132* 137*  BUN 18 48* 37*  CREATININE 3.38* 5.96* 4.84*  CALCIUM 8.5 8.8 9.0   LFT  Basename 07/18/12 0425 07/17/12 0440  PROT -- 5.8*  ALBUMIN 2.4* --  AST -- 15  ALT -- 10  ALKPHOS -- 89  BILITOT -- 0.3  BILIDIR -- --  IBILI -- --   PT/INR No results found for this basename: LABPROT:2,INR:2 in the last 72 hours Hepatitis Panel No results found for this basename: HEPBSAG,HCVAB,HEPAIGM,HEPBIGM in the last 72 hours C-Diff No results found for this basename: CDIFFTOX:3 in the last 72 hours Fecal Lactopherrin No results found for this basename: FECLLACTOFRN in the last 72 hours  Studies/Results: Ct Abdomen Pelvis Wo Contrast  07/17/2012  *RADIOLOGY REPORT*  Clinical Data: Abdominal pain.  The patient was recently removed from the ventilator.  CT ABDOMEN AND PELVIS WITHOUT CONTRAST  Technique:  Multidetector CT imaging of the abdomen and pelvis was  performed following the standard protocol without intravenous contrast.  Comparison: None.  Findings: Trace bilateral pleural effusions are present.  There is no pericardial effusion.  Dependent basilar airspace disease is likely due to atelectasis.  There is a small volume of scattered abdominal and pelvic ascites. No focal fluid collection is identified.  The gallbladder, liver, spleen and pancreas appear normal.  The kidneys are atrophic bilaterally.  Small bilateral nonobstructing renal stones are identified and there are vascular calcifications about both kidneys.  The patient is status post hysterectomy.  The adnexa are unremarkable.  The stomach, small bowel and appendix appear normal. There is mild circumferential wall thickening of the rectum.  No lymphadenopathy is identified.  No focal bony abnormality is identified.  Bones demonstrates increased density diffusely compatible with renal osteodystrophy.  IMPRESSION:  1.  Trace bilateral pleural effusions.  Dependent bilateral airspace disease is likely due to atelectasis. 2.  Small amount of scattered abdominal and pelvic ascites. 3.  Mild thickening of the walls of the rectum is nonspecific but could be due to inflammatory process. 4.  Renal atrophy and changes of renal osteodystrophy.   Original Report Authenticated By: Arvid Right. Luther Parody, M.D.     Medications:  Scheduled:   . amLODipine  5 mg Oral BID  . cloNIDine  0.3 mg Oral BID  . darbepoetin  100 mcg Intravenous Q Tue-HD  . ferric  gluconate (FERRLECIT/NULECIT) IV  62.5 mg Intravenous Q Tue-HD  . insulin aspart  0-9 Units Subcutaneous Q4H  . levothyroxine  100 mcg Oral QAC breakfast  . metoprolol tartrate  50 mg Per Tube BID  . pantoprazole  40 mg Oral Q1200  . paricalcitol  9 mcg Intravenous 3 times weekly  . polyethylene glycol powder  1 Container Oral Once  . polyethylene glycol-electrolytes  4,000 mL Oral Once  . DISCONTD: polyethylene glycol-electrolytes  4,000 mL Oral Once    Continuous:   . sodium chloride 5 mL/hr at 07/13/12 2000  . sodium chloride      Assessment/Plan: 1) Hematochezia.   The patient was not able to finish the prep, however, her bowel movements have been negative for any hematochezia.  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  No other abnormalities in the colon identified, specifically diverticula.  Plan: 1) Colonoscopy tomorrow after a reprep.  LOS: 7 days   Odyn Turko D 07/18/2012, 4:45 PM

## 2012-07-19 NOTE — Op Note (Signed)
[facility] [place], [id number]   OPERATIVE PROCEDURE REPORT  PATIENT: [name], [name]  MR#: [id number] BIRTHDATE: [birth year]  GENDER: Female ENDOSCOPIST: Carol Ada, MD ASSISTANT:   Truddie Coco, RN, CGRN Remigio Eisenmenger, technician PROCEDURE DATE: 07/19/2012 PROCEDURE:   Colonoscopy with biopsy ASA CLASS:   Class III INDICATIONS:hematochezia. MEDICATIONS: Versed 10 mg IV and Fentanyl 100 mcg IV  DESCRIPTION OF PROCEDURE:   After the risks benefits and alternatives of the procedure were thoroughly explained, informed consent was obtained.  A digital rectal exam revealed no abnormalities of the rectum.    The Pentax Colonoscope [id number] endoscope was introduced through the anus  and advanced to the cecum, which was identified by both the appendix and ileocecal valve , No adverse events experienced.    The quality of the prep was good. .  The instrument was then slowly withdrawn as the colon was fully examined.     FINDINGS: From the anal verge through the descending colon there was evidence of isolated ulcerations.  There was no a signficant amount of surrounding inflammation with the ulcers.  Multiple cold biopsies were obtained from the rectum.  No evidence of any masses, polyps, or vascular abnormalities.   Retroflexed views revealed internal/external hemorrhoids.     The scope was then withdrawn from the patient and the procedure terminated.  COMPLICATIONS: There were no complications.  IMPRESSION: 1) Left sided colitis/ulcerations.  RECOMMENDATIONS: 1)Await biopsy results   _______________________________ eSignedCarol Ada, MD 07/19/2012 12:42 PM

## 2012-07-19 NOTE — Progress Notes (Signed)
Physical Therapy Treatment Patient Details Name: [name] MRN: [id number] DOB: 08-Oct-1960 Today's Date: 07/19/2012 Time: [id number] PT Time Calculation (min): 23 min  PT Assessment / Plan / Recommendation Comments on Treatment Session  Pt moves fairly well but cont's to be slightly unsteady.  Although trialed ambulation without RW initially, cont'd to strongly recommend use of RW at all times when d/c'd home.   Pt's son & daughter present entire session.  They state pt will have 24 hr (A)/(S).      Follow Up Recommendations  Home health PT;Supervision/Assistance - 24 hour     Does the patient have the potential to tolerate intense rehabilitation     Barriers to Discharge        Equipment Recommendations  Rolling walker with 5" wheels    Recommendations for Other Services    Frequency Min 3X/week   Plan Discharge plan remains appropriate    Precautions / Restrictions Precautions Precautions: None Restrictions Weight Bearing Restrictions: No   Pertinent Vitals/Pain No pain reported.     Mobility  Bed Mobility Bed Mobility: Not assessed Details for Bed Mobility Assistance: pt sitting on EOB upon arrival Transfers Transfers: Sit to Stand;Stand to Sit Sit to Stand: 5: Supervision;With upper extremity assist;From bed Stand to Sit: 5: Supervision;With upper extremity assist;With armrests;To chair/3-in-1 Details for Transfer Assistance: Supervision for safety.   Pt standing up before getting IV & equipment ready.   Ambulation/Gait Ambulation/Gait Assistance: 4: Min guard;4: Min Wellsite geologist (Feet): 200 Feet Assistive device: 1 person hand held assist;Rolling walker Ambulation/Gait Assistance Details: Initiated ambulation with HHA with pt requiring (A) for balance/stability, then gave pt RW to use for remainder of distance.  Increased steadiness noted with RW.  Cues to look ahead, stay inside RW, & safety with turns.   Gait Pattern: Step-through  pattern;Decreased stride length Gait velocity: slow gait speed      PT Goals Acute Rehab PT Goals Time For Goal Achievement: 07/24/12 Potential to Achieve Goals: Good Pt will go Supine/Side to Sit: Independently;with HOB 0 degrees Pt will go Sit to Supine/Side: Independently;with HOB 0 degrees Pt will go Sit to Stand: with supervision;with upper extremity assist PT Goal: Sit to Stand - Progress: Met Pt will go Stand to Sit: with supervision;with upper extremity assist PT Goal: Stand to Sit - Progress: Met Pt will Ambulate: >150 feet;with supervision;with least restrictive assistive device PT Goal: Ambulate - Progress: Progressing toward goal Pt will Go Up / Down Stairs: 1-2 stairs;with supervision;with least restrictive assistive device;with rail(s)  Visit Information  Last PT Received On: 07/19/12 Assistance Needed: +1    Subjective Data  Patient Stated Goal: To go home soon   Cognition  Overall Cognitive Status: Appears within functional limits for tasks assessed/performed Arousal/Alertness: Awake/alert Orientation Level: Oriented X4 / Intact Behavior During Session: [facility] for tasks performed    Balance  Static Standing Balance Static Standing - Balance Support: No upper extremity supported Static Standing - Level of Assistance: 6: Modified independent (Device/Increase time) Static Standing - Comment/# of Minutes: Pt able to stand x ~3 minutes without UE support while getting equipment (IV & tele box) set up & ready for ambulation.    End of Session PT - End of Session Equipment Utilized During Treatment: Gait belt Activity Tolerance: Patient tolerated treatment well Patient left: in chair;with call bell/phone within reach;with family/visitor present Nurse Communication: Mobility status     Sarajane Marek, [place] [phone] 07/19/2012

## 2012-07-20 ENCOUNTER — Encounter (HOSPITAL_COMMUNITY): Payer: Self-pay | Admitting: Gastroenterology

## 2012-07-20 LAB — RENAL FUNCTION PANEL
Albumin: 2.5 g/dL — ABNORMAL LOW (ref 3.5–5.2)
Calcium: 9.3 mg/dL (ref 8.4–10.5)
Creatinine, Ser: 4.79 mg/dL — ABNORMAL HIGH (ref 0.50–1.10)
GFR calc Af Amer: 11 mL/min — ABNORMAL LOW (ref >90)
GFR calc non Af Amer: 10 mL/min — ABNORMAL LOW (ref >90)
Phosphorus: 4.7 mg/dL — ABNORMAL HIGH (ref 2.3–4.6)
Sodium: 137 mEq/L (ref 135–145)

## 2012-07-20 LAB — CBC
Hemoglobin: 7.9 g/dL — ABNORMAL LOW (ref 12.0–15.0)
MCH: 23.2 pg — ABNORMAL LOW (ref 26.0–34.0)
MCV: 74.2 fL — ABNORMAL LOW (ref 78.0–100.0)
RBC: 3.41 MIL/uL — ABNORMAL LOW (ref 3.87–5.11)
WBC: 4.9 10*3/uL (ref 4.0–10.5)

## 2012-07-20 MED ORDER — METOPROLOL TARTRATE 50 MG PO TABS: 50.0000 mg | ORAL_TABLET | Freq: Two times a day (BID) | ORAL | Status: DC

## 2012-07-20 MED ORDER — INFLUENZA VIRUS VACC SPLIT PF IM SUSP
0.5000 mL | INTRAMUSCULAR | Status: AC
  Administered 2012-07-20: 0.5 mL via INTRAMUSCULAR
  Filled 2012-07-20: qty 0.5

## 2012-07-20 MED ORDER — PRAVASTATIN SODIUM 40 MG PO TABS: 40.0000 mg | ORAL_TABLET | Freq: Every day | ORAL | Status: DC

## 2012-07-20 MED ORDER — INFLUENZA VIRUS VACC SPLIT PF IM SUSP: 0.5000 mL | INTRAMUSCULAR | Status: DC

## 2012-07-20 MED ORDER — PARICALCITOL 5 MCG/ML IV SOLN
INTRAVENOUS | Status: AC
  Administered 2012-07-20: 9 ug via INTRAVENOUS
  Filled 2012-07-20: qty 2

## 2012-07-20 MED ORDER — AMLODIPINE BESYLATE 5 MG PO TABS: 5.0000 mg | ORAL_TABLET | Freq: Two times a day (BID) | ORAL | Status: DC

## 2012-07-20 NOTE — Procedures (Signed)
Patient was seen on dialysis and the procedure was supervised. BFR 400 Via AVF BP is 155/71.  Patient appears to be tolerating treatment well

## 2012-07-20 NOTE — ED Provider Notes (Signed)
History     CSN: [id number]  Arrival date & time 07/11/12  1127   First MD Initiated Contact with Patient 07/11/12 1204      Chief Complaint  Patient presents with  . Cardiac Arrest    (Consider location/radiation/quality/duration/timing/severity/associated sxs/prior treatment) HPI Patient was received from dialysis- Patient with agonal respiration - CPR started at dialysis- Intubated with 8.0 ET tube - 2 Epi's, 1 gram of calcium, 50 mil bicarb, 5 mg Versed enroute - Patient with ST on monitor on arrival to ED- 1000 ml of fluid during transport  Past Medical History  Diagnosis Date  . Renal disorder   . Diabetes mellitus without complication   . Hypertension   . Thyroid disorder   . GERD (gastroesophageal reflux disease)   . Neuropathy   . Hypothyroidism   . Peripheral vascular disease   . Anemia   . Hemodialysis patient   . Hyperlipidemia   . SVT (supraventricular tachycardia)   . Sickle cell trait   . Gastroparesis due to DM     Past Surgical History  Procedure Date  . Thyroidectomy   . Knee arthroplasty   . Cataract extraction, bilateral   . Colonoscopy 07/19/2012    Procedure: COLONOSCOPY;  Surgeon: Beryle Beams, MD;  Location: [facility];  Service: Endoscopy;  Laterality: N/A;    History reviewed. No pertinent family history.  History  Substance Use Topics  . Smoking status: Never Smoker   . Smokeless tobacco: Not on file  . Alcohol Use: No    OB History    Grav Para Term Preterm Abortions TAB SAB Ect Mult Living                  Review of Systems  Unable to perform ROS: Intubated    Allergies  Review of patient's allergies indicates no known allergies.  Home Medications   Current Outpatient Rx  Name Route Sig Dispense Refill  . ASPIRIN 81 MG PO CHEW Oral Chew 81 mg by mouth daily.    Marland Kitchen CLONIDINE HCL 0.2 MG PO TABS Oral Take 0.2 mg by mouth 2 (two) times daily.    Marland Kitchen ESOMEPRAZOLE MAGNESIUM 40 MG PO CPDR Oral Take 40 mg by mouth daily  before breakfast.    . INSULIN LISPRO PROT & LISPRO (75-25) 100 UNIT/ML  SUSP Subcutaneous Inject 20-30 Units into the skin 2 (two) times daily with a meal. 30 units in the morning; 20 units in the evening    . LEVOTHYROXINE SODIUM 125 MCG PO TABS Oral Take 125 mcg by mouth every evening.     Marland Kitchen RENA-VITE PO TABS Oral Take 1 tablet by mouth daily.    . OXYCODONE-ACETAMINOPHEN 5-325 MG PO TABS Oral Take 1 tablet by mouth 3 (three) times daily.    Marland Kitchen SEVELAMER CARBONATE 800 MG PO TABS Oral Take 800-2,400 mg by mouth See admin instructions. Take 3 tablets (2400mg ) three times daily with meals and 1 tablet (800mg ) with snacks.    . AMLODIPINE BESYLATE 5 MG PO TABS Oral Take 1 tablet (5 mg total) by mouth 2 (two) times daily. 60 tablet 3  . METOPROLOL TARTRATE 50 MG PO TABS Oral Take 1 tablet (50 mg total) by mouth 2 (two) times daily. 60 tablet 3  . PRAVASTATIN SODIUM 40 MG PO TABS Oral Take 1 tablet (40 mg total) by mouth daily. 30 tablet 3    BP 153/54  Pulse 80  Temp 97.7 F (36.5 C) (Oral)  Resp 16  Ht 5\' 2"  (1.575 m)  Wt 170 lb 3.1 oz (77.2 kg)  BMI 31.13 kg/m2  SpO2 96%  Physical Exam  Nursing note and vitals reviewed. Constitutional: She appears well-developed and well-nourished. She appears distressed. She is intubated.  HENT:  Head: Normocephalic and atraumatic.  Eyes: No scleral icterus.  Neck: Normal range of motion.  Cardiovascular: Normal rate and intact distal pulses.   Pulmonary/Chest: She is intubated. She is in respiratory distress. She has no decreased breath sounds.  Abdominal: Normal appearance. She exhibits no distension.  Musculoskeletal: Normal range of motion.  Neurological: She is unresponsive. No cranial nerve deficit. GCS eye subscore is 2. GCS verbal subscore is 1. GCS motor subscore is 4.  Skin: Skin is warm and dry. No rash noted.    ED Course  Procedures (including critical care time)  EKG Rate;=68 SINUS RHYTHM ~ normal P axis, V-rate 50-  99 BORDERLINE PROLONGED QT INTERVAL ~ QTc >457mS Borderline ECG  CRITICAL CARE Performed by: Leonard Schwartz L   Total critical care time:30 min  Critical care time was exclusive of separately billable procedures and treating other patients.  Critical care was necessary to treat or prevent imminent or life-threatening deterioration.  Critical care was time spent personally by me on the following activities: development of treatment plan with patient and/or surrogate as well as nursing, discussions with consultants, evaluation of patient's response to treatment, examination of patient, obtaining history from patient or surrogate, ordering and performing treatments and interventions, ordering and review of laboratory studies, ordering and review of radiographic studies, pulse oximetry and re-evaluation of patient's condition.   Labs Reviewed  POCT I-STAT, CHEM 8 - Abnormal; Notable for the following:    BUN 44 (*)     Creatinine, Ser 6.40 (*)     Glucose, Bld 199 (*)     Calcium, Ion 1.27 (*)     Hemoglobin 11.9 (*)     HCT 35.0 (*)     All other components within normal limits  CBC WITH DIFFERENTIAL - Abnormal; Notable for the following:    WBC 12.0 (*)     Hemoglobin 9.3 (*)  REPEATED TO VERIFY   HCT 30.9 (*)     MCV 76.9 (*)     MCH 23.1 (*)     RDW 21.0 (*)     Lymphs Abs 5.1 (*)     All other components within normal limits  COMPREHENSIVE METABOLIC PANEL - Abnormal; Notable for the following:    Sodium 134 (*)     Chloride 93 (*)     Glucose, Bld 215 (*)     BUN 42 (*)     Creatinine, Ser 7.95 (*)     Albumin 3.0 (*)     AST 46 (*)     GFR calc non Af Amer 5 (*)     GFR calc Af Amer 6 (*)     All other components within normal limits  LACTIC ACID, PLASMA - Abnormal; Notable for the following:    Lactic Acid, Venous 8.1 (*)     All other components within normal limits  URINALYSIS, ROUTINE W REFLEX MICROSCOPIC - Abnormal; Notable for the following:    APPearance  CLOUDY (*)     Glucose, UA 100 (*)     Hgb urine dipstick LARGE (*)     Ketones, ur 15 (*)     Protein, ur 100 (*)     Leukocytes, UA SMALL (*)     All other components  within normal limits  BASIC METABOLIC PANEL - Abnormal; Notable for the following:    Potassium 5.8 (*)     Glucose, Bld 152 (*)     BUN 49 (*)     Creatinine, Ser 8.22 (*)     GFR calc non Af Amer 5 (*)     GFR calc Af Amer 6 (*)     All other components within normal limits  BASIC METABOLIC PANEL - Abnormal; Notable for the following:    Glucose, Bld 127 (*)     Creatinine, Ser 3.28 (*)     GFR calc non Af Amer 15 (*)     GFR calc Af Amer 18 (*)     All other components within normal limits  BASIC METABOLIC PANEL - Abnormal; Notable for the following:    Potassium 3.2 (*)     Glucose, Bld 108 (*)     Creatinine, Ser 1.77 (*)  DELTA CHECK NOTED   GFR calc non Af Amer 32 (*)     GFR calc Af Amer 37 (*)     All other components within normal limits  GLUCOSE, CAPILLARY - Abnormal; Notable for the following:    Glucose-Capillary 156 (*)     All other components within normal limits  POCT I-STAT 3, BLOOD GAS (G3+) - Abnormal; Notable for the following:    pH, Arterial 7.452 (*)     pCO2 arterial 34.1 (*)     pO2, Arterial 211.0 (*)     Bicarbonate 24.3 (*)     All other components within normal limits  PROTIME-INR - Abnormal; Notable for the following:    Prothrombin Time 16.8 (*)     All other components within normal limits  APTT - Abnormal; Notable for the following:    aPTT 55 (*)     All other components within normal limits  GLUCOSE, CAPILLARY - Abnormal; Notable for the following:    Glucose-Capillary 143 (*)     All other components within normal limits  HEPATITIS B SURFACE ANTIBODY - Abnormal; Notable for the following:    Hep B S Ab POSITIVE (*)     All other components within normal limits  POCT I-STAT, CHEM 8 - Abnormal; Notable for the following:    BUN 41 (*)     Creatinine, Ser 7.00 (*)      Glucose, Bld 149 (*)     Calcium, Ion 1.11 (*)     Hemoglobin 8.2 (*)     HCT 24.0 (*)     All other components within normal limits  POCT I-STAT 3, BLOOD GAS (G3+) - Abnormal; Notable for the following:    pH, Arterial 7.297 (*)     pCO2 arterial 52.4 (*)     pO2, Arterial 51.0 (*)     Bicarbonate 25.6 (*)     All other components within normal limits  POCT I-STAT, CHEM 8 - Abnormal; Notable for the following:    Creatinine, Ser 3.00 (*)     Glucose, Bld 142 (*)     All other components within normal limits  GLUCOSE, CAPILLARY - Abnormal; Notable for the following:    Glucose-Capillary 120 (*)     All other components within normal limits  CK TOTAL AND CKMB - Abnormal; Notable for the following:    CK, MB 4.1 (*)     Relative Index 3.9 (*)     All other components within normal limits  CK TOTAL AND CKMB - Abnormal; Notable  for the following:    CK, MB 4.5 (*)     Relative Index 4.4 (*)     All other components within normal limits  CK TOTAL AND CKMB - Abnormal; Notable for the following:    CK, MB 4.8 (*)     All other components within normal limits  TROPONIN I - Abnormal; Notable for the following:    Troponin I 0.57 (*)     All other components within normal limits  TROPONIN I - Abnormal; Notable for the following:    Troponin I 0.77 (*)     All other components within normal limits  TROPONIN I - Abnormal; Notable for the following:    Troponin I 0.62 (*)     All other components within normal limits  GLUCOSE, CAPILLARY - Abnormal; Notable for the following:    Glucose-Capillary 112 (*)     All other components within normal limits  POCT I-STAT 3, BLOOD GAS (G3+) - Abnormal; Notable for the following:    pH, Arterial 7.608 (*)     pCO2 arterial 24.2 (*)     pO2, Arterial 559.0 (*)     Bicarbonate 25.4 (*)     Acid-Base Excess 3.0 (*)     All other components within normal limits  PROTIME-INR - Abnormal; Notable for the following:    Prothrombin Time 16.7 (*)      All other components within normal limits  POCT I-STAT 3, BLOOD GAS (G3+) - Abnormal; Notable for the following:    pH, Arterial 7.517 (*)     pCO2 arterial 28.7 (*)     pO2, Arterial 120.0 (*)     Bicarbonate 24.9 (*)     All other components within normal limits  POCT I-STAT, CHEM 8 - Abnormal; Notable for the following:    Potassium 3.0 (*)     BUN 27 (*)     Creatinine, Ser 4.50 (*)     Glucose, Bld 151 (*)     Calcium, Ion 1.01 (*)     Hemoglobin 10.2 (*)     HCT 30.0 (*)     All other components within normal limits  CK TOTAL AND CKMB - Abnormal; Notable for the following:    CK, MB 4.8 (*)     All other components within normal limits  CK TOTAL AND CKMB - Abnormal; Notable for the following:    CK, MB 5.7 (*)     All other components within normal limits  TROPONIN I - Abnormal; Notable for the following:    Troponin I 0.33 (*)     All other components within normal limits  GLUCOSE, CAPILLARY - Abnormal; Notable for the following:    Glucose-Capillary 125 (*)     All other components within normal limits  URINE MICROSCOPIC-ADD ON - Abnormal; Notable for the following:    Bacteria, UA MANY (*)     All other components within normal limits  POCT I-STAT, CHEM 8 - Abnormal; Notable for the following:    BUN 32 (*)     Creatinine, Ser 4.80 (*)     Glucose, Bld 120 (*)     Calcium, Ion 1.02 (*)     Hemoglobin 10.2 (*)     HCT 30.0 (*)     All other components within normal limits  POCT I-STAT 3, BLOOD GAS (G3+) - Abnormal; Notable for the following:    Bicarbonate 28.0 (*)     All other components within normal limits  GLUCOSE, CAPILLARY - Abnormal; Notable for the following:    Glucose-Capillary 161 (*)     All other components within normal limits  GLUCOSE, CAPILLARY - Abnormal; Notable for the following:    Glucose-Capillary 130 (*)     All other components within normal limits  GLUCOSE, CAPILLARY - Abnormal; Notable for the following:    Glucose-Capillary 120  (*)     All other components within normal limits  BASIC METABOLIC PANEL - Abnormal; Notable for the following:    Glucose, Bld 129 (*)     BUN 36 (*)     Creatinine, Ser 5.28 (*)     Calcium 7.9 (*)     GFR calc non Af Amer 9 (*)     GFR calc Af Amer 10 (*)     All other components within normal limits  CBC - Abnormal; Notable for the following:    RBC 3.85 (*)     Hemoglobin 9.0 (*)     HCT 28.4 (*)     MCV 73.8 (*)     MCH 23.4 (*)     RDW 20.6 (*)     Platelets 88 (*)     All other components within normal limits  BASIC METABOLIC PANEL - Abnormal; Notable for the following:    Glucose, Bld 126 (*)     BUN 38 (*)     Creatinine, Ser 5.52 (*)     Calcium 7.8 (*)     GFR calc non Af Amer 8 (*)     GFR calc Af Amer 9 (*)     All other components within normal limits  BASIC METABOLIC PANEL - Abnormal; Notable for the following:    Potassium 2.9 (*)  DELTA CHECK NOTED   Glucose, Bld 157 (*)     Creatinine, Ser 2.51 (*)  DELTA CHECK NOTED   Calcium 8.0 (*)     GFR calc non Af Amer 21 (*)     GFR calc Af Amer 24 (*)     All other components within normal limits  BASIC METABOLIC PANEL - Abnormal; Notable for the following:    Potassium 3.3 (*)     Glucose, Bld 161 (*)     Creatinine, Ser 3.38 (*)     Calcium 7.5 (*)     GFR calc non Af Amer 15 (*)     GFR calc Af Amer 17 (*)     All other components within normal limits  BASIC METABOLIC PANEL - Abnormal; Notable for the following:    Glucose, Bld 150 (*)     BUN 27 (*)     Creatinine, Ser 4.04 (*)     Calcium 7.5 (*)     GFR calc non Af Amer 12 (*)     GFR calc Af Amer 14 (*)     All other components within normal limits  GLUCOSE, CAPILLARY - Abnormal; Notable for the following:    Glucose-Capillary 127 (*)     All other components within normal limits  CK TOTAL AND CKMB - Abnormal; Notable for the following:    CK, MB 4.3 (*)     All other components within normal limits  BASIC METABOLIC PANEL - Abnormal; Notable  for the following:    Glucose, Bld 143 (*)     BUN 29 (*)     Creatinine, Ser 4.33 (*)     Calcium 7.4 (*)     GFR calc non Af Amer 11 (*)  GFR calc Af Amer 13 (*)     All other components within normal limits  PHOSPHORUS - Abnormal; Notable for the following:    Phosphorus 5.6 (*)     All other components within normal limits  GLUCOSE, CAPILLARY - Abnormal; Notable for the following:    Glucose-Capillary 150 (*)     All other components within normal limits  GLUCOSE, CAPILLARY - Abnormal; Notable for the following:    Glucose-Capillary 150 (*)     All other components within normal limits  GLUCOSE, CAPILLARY - Abnormal; Notable for the following:    Glucose-Capillary 139 (*)     All other components within normal limits  GLUCOSE, CAPILLARY - Abnormal; Notable for the following:    Glucose-Capillary 149 (*)     All other components within normal limits  GLUCOSE, CAPILLARY - Abnormal; Notable for the following:    Glucose-Capillary 130 (*)     All other components within normal limits  CBC - Abnormal; Notable for the following:    RBC 3.70 (*)     Hemoglobin 8.7 (*)     HCT 27.8 (*)     MCV 75.1 (*)     MCH 23.5 (*)     RDW 20.6 (*)     Platelets 88 (*)  CONSISTENT WITH PREVIOUS RESULT   All other components within normal limits  GLUCOSE, CAPILLARY - Abnormal; Notable for the following:    Glucose-Capillary 137 (*)     All other components within normal limits  GLUCOSE, CAPILLARY - Abnormal; Notable for the following:    Glucose-Capillary 172 (*)     All other components within normal limits  BASIC METABOLIC PANEL - Abnormal; Notable for the following:    Glucose, Bld 139 (*)     BUN 35 (*)     Creatinine, Ser 4.64 (*)     Calcium 7.9 (*)     GFR calc non Af Amer 10 (*)     GFR calc Af Amer 12 (*)     All other components within normal limits  CBC - Abnormal; Notable for the following:    RBC 3.15 (*)     Hemoglobin 7.3 (*)     HCT 23.4 (*)     MCV 74.3 (*)      MCH 23.2 (*)     RDW 20.2 (*)     Platelets 99 (*)  CONSISTENT WITH PREVIOUS RESULT   All other components within normal limits  GLUCOSE, CAPILLARY - Abnormal; Notable for the following:    Glucose-Capillary 160 (*)     All other components within normal limits  GLUCOSE, CAPILLARY - Abnormal; Notable for the following:    Glucose-Capillary 140 (*)     All other components within normal limits  GLUCOSE, CAPILLARY - Abnormal; Notable for the following:    Glucose-Capillary 145 (*)     All other components within normal limits  GLUCOSE, CAPILLARY - Abnormal; Notable for the following:    Glucose-Capillary 109 (*)     All other components within normal limits  GLUCOSE, CAPILLARY - Abnormal; Notable for the following:    Glucose-Capillary 135 (*)     All other components within normal limits  GLUCOSE, CAPILLARY - Abnormal; Notable for the following:    Glucose-Capillary 101 (*)     All other components within normal limits  BASIC METABOLIC PANEL - Abnormal; Notable for the following:    Glucose, Bld 126 (*)     BUN 55 (*)  DELTA  CHECK NOTED   Creatinine, Ser 6.98 (*)  DELTA CHECK NOTED   GFR calc non Af Amer 6 (*)     GFR calc Af Amer 7 (*)     All other components within normal limits  MAGNESIUM - Abnormal; Notable for the following:    Magnesium 2.6 (*)     All other components within normal limits  PHOSPHORUS - Abnormal; Notable for the following:    Phosphorus 5.5 (*)     All other components within normal limits  CBC - Abnormal; Notable for the following:    RBC 3.34 (*)     Hemoglobin 7.5 (*)     HCT 24.1 (*)     MCV 72.2 (*)     MCH 22.5 (*)     RDW 20.0 (*)     Platelets 109 (*)  CONSISTENT WITH PREVIOUS RESULT   All other components within normal limits  GLUCOSE, CAPILLARY - Abnormal; Notable for the following:    Glucose-Capillary 104 (*)     All other components within normal limits  GLUCOSE, CAPILLARY - Abnormal; Notable for the following:    Glucose-Capillary  103 (*)     All other components within normal limits  GLUCOSE, CAPILLARY - Abnormal; Notable for the following:    Glucose-Capillary 124 (*)     All other components within normal limits  GLUCOSE, CAPILLARY - Abnormal; Notable for the following:    Glucose-Capillary 113 (*)     All other components within normal limits  GLUCOSE, CAPILLARY - Abnormal; Notable for the following:    Glucose-Capillary 124 (*)     All other components within normal limits  BASIC METABOLIC PANEL - Abnormal; Notable for the following:    Potassium 3.4 (*)  DELTA CHECK NOTED   Glucose, Bld 115 (*)     BUN 30 (*)  DELTA CHECK NOTED   Creatinine, Ser 4.22 (*)  DELTA CHECK NOTED   GFR calc non Af Amer 11 (*)     GFR calc Af Amer 13 (*)     All other components within normal limits  CBC - Abnormal; Notable for the following:    WBC 10.8 (*)     RBC 2.70 (*)     Hemoglobin 6.2 (*)     HCT 19.5 (*)     MCV 72.2 (*)     MCH 23.0 (*)     RDW 19.8 (*)     Platelets 140 (*)     All other components within normal limits  GLUCOSE, CAPILLARY - Abnormal; Notable for the following:    Glucose-Capillary 113 (*)     All other components within normal limits  GLUCOSE, CAPILLARY - Abnormal; Notable for the following:    Glucose-Capillary 113 (*)     All other components within normal limits  GLUCOSE, CAPILLARY - Abnormal; Notable for the following:    Glucose-Capillary 117 (*)     All other components within normal limits  BASIC METABOLIC PANEL - Abnormal; Notable for the following:    Potassium 3.3 (*)     Glucose, Bld 137 (*)     BUN 37 (*)     Creatinine, Ser 4.84 (*)     GFR calc non Af Amer 10 (*)     GFR calc Af Amer 11 (*)     All other components within normal limits  GLUCOSE, CAPILLARY - Abnormal; Notable for the following:    Glucose-Capillary 120 (*)     All other components  within normal limits  CBC - Abnormal; Notable for the following:    WBC 12.9 (*)     RBC 3.17 (*)     Hemoglobin 7.5 (*)      HCT 23.4 (*)     MCV 73.8 (*)     MCH 23.7 (*)     RDW 19.8 (*)     Platelets 141 (*)     All other components within normal limits  GLUCOSE, CAPILLARY - Abnormal; Notable for the following:    Glucose-Capillary 150 (*)     All other components within normal limits  CBC - Abnormal; Notable for the following:    RBC 3.14 (*)     Hemoglobin 7.5 (*)     HCT 22.9 (*)     MCV 72.9 (*)     MCH 23.9 (*)     RDW 19.4 (*)     Platelets 149 (*)     All other components within normal limits  COMPREHENSIVE METABOLIC PANEL - Abnormal; Notable for the following:    Sodium 133 (*)     Potassium 3.4 (*)     Chloride 95 (*)     Glucose, Bld 132 (*)     BUN 48 (*)     Creatinine, Ser 5.96 (*)     Total Protein 5.8 (*)     Albumin 2.2 (*)     GFR calc non Af Amer 7 (*)     GFR calc Af Amer 9 (*)     All other components within normal limits  PHOSPHORUS - Abnormal; Notable for the following:    Phosphorus 4.9 (*)     All other components within normal limits  GLUCOSE, CAPILLARY - Abnormal; Notable for the following:    Glucose-Capillary 121 (*)     All other components within normal limits  CBC - Abnormal; Notable for the following:    WBC 11.0 (*)     RBC 3.39 (*)     Hemoglobin 8.0 (*)     HCT 25.1 (*)     MCV 74.0 (*)     MCH 23.6 (*)     RDW 19.6 (*)     All other components within normal limits  GLUCOSE, CAPILLARY - Abnormal; Notable for the following:    Glucose-Capillary 188 (*)     All other components within normal limits  GLUCOSE, CAPILLARY - Abnormal; Notable for the following:    Glucose-Capillary 116 (*)     All other components within normal limits  GLUCOSE, CAPILLARY - Abnormal; Notable for the following:    Glucose-Capillary 127 (*)     All other components within normal limits  GLUCOSE, CAPILLARY - Abnormal; Notable for the following:    Glucose-Capillary 124 (*)     All other components within normal limits  GLUCOSE, CAPILLARY - Abnormal; Notable for the  following:    Glucose-Capillary 159 (*)     All other components within normal limits  RENAL FUNCTION PANEL - Abnormal; Notable for the following:    Glucose, Bld 150 (*)     Creatinine, Ser 3.38 (*)  DELTA CHECK NOTED   Albumin 2.4 (*)     GFR calc non Af Amer 15 (*)     GFR calc Af Amer 17 (*)     All other components within normal limits  GLUCOSE, CAPILLARY - Abnormal; Notable for the following:    Glucose-Capillary 127 (*)     All other components within normal limits  CBC -  Abnormal; Notable for the following:    RBC 3.34 (*)     Hemoglobin 7.7 (*)     HCT 24.3 (*)     MCV 72.8 (*)     MCH 23.1 (*)     RDW 19.5 (*)     All other components within normal limits  GLUCOSE, CAPILLARY - Abnormal; Notable for the following:    Glucose-Capillary 138 (*)     All other components within normal limits  GLUCOSE, CAPILLARY - Abnormal; Notable for the following:    Glucose-Capillary 134 (*)     All other components within normal limits  GLUCOSE, CAPILLARY - Abnormal; Notable for the following:    Glucose-Capillary 128 (*)     All other components within normal limits  CBC - Abnormal; Notable for the following:    RBC 3.16 (*)     Hemoglobin 7.4 (*)     HCT 23.2 (*)     MCV 73.4 (*)     MCH 23.4 (*)     RDW 19.7 (*)     All other components within normal limits  RENAL FUNCTION PANEL - Abnormal; Notable for the following:    Potassium 3.1 (*)     Creatinine, Ser 2.52 (*)     Albumin 2.5 (*)     GFR calc non Af Amer 21 (*)     GFR calc Af Amer 24 (*)     All other components within normal limits  GLUCOSE, CAPILLARY - Abnormal; Notable for the following:    Glucose-Capillary 272 (*)     All other components within normal limits  GLUCOSE, CAPILLARY - Abnormal; Notable for the following:    Glucose-Capillary 119 (*)     All other components within normal limits  GLUCOSE, CAPILLARY - Abnormal; Notable for the following:    Glucose-Capillary 119 (*)     All other components  within normal limits  GLUCOSE, CAPILLARY - Abnormal; Notable for the following:    Glucose-Capillary 119 (*)     All other components within normal limits  GLUCOSE, CAPILLARY - Abnormal; Notable for the following:    Glucose-Capillary 165 (*)     All other components within normal limits  RENAL FUNCTION PANEL - Abnormal; Notable for the following:    Glucose, Bld 101 (*)     Creatinine, Ser 4.79 (*)  DELTA CHECK NOTED   Phosphorus 4.7 (*)     Albumin 2.5 (*)     GFR calc non Af Amer 10 (*)     GFR calc Af Amer 11 (*)     All other components within normal limits  GLUCOSE, CAPILLARY - Abnormal; Notable for the following:    Glucose-Capillary 147 (*)     All other components within normal limits  GLUCOSE, CAPILLARY - Abnormal; Notable for the following:    Glucose-Capillary 124 (*)     All other components within normal limits  CBC - Abnormal; Notable for the following:    RBC 3.41 (*)     Hemoglobin 7.9 (*)     HCT 25.3 (*)     MCV 74.2 (*)     MCH 23.2 (*)     RDW 20.0 (*)     All other components within normal limits  GLUCOSE, CAPILLARY - Abnormal; Notable for the following:    Glucose-Capillary 101 (*)     All other components within normal limits  POCT I-STAT TROPONIN I  HCG, SERUM, QUALITATIVE  CULTURE, BLOOD (ROUTINE X 2)  CULTURE, BLOOD (ROUTINE X 2)  MRSA PCR SCREENING  HEPATITIS B SURFACE ANTIGEN  HEPATITIS B CORE ANTIBODY, TOTAL  GLUCOSE, CAPILLARY  APTT  TROPONIN I  GLUCOSE, CAPILLARY  TROPONIN I  MAGNESIUM  HEPARIN INDUCED THROMBOCYTOPENIA PNL  PHOSPHORUS  MAGNESIUM  TSH  GLUCOSE, CAPILLARY  TROPONIN I  TROPONIN I  CK  TYPE AND SCREEN  PREPARE RBC (CROSSMATCH)  ABO/RH  MAGNESIUM  MAGNESIUM  GLUCOSE, CAPILLARY  GLUCOSE, CAPILLARY  SURGICAL PATHOLOGY      1. Cardiac arrest   2. Acute respiratory failure with hypoxia   3. DM (diabetes mellitus)   4. ESRD (end stage renal disease)   5. HTN (hypertension)   6. Obesity       MDM  PCCM  consulted.  Patient to be admitted.  Condition is critical.       Dot Lanes, MD 07/20/12 [phone]

## 2012-07-20 NOTE — Progress Notes (Signed)
Patient ID: [name], female   DOB: December 18, 1960, 51 y.o.   MRN: [id number]  [facility] Progress Note    Subjective:   Feels good, no complaints   Objective:   BP 155/71  Pulse 82  Temp 98.3 F (36.8 C) (Oral)  Resp 18  Ht 5\' 2"  (1.575 m)  Wt 79.5 kg (175 lb 4.3 oz)  BMI 32.06 kg/m2  SpO2 94%  Physical Exam: Gen:WD WN AAF in NAD CVS:RRR Resp:CTA [id number] Ext:no edema  Labs: BMET  Lab 07/20/12 0435 07/19/12 0450 07/18/12 0425 07/17/12 0440 07/16/12 1000 07/16/12 0410 07/15/12 0452 07/14/12 0330  NA 137 139 137 133* 137 139 139 --  K 3.7 3.1* 3.7 3.4* 3.3* 3.4* 4.2 --  CL 99 100 99 95* 96 99 98 --  CO2 28 30 29 25 24 25 22  --  GLUCOSE 101* 80 150* 132* 137* 115* 126* --  BUN 18 9 18  48* 37* 30* 55* --  CREATININE 4.79* 2.52* 3.38* 5.96* 4.84* 4.22* 6.98* --  ALBUMIN 2.5* 2.5* 2.4* 2.2* -- -- -- --  CALCIUM 9.3 9.0 8.5 8.8 9.0 8.7 8.9 --  PHOS 4.7* 2.7 2.8 4.9* -- -- 5.5* 3.2   CBC  Lab 07/20/12 0659 07/19/12 0450 07/18/12 0425 07/17/12 0440  WBC 4.9 5.0 5.9 8.9  NEUTROABS -- -- -- --  HGB 7.9* 7.4* 7.7* 7.5*  HCT 25.3* 23.2* 24.3* 22.9*  MCV 74.2* 73.4* 72.8* 72.9*  PLT 205 166 161 149*    @IMGRELPRIORS @ Medications:      . amLODipine  5 mg Oral BID  . cloNIDine  0.3 mg Oral BID  . darbepoetin  100 mcg Intravenous Q Tue-HD  . ferric gluconate (FERRLECIT/NULECIT) IV  62.5 mg Intravenous Q Tue-HD  . insulin aspart  0-9 Units Subcutaneous Q4H  . levothyroxine  100 mcg Oral QAC breakfast  . metoprolol tartrate  50 mg Per Tube BID  . pantoprazole  40 mg Oral Q1200  . paricalcitol  9 mcg Intravenous 3 times weekly     Assessment/ Plan:   1. VDRF/pulm edema- pt with significant pulm edema/vol overload and respiratory arrest at HD. Markedly improved. Cont to challenge EDW 2. ESRD: cont TTS with new EDW ~78kg. No heparin due to ABLA. Back on schedule.  3. Anemia: with ABLA/acute illness/BRBPR. Cont with epo and follow iron. Transfuse  prn. Await GI report on results of colonoscopy performed today. Will type and screen today and transfuse 2uPRBC with HD if hgb <7.4 tomorrow. 4. GIB- colonoscopy with colitis.  bx pending. Plan per GI 5. SHPTH- stable 6. Nutrition: poor, cont with prot supple 7. Hypertension:stable with UF, cont to challenge EDW. 8. Vascular access- AVG +T/B 9. Thrombocytopenia- improving 10. DM- per primary svc. 11. Dispo- per primary svc. Ambulate as tolerated.  Sulma Ruffino A 07/20/2012, 10:00 AM

## 2012-07-20 NOTE — Progress Notes (Signed)
Subjective: No acute events.  She is well at this time and ready to go home.  Objective: Vital signs in last 24 hours: Temp:  [97.7 F (36.5 C)-99 F (37.2 C)] 97.7 F (36.5 C) (10/31 1217) Pulse Rate:  [66-101] 80  (10/31 1217) Resp:  [13-29] 16  (10/31 1217) BP: (118-177)/(48-77) 153/54 mmHg (10/31 1217) SpO2:  [94 %-98 %] 96 % (10/31 1058) Weight:  [77.2 kg (170 lb 3.1 oz)-79.5 kg (175 lb 4.3 oz)] 77.2 kg (170 lb 3.1 oz) (10/31 1058) Last BM Date: 07/19/12  Intake/Output from previous day: 10/30 0701 - 10/31 0700 In: 400 [I.V.:400] Out: -  Intake/Output this shift: Total I/O In: -  Out: -2200   General appearance: alert and no distress GI: soft, non-tender; bowel sounds normal; no masses,  no organomegaly  Lab Results:  [facility] 07/20/12 0659 07/19/12 0450 07/18/12 0425  WBC 4.9 5.0 5.9  HGB 7.9* 7.4* 7.7*  HCT 25.3* 23.2* 24.3*  PLT 205 166 161   BMET  Basename 07/20/12 0435 07/19/12 0450 07/18/12 0425  NA 137 139 137  K 3.7 3.1* 3.7  CL 99 100 99  CO2 28 30 29   GLUCOSE 101* 80 150*  BUN 18 9 18   CREATININE 4.79* 2.52* 3.38*  CALCIUM 9.3 9.0 8.5   LFT  Basename 07/20/12 0435  PROT --  ALBUMIN 2.5*  AST --  ALT --  ALKPHOS --  BILITOT --  BILIDIR --  IBILI --   PT/INR No results found for this basename: LABPROT:2,INR:2 in the last 72 hours Hepatitis Panel No results found for this basename: HEPBSAG,HCVAB,HEPAIGM,HEPBIGM in the last 72 hours C-Diff No results found for this basename: CDIFFTOX:3 in the last 72 hours Fecal Lactopherrin No results found for this basename: FECLLACTOFRN in the last 72 hours  Studies/Results: No results found.  Medications:  Scheduled:   . amLODipine  5 mg Oral BID  . cloNIDine  0.3 mg Oral BID  . darbepoetin  100 mcg Intravenous Q Tue-HD  . ferric gluconate (FERRLECIT/NULECIT) IV  62.5 mg Intravenous Q Tue-HD  . influenza  inactive virus vaccine  0.5 mL Intramuscular Tomorrow-1000  . insulin aspart  0-9  Units Subcutaneous Q4H  . levothyroxine  100 mcg Oral QAC breakfast  . metoprolol tartrate  50 mg Per Tube BID  . pantoprazole  40 mg Oral Q1200  . paricalcitol  9 mcg Intravenous 3 times weekly  . DISCONTD: influenza  inactive virus vaccine  0.5 mL Intramuscular Tomorrow-1000   Continuous:   . sodium chloride 250 mL (07/19/12 0737)    Assessment/Plan: 1) Colonic ulcers.   She is stable and able to be discharged home today.  I do not know the etiology of the colonic ulcers as the biopsies are pending.  Plan: 1) Follow up in the office in two weeks.  LOS: 9 days   [name] D 07/20/2012, 1:45 PM

## 2012-07-20 NOTE — Discharge Summary (Signed)
  Discharge summary dictated on 07/20/2012 dictation number is [phone]

## 2012-07-21 ENCOUNTER — Encounter (HOSPITAL_COMMUNITY): Payer: Self-pay

## 2012-07-21 ENCOUNTER — Encounter: Payer: Self-pay | Admitting: *Deleted

## 2012-07-21 NOTE — Discharge Summary (Signed)
[name], [name]               ACCOUNT NO.:  [id number]  MEDICAL RECORD NO.:  [id number]  LOCATION:  2507                         FACILITY:  [facility]  PHYSICIAN:  Allegra Lai. Terrence Dupont, M.D. DATE OF BIRTH:  27-Apr-1961  DATE OF ADMISSION:  07/11/2012 DATE OF DISCHARGE:  07/20/2012                              DISCHARGE SUMMARY   ADMITTING DIAGNOSES:  Status post pulseless electrical activity cardiac arrest, rule out cardiogenic pulmonary edema, hypertensive emergency, end-stage renal disease on hemodialysis, hypertension, anemia of chronic disease, acute respiratory failure, altered mental status post cardiac arrest, rule out anoxic encephalopathy.  Diabetes mellitus, hypothyroidism, gastroesophageal reflux disease, diabetic neuropathy.  FINAL DIAGNOSES: 1. Status post pulseless electrical activity cardiac arrest, status     post acute pulmonary edema, status post acute respiratory failure     secondary to above. 2. Hypertension. 3. Diabetes mellitus. 4. Morbid obesity. 5. End-stage renal disease, on hemodialysis. 6. Hypothyroidism. 7. Gastroesophageal reflux disease. 8. Anemia of chronic disease. 9. Status post lower GI bleed status post colonoscopy. 10.Diabetic neuropathy. 11.Peripheral vascular disease.  DISCHARGE HOME MEDICATIONS: 1. Amlodipine 5 mg 1 tablet twice daily. 2. Pravachol 40 mg 1 tablet daily. 3. Metoprolol 50 mg twice daily. 4. Enteric-coated aspirin 81 mg 1 tablet daily. 5. Clonidine 0.2 mg twice daily. 6. Nexium 40 mg 1 capsule daily. 7. Humalog 75/25, 30 units in the morning, 20 units in the evening as     before. 8. Synthroid 125 mcg daily. 9. Multivitamin 1 tablet daily. 10.Percocet 1 tablet 3 times daily as before. 11.Renvela 800 mg 3 tablets 3 times daily as before. 12.The patient has been advised to stop simvastatin and nifedipine.  DIET:  Low-salt low-cholesterol 1800 calories ADA/renal diet.  Follow up with me in 1 week.  Continue hemodialysis 3  times per week on Tuesdays, Thursday, and Saturday as before.  CONDITION AT DISCHARGE:  Stable.  Follow up with Renal Service as scheduled.  Follow up with GI in 2-4 weeks.  Condition at discharge is stable.  BRIEF HISTORY AND HOSPITAL COURSE:  Ms. [name] is a 51 year old female with past medical history significant for hypertension, diabetes mellitus, end-stage renal disease on hemodialysis, GERD, hypothyroidism, morbid obesity, diabetic neuropathy, had PEA cardiac arrest during hemodialysis requiring CPR for 5-10 minutes prior to arrival of EMS. The patient arrived at [facility] dyspneic and while on hemodialysis for 5-10 minutes suddenly collapsed.  The patient was intubated on the field and was brought to the ER.  EKG done in the ER showed normal sinus rhythm with no significant acute ischemic changes. The patient presently intubated, sedated on paralytics and was getting emergency hemodialysis.  The patient was noted to be in acute pulmonary edema.  Review of the chart shows no chest pain prior to collapse.  PAST MEDICAL HISTORY:  As above.  PAST SURGICAL HISTORY:  None except for AV grafts.  PHYSICAL EXAMINATION:  VITAL SIGNS:  Her blood pressure was 98/45, pulse was 63.  She was on hypothermia protocol. HEENT:  Conjunctivae normal.  Pupils were equal, round, reactive to light. NECK:  Supple.  Positive JVD. CARDIOVASCULAR:  S1, S2 was soft.  There was S3 gallop.  No pericardial rub. LUNGS:  Decreased breath sounds at bases with bibasilar rales. ABDOMEN:  Soft.  Bowel sounds were present. EXTREMITIES:  There is no clubbing, cyanosis.  There was 1+ edema.  LABORATORY DATA:  Her initial ABGs, pH was 7.29, pCO2 52.4, pO2 51. Sodium was 134, potassium 4.6, BUN 42, creatinine 7.95, glucose was 215. Her hemoglobin was 9.3, hematocrit 30.9, white count of 12.0.  CK was 104, MB 4.1, troponin 0.57.  Repeat CK was 103, MB 4.5, troponin 0.77. Repeat CK 86, MB 4.5, troponin 0.62  which is trending down.  Next set troponin 0.33.  Next set less than 0.30.  Next set less than 0.30.  CK 68, MB 5.7.  Last CK was 40, MB 4.3.  Her labs today, sodium was 137, potassium 3.7, BUN 18, creatinine 4.79, hemoglobin 7.9, hematocrit 25.3, white count of 4.9, platelet count 205,000.  Glucose was 101.  EKG done on October 29 showed normal sinus rhythm with poor R-wave progression in V1 to V3.  There were no acute ischemic changes.  Chest x-ray showed congestive heart failure with bilateral pleural effusions.  Repeat x-ray showed on 26th, improving perihilar airspace disease and seen in the previous study.  Repeat chest x-ray showed low lung volumes with bibasilar atelectasis.  A 2D echo done toward the left ventricular cavity size was normal.  There was mild LVH.  Systolic function was normal with no wall motion abnormalities.  There was trivial tricuspid regurgitation.  There was no pericardial effusion.  BRIEF HOSPITAL COURSE:  The patient was admitted by Critical Care Medicine in CCU.  The patient was placed on hypothermia protocol.  He was started on broad-spectrum antibiotics and had multiple rounds of hemodialysis.  The patient was subsequently extubated and was transferred to step-down unit.  The patient is off antibiotics.  OT/PT consultation was obtained.  The patient has been ambulating in room without any problems.  The patient is tolerating hemodialysis well and has remained afebrile during the hospital stay.  The patient states she had cardiac catheterization in [place] recently which showed mild coronary artery disease and also was told to have peripheral vascular disease and was treated medically.  The patient will be discharged home on above medications and will be followed up in my office in 1 week and we will continue to follow with Renal Service as before.     Allegra Lai. Terrence Dupont, M.D.     MNH/MEDQ  D:  07/20/2012  T:  08/06/2012  Job:  [id number]

## 2012-08-20 DEATH — deceased

## 2012-10-17 ENCOUNTER — Other Ambulatory Visit (HOSPITAL_COMMUNITY): Payer: Self-pay | Admitting: Nephrology

## 2012-10-17 DIAGNOSIS — N186 End stage renal disease: Secondary | ICD-10-CM

## 2012-10-20 ENCOUNTER — Other Ambulatory Visit (HOSPITAL_COMMUNITY): Payer: Self-pay

## 2012-12-29 ENCOUNTER — Other Ambulatory Visit (HOSPITAL_COMMUNITY): Payer: Self-pay | Admitting: Nephrology

## 2012-12-29 DIAGNOSIS — N186 End stage renal disease: Secondary | ICD-10-CM

## 2013-01-03 ENCOUNTER — Other Ambulatory Visit (HOSPITAL_COMMUNITY): Payer: Self-pay | Admitting: Nephrology

## 2013-01-03 ENCOUNTER — Ambulatory Visit (HOSPITAL_COMMUNITY)
Admission: RE | Admit: 2013-01-03 | Discharge: 2013-01-03 | Disposition: A | Discharge status: Home / Self Care | Payer: Medicare Other | Source: Ambulatory Visit | Attending: Nephrology | Admitting: Nephrology

## 2013-01-03 ENCOUNTER — Encounter (HOSPITAL_COMMUNITY): Payer: Self-pay

## 2013-01-03 DIAGNOSIS — D573 Sickle-cell trait: Secondary | ICD-10-CM | POA: Insufficient documentation

## 2013-01-03 DIAGNOSIS — D649 Anemia, unspecified: Secondary | ICD-10-CM | POA: Insufficient documentation

## 2013-01-03 DIAGNOSIS — E039 Hypothyroidism, unspecified: Secondary | ICD-10-CM | POA: Insufficient documentation

## 2013-01-03 DIAGNOSIS — I120 Hypertensive chronic kidney disease with stage 5 chronic kidney disease or end stage renal disease: Secondary | ICD-10-CM | POA: Insufficient documentation

## 2013-01-03 DIAGNOSIS — I871 Compression of vein: Secondary | ICD-10-CM | POA: Insufficient documentation

## 2013-01-03 DIAGNOSIS — I498 Other specified cardiac arrhythmias: Secondary | ICD-10-CM | POA: Insufficient documentation

## 2013-01-03 DIAGNOSIS — T82898A Other specified complication of vascular prosthetic devices, implants and grafts, initial encounter: Secondary | ICD-10-CM | POA: Insufficient documentation

## 2013-01-03 DIAGNOSIS — E785 Hyperlipidemia, unspecified: Secondary | ICD-10-CM | POA: Insufficient documentation

## 2013-01-03 DIAGNOSIS — K3184 Gastroparesis: Secondary | ICD-10-CM | POA: Insufficient documentation

## 2013-01-03 DIAGNOSIS — N186 End stage renal disease: Secondary | ICD-10-CM | POA: Insufficient documentation

## 2013-01-03 DIAGNOSIS — E1149 Type 2 diabetes mellitus with other diabetic neurological complication: Secondary | ICD-10-CM | POA: Insufficient documentation

## 2013-01-03 DIAGNOSIS — Y832 Surgical operation with anastomosis, bypass or graft as the cause of abnormal reaction of the patient, or of later complication, without mention of misadventure at the time of the procedure: Secondary | ICD-10-CM | POA: Insufficient documentation

## 2013-01-03 LAB — GLUCOSE, CAPILLARY: Glucose-Capillary: 102 mg/dL — ABNORMAL HIGH (ref 70–99)

## 2013-01-03 MED ORDER — MIDAZOLAM HCL 2 MG/2ML IJ SOLN
INTRAMUSCULAR | Status: DC | PRN
  Administered 2013-01-03: 1 mg via INTRAVENOUS

## 2013-01-03 MED ORDER — FENTANYL CITRATE 0.05 MG/ML IJ SOLN
INTRAMUSCULAR | Status: DC | PRN
  Administered 2013-01-03: 50 ug via INTRAVENOUS

## 2013-01-03 MED ORDER — MIDAZOLAM HCL 2 MG/2ML IJ SOLN
INTRAMUSCULAR | Status: AC
  Filled 2013-01-03: qty 2

## 2013-01-03 MED ORDER — IOHEXOL 300 MG/ML  SOLN
100.0000 mL | Freq: Once | INTRAMUSCULAR | Status: AC | PRN
  Administered 2013-01-03: 50 mL via INTRAVENOUS

## 2013-01-03 MED ORDER — FENTANYL CITRATE 0.05 MG/ML IJ SOLN
INTRAMUSCULAR | Status: AC
  Filled 2013-01-03: qty 2

## 2013-01-03 NOTE — Procedures (Signed)
Shuntogram demonstrated intrastent stenosis.  Stenosis treated with 7 mm and 8 mm balloons.  No immediate complication.  See radiology report.

## 2013-01-03 NOTE — H&P (Signed)
[name] is an 52 y.o. female.   Chief Complaint: left arm dialysis fistula slow flow fistulogram shows stenosis Pt now scheduled for angioplasty/stent placement HPI: DM; ESRD; SVC; HTN; thyroid dz; HLD; SST; gastroparesis  Past Medical History  Diagnosis Date  . Diabetes mellitus   . Supraventricular tachycardia   . Renal disorder     Hemodialysis T, TH, Sat  . Thyroid disease   . Renal disorder   . Diabetes mellitus without complication   . Hypertension   . Thyroid disorder   . GERD (gastroesophageal reflux disease)   . Neuropathy   . Hypothyroidism   . Peripheral vascular disease   . Anemia   . Hemodialysis patient   . Hyperlipidemia   . SVT (supraventricular tachycardia)   . Sickle cell trait   . Gastroparesis due to DM     Past Surgical History  Procedure Laterality Date  . Abdominal hysterectomy    . Knee surgery      right  . Thyroid surgery    . Thyroidectomy    . Knee arthroplasty    . Cataract extraction, bilateral    . Colonoscopy  07/19/2012    Procedure: COLONOSCOPY;  Surgeon: Beryle Beams, MD;  Location: [facility];  Service: Endoscopy;  Laterality: N/A;    No family history on file. Social History:  reports that she has never smoked. She does not have any smokeless tobacco history on file. She reports that she does not drink alcohol or use illicit drugs.  Allergies: No Known Allergies   (Not in a hospital admission)  No results found for this or any previous visit (from the past 48 hour(s)). No results found.  Review of Systems  Constitutional: Negative for fever.  Respiratory: Negative for shortness of breath.   Cardiovascular: Negative for chest pain.  Gastrointestinal: Negative for nausea and vomiting.  Neurological: Negative for weakness.    There were no vitals taken for this visit. Physical Exam  Constitutional: She is oriented to person, place, and time. She appears well-developed and well-nourished.  Cardiovascular:  Normal rate, regular rhythm and normal heart sounds.   No murmur heard. Respiratory: Effort normal and breath sounds normal. She has no wheezes.  GI: Soft. Bowel sounds are normal. There is no tenderness.  Musculoskeletal: Normal range of motion.  Neurological: She is alert and oriented to person, place, and time.  Psychiatric: She has a normal mood and affect. Her behavior is normal. Judgment and thought content normal.     Assessment/Plan Left arm dialysis fistula stenosis Scheduled for pta/stent Pt aware of procedure benefits and risks and agreeable to proceed Consent signed and in chart  Greco Gastelum A 01/03/2013, 8:44 AM

## 2013-01-18 HISTORY — PX: OTHER SURGICAL HISTORY: SHX169

## 2013-02-07 ENCOUNTER — Telehealth: Payer: Self-pay

## 2013-02-07 NOTE — Telephone Encounter (Signed)
Dr. Marval Regal requested a fistulogram per Dr. Trula Slade due to decreased access flow and recurrent stenosis of Left upper Arm AVG; advised will schedule pt. For 02/14/13 with Dr. Trula Slade @ 10:00 AM.

## 2013-02-08 ENCOUNTER — Encounter (HOSPITAL_COMMUNITY): Payer: Self-pay | Admitting: Pharmacy Technician

## 2013-02-08 ENCOUNTER — Other Ambulatory Visit: Payer: Self-pay

## 2013-02-14 ENCOUNTER — Ambulatory Visit (HOSPITAL_COMMUNITY)
Admission: RE | Admit: 2013-02-14 | Discharge: 2013-02-14 | Disposition: A | Discharge status: Home / Self Care | Payer: Medicare Other | Source: Ambulatory Visit | Attending: Surgery | Admitting: Surgery

## 2013-02-14 ENCOUNTER — Encounter (HOSPITAL_COMMUNITY)
Admission: RE | Disposition: A | Discharge status: Home / Self Care | Payer: Self-pay | Source: Ambulatory Visit | Attending: Surgery

## 2013-02-14 DIAGNOSIS — E079 Disorder of thyroid, unspecified: Secondary | ICD-10-CM | POA: Insufficient documentation

## 2013-02-14 DIAGNOSIS — I498 Other specified cardiac arrhythmias: Secondary | ICD-10-CM | POA: Insufficient documentation

## 2013-02-14 DIAGNOSIS — D649 Anemia, unspecified: Secondary | ICD-10-CM | POA: Insufficient documentation

## 2013-02-14 DIAGNOSIS — Y832 Surgical operation with anastomosis, bypass or graft as the cause of abnormal reaction of the patient, or of later complication, without mention of misadventure at the time of the procedure: Secondary | ICD-10-CM | POA: Insufficient documentation

## 2013-02-14 DIAGNOSIS — K3184 Gastroparesis: Secondary | ICD-10-CM | POA: Insufficient documentation

## 2013-02-14 DIAGNOSIS — Z79899 Other long term (current) drug therapy: Secondary | ICD-10-CM | POA: Insufficient documentation

## 2013-02-14 DIAGNOSIS — T82898A Other specified complication of vascular prosthetic devices, implants and grafts, initial encounter: Secondary | ICD-10-CM

## 2013-02-14 DIAGNOSIS — Z7982 Long term (current) use of aspirin: Secondary | ICD-10-CM | POA: Insufficient documentation

## 2013-02-14 DIAGNOSIS — Z794 Long term (current) use of insulin: Secondary | ICD-10-CM | POA: Insufficient documentation

## 2013-02-14 DIAGNOSIS — I871 Compression of vein: Secondary | ICD-10-CM | POA: Insufficient documentation

## 2013-02-14 DIAGNOSIS — G589 Mononeuropathy, unspecified: Secondary | ICD-10-CM | POA: Insufficient documentation

## 2013-02-14 DIAGNOSIS — I739 Peripheral vascular disease, unspecified: Secondary | ICD-10-CM | POA: Insufficient documentation

## 2013-02-14 DIAGNOSIS — I120 Hypertensive chronic kidney disease with stage 5 chronic kidney disease or end stage renal disease: Secondary | ICD-10-CM | POA: Insufficient documentation

## 2013-02-14 DIAGNOSIS — E1149 Type 2 diabetes mellitus with other diabetic neurological complication: Secondary | ICD-10-CM | POA: Insufficient documentation

## 2013-02-14 DIAGNOSIS — K219 Gastro-esophageal reflux disease without esophagitis: Secondary | ICD-10-CM | POA: Insufficient documentation

## 2013-02-14 DIAGNOSIS — N186 End stage renal disease: Secondary | ICD-10-CM | POA: Insufficient documentation

## 2013-02-14 DIAGNOSIS — E039 Hypothyroidism, unspecified: Secondary | ICD-10-CM | POA: Insufficient documentation

## 2013-02-14 HISTORY — PX: SHUNTOGRAM: SHX5491

## 2013-02-14 LAB — POCT I-STAT, CHEM 8
Calcium, Ion: 1.03 mmol/L — ABNORMAL LOW (ref 1.12–1.23)
HCT: 34 % — ABNORMAL LOW (ref 36.0–46.0)
Sodium: 136 mEq/L (ref 135–145)
TCO2: 27 mmol/L (ref 0–100)

## 2013-02-14 SURGERY — ASSESSMENT, SHUNT FUNCTION, WITH CONTRAST RADIOGRAPHIC STUDY
Anesthesia: LOCAL | Laterality: Left

## 2013-02-14 MED ORDER — HEPARIN (PORCINE) IN NACL 2-0.9 UNIT/ML-% IJ SOLN
INTRAMUSCULAR | Status: AC
  Filled 2013-02-14: qty 500

## 2013-02-14 MED ORDER — ACETAMINOPHEN 325 MG RE SUPP: 325.0000 mg | RECTAL | Status: DC | PRN

## 2013-02-14 MED ORDER — MIDAZOLAM HCL 2 MG/2ML IJ SOLN
INTRAMUSCULAR | Status: AC
  Filled 2013-02-14: qty 2

## 2013-02-14 MED ORDER — SODIUM CHLORIDE 0.9 % IJ SOLN: 3.0000 mL | INTRAMUSCULAR | Status: DC | PRN

## 2013-02-14 MED ORDER — LIDOCAINE HCL (PF) 1 % IJ SOLN
INTRAMUSCULAR | Status: AC
  Filled 2013-02-14: qty 30

## 2013-02-14 MED ORDER — LABETALOL HCL 5 MG/ML IV SOLN: 10.0000 mg | INTRAVENOUS | Status: DC | PRN

## 2013-02-14 MED ORDER — ONDANSETRON HCL 4 MG/2ML IJ SOLN: 4.0000 mg | Freq: Four times a day (QID) | INTRAMUSCULAR | Status: DC | PRN

## 2013-02-14 MED ORDER — ONDANSETRON HCL 4 MG/2ML IJ SOLN
INTRAMUSCULAR | Status: AC
  Filled 2013-02-14: qty 2

## 2013-02-14 MED ORDER — HEPARIN SODIUM (PORCINE) 1000 UNIT/ML IJ SOLN
INTRAMUSCULAR | Status: AC
  Filled 2013-02-14: qty 1

## 2013-02-14 MED ORDER — PHENOL 1.4 % MT LIQD: 1.0000 | OROMUCOSAL | Status: DC | PRN

## 2013-02-14 MED ORDER — ACETAMINOPHEN 325 MG PO TABS: 325.0000 mg | ORAL_TABLET | ORAL | Status: DC | PRN

## 2013-02-14 MED ORDER — FENTANYL CITRATE 0.05 MG/ML IJ SOLN
INTRAMUSCULAR | Status: AC
  Filled 2013-02-14: qty 2

## 2013-02-14 MED ORDER — HYDRALAZINE HCL 20 MG/ML IJ SOLN: 10.0000 mg | INTRAMUSCULAR | Status: DC | PRN

## 2013-02-14 NOTE — H&P (Signed)
[facility]   Patient name: [name] MRN: [id number] DOB: 12-19-1960 Sex: female    No chief complaint on file.   HISTORY OF PRESENT ILLNESS: The patient is having trouble with dialysis access. She comes in today for shuntogram. She has previous undergone stenting and balloon angioplasty of in-stent stenosis.  Past Medical History  Diagnosis Date  . Diabetes mellitus   . Supraventricular tachycardia   . Renal disorder     Hemodialysis T, TH, Sat  . Thyroid disease   . Renal disorder   . Diabetes mellitus without complication   . Hypertension   . Thyroid disorder   . GERD (gastroesophageal reflux disease)   . Neuropathy   . Hypothyroidism   . Peripheral vascular disease   . Anemia   . Hemodialysis patient   . Hyperlipidemia   . SVT (supraventricular tachycardia)   . Sickle cell trait   . Gastroparesis due to DM     Past Surgical History  Procedure Laterality Date  . Abdominal hysterectomy    . Knee surgery      right  . Thyroid surgery    . Thyroidectomy    . Knee arthroplasty    . Cataract extraction, bilateral    . Colonoscopy  07/19/2012    Procedure: COLONOSCOPY;  Surgeon: Beryle Beams, MD;  Location: [facility];  Service: Endoscopy;  Laterality: N/A;    History   Social History  . Marital Status: Divorced    Spouse Name: N/A    Number of Children: N/A  . Years of Education: N/A   Occupational History  . Not on file.   Social History Main Topics  . Smoking status: Never Smoker   . Smokeless tobacco: Not on file  . Alcohol Use: No  . Drug Use: No  . Sexually Active:    Other Topics Concern  . Not on file   Social History Narrative   ** Merged History Encounter **        No family history on file.  Allergies as of 02/07/2013  . (No Known Allergies)    No current facility-administered medications on file prior to encounter.   Current Outpatient Prescriptions on File Prior to Encounter  Medication  Sig Dispense Refill  . aspirin 81 MG chewable tablet Chew 81 mg by mouth daily.      . cloNIDine (CATAPRES) 0.2 MG tablet Take 0.2 mg by mouth 2 (two) times daily.      Marland Kitchen esomeprazole (NEXIUM) 40 MG capsule Take 40 mg by mouth daily before breakfast.      . insulin lispro protamine-insulin lispro (HUMALOG 75/25) (75-25) 100 UNIT/ML SUSP Inject 0-25 Units into the skin See admin instructions. 25 units every morning. 0-10 units SSI in the evening.      Marland Kitchen levothyroxine (SYNTHROID, LEVOTHROID) 125 MCG tablet Take 125 mcg by mouth daily before breakfast.       . metoprolol (LOPRESSOR) 100 MG tablet Take 100 mg by mouth daily. Tartrate once daily      . multivitamin (RENA-VIT) TABS tablet Take 1 tablet by mouth daily.      Marland Kitchen oxyCODONE-acetaminophen (PERCOCET/ROXICET) 5-325 MG per tablet Take 1 tablet by mouth every 8 (eight) hours as needed for pain.       . sevelamer (RENVELA) 800 MG tablet Take 2,400-3,200 mg by mouth See admin instructions. Take 4 tablets three times a day with meals and 3 tablets with snacks.      . simvastatin (ZOCOR)  20 MG tablet Take 20 mg by mouth every evening.          PHYSICAL EXAMINATION:   Vital signs are BP 100/58  Pulse 63  Temp(Src) 97.4 F (36.3 C) (Oral)  Resp 20  Ht 5\' 2"  (1.575 m)  Wt 195 lb (88.451 kg)  BMI 35.66 kg/m2  SpO2 99% General: The patient appears their stated age. HEENT:  No gross abnormalities Pulmonary:  Non labored breathing  Psychiatric: The patient has normal affect. Cardiovascular: Thrill within the graft   Diagnostic Studies I have reviewed her imaging studies done several weeks ago in radiology. She had a in-stent stenosis treated at that time.  Assessment: End-stage renal disease Plan: Shuntogram with intervention as indicated  V. Leia Alf, M.D. [facility] Office: [phone] Pager:  [phone]

## 2013-02-14 NOTE — Op Note (Signed)
[facility]  Patient name: [name] MRN: [id number] DOB: Feb 06, 1961 Sex: female  02/14/2013 Pre-operative Diagnosis: Poorly functioning left upper arm dialysis graft Post-operative diagnosis:  Same Surgeon:  Eldridge Abrahams Procedure Performed:  1.  ultrasound-guided access left upper arm graft  2.  shuntogram  3.  angioplasty, left subclavian vein  4.  angioplasty, left brachial vein  5.  stent, left arm dialysis graft graft     Indications:  The patient continues to have difficulty with dialysis flow rates. She also has tenderness around her graft comes in for further evaluation  Procedure:  The patient was identified in the holding area and taken to room 8.  The patient was then placed supine on the table and prepped and draped in the usual sterile fashion.  A time out was called.  Ultrasound was used to evaluate the fistula.  The vein was patent and compressible.  A digital ultrasound image was acquired.  The fistula was then accessed under ultrasound guidance using a micropuncture needle.  An 018 wire was then asvanced without resistance and a micropuncture sheath was placed.  Contrast injections were then performed through the sheath.  Findings:  The dialysis graft is patent throughout it's course. There is a long stent within the venous outflow tract. There is an area of narrowing within the stent at the proximal and distal ends as well as an area of narrowing in the native subclavian vein. 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 This is not flow limiting The arterial venous anastomosis is widely patent.   Intervention:  After the above images were obtained, the decision was made to proceed with intervention. Over an 035 wire, a 7 French sheath was placed. The patient was heparinized. Using a Kumpe catheter and a Benson wire, wire access was obtained into the  central venous system. I selected a 7 x 40 Mustang balloon. I performed balloon angioplasty at the proximal and distal ends of the stent. Each inflation was held up for 1 minute. Followup arteriogram revealed possible extravasation at the distal end of the stent. I therefore reinserted the balloon and held up for 1 minute. I also performed balloon venoplasty just proximal to the stent in the area of the dissection. After the next imaging the extravasation had resolved, however there appeared to be a narrowing in the native subclavian vein. I then reinserted the 7 x 40 balloon and address this area perform balloon venoplasty, taking the balloon to burst pressure and holding it up for 1 minute. Completion venography then revealed acceptable result of the intervening segments.  Because the patient had significant pain over an area consistent with a pseudoaneurysm, I decided to exclude this with a covered stent. I switched out to an 014 wire and then deployed an 8 x 50 Viabahn stent across the pseudoaneurysm. It was molded with a 8 mm balloon. Followup arteriogram revealed resolution of the pseudoaneurysm. This point the wires were removed and the sheath was removed with a suture placed for hemostasis. There no complications the  Impression:  #1  recurrent stenosis at both the proximal and distal end of the previously placed stent within the venous outflow tract. These were dilated with a 7 x 40 balloon.  #2  stenosis within the native subclavian vein which responded well to venoplasty with a 7 x 40 balloon  #3  successful exclusion of a painful pseudoaneurysm  within the native dialysis graft using an 8 x 50 Viabahn    Theotis Burrow, M.D. [facility] Office: [phone] Pager:  [phone]

## 2013-02-15 LAB — GLUCOSE, CAPILLARY: Glucose-Capillary: 188 mg/dL — ABNORMAL HIGH (ref 70–99)

## 2013-03-09 ENCOUNTER — Other Ambulatory Visit: Payer: Self-pay

## 2013-03-09 DIAGNOSIS — T82598A Other mechanical complication of other cardiac and vascular devices and implants, initial encounter: Secondary | ICD-10-CM

## 2013-04-05 ENCOUNTER — Encounter: Payer: Self-pay | Admitting: Vascular Surgery

## 2013-04-06 ENCOUNTER — Encounter (INDEPENDENT_AMBULATORY_CARE_PROVIDER_SITE_OTHER): Payer: Medicare Other | Admitting: *Deleted

## 2013-04-06 ENCOUNTER — Encounter: Payer: Self-pay | Admitting: Vascular Surgery

## 2013-04-06 ENCOUNTER — Ambulatory Visit (INDEPENDENT_AMBULATORY_CARE_PROVIDER_SITE_OTHER): Payer: Medicare Other | Admitting: Vascular Surgery

## 2013-04-06 VITALS — BP 135/69 | HR 68 | Ht 62.0 in | Wt 204.0 lb

## 2013-04-06 DIAGNOSIS — T82598A Other mechanical complication of other cardiac and vascular devices and implants, initial encounter: Secondary | ICD-10-CM | POA: Insufficient documentation

## 2013-04-06 DIAGNOSIS — N186 End stage renal disease: Secondary | ICD-10-CM

## 2013-04-06 DIAGNOSIS — E1122 Type 2 diabetes mellitus with diabetic chronic kidney disease: Secondary | ICD-10-CM | POA: Insufficient documentation

## 2013-04-06 NOTE — Progress Notes (Signed)
[facility] [place]  Established Dialysis Access  History of Present Illness  [name] is a 52 y.o. (April 17, 1961) female who presents for re-evaluation of left UA AVG.  The patient is R hand dominant.  Previous access procedures have been completed in the L arm.  The patient's complication from previous access procedures include: thrombosis.  The patient has had two stents placed in this access.  The patient continues have poor flow rates despite multiple interventions on this graft.  By report, they are having difficulties cannulating this graft.  Past Medical History  Diagnosis Date  . Diabetes mellitus   . Supraventricular tachycardia   . Renal disorder     Hemodialysis T, TH, Sat  . Thyroid disease   . Renal disorder   . Diabetes mellitus without complication   . Hypertension   . Thyroid disorder   . GERD (gastroesophageal reflux disease)   . Neuropathy   . Hypothyroidism   . Peripheral vascular disease   . Anemia   . Hemodialysis patient   . Hyperlipidemia   . SVT (supraventricular tachycardia)   . Sickle cell trait   . Gastroparesis due to DM     Past Surgical History  Procedure Laterality Date  . Abdominal hysterectomy    . Knee surgery      right  . Thyroid surgery    . Thyroidectomy    . Knee arthroplasty    . Cataract extraction, bilateral    . Colonoscopy  07/19/2012    Procedure: COLONOSCOPY;  Surgeon: Beryle Beams, MD;  Location: [facility];  Service: Endoscopy;  Laterality: N/A;    History   Social History  . Marital Status: Divorced    Spouse Name: N/A    Number of Children: N/A  . Years of Education: N/A   Occupational History  . Not on file.   Social History Main Topics  . Smoking status: Former Smoker    Types: Cigarettes    Quit date: 09/20/1993  . Smokeless tobacco: Not on file  . Alcohol Use: No  . Drug Use: Yes    Special: Marijuana     Comment: abused drugs in the past  . Sexually Active: Not on file    Other Topics Concern  . Not on file   Social History Narrative   ** Merged History Encounter **       Family History  Problem Relation Age of Onset  . Diabetes Mother   . Hypertension Mother   . Heart attack Mother   . Diabetes Father   . Hypertension Father    Current Outpatient Prescriptions on File Prior to Visit  Medication Sig Dispense Refill  . amLODipine (NORVASC) 5 MG tablet Take 10 mg by mouth at bedtime.       Marland Kitchen aspirin 81 MG chewable tablet Chew 81 mg by mouth daily.      . cloNIDine (CATAPRES) 0.2 MG tablet Take 0.2 mg by mouth 2 (two) times daily.      Marland Kitchen esomeprazole (NEXIUM) 40 MG capsule Take 40 mg by mouth daily before breakfast.      . insulin lispro protamine-insulin lispro (HUMALOG 75/25) (75-25) 100 UNIT/ML SUSP Inject 0-25 Units into the skin See admin instructions. 25 units every morning. 0-10 units SSI in the evening.      Marland Kitchen levothyroxine (SYNTHROID, LEVOTHROID) 125 MCG tablet Take 125 mcg by mouth daily before breakfast.       . metoprolol (LOPRESSOR) 100 MG tablet Take 100  mg by mouth daily. Tartrate once daily      . oxyCODONE-acetaminophen (PERCOCET/ROXICET) 5-325 MG per tablet Take 1 tablet by mouth every 8 (eight) hours as needed for pain.       . pregabalin (LYRICA) 75 MG capsule Take 75 mg by mouth at bedtime.      . promethazine (PHENERGAN) 25 MG tablet Take 25 mg by mouth every 8 (eight) hours as needed for nausea.      . sevelamer (RENVELA) 800 MG tablet Take 2,400-3,200 mg by mouth See admin instructions. Take 4 tablets three times a day with meals and 3 tablets with snacks.      . simvastatin (ZOCOR) 20 MG tablet Take 20 mg by mouth every evening.      . multivitamin (RENA-VIT) TABS tablet Take 1 tablet by mouth daily.       No current facility-administered medications on file prior to visit.    No Known Allergies  REVIEW OF SYSTEMS:  (Positives checked otherwise negative)  CARDIOVASCULAR:  []  chest pain, []  chest pressure, []   palpitations, []  shortness of breath when laying flat, []  shortness of breath with exertion,  []  pain in feet when walking, []  pain in feet when laying flat, []  history of blood clot in veins (DVT), []  history of phlebitis, []  swelling in legs, []  varicose veins  PULMONARY:  []  productive cough, []  asthma, []  wheezing  NEUROLOGIC:  []  weakness in arms or legs, []  numbness in arms or legs, []  difficulty speaking or slurred speech, []  temporary loss of vision in one eye, []  dizziness  HEMATOLOGIC:  []  bleeding problems, []  problems with blood clotting too easily  MUSCULOSKEL:  []  joint pain, []  joint swelling  GASTROINTEST:  []  vomiting blood, []  blood in stool     GENITOURINARY:  []  burning with urination, []  blood in urine  PSYCHIATRIC:  []  history of major depression  INTEGUMENTARY:  []  rashes, []  ulcers     Physical Examination  Filed Vitals:   04/06/13 1609  BP: 135/69  Pulse: 68  Height: 5\' 2"  (1.575 m)  Weight: 204 lb (92.534 kg)  SpO2: 100%   Body mass index is 37.3 kg/(m^2).  General: A&O x 3, WD, obese  Pulmonary: Sym exp, good air movt, CTAB, no rales, rhonchi, & wheezing  Cardiac: RRR, Nl S1, S2, no Murmurs, rubs or gallops  Gastrointestinal: soft, NTND, -G/R, - HSM, - masses, - CVAT B  Musculoskeletal: M/S 5/5 throughout , Extremities without  ischemic changes , weakly palpable thrill in access in LUA AVG, + bruit in access  Neurologic: Pain and light touch intact in extremities , Motor exam as listed above  Non-Invasive Vascular Imaging  L arm access duplex (Date: 04/06/13):   Patent Stent on arterial side  Stent extending into L SCV with multiple stenoses  Proximal to venous stent is dissection vs mobile thrombus vs plaque  Medical Decision Making  [name] is a 52 y.o. female who presents with Failing LUA AVG, ESRD requiring hemodialysis.   I suspect this patient LUA AVG is running out of options.  I would check a LUE  arterial duplex to  see if there are inflow issues to this graft.  The graft now has two stents and it is only a matter of time before this graft occludes.  There are no further graft options in this left arm.    The patient will follow up in two weeks for re-evaluation of this arm with duplex.  Adele Barthel,  MD [facility] [place] Office: [phone] Pager: [phone]  04/06/2013, 6:41 PM

## 2013-04-11 ENCOUNTER — Other Ambulatory Visit: Payer: Self-pay | Admitting: *Deleted

## 2013-04-11 DIAGNOSIS — N186 End stage renal disease: Secondary | ICD-10-CM

## 2013-04-11 DIAGNOSIS — Z01810 Encounter for preprocedural cardiovascular examination: Secondary | ICD-10-CM

## 2013-04-19 ENCOUNTER — Encounter: Payer: Self-pay | Admitting: Vascular Surgery

## 2013-04-20 ENCOUNTER — Encounter (INDEPENDENT_AMBULATORY_CARE_PROVIDER_SITE_OTHER): Payer: Medicare Other | Admitting: *Deleted

## 2013-04-20 ENCOUNTER — Ambulatory Visit: Payer: Medicare Other | Admitting: Vascular Surgery

## 2013-04-20 ENCOUNTER — Encounter: Payer: Self-pay | Admitting: Vascular Surgery

## 2013-04-20 ENCOUNTER — Ambulatory Visit (INDEPENDENT_AMBULATORY_CARE_PROVIDER_SITE_OTHER): Payer: Medicare Other | Admitting: Vascular Surgery

## 2013-04-20 VITALS — BP 143/73 | HR 62 | Resp 16 | Ht 62.0 in | Wt 214.0 lb

## 2013-04-20 DIAGNOSIS — Z01810 Encounter for preprocedural cardiovascular examination: Secondary | ICD-10-CM

## 2013-04-20 DIAGNOSIS — N186 End stage renal disease: Secondary | ICD-10-CM

## 2013-04-20 DIAGNOSIS — M79609 Pain in unspecified limb: Secondary | ICD-10-CM | POA: Insufficient documentation

## 2013-04-20 NOTE — Progress Notes (Signed)
[facility] [place]  Established Dialysis Access  History of Present Illness  [name] is a 52 y.o. ([birth year]) female who presents for re-evaluation for of LUA AVG.  The patient returns today for L arm arterial duplex.  The pt continues to complain of radiation of pain up L arm with dialysis ab difficulties with cannulation.  She does not successful HD over the last 3 runs.  She denies any weakness in L hand  The patient's PMH, PSH, SH, FamHx, Med, and Allergies are unchanged from 04/06/13.  On ROS today: L arm radiating pain, no motor weakness  Physical Examination  Filed Vitals:   04/20/13 1004  BP: 143/73  Pulse: 62  Resp: 16  Height: 5\' 2"  (1.575 m)  Weight: 214 lb (97.07 kg)  SpO2: 98%   Body mass index is 39.13 kg/(m^2).  General: A&O x 3, WD, obese   Pulmonary: Sym exp, good air movt, CTAB, no rales, rhonchi, & wheezing   Cardiac: RRR, Nl S1, S2, no Murmurs, rubs or gallops   Gastrointestinal: soft, NTND, -G/R, - HSM, - masses, - CVAT B   Musculoskeletal: M/S 5/5 throughout , Extremities without ischemic changes , weakly palpable thrill in access in LUA AVG, + bruit in access   Neurologic: Pain and light touch intact in extremities , Motor exam as listed above  Non-Invasive Vascular Imaging  left arm arterial Duplex  (Date: 04/20/2013):   Monophasic subclavian, axillary and brachial arteries  Triphasic radial  Biphasic ulnar  Medical Decision Making  [name] is a 52 y.o. female who presents with Failing LUA AVG, ESRD requiring hemodialysis.    Based on arterial duplex, I suggested the patient consider an Arch aortogram with left subclavian artery angiogram with possible intervention.  I discussed with the patient the nature of angiographic procedures, especially the limited patencies of any endovascular intervention.  The patient is aware of that the risks of an angiographic procedure include but are not limited to: bleeding,  infection, access site complications, renal failure, embolization, rupture of vessel, dissection, possible need for emergent surgical intervention, possible need for surgical procedures to treat the patient's pathology, anaphylactic reaction to contrast, and stroke and death.    The patient is frustrated at this point would like to consider the option prior to proceeding.  Adele Barthel, MD [facility] [place] Office: [phone] Pager: [phone]  04/20/2013, 6:31 PM

## 2013-06-04 ENCOUNTER — Emergency Department (HOSPITAL_COMMUNITY): Payer: Medicare Other

## 2013-06-04 ENCOUNTER — Inpatient Hospital Stay (HOSPITAL_COMMUNITY)
Admission: EM | Admit: 2013-06-04 | Discharge: 2013-06-08 | DRG: 606 | Disposition: A | Discharge status: Home / Self Care | Payer: Medicare Other | Attending: Family Medicine | Admitting: Family Medicine

## 2013-06-04 DIAGNOSIS — I469 Cardiac arrest, cause unspecified: Secondary | ICD-10-CM

## 2013-06-04 DIAGNOSIS — G609 Hereditary and idiopathic neuropathy, unspecified: Secondary | ICD-10-CM | POA: Diagnosis present

## 2013-06-04 DIAGNOSIS — E1122 Type 2 diabetes mellitus with diabetic chronic kidney disease: Secondary | ICD-10-CM | POA: Diagnosis present

## 2013-06-04 DIAGNOSIS — R509 Fever, unspecified: Secondary | ICD-10-CM | POA: Diagnosis present

## 2013-06-04 DIAGNOSIS — M899 Disorder of bone, unspecified: Secondary | ICD-10-CM | POA: Diagnosis present

## 2013-06-04 DIAGNOSIS — N186 End stage renal disease: Secondary | ICD-10-CM | POA: Diagnosis present

## 2013-06-04 DIAGNOSIS — A419 Sepsis, unspecified organism: Secondary | ICD-10-CM

## 2013-06-04 DIAGNOSIS — E785 Hyperlipidemia, unspecified: Secondary | ICD-10-CM | POA: Diagnosis present

## 2013-06-04 DIAGNOSIS — E669 Obesity, unspecified: Secondary | ICD-10-CM

## 2013-06-04 DIAGNOSIS — D649 Anemia, unspecified: Secondary | ICD-10-CM | POA: Diagnosis present

## 2013-06-04 DIAGNOSIS — Z79899 Other long term (current) drug therapy: Secondary | ICD-10-CM

## 2013-06-04 DIAGNOSIS — I498 Other specified cardiac arrhythmias: Secondary | ICD-10-CM | POA: Diagnosis present

## 2013-06-04 DIAGNOSIS — T82898A Other specified complication of vascular prosthetic devices, implants and grafts, initial encounter: Secondary | ICD-10-CM | POA: Diagnosis present

## 2013-06-04 DIAGNOSIS — R112 Nausea with vomiting, unspecified: Secondary | ICD-10-CM | POA: Diagnosis present

## 2013-06-04 DIAGNOSIS — M79609 Pain in unspecified limb: Secondary | ICD-10-CM

## 2013-06-04 DIAGNOSIS — N289 Disorder of kidney and ureter, unspecified: Secondary | ICD-10-CM

## 2013-06-04 DIAGNOSIS — Z794 Long term (current) use of insulin: Secondary | ICD-10-CM

## 2013-06-04 DIAGNOSIS — I739 Peripheral vascular disease, unspecified: Secondary | ICD-10-CM | POA: Diagnosis present

## 2013-06-04 DIAGNOSIS — I10 Essential (primary) hypertension: Secondary | ICD-10-CM

## 2013-06-04 DIAGNOSIS — E890 Postprocedural hypothyroidism: Secondary | ICD-10-CM | POA: Diagnosis present

## 2013-06-04 DIAGNOSIS — E119 Type 2 diabetes mellitus without complications: Secondary | ICD-10-CM

## 2013-06-04 DIAGNOSIS — Y832 Surgical operation with anastomosis, bypass or graft as the cause of abnormal reaction of the patient, or of later complication, without mention of misadventure at the time of the procedure: Secondary | ICD-10-CM | POA: Diagnosis present

## 2013-06-04 DIAGNOSIS — T82598A Other mechanical complication of other cardiac and vascular devices and implants, initial encounter: Secondary | ICD-10-CM

## 2013-06-04 DIAGNOSIS — Z992 Dependence on renal dialysis: Secondary | ICD-10-CM

## 2013-06-04 DIAGNOSIS — E079 Disorder of thyroid, unspecified: Secondary | ICD-10-CM

## 2013-06-04 DIAGNOSIS — K219 Gastro-esophageal reflux disease without esophagitis: Secondary | ICD-10-CM | POA: Diagnosis present

## 2013-06-04 DIAGNOSIS — B009 Herpesviral infection, unspecified: Principal | ICD-10-CM | POA: Diagnosis present

## 2013-06-04 DIAGNOSIS — Z87891 Personal history of nicotine dependence: Secondary | ICD-10-CM

## 2013-06-04 DIAGNOSIS — D573 Sickle-cell trait: Secondary | ICD-10-CM | POA: Diagnosis present

## 2013-06-04 DIAGNOSIS — J9601 Acute respiratory failure with hypoxia: Secondary | ICD-10-CM

## 2013-06-04 DIAGNOSIS — K3184 Gastroparesis: Secondary | ICD-10-CM | POA: Diagnosis present

## 2013-06-04 DIAGNOSIS — N2581 Secondary hyperparathyroidism of renal origin: Secondary | ICD-10-CM | POA: Diagnosis present

## 2013-06-04 DIAGNOSIS — I471 Supraventricular tachycardia: Secondary | ICD-10-CM

## 2013-06-04 DIAGNOSIS — E1149 Type 2 diabetes mellitus with other diabetic neurological complication: Secondary | ICD-10-CM | POA: Diagnosis present

## 2013-06-04 DIAGNOSIS — Z7982 Long term (current) use of aspirin: Secondary | ICD-10-CM

## 2013-06-04 DIAGNOSIS — F1210 Cannabis abuse, uncomplicated: Secondary | ICD-10-CM | POA: Diagnosis present

## 2013-06-04 LAB — COMPREHENSIVE METABOLIC PANEL
BUN: 15 mg/dL (ref 6–23)
Calcium: 9 mg/dL (ref 8.4–10.5)
Creatinine, Ser: 5.09 mg/dL — ABNORMAL HIGH (ref 0.50–1.10)
GFR calc Af Amer: 10 mL/min — ABNORMAL LOW (ref >90)
Glucose, Bld: 249 mg/dL — ABNORMAL HIGH (ref 70–99)
Total Protein: 8.2 g/dL (ref 6.0–8.3)

## 2013-06-04 LAB — PROTIME-INR
INR: 0.97 (ref 0.00–1.49)
Prothrombin Time: 12.7 seconds (ref 11.6–15.2)

## 2013-06-04 LAB — LACTIC ACID, PLASMA: Lactic Acid, Venous: 2.7 mmol/L — ABNORMAL HIGH (ref 0.5–2.2)

## 2013-06-04 LAB — CBC
HCT: 35.1 % — ABNORMAL LOW (ref 36.0–46.0)
Hemoglobin: 11.3 g/dL — ABNORMAL LOW (ref 12.0–15.0)
MCH: 24.3 pg — ABNORMAL LOW (ref 26.0–34.0)
MCHC: 32.2 g/dL (ref 30.0–36.0)

## 2013-06-04 LAB — URINALYSIS, ROUTINE W REFLEX MICROSCOPIC
Bilirubin Urine: NEGATIVE
Nitrite: NEGATIVE
Specific Gravity, Urine: 1.015 (ref 1.005–1.030)
Urobilinogen, UA: 0.2 mg/dL (ref 0.0–1.0)

## 2013-06-04 LAB — URINE MICROSCOPIC-ADD ON

## 2013-06-04 LAB — TYPE AND SCREEN: ABO/RH(D): A POS

## 2013-06-04 MED ORDER — SODIUM CHLORIDE 0.9 % IV BOLUS (SEPSIS)
500.0000 mL | INTRAVENOUS | Status: DC | PRN
  Administered 2013-06-04: 500 mL via INTRAVENOUS

## 2013-06-04 MED ORDER — PIPERACILLIN-TAZOBACTAM IN DEX 2-0.25 GM/50ML IV SOLN
2.2500 g | Freq: Three times a day (TID) | INTRAVENOUS | Status: DC
  Administered 2013-06-05 – 2013-06-07 (×8): 2.25 g via INTRAVENOUS
  Filled 2013-06-04 (×12): qty 50

## 2013-06-04 MED ORDER — ONDANSETRON 4 MG PO TBDP
8.0000 mg | ORAL_TABLET | Freq: Once | ORAL | Status: AC
  Administered 2013-06-04: 8 mg via ORAL
  Filled 2013-06-04: qty 2

## 2013-06-04 MED ORDER — ONDANSETRON HCL 4 MG/2ML IJ SOLN
4.0000 mg | Freq: Once | INTRAMUSCULAR | Status: AC
  Administered 2013-06-05: 4 mg via INTRAVENOUS
  Filled 2013-06-04: qty 2

## 2013-06-04 MED ORDER — ACETAMINOPHEN 325 MG PO TABS
650.0000 mg | ORAL_TABLET | Freq: Once | ORAL | Status: AC
  Administered 2013-06-04: 650 mg via ORAL
  Filled 2013-06-04: qty 2

## 2013-06-04 MED ORDER — VANCOMYCIN HCL IN DEXTROSE 1-5 GM/200ML-% IV SOLN
1000.0000 mg | INTRAVENOUS | Status: DC
  Administered 2013-06-06: 1000 mg via INTRAVENOUS
  Filled 2013-06-04 (×2): qty 200

## 2013-06-04 MED ORDER — VANCOMYCIN HCL IN DEXTROSE 1-5 GM/200ML-% IV SOLN
1000.0000 mg | Freq: Once | INTRAVENOUS | Status: AC
  Administered 2013-06-04: 1000 mg via INTRAVENOUS
  Filled 2013-06-04: qty 200

## 2013-06-04 MED ORDER — VANCOMYCIN HCL IN DEXTROSE 1-5 GM/200ML-% IV SOLN
1000.0000 mg | Freq: Once | INTRAVENOUS | Status: AC
  Filled 2013-06-04: qty 200

## 2013-06-04 MED ORDER — SODIUM CHLORIDE 0.9 % IV SOLN
INTRAVENOUS | Status: AC
  Administered 2013-06-05: 01:00:00 via INTRAVENOUS

## 2013-06-04 MED ORDER — ONDANSETRON HCL 4 MG/2ML IJ SOLN: 4.0000 mg | Freq: Three times a day (TID) | INTRAMUSCULAR | Status: DC | PRN

## 2013-06-04 MED ORDER — PIPERACILLIN-TAZOBACTAM 3.375 G IVPB 30 MIN
3.3750 g | Freq: Once | INTRAVENOUS | Status: AC
  Administered 2013-06-04: 3.375 g via INTRAVENOUS
  Filled 2013-06-04: qty 50

## 2013-06-04 MED ORDER — VANCOMYCIN HCL IN DEXTROSE 1-5 GM/200ML-% IV SOLN
1000.0000 mg | Freq: Once | INTRAVENOUS | Status: AC
  Administered 2013-06-04: 1000 mg via INTRAVENOUS

## 2013-06-04 NOTE — ED Notes (Signed)
Per EMS: fever (100.7) at dialysis. Pt has dry heaves. Pt woke up this AM with chills and felt worse through out dialysis. Temperature increased from 98.7 to 100.7 at end of treatment. Dialysis states vitals were stable. 24 on right hand. Shunt accessed from dialysis. 4 mg zofran given and relief of nausea. Pt c.o chills. VS: BP 250/100 upon arrival and manual 194/72, pt states this is normal after dialysis before BP medication. CBG 201 HR 114 100% on RA. Pt completed dialysis.

## 2013-06-04 NOTE — Progress Notes (Signed)
ANTIBIOTIC CONSULT NOTE - INITIAL  Pharmacy Consult:  Vancomycin / Zosyn Indication:  Sepsis  No Known Allergies  Patient Measurements: Height: 5' 1.81" (157 cm) Weight: 214 lb 1.1 oz (97.1 kg) IBW/kg (Calculated) : 49.67  Vital Signs: Temp: 103.1 F (39.5 C) (09/15 1734) Temp src: Oral (09/15 1734) BP: 153/66 mmHg (09/15 1734) Pulse Rate: 107 (09/15 1734)  Labs: No results found for this basename: WBC, HGB, PLT, LABCREA, CREATININE,  in the last 72 hours Estimated Creatinine Clearance: 11.2 ml/min (by C-G formula based on Cr of 6.4). No results found for this basename: VANCOTROUGH, VANCOPEAK, VANCORANDOM, GENTTROUGH, GENTPEAK, GENTRANDOM, TOBRATROUGH, TOBRAPEAK, TOBRARND, AMIKACINPEAK, AMIKACINTROU, AMIKACIN,  in the last 72 hours   Microbiology: No results found for this or any previous visit (from the past 720 hour(s)).  Medical History: Past Medical History  Diagnosis Date  . Diabetes mellitus   . Supraventricular tachycardia   . Renal disorder     Hemodialysis T, TH, Sat  . Thyroid disease   . Renal disorder   . Diabetes mellitus without complication   . Hypertension   . Thyroid disorder   . GERD (gastroesophageal reflux disease)   . Neuropathy   . Hypothyroidism   . Peripheral vascular disease   . Anemia   . Hemodialysis patient   . Hyperlipidemia   . SVT (supraventricular tachycardia)   . Sickle cell trait   . Gastroparesis due to DM         Assessment: 33 YOF with history of ESRD on HD on MWF sent to [facility] from dialysis center with fever and emesis.  Pharmacy consulted to manage vancomycin and Zosyn for sepsis.  Noted first doses of antibiotics already ordered.   Goal of Therapy:  Vanc pre-HD level:  15-25 mcg/mL   Plan:  - Additional Vanc 1gm for a total of 2gm load, then 1gm IV q-HD MWF - Zosyn 2.25gm IV Q8H - Monitor clinical course, vanc pre-HD level as indicated    Ardian Haberland D. Mina Marble, PharmD, BCPS Pager:  [phone] 06/04/2013, 6:13  PM

## 2013-06-04 NOTE — ED Notes (Signed)
IV team notified to de-access dialysis graft-- lab here to draw

## 2013-06-04 NOTE — ED Provider Notes (Signed)
CSN: [id number]     Arrival date & time 06/04/13  1712 History   First MD Initiated Contact with Patient 06/04/13 1714     Chief Complaint  Patient presents with  . Fever  . Emesis   (Consider location/radiation/quality/duration/timing/severity/associated sxs/prior Treatment) HPI Comments: 52 year old female with an extensive past medical history including diabetes, end-stage renal disease and hypertension presents to the emergency department via EMS from the dialysis center with a fever. Patient states when she went to dialysis this morning, she was initially feeling fine and began to develop chills, at the end of treatment her temperature was 100.7 and the EMS was called. Upon EMS arrival, patient was dry heaving and nauseated. She was given 4 mg of Zofran which relieved her nausea. Patient states yesterday she went about her normal day. Dialyzes Monday, Wednesday and Friday. BP at dialysis noted to be high 250/100, manually 194/72, upon ED arrival 126/90. Patient states her nausea has returned. She does make a small amount of urine. Denies chest pain, sob, confusion.  Patient is a 52 y.o. female presenting with fever and vomiting. The history is provided by the patient and the EMS personnel.  Fever Associated symptoms: chills, nausea and vomiting   Associated symptoms: no chest pain   Emesis Associated symptoms: chills     Past Medical History  Diagnosis Date  . Diabetes mellitus   . Supraventricular tachycardia   . Renal disorder     Hemodialysis T, TH, Sat  . Thyroid disease   . Renal disorder   . Diabetes mellitus without complication   . Hypertension   . Thyroid disorder   . GERD (gastroesophageal reflux disease)   . Neuropathy   . Hypothyroidism   . Peripheral vascular disease   . Anemia   . Hemodialysis patient   . Hyperlipidemia   . SVT (supraventricular tachycardia)   . Sickle cell trait   . Gastroparesis due to DM    Past Surgical History  Procedure Laterality  Date  . Abdominal hysterectomy    . Knee surgery      right  . Thyroid surgery    . Thyroidectomy    . Knee arthroplasty    . Cataract extraction, bilateral    . Colonoscopy  07/19/2012    Procedure: COLONOSCOPY;  Surgeon: Beryle Beams, MD;  Location: [place];  Service: Endoscopy;  Laterality: N/A;  . Arteriovenous graft placement Left 2012    Dr. Harrington Challenger  2nd graft   Family History  Problem Relation Age of Onset  . Diabetes Mother   . Hypertension Mother   . Heart attack Mother   . Diabetes Father   . Hypertension Father    History  Substance Use Topics  . Smoking status: Former Smoker    Types: Cigarettes    Quit date: 09/20/1993  . Smokeless tobacco: Never Used  . Alcohol Use: No   OB History   Grav Para Term Preterm Abortions TAB SAB Ect Mult Living                 Review of Systems  Constitutional: Positive for fever and chills.  Respiratory: Negative for shortness of breath.   Cardiovascular: Negative for chest pain.  Gastrointestinal: Positive for nausea and vomiting.  All other systems reviewed and are negative.    Allergies  Review of patient's allergies indicates no known allergies.  Home Medications   Current Outpatient Rx  Name  Route  Sig  Dispense  Refill  . amLODipine (NORVASC)  10 MG tablet               . amLODipine (NORVASC) 5 MG tablet   Oral   Take 10 mg by mouth at bedtime.          Marland Kitchen aspirin 81 MG chewable tablet   Oral   Chew 81 mg by mouth daily.         . B Complex-C-Folic Acid (DIALYVITE PO)   Oral   Take 1 tablet by mouth daily.         . cloNIDine (CATAPRES) 0.2 MG tablet   Oral   Take 0.2 mg by mouth 2 (two) times daily.         Marland Kitchen esomeprazole (NEXIUM) 40 MG capsule   Oral   Take 40 mg by mouth daily before breakfast.         . HUMALOG MIX 75/25 KWIKPEN (75-25) 100 UNIT/ML SUPN               . insulin lispro protamine-insulin lispro (HUMALOG 75/25) (75-25) 100 UNIT/ML SUSP   Subcutaneous    Inject 0-25 Units into the skin See admin instructions. 25 units every morning. 0-10 units SSI in the evening.         Marland Kitchen levothyroxine (SYNTHROID, LEVOTHROID) 125 MCG tablet   Oral   Take 125 mcg by mouth daily before breakfast.          . metoprolol (LOPRESSOR) 100 MG tablet   Oral   Take 100 mg by mouth daily. Tartrate once daily         . multivitamin (RENA-VIT) TABS tablet   Oral   Take 1 tablet by mouth daily.         Marland Kitchen NOVOFINE 32G X 6 MM MISC               . oxyCODONE-acetaminophen (PERCOCET/ROXICET) 5-325 MG per tablet   Oral   Take 1 tablet by mouth every 8 (eight) hours as needed for pain.          . pravastatin (PRAVACHOL) 40 MG tablet   Oral   Take 40 mg by mouth daily.         . pregabalin (LYRICA) 75 MG capsule   Oral   Take 75 mg by mouth at bedtime.         . promethazine (PHENERGAN) 25 MG tablet   Oral   Take 25 mg by mouth every 8 (eight) hours as needed for nausea.         . sevelamer (RENVELA) 800 MG tablet   Oral   Take 2,400-3,200 mg by mouth See admin instructions. Take 4 tablets three times a day with meals and 3 tablets with snacks.         . simvastatin (ZOCOR) 20 MG tablet   Oral   Take 20 mg by mouth every evening.          BP 126/90  Pulse 110  Temp(Src) 103.1 F (39.5 C) (Oral)  SpO2 99% Physical Exam  Nursing note and vitals reviewed. Constitutional: She is oriented to person, place, and time. She appears well-developed and well-nourished. No distress.  Shaking chills.  HENT:  Head: Normocephalic and atraumatic.  Mouth/Throat: Oropharynx is clear and moist.  Eyes: Conjunctivae and EOM are normal. Pupils are equal, round, and reactive to light.  Neck: Normal range of motion. Neck supple.  Cardiovascular: Regular rhythm, normal heart sounds and intact distal pulses.  Tachycardia present.   No extremity  edema.  Pulmonary/Chest: Effort normal and breath sounds normal. No respiratory distress. She has no  decreased breath sounds. She has no wheezes. She has no rhonchi. She has no rales.  Abdominal: Soft. Bowel sounds are normal. There is generalized tenderness (palpation exacerbates nausea). There is no rigidity, no rebound and no guarding.  No peritoneal signs.  Musculoskeletal: Normal range of motion. She exhibits no edema.  Neurological: She is alert and oriented to person, place, and time.  Skin: Skin is warm. She is not diaphoretic.  Clammy  Psychiatric: She has a normal mood and affect. Her behavior is normal.    ED Course  Procedures (including critical care time) Labs Review Labs Reviewed  CBC - Abnormal; Notable for the following:    Hemoglobin 11.3 (*)    HCT 35.1 (*)    MCV 75.5 (*)    MCH 24.3 (*)    RDW 18.2 (*)    Platelets 127 (*)    All other components within normal limits  COMPREHENSIVE METABOLIC PANEL - Abnormal; Notable for the following:    Chloride 93 (*)    Glucose, Bld 249 (*)    Creatinine, Ser 5.09 (*)    Alkaline Phosphatase 245 (*)    GFR calc non Af Amer 9 (*)    GFR calc Af Amer 10 (*)    All other components within normal limits  LACTIC ACID, PLASMA - Abnormal; Notable for the following:    Lactic Acid, Venous 2.7 (*)    All other components within normal limits  URINALYSIS, ROUTINE W REFLEX MICROSCOPIC - Abnormal; Notable for the following:    Ketones, ur 15 (*)    Protein, ur 100 (*)    All other components within normal limits  URINE MICROSCOPIC-ADD ON - Abnormal; Notable for the following:    Squamous Epithelial / LPF FEW (*)    Casts HYALINE CASTS (*)    All other components within normal limits  GLUCOSE, CAPILLARY - Abnormal; Notable for the following:    Glucose-Capillary 282 (*)    All other components within normal limits  CULTURE, BLOOD (ROUTINE X 2)  CULTURE, BLOOD (ROUTINE X 2)  URINE CULTURE  TROPONIN I  PROTIME-INR  APTT  FIBRINOGEN  CORTISOL  TYPE AND SCREEN   Imaging Review Dg Chest Portable 1 View  06/04/2013    CLINICAL DATA:  Sepsis.  EXAM: PORTABLE CHEST - 1 VIEW  COMPARISON:  No priors.  FINDINGS: Lung volumes are normal. No consolidative airspace disease. No pleural effusions. No pneumothorax. No pulmonary nodule or mass noted. Pulmonary vasculature and the cardiomediastinal silhouette are within normal limits.  IMPRESSION: 1.  No radiographic evidence of acute cardiopulmonary disease.   Electronically Signed   By: Vinnie Langton M.D.   On: 06/04/2013 19:28    MDM   1. Fever of unknown origin     Patient presenting from dialysis with fever, chills and nausea. She has shaking chills in the emergency department. Temp 103.1, tachycardic. Not hypotensive, stable, no distress. Phase 1 sepsis workup pending. Lungs clear. IV fluids, zofran, IV abx zosyn and vancomycin. Case discussed with my attending Dr. Eulis Foster who agrees with plan of care.  No leukocytosis, urine clean, CXR without any acute abnormality. Patient is not complaining of pain at her graft site, non-tender. She will be admitted for fever of unknown origin. Admission accepted by Dr. Alcario Drought, [facility].  Illene Labrador, PA-C 06/05/13 [phone]

## 2013-06-05 ENCOUNTER — Encounter (HOSPITAL_COMMUNITY): Payer: Self-pay | Admitting: *Deleted

## 2013-06-05 DIAGNOSIS — A419 Sepsis, unspecified organism: Secondary | ICD-10-CM | POA: Diagnosis present

## 2013-06-05 LAB — BASIC METABOLIC PANEL
BUN: 24 mg/dL — ABNORMAL HIGH (ref 6–23)
CO2: 29 mEq/L (ref 19–32)
Chloride: 93 mEq/L — ABNORMAL LOW (ref 96–112)
Creatinine, Ser: 7.02 mg/dL — ABNORMAL HIGH (ref 0.50–1.10)
Glucose, Bld: 225 mg/dL — ABNORMAL HIGH (ref 70–99)

## 2013-06-05 LAB — URINE CULTURE: Colony Count: NO GROWTH

## 2013-06-05 LAB — GLUCOSE, CAPILLARY
Glucose-Capillary: 204 mg/dL — ABNORMAL HIGH (ref 70–99)
Glucose-Capillary: 259 mg/dL — ABNORMAL HIGH (ref 70–99)
Glucose-Capillary: 275 mg/dL — ABNORMAL HIGH (ref 70–99)

## 2013-06-05 LAB — CBC
HCT: 29.4 % — ABNORMAL LOW (ref 36.0–46.0)
MCH: 23.7 pg — ABNORMAL LOW (ref 26.0–34.0)
MCV: 74.2 fL — ABNORMAL LOW (ref 78.0–100.0)
RBC: 3.96 MIL/uL (ref 3.87–5.11)
WBC: 4.5 10*3/uL (ref 4.0–10.5)

## 2013-06-05 MED ORDER — SIMVASTATIN 20 MG PO TABS
20.0000 mg | ORAL_TABLET | Freq: Every evening | ORAL | Status: DC
  Filled 2013-06-05: qty 1

## 2013-06-05 MED ORDER — INFLUENZA VAC SPLIT QUAD 0.5 ML IM SUSP
0.5000 mL | INTRAMUSCULAR | Status: AC
  Administered 2013-06-06: 0.5 mL via INTRAMUSCULAR
  Filled 2013-06-05: qty 0.5

## 2013-06-05 MED ORDER — PROMETHAZINE HCL 25 MG PO TABS: 25.0000 mg | ORAL_TABLET | Freq: Three times a day (TID) | ORAL | Status: DC | PRN

## 2013-06-05 MED ORDER — SODIUM CHLORIDE 0.9 % IV SOLN
62.5000 mg | INTRAVENOUS | Status: DC
  Administered 2013-06-06: 62.5 mg via INTRAVENOUS
  Filled 2013-06-05 (×2): qty 5

## 2013-06-05 MED ORDER — SODIUM CHLORIDE 0.9 % IJ SOLN
3.0000 mL | Freq: Two times a day (BID) | INTRAMUSCULAR | Status: DC
  Administered 2013-06-05 – 2013-06-07 (×6): 3 mL via INTRAVENOUS

## 2013-06-05 MED ORDER — ACETAMINOPHEN 325 MG PO TABS
650.0000 mg | ORAL_TABLET | Freq: Four times a day (QID) | ORAL | Status: DC | PRN
  Administered 2013-06-05 – 2013-06-08 (×4): 650 mg via ORAL
  Filled 2013-06-05 (×3): qty 2

## 2013-06-05 MED ORDER — AMLODIPINE BESYLATE 10 MG PO TABS
10.0000 mg | ORAL_TABLET | Freq: Every day | ORAL | Status: DC
  Administered 2013-06-05 – 2013-06-08 (×4): 10 mg via ORAL
  Filled 2013-06-05 (×4): qty 1

## 2013-06-05 MED ORDER — RENA-VITE PO TABS
1.0000 | ORAL_TABLET | Freq: Every day | ORAL | Status: DC
  Administered 2013-06-05 – 2013-06-07 (×3): 1 via ORAL
  Filled 2013-06-05 (×4): qty 1

## 2013-06-05 MED ORDER — METOPROLOL TARTRATE 100 MG PO TABS
100.0000 mg | ORAL_TABLET | Freq: Every day | ORAL | Status: DC
  Administered 2013-06-05 – 2013-06-07 (×4): 100 mg via ORAL
  Filled 2013-06-05 (×5): qty 1

## 2013-06-05 MED ORDER — CLONIDINE HCL 0.2 MG PO TABS
0.2000 mg | ORAL_TABLET | Freq: Two times a day (BID) | ORAL | Status: DC
  Administered 2013-06-05 – 2013-06-07 (×7): 0.2 mg via ORAL
  Filled 2013-06-05 (×9): qty 1

## 2013-06-05 MED ORDER — PANTOPRAZOLE SODIUM 40 MG PO TBEC
80.0000 mg | DELAYED_RELEASE_TABLET | Freq: Every day | ORAL | Status: DC
  Administered 2013-06-05 – 2013-06-08 (×4): 80 mg via ORAL
  Filled 2013-06-05 (×4): qty 2

## 2013-06-05 MED ORDER — SEVELAMER CARBONATE 800 MG PO TABS
1600.0000 mg | ORAL_TABLET | ORAL | Status: DC | PRN
  Filled 2013-06-05: qty 2

## 2013-06-05 MED ORDER — DOXERCALCIFEROL 4 MCG/2ML IV SOLN
6.0000 ug | INTRAVENOUS | Status: DC
  Administered 2013-06-06 – 2013-06-08 (×2): 6 ug via INTRAVENOUS
  Filled 2013-06-05 (×2): qty 4

## 2013-06-05 MED ORDER — ASPIRIN 81 MG PO CHEW
81.0000 mg | CHEWABLE_TABLET | Freq: Every day | ORAL | Status: DC
  Administered 2013-06-05 – 2013-06-08 (×4): 81 mg via ORAL
  Filled 2013-06-05 (×4): qty 1

## 2013-06-05 MED ORDER — PREGABALIN 75 MG PO CAPS
150.0000 mg | ORAL_CAPSULE | Freq: Every day | ORAL | Status: DC
  Administered 2013-06-05 – 2013-06-07 (×3): 150 mg via ORAL
  Filled 2013-06-05 (×4): qty 2

## 2013-06-05 MED ORDER — SIMVASTATIN 20 MG PO TABS
20.0000 mg | ORAL_TABLET | Freq: Every day | ORAL | Status: DC
  Administered 2013-06-05 – 2013-06-07 (×3): 20 mg via ORAL
  Filled 2013-06-05 (×4): qty 1

## 2013-06-05 MED ORDER — SEVELAMER CARBONATE 800 MG PO TABS
2400.0000 mg | ORAL_TABLET | Freq: Three times a day (TID) | ORAL | Status: DC
  Administered 2013-06-05 – 2013-06-08 (×8): 2400 mg via ORAL
  Filled 2013-06-05 (×13): qty 3

## 2013-06-05 MED ORDER — PNEUMOCOCCAL VAC POLYVALENT 25 MCG/0.5ML IJ INJ
0.5000 mL | INJECTION | INTRAMUSCULAR | Status: AC
  Administered 2013-06-06: 0.5 mL via INTRAMUSCULAR
  Filled 2013-06-05: qty 0.5

## 2013-06-05 MED ORDER — PREGABALIN 75 MG PO CAPS
150.0000 mg | ORAL_CAPSULE | Freq: Every day | ORAL | Status: DC
  Administered 2013-06-05: 150 mg via ORAL

## 2013-06-05 MED ORDER — INSULIN ASPART 100 UNIT/ML ~~LOC~~ SOLN
0.0000 [IU] | Freq: Three times a day (TID) | SUBCUTANEOUS | Status: DC
  Administered 2013-06-05: 2 [IU] via SUBCUTANEOUS
  Administered 2013-06-05: 5 [IU] via SUBCUTANEOUS
  Administered 2013-06-05: 3 [IU] via SUBCUTANEOUS
  Administered 2013-06-06: 1 [IU] via SUBCUTANEOUS
  Administered 2013-06-06: 3 [IU] via SUBCUTANEOUS
  Administered 2013-06-07: 7 [IU] via SUBCUTANEOUS
  Administered 2013-06-07: 2 [IU] via SUBCUTANEOUS
  Administered 2013-06-07: 3 [IU] via SUBCUTANEOUS
  Administered 2013-06-08: 2 [IU] via SUBCUTANEOUS

## 2013-06-05 MED ORDER — OXYCODONE-ACETAMINOPHEN 5-325 MG PO TABS
1.0000 | ORAL_TABLET | Freq: Three times a day (TID) | ORAL | Status: DC | PRN
  Administered 2013-06-05 – 2013-06-08 (×6): 1 via ORAL
  Filled 2013-06-05 (×6): qty 1

## 2013-06-05 MED ORDER — DARBEPOETIN ALFA-POLYSORBATE 150 MCG/0.3ML IJ SOLN
150.0000 ug | INTRAMUSCULAR | Status: DC
  Filled 2013-06-05: qty 0.3

## 2013-06-05 MED ORDER — LEVOTHYROXINE SODIUM 125 MCG PO TABS
125.0000 ug | ORAL_TABLET | Freq: Every day | ORAL | Status: DC
  Administered 2013-06-05 – 2013-06-07 (×3): 125 ug via ORAL
  Filled 2013-06-05 (×5): qty 1

## 2013-06-05 MED ORDER — HEPARIN SODIUM (PORCINE) 5000 UNIT/ML IJ SOLN
5000.0000 [IU] | Freq: Three times a day (TID) | INTRAMUSCULAR | Status: DC
  Administered 2013-06-05 – 2013-06-08 (×10): 5000 [IU] via SUBCUTANEOUS
  Filled 2013-06-05 (×13): qty 1

## 2013-06-05 NOTE — H&P (Signed)
[facility] History and Physical  [name] [id number] DOB: 27-Dec-1960 DOA: 06/04/2013  Referring physician: ED PCP: Ricke Hey, MD  Chief Complaint: Fever  HPI: [name] is a 52 y.o. female with h/o ESRD, dialysis has apparently been switched to MWF (was just on dialysis today).  She presented to dialysis today for routine hemodialysis, had been feeling fine but during dialysis woke up with fever.  She was brought to the ED where a fever of 103.1 was recorded.  She was also tachycardic to 110 at the time.  Patient was put on emperic zosyn and vancomycin though the source of her fever remains unclear.  CXR and UA did not reveal the source.  Her dialysis access is a graft located in her LUE, the graft which is 52 years old has been giving her trouble over recent months and requiring numerous surgeries and stenting procedures, access has been painful for a number of months though it has never before associated with a fever or signs or symptoms of infection and she relates that access today was no more painful than any other day.  Cultures are pending, hospitalist has been asked to admit.  Review of Systems: 1 episode of n/v with fever today, 12 systems reviewed and otherwise negative.  Past Medical History  Diagnosis Date  . Diabetes mellitus   . Supraventricular tachycardia   . Renal disorder     Hemodialysis T, TH, Sat  . Thyroid disease   . Renal disorder   . Diabetes mellitus without complication   . Hypertension   . Thyroid disorder   . GERD (gastroesophageal reflux disease)   . Neuropathy   . Hypothyroidism   . Peripheral vascular disease   . Anemia   . Hemodialysis patient   . Hyperlipidemia   . SVT (supraventricular tachycardia)   . Sickle cell trait   . Gastroparesis due to DM    Past Surgical History  Procedure Laterality Date  . Abdominal hysterectomy    . Knee surgery      right  . Thyroid surgery    . Thyroidectomy    . Knee arthroplasty     . Cataract extraction, bilateral    . Colonoscopy  07/19/2012    Procedure: COLONOSCOPY;  Surgeon: Beryle Beams, MD;  Location: [facility];  Service: Endoscopy;  Laterality: N/A;  . Arteriovenous graft placement Left 2012    Dr. Harrington Challenger  2nd graft   Social History:  reports that she quit smoking about 19 years ago. Her smoking use included Cigarettes. She smoked 0.00 packs per day. She has never used smokeless tobacco. She reports that she uses illicit drugs (Marijuana). She reports that she does not drink alcohol.   No Known Allergies  Family History  Problem Relation Age of Onset  . Diabetes Mother   . Hypertension Mother   . Heart attack Mother   . Diabetes Father   . Hypertension Father     Prior to Admission medications   Medication Sig Start Date End Date Taking? Authorizing Provider  amLODipine (NORVASC) 10 MG tablet Take 10 mg by mouth daily.   Yes Historical Provider, MD  aspirin 81 MG chewable tablet Chew 81 mg by mouth daily.   Yes Historical Provider, MD  B Complex-C-Folic Acid (DIALYVITE PO) Take 1 tablet by mouth daily.   Yes Historical Provider, MD  cloNIDine (CATAPRES) 0.2 MG tablet Take 0.2 mg by mouth 2 (two) times daily. Takes twice daily.  On HD days (MWF), take first  dose after HD.   Yes Historical Provider, MD  esomeprazole (NEXIUM) 40 MG capsule Take 40 mg by mouth daily before breakfast.   Yes Historical Provider, MD  insulin lispro protamine-lispro (HUMALOG 75/25) (75-25) 100 UNIT/ML SUSP injection Inject 25 Units into the skin daily with breakfast.   Yes Historical Provider, MD  insulin lispro protamine-lispro (HUMALOG 75/25) (75-25) 100 UNIT/ML SUSP injection Inject 0-20 Units into the skin daily with supper. CBG < 84, zero unit CBG 85 - 150, 5 units CBG > 150, 20 units   Yes Historical Provider, MD  levothyroxine (SYNTHROID, LEVOTHROID) 125 MCG tablet Take 125 mcg by mouth daily before breakfast.    Yes Historical Provider, MD  metoprolol (LOPRESSOR) 100  MG tablet Take 100 mg by mouth at bedtime. Tartrate once daily   Yes Historical Provider, MD  multivitamin (RENA-VIT) TABS tablet Take 1 tablet by mouth at bedtime.    Yes Historical Provider, MD  oxyCODONE-acetaminophen (PERCOCET/ROXICET) 5-325 MG per tablet Take 1 tablet by mouth every 8 (eight) hours as needed for pain.    Yes Historical Provider, MD  pravastatin (PRAVACHOL) 40 MG tablet Take 40 mg by mouth at bedtime.    Yes Historical Provider, MD  pregabalin (LYRICA) 150 MG capsule Take 150 mg by mouth at bedtime.   Yes Historical Provider, MD  promethazine (PHENERGAN) 25 MG tablet Take 25 mg by mouth every 8 (eight) hours as needed for nausea.   Yes Historical Provider, MD  sevelamer carbonate (RENVELA) 800 MG tablet Take 2,400 mg by mouth 3 (three) times daily with meals.   Yes Historical Provider, MD  sevelamer carbonate (RENVELA) 800 MG tablet Take 1,600 mg by mouth as needed (take with snack).   Yes Historical Provider, MD  simvastatin (ZOCOR) 20 MG tablet Take 20 mg by mouth every evening.   Yes Historical Provider, MD   Physical Exam: Filed Vitals:   06/04/13 2207  BP: 142/74  Pulse: 83  Temp: 99.3 F (37.4 C)  Resp: 20     General:  NAD, resting comfortably in bed  Eyes: PEERLA EOMI  ENT: mucous membranes moist  Neck: supple w/o JVD  Cardiovascular: RRR w/o MRG  Respiratory: CTA B  Abdomen: soft, nt, nd, bs+  Skin: no rash nor lesion  Musculoskeletal: MAE, full ROM all 4 extremities  Psychiatric: normal tone and affect  Neurologic: AAOx3, grossly non-focal   Labs on Admission:  Basic Metabolic Panel:  Recent Labs Lab 06/04/13 1725  NA 136  K 4.8  CL 93*  CO2 24  GLUCOSE 249*  BUN 15  CREATININE 5.09*  CALCIUM 9.0   Liver Function Tests:  Recent Labs Lab 06/04/13 1725  AST 27  ALT 14  ALKPHOS 245*  BILITOT 0.4  PROT 8.2  ALBUMIN 3.8   No results found for this basename: LIPASE, AMYLASE,  in the last 168 hours No results found for  this basename: AMMONIA,  in the last 168 hours CBC:  Recent Labs Lab 06/04/13 1725  WBC 7.8  HGB 11.3*  HCT 35.1*  MCV 75.5*  PLT 127*   Cardiac Enzymes:  Recent Labs Lab 06/04/13 1725  TROPONINI <0.30    BNP (last 3 results) No results found for this basename: PROBNP,  in the last 8760 hours CBG:  Recent Labs Lab 06/04/13 2211  GLUCAP 282*    Radiological Exams on Admission: Dg Chest Portable 1 View  06/04/2013   CLINICAL DATA:  Sepsis.  EXAM: PORTABLE CHEST - 1 VIEW  COMPARISON:  No priors.  FINDINGS: Lung volumes are normal. No consolidative airspace disease. No pleural effusions. No pneumothorax. No pulmonary nodule or mass noted. Pulmonary vasculature and the cardiomediastinal silhouette are within normal limits.  IMPRESSION: 1.  No radiographic evidence of acute cardiopulmonary disease.   Electronically Signed   By: Vinnie Langton M.D.   On: 06/04/2013 19:28    EKG: Independently reviewed.  Assessment/Plan Principal Problem:   Sepsis Active Problems:   DM (diabetes mellitus)   Mechanical complication of other vascular device, implant, and graft   End stage renal disease   1. Sepsis - fever of unclear source, am somewhat suspicious of the AVG as it is 52 years old, s/p numerous surgical procedures so far just this year, and the fact that the patient spiked fever right after it was accessed today.  On empiric ABx, cultures obtained before ABx given in ED, ultrasound of AVG to look for abscess or other fluid collection ordered.  Alternatively if the graft is not infected this could just represent a case of food poisoning with the N/V being the only symptom she had today, but need to rule out graft infection. 2. DM - holding home insulin and putting patient on sensitive scale SSI 3. ESRD - dialysis M/W/F, last dialysis today, consult nephrology tomorrow for Wed dialysis.    Code Status: Full Code (must indicate code status--if unknown or must be presumed,  indicate so) Family Communication: No family in room (indicate person spoken with, if applicable, with phone number if by telephone) Disposition Plan: Admit to inpatient (indicate anticipated LOS)  Time spent: 70 min  GARDNER, JARED M. [facility] Pager [phone]  If 7PM-7AM, please contact night-coverage [email or web address] Password [facility] 06/05/2013, 12:24 AM

## 2013-06-05 NOTE — Progress Notes (Signed)
[facility] PROGRESS NOTE  [name] [id number] DOB: 02-Mar-1961 DOA: 06/04/2013 PCP: Ricke Hey, MD  Assessment/Plan:  Fever/Sepsis: - suspect blood stream infections-awaiting blood cultures - CXR and UA negative.  Blood cultures and urine culture pending. - continue with IV Vanc and Zosyn   ESRD: - Hemodialysis T, Th, Sat - Has had trouble with graft site over last 2 months - Followed by [facility] (Dr. Lucky Cowboy) - Was to have an appointment tomorrow 06/06/13 to discuss for possible replacement  Diabetes Mellitus: - uncontrolled - Continue with SSI -Start home regimen of insulin-but at half dose  Hypertension: - Stable - Continue with amlodipine, metoprolol  Hypothyroidism: - s/p thyroidectomy - Stable - Continue with levothyroxine  Hx of SVT: - tachycardic on exam; Rate 125 bpm-but sinus tachycardia - on telemetry- shows sinus tach - continue with metoprolol and clonidine  Peripheral Vascular Disease with Peripheral Neuropathy: - Left arm affected (graft side) - Stable - Continue with pregabalin  GERD: - Stable - Continue with pantoprazole   Hyperlipidemia: - Stable - Continue with simvastatin  Code Status: Full Family Communication: None at bedside  Disposition Plan: Remain inpatient   Consultants:  Nephrology  Procedures:  None  Antibiotics:  IV Vancomycin 1000mg /280mL began 06/04/13   IV Zosyn 3.375g given once 06/04/13  IV Zosyn 2.25g q 8 hrs began 06/05/13  HPI/Subjective: Doing well today.  Temp has stabilized with IV antibiotics.  Denies any H/A, blurred vision, cough, SOB, chest pain, N/V, abdominal pain, bowel changes, urinary frequency, dysuria.  Did mention a 50-60 pound weight gain over the last 6 months that she does not know what may have caused it.   Objective: Filed Vitals:   06/05/13 1000  BP: 159/77  Pulse: 77  Temp: 99 F (37.2 C)  Resp: 20    Intake/Output Summary (Last 24 hours) at  06/05/13 1322 Last data filed at 06/05/13 0900  Gross per 24 hour  Intake    240 ml  Output      0 ml  Net    240 ml   Filed Weights   06/04/13 1734 06/04/13 2207  Weight: 97.1 kg (214 lb 1.1 oz) 114.6 kg (252 lb 10.4 oz)    Exam:   General:  AAF in no apparent distress, resting comfortably in bed.  Alert and oriented.  Responds appropriately.  Cardiovascular: Tachycardic, regular rhythm.  3/6 systolic murmur heard best over aortic area.    Respiratory: Clear to auscultation bilaterally; no rhonchi, rales, or wheezes  Abdomen: Large abdomen, non-tender, no masses, no scars; bowel sounds heard in all 4 quadrants  Musculoskeletal: Full ROM, 1+ pitting edema bilaterally in lower legs, distal pulses intact and symmetric.  Neurologic: Decreased sensation in left hand.  Sensations felt symmetrically in feet.     Data Reviewed: Basic Metabolic Panel:  Recent Labs Lab 06/04/13 1725 06/05/13 0450  NA 136 134*  K 4.8 4.8  CL 93* 93*  CO2 24 29  GLUCOSE 249* 225*  BUN 15 24*  CREATININE 5.09* 7.02*  CALCIUM 9.0 8.3*   Liver Function Tests:  Recent Labs Lab 06/04/13 1725  AST 27  ALT 14  ALKPHOS 245*  BILITOT 0.4  PROT 8.2  ALBUMIN 3.8   CBC:  Recent Labs Lab 06/04/13 1725 06/05/13 0450  WBC 7.8 4.5  HGB 11.3* 9.4*  HCT 35.1* 29.4*  MCV 75.5* 74.2*  PLT 127* 108*   Cardiac Enzymes:  Recent Labs Lab 06/04/13 1725  TROPONINI <0.30  CBG:  Recent Labs Lab 06/04/13 2211 06/05/13 0038 06/05/13 0814 06/05/13 1208  GLUCAP 282* 275* 198* 259*    Studies: Dg Chest Portable 1 View  06/04/2013   CLINICAL DATA:  Sepsis.  EXAM: PORTABLE CHEST - 1 VIEW  COMPARISON:  No priors.  FINDINGS: Lung volumes are normal. No consolidative airspace disease. No pleural effusions. No pneumothorax. No pulmonary nodule or mass noted. Pulmonary vasculature and the cardiomediastinal silhouette are within normal limits.  IMPRESSION: 1.  No radiographic evidence of acute  cardiopulmonary disease.   Electronically Signed   By: Vinnie Langton M.D.   On: 06/04/2013 19:28    Scheduled Meds: . amLODipine  10 mg Oral Daily  . aspirin  81 mg Oral Daily  . cloNIDine  0.2 mg Oral BID  . [START ON 06/06/2013] darbepoetin (ARANESP) injection - DIALYSIS  150 mcg Intravenous Q Wed-HD  . [START ON 06/06/2013] doxercalciferol  6 mcg Intravenous Q M,W,F-HD  . [START ON 06/06/2013] ferric gluconate (FERRLECIT/NULECIT) IV  62.5 mg Intravenous Q Wed-HD  . heparin  5,000 Units Subcutaneous Q8H  . insulin aspart  0-9 Units Subcutaneous TID WC  . levothyroxine  125 mcg Oral QAC breakfast  . metoprolol  100 mg Oral QHS  . multivitamin  1 tablet Oral QHS  . ondansetron (ZOFRAN) IV  4 mg Intravenous Once  . pantoprazole  80 mg Oral Q1200  . piperacillin-tazobactam (ZOSYN)  IV  2.25 g Intravenous Q8H  . pregabalin  150 mg Oral QHS  . sevelamer carbonate  2,400 mg Oral TID WC  . simvastatin  20 mg Oral q1800  . sodium chloride  3 mL Intravenous Q12H  . [START ON 06/06/2013] vancomycin  1,000 mg Intravenous Q M,W,F-HD    Principal Problem:   Sepsis Active Problems:   DM (diabetes mellitus)   Mechanical complication of other vascular device, implant, and graft   End stage renal disease    Fenton Malling PA-S  [facility] Pager [phone]. If 7PM-7AM, please contact night-coverage at [email or web address], password [facility] 06/05/2013, 1:22 PM  LOS: 1 day   Attending Patient seen and examined, agree with the assessment and plan as outlined above. ESRD pt having issues with AV Graft-coming in with fever. UA/CXR neg for UTI/PNA.Currrent suspicion ofr AV Graft related issue and possible bacteremia. Continue with Vanco/Zosyn-await Blood cultures   Nena Alexander MD

## 2013-06-05 NOTE — ED Provider Notes (Signed)
Medical screening examination/treatment/procedure(s) were performed by non-physician practitioner and as supervising physician I was immediately available for consultation/collaboration.  Richarda Blade, MD 06/05/13 [phone]

## 2013-06-05 NOTE — Progress Notes (Signed)
Pt's temperature rechecked at 18:15, temp now 103. Pt still having vigorous chills from head to toe. MD notified. Per MD, allow the tylenol more time to work, recheck temp in another hour. Will continue to monitor.

## 2013-06-05 NOTE — Progress Notes (Signed)
Pt woke from a nap to another episode of chills, and fever of 101.2. Pt's right forearm IV infiltrated. Pt's right hand IV still functional. No complaints of pain to right forearm at infiltration site. Ice pack applied. MD notified of chills. Orders received for tylenol and blood cultures. 650mg  Tylenol given PO. Pt responding well to the tylenol, feels like her chills are subsiding. Will plan to recheck temperature within the next hour. Will continue to monitor.

## 2013-06-05 NOTE — Consult Note (Signed)
[facility] Renal Consultation Note  Indication for Consultation:  Management of ESRD/hemodialysis; anemia, hypertension/volume and secondary hyperparathyroidism  HPI: [name] is a 52 y.o. female with ESRD on dialysis at the [facility] who yesterday arrived for dialysis and had onset of chills, rigors, and malaise which worsened during her treatment, after which she had fever of 100.7, elevated blood pressure and heart rate and was sent to the ED for evaluation.  In the ED her temperature was as high as 103.1, and Vancomycin and Zosyn were started empirically.  Chest x-ray showed no evidence of acute disease, and urinalysis was negative for infection.  Blood cultures are pending.  Her left upper arm graft has posed problems with cannulation and intermittent pain for several months, and she has had numerous interventions, most recently an arterial Duplex by Dr. Bridgett Larsson of [facility] on 8/1, at which time an Arch aortogram with left subclavian artery angiogram and possible intervention were recommended.  The most recent access flow was 1367.  An ultrasound was ordered to check for fluid collection or abscess in the area of her access.  She denied any nausea, vomiting, or diarrhea and currently is feeling much better without any complaints.  Dialysis Orders:  MWF @ [place] 4:30   92 kg   450/A1.5   2K/2.25Ca   Heparin 9000 U   Profile 4 Hectorol 2 mcg    Epogen 13,000 U    Venofer  50 mg on Weds   Past Medical History  Diagnosis Date  . Diabetes mellitus   . Supraventricular tachycardia   . Renal disorder     Hemodialysis T, TH, Sat  . Thyroid disease   . Renal disorder   . Diabetes mellitus without complication   . Hypertension   . Thyroid disorder   . GERD (gastroesophageal reflux disease)   . Neuropathy   . Hypothyroidism   . Peripheral vascular disease   . Anemia   . Hemodialysis patient   . Hyperlipidemia   . SVT (supraventricular tachycardia)   . Sickle  cell trait   . Gastroparesis due to DM    Past Surgical History  Procedure Laterality Date  . Abdominal hysterectomy    . Knee surgery      right  . Thyroid surgery    . Thyroidectomy    . Knee arthroplasty    . Cataract extraction, bilateral    . Colonoscopy  07/19/2012    Procedure: COLONOSCOPY;  Surgeon: Beryle Beams, MD;  Location: [place];  Service: Endoscopy;  Laterality: N/A;  . Arteriovenous graft placement Left 2012    Dr. Harrington Challenger  2nd graft   Family History  Problem Relation Age of Onset  . Diabetes Mother   . Hypertension Mother   . Heart attack Mother   . Diabetes Father   . Hypertension Father    Social History She quit smoking cigarettes 20 years ago after using less than one pack a day. She denies any use of alcohol or illicit drug use.  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  No Known Allergies Prior to Admission medications   Medication Sig Start Date End Date Taking? Authorizing Provider  amLODipine (NORVASC) 10 MG tablet Take 10 mg by mouth daily.   Yes Historical Provider, MD  aspirin 81 MG chewable tablet Chew 81 mg by mouth daily.   Yes Historical Provider, MD  B  Complex-C-Folic Acid (DIALYVITE PO) Take 1 tablet by mouth daily.   Yes Historical Provider, MD  cloNIDine (CATAPRES) 0.2 MG tablet Take 0.2 mg by mouth 2 (two) times daily. Takes twice daily.  On HD days (MWF), take first dose after HD.   Yes Historical Provider, MD  esomeprazole (NEXIUM) 40 MG capsule Take 40 mg by mouth daily before breakfast.   Yes Historical Provider, MD  insulin lispro protamine-lispro (HUMALOG 75/25) (75-25) 100 UNIT/ML SUSP injection Inject 25 Units into the skin daily with breakfast.   Yes Historical Provider, MD  insulin lispro protamine-lispro (HUMALOG 75/25) (75-25) 100 UNIT/ML SUSP injection Inject 0-20 Units into the skin daily with supper. CBG < 84, zero  unit CBG 85 - 150, 5 units CBG > 150, 20 units   Yes Historical Provider, MD  levothyroxine (SYNTHROID, LEVOTHROID) 125 MCG tablet Take 125 mcg by mouth daily before breakfast.    Yes Historical Provider, MD  metoprolol (LOPRESSOR) 100 MG tablet Take 100 mg by mouth at bedtime. Tartrate once daily   Yes Historical Provider, MD  multivitamin (RENA-VIT) TABS tablet Take 1 tablet by mouth at bedtime.    Yes Historical Provider, MD  oxyCODONE-acetaminophen (PERCOCET/ROXICET) 5-325 MG per tablet Take 1 tablet by mouth every 8 (eight) hours as needed for pain.    Yes Historical Provider, MD  pravastatin (PRAVACHOL) 40 MG tablet Take 40 mg by mouth at bedtime.    Yes Historical Provider, MD  pregabalin (LYRICA) 150 MG capsule Take 150 mg by mouth at bedtime.   Yes Historical Provider, MD  promethazine (PHENERGAN) 25 MG tablet Take 25 mg by mouth every 8 (eight) hours as needed for nausea.   Yes Historical Provider, MD  sevelamer carbonate (RENVELA) 800 MG tablet Take 2,400 mg by mouth 3 (three) times daily with meals.   Yes Historical Provider, MD  sevelamer carbonate (RENVELA) 800 MG tablet Take 1,600 mg by mouth as needed (take with snack).   Yes Historical Provider, MD  simvastatin (ZOCOR) 20 MG tablet Take 20 mg by mouth every evening.   Yes Historical Provider, MD  ROS: Constitutional: negative for chills, fatigue, fevers and sweats Ears, nose, mouth, throat, and face: negative for earaches, hoarseness, nasal congestion and sore throat Respiratory: negative for cough, dyspnea on exertion, hemoptysis and sputum Cardiovascular: negative for chest pain, chest pressure/discomfort, dyspnea, orthopnea and palpitations Gastrointestinal: negative for abdominal pain, change in bowel habits, nausea and vomiting Genitourinary:negative, oliguric Musculoskeletal:negative for arthralgias, back pain, myalgias and neck pain Neurological: negative for dizziness, gait problems, headaches and weakness  Physical  Exam: Filed Vitals:   06/05/13 0530  BP: 149/73  Pulse: 72  Temp: 98.9 F (37.2 C)  Resp: 20     General appearance: alert, cooperative and no distress Head: Normocephalic, without obvious abnormality, atraumatic Neck: no adenopathy, no carotid bruit, no JVD and supple, symmetrical, trachea midline Resp: clear to auscultation bilaterally Cardio: RRR with Gr II/VI systolic murmur, no rub GI: soft, non-tender; bowel sounds normal; no masses,  no organomegaly Extremities: extremities normal, atraumatic, no cyanosis or edema Neurologic: Grossly normal Dialysis Access: AVG @ LUA with + bruit No obvious areas of fluctuance, drainage or tenderness of the graft   Labs:  Results for orders placed during the hospital encounter of 06/04/13 (from the past 48 hour(s))  CBC     Status: Abnormal   Collection Time    06/04/13  5:25 PM      Result Value Range   WBC 7.8  4.0 -  10.5 K/uL   Comment: WHITE COUNT CONFIRMED ON SMEAR   RBC 4.65  3.87 - 5.11 MIL/uL   Hemoglobin 11.3 (*) 12.0 - 15.0 g/dL   HCT 35.1 (*) 36.0 - 46.0 %   MCV 75.5 (*) 78.0 - 100.0 fL   MCH 24.3 (*) 26.0 - 34.0 pg   MCHC 32.2  30.0 - 36.0 g/dL   RDW 18.2 (*) 11.5 - 15.5 %   Platelets 127 (*) 150 - 400 K/uL   Comment: PLATELET COUNT CONFIRMED BY SMEAR     LARGE PLATELETS PRESENT     REPEATED TO VERIFY  COMPREHENSIVE METABOLIC PANEL     Status: Abnormal   Collection Time    06/04/13  5:25 PM      Result Value Range   Sodium 136  135 - 145 mEq/L   Potassium 4.8  3.5 - 5.1 mEq/L   Chloride 93 (*) 96 - 112 mEq/L   CO2 24  19 - 32 mEq/L   Glucose, Bld 249 (*) 70 - 99 mg/dL   BUN 15  6 - 23 mg/dL   Creatinine, Ser 5.09 (*) 0.50 - 1.10 mg/dL   Calcium 9.0  8.4 - 10.5 mg/dL   Total Protein 8.2  6.0 - 8.3 g/dL   Albumin 3.8  3.5 - 5.2 g/dL   AST 27  0 - 37 U/L   ALT 14  0 - 35 U/L   Alkaline Phosphatase 245 (*) 39 - 117 U/L   Total Bilirubin 0.4  0.3 - 1.2 mg/dL   GFR calc non Af Amer 9 (*) >90 mL/min   GFR calc Af  Amer 10 (*) >90 mL/min   Comment: (NOTE)     The eGFR has been calculated using the CKD EPI equation.     This calculation has not been validated in all clinical situations.     eGFR's persistently <90 mL/min signify possible Chronic Kidney     Disease.  LACTIC ACID, PLASMA     Status: Abnormal   Collection Time    06/04/13  5:25 PM      Result Value Range   Lactic Acid, Venous 2.7 (*) 0.5 - 2.2 mmol/L  CORTISOL     Status: None   Collection Time    06/04/13  5:25 PM      Result Value Range   Cortisol, Plasma 30.4     Comment: (NOTE)     AM:  4.3 - 22.4 ug/dL     PM:  3.1 - 16.7 ug/dL     Performed at [facility]  TROPONIN I     Status: None   Collection Time    06/04/13  5:25 PM      Result Value Range   Troponin I <0.30  <0.30 ng/mL   Comment:            Due to the release kinetics of cTnI,     a negative result within the first hours     of the onset of symptoms does not rule out     myocardial infarction with certainty.     If myocardial infarction is still suspected,     repeat the test at appropriate intervals.  PROTIME-INR     Status: None   Collection Time    06/04/13  5:25 PM      Result Value Range   Prothrombin Time 12.7  11.6 - 15.2 seconds   INR 0.97  0.00 - 1.49  APTT  Status: None   Collection Time    06/04/13  5:25 PM      Result Value Range   aPTT 24  24 - 37 seconds  FIBRINOGEN     Status: None   Collection Time    06/04/13  5:25 PM      Result Value Range   Fibrinogen 418  204 - 475 mg/dL  TYPE AND SCREEN     Status: None   Collection Time    06/04/13  5:50 PM      Result Value Range   ABO/RH(D) A POS     Antibody Screen NEG     Sample Expiration 06/07/2013    URINALYSIS, ROUTINE W REFLEX MICROSCOPIC     Status: Abnormal   Collection Time    06/04/13  7:24 PM      Result Value Range   Color, Urine YELLOW  YELLOW   APPearance CLEAR  CLEAR   Specific Gravity, Urine 1.015  1.005 - 1.030   pH 8.0  5.0 - 8.0   Glucose, UA  NEGATIVE  NEGATIVE mg/dL   Hgb urine dipstick NEGATIVE  NEGATIVE   Bilirubin Urine NEGATIVE  NEGATIVE   Ketones, ur 15 (*) NEGATIVE mg/dL   Protein, ur 100 (*) NEGATIVE mg/dL   Urobilinogen, UA 0.2  0.0 - 1.0 mg/dL   Nitrite NEGATIVE  NEGATIVE   Leukocytes, UA NEGATIVE  NEGATIVE  URINE MICROSCOPIC-ADD ON     Status: Abnormal   Collection Time    06/04/13  7:24 PM      Result Value Range   Squamous Epithelial / LPF FEW (*) RARE   WBC, UA 0-2  <3 WBC/hpf   Casts HYALINE CASTS (*) NEGATIVE  GLUCOSE, CAPILLARY     Status: Abnormal   Collection Time    06/04/13 10:11 PM      Result Value Range   Glucose-Capillary 282 (*) 70 - 99 mg/dL  GLUCOSE, CAPILLARY     Status: Abnormal   Collection Time    06/05/13 12:38 AM      Result Value Range   Glucose-Capillary 275 (*) 70 - 99 mg/dL  CBC     Status: Abnormal   Collection Time    06/05/13  4:50 AM      Result Value Range   WBC 4.5  4.0 - 10.5 K/uL   RBC 3.96  3.87 - 5.11 MIL/uL   Hemoglobin 9.4 (*) 12.0 - 15.0 g/dL   Comment: DELTA CHECK NOTED     REPEATED TO VERIFY   HCT 29.4 (*) 36.0 - 46.0 %   MCV 74.2 (*) 78.0 - 100.0 fL   MCH 23.7 (*) 26.0 - 34.0 pg   MCHC 32.0  30.0 - 36.0 g/dL   RDW 17.8 (*) 11.5 - 15.5 %   Platelets 108 (*) 150 - 400 K/uL   Comment: PLATELET COUNT CONFIRMED BY SMEAR     REPEATED TO VERIFY  BASIC METABOLIC PANEL     Status: Abnormal   Collection Time    06/05/13  4:50 AM      Result Value Range   Sodium 134 (*) 135 - 145 mEq/L   Potassium 4.8  3.5 - 5.1 mEq/L   Chloride 93 (*) 96 - 112 mEq/L   CO2 29  19 - 32 mEq/L   Glucose, Bld 225 (*) 70 - 99 mg/dL   BUN 24 (*) 6 - 23 mg/dL   Creatinine, Ser 7.02 (*) 0.50 - 1.10 mg/dL   Calcium 8.3 (*)  8.4 - 10.5 mg/dL   GFR calc non Af Amer 6 (*) >90 mL/min   GFR calc Af Amer 7 (*) >90 mL/min   Comment: (NOTE)     The eGFR has been calculated using the CKD EPI equation.     This calculation has not been validated in all clinical situations.     eGFR's  persistently <90 mL/min signify possible Chronic Kidney     Disease.  GLUCOSE, CAPILLARY     Status: Abnormal   Collection Time    06/05/13  8:14 AM      Result Value Range   Glucose-Capillary 198 (*) 70 - 99 mg/dL   Assessment/Plan: 1. Fever - sudden onset yesterday, as high as 103.1, resolved on Vancomycin & Zosyn, but no clear source; CXR, UA negative; BCs pending, [place] to evaluate AVG. If recurrent temp spikes during dialysis tomorrow would raise suspicion of AVG related problem. 2. ESRD - HD on MWF @ [place]; K 4.8 today; Txs recently shortened sec to clotting dialyzers, Heparin increased.  Next HD tomorrow. 3. Hypertension/volume - BP 159/77 most recently, on Clonidine 0.2 mg bid, Amlodipine 10 mg qd, Metoprolol 100 mg qhs; CXR negative for edema, not reaching EDW recently (usually up by 0.5 L). 4. Anemia - Hgb down to 9.4, on outpatient Epogen 13,00 U; last T-sat 26% (8/20), Venofer 50 mg qwk.  Check CBC and give Aranesp tomorrow. 5. Metabolic bone disease - Ca 8.3, last P 2.9, iPTH 332; Hectorol 2 mcg, Renvela 4 with meals. 6. Nutrition - Last Alb 4, renal diet, vitamin,. 7. Dialysis access - frequent pain at site, difficult cannulations; followed by Dr. Lucky Cowboy in [place], but by [facility] most recently on 8/1 for L arm Duplex. 8. DM Ype 2 - on insulin. 9. Hypothyroidism - on Synthroid.  LYLES,CHARLES 06/05/2013, 10:59 AM   Attending Nephrologist: Jamal Maes. MD  I have seen and examined this patient and agree with plan as above with highlighted additions.  Defervescing with empiric vancomycin and zosyn.  No apparent source, but worrisome that the febrile episode occurred during dialysis. Plan for dialysis tomorrow per usual schedule. Continue antibiotics.    Javis Abboud B,MD 06/05/2013 4:00 PM

## 2013-06-05 NOTE — Progress Notes (Signed)
Inpatient Diabetes Program Recommendations  AACE/ADA: New Consensus Statement on Inpatient Glycemic Control (2013)  Target Ranges:  Prepandial:   less than 140 mg/dL      Peak postprandial:   less than 180 mg/dL (1-2 hours)      Critically ill patients:  140 - 180 mg/dL     Results for JANI, PRAY (MRN [id number]) as of 06/05/2013 13:36  Ref. Range 06/05/2013 00:38 06/05/2013 08:14 06/05/2013 12:08  Glucose-Capillary Latest Range: 70-99 mg/dL 275 (H) 198 (H) 259 (H)    Per records, patient takes the following insulin at home: Humalog 75/25 insulin- 25 units in the AM with breakfast                    5 units in the PM with supper (if CBG 85-150 mg/dl)                   20 units in the PM with supper (if CBG >150 mg/dl)    MD- Please consider starting at least 1/2 patient's home insulin 70/30 insulin- 12 units in the AM with breakfast and 10 units in the PM with supper   Will follow. Wyn Quaker RN, MSN, CDE Diabetes Coordinator Inpatient Diabetes Program Team Pager: [phone] (8a-10p)

## 2013-06-06 DIAGNOSIS — Z48812 Encounter for surgical aftercare following surgery on the circulatory system: Secondary | ICD-10-CM

## 2013-06-06 DIAGNOSIS — I498 Other specified cardiac arrhythmias: Secondary | ICD-10-CM

## 2013-06-06 DIAGNOSIS — I10 Essential (primary) hypertension: Secondary | ICD-10-CM

## 2013-06-06 DIAGNOSIS — R509 Fever, unspecified: Secondary | ICD-10-CM

## 2013-06-06 DIAGNOSIS — E079 Disorder of thyroid, unspecified: Secondary | ICD-10-CM

## 2013-06-06 LAB — CBC
HCT: 26.7 % — ABNORMAL LOW (ref 36.0–46.0)
Hemoglobin: 8.5 g/dL — ABNORMAL LOW (ref 12.0–15.0)
MCH: 23.6 pg — ABNORMAL LOW (ref 26.0–34.0)
MCV: 74.2 fL — ABNORMAL LOW (ref 78.0–100.0)
RBC: 3.6 MIL/uL — ABNORMAL LOW (ref 3.87–5.11)

## 2013-06-06 LAB — RENAL FUNCTION PANEL
Albumin: 2.9 g/dL — ABNORMAL LOW (ref 3.5–5.2)
BUN: 39 mg/dL — ABNORMAL HIGH (ref 6–23)
Creatinine, Ser: 9.44 mg/dL — ABNORMAL HIGH (ref 0.50–1.10)
GFR calc non Af Amer: 4 mL/min — ABNORMAL LOW (ref >90)
Phosphorus: 2.7 mg/dL (ref 2.3–4.6)
Potassium: 4.2 mEq/L (ref 3.5–5.1)

## 2013-06-06 MED ORDER — HEPARIN SODIUM (PORCINE) 1000 UNIT/ML DIALYSIS
9000.0000 [IU] | Freq: Once | INTRAMUSCULAR | Status: AC
  Administered 2013-06-06: 9000 [IU] via INTRAVENOUS_CENTRAL

## 2013-06-06 MED ORDER — LIDOCAINE-PRILOCAINE 2.5-2.5 % EX CREA: 1.0000 "application " | TOPICAL_CREAM | CUTANEOUS | Status: DC | PRN

## 2013-06-06 MED ORDER — HEPARIN SODIUM (PORCINE) 1000 UNIT/ML DIALYSIS: 1000.0000 [IU] | INTRAMUSCULAR | Status: DC | PRN

## 2013-06-06 MED ORDER — SODIUM CHLORIDE 0.9 % IV SOLN: 100.0000 mL | INTRAVENOUS | Status: DC | PRN

## 2013-06-06 MED ORDER — PENTAFLUOROPROP-TETRAFLUOROETH EX AERO: 1.0000 "application " | INHALATION_SPRAY | CUTANEOUS | Status: DC | PRN

## 2013-06-06 MED ORDER — DARBEPOETIN ALFA-POLYSORBATE 200 MCG/0.4ML IJ SOLN
200.0000 ug | INTRAMUSCULAR | Status: DC
  Administered 2013-06-06: 200 ug via INTRAVENOUS

## 2013-06-06 MED ORDER — INSULIN ASPART 100 UNIT/ML ~~LOC~~ SOLN
3.0000 [IU] | Freq: Once | SUBCUTANEOUS | Status: AC
  Administered 2013-06-06: 3 [IU] via SUBCUTANEOUS

## 2013-06-06 MED ORDER — NEPRO/CARBSTEADY PO LIQD
237.0000 mL | ORAL | Status: DC | PRN
  Filled 2013-06-06: qty 237

## 2013-06-06 MED ORDER — DARBEPOETIN ALFA-POLYSORBATE 200 MCG/0.4ML IJ SOLN
INTRAMUSCULAR | Status: AC
  Filled 2013-06-06: qty 0.4

## 2013-06-06 MED ORDER — ACETAMINOPHEN 325 MG PO TABS
ORAL_TABLET | ORAL | Status: AC
  Administered 2013-06-06: 650 mg via ORAL
  Filled 2013-06-06: qty 2

## 2013-06-06 MED ORDER — LIDOCAINE HCL (PF) 1 % IJ SOLN: 5.0000 mL | INTRAMUSCULAR | Status: DC | PRN

## 2013-06-06 MED ORDER — DOXERCALCIFEROL 4 MCG/2ML IV SOLN
INTRAVENOUS | Status: AC
  Filled 2013-06-06: qty 4

## 2013-06-06 MED ORDER — DARBEPOETIN ALFA-POLYSORBATE 100 MCG/0.5ML IJ SOLN
INTRAMUSCULAR | Status: AC
  Filled 2013-06-06: qty 0.5

## 2013-06-06 MED ORDER — ALTEPLASE 2 MG IJ SOLR
2.0000 mg | Freq: Once | INTRAMUSCULAR | Status: DC | PRN
  Filled 2013-06-06: qty 2

## 2013-06-06 NOTE — Procedures (Signed)
I have personally attended this patient's dialysis session.  Access is currently being cannulated.  Pt afebrile at present (had recurrence of high fever yesterday evening) Labs all pending    Shaelynn Dragos B

## 2013-06-06 NOTE — Progress Notes (Signed)
[facility] PROGRESS NOTE  [name] [id number] DOB: 12-21-1960 DOA: 06/04/2013 PCP: Ricke Hey, MD  Assessment/Plan:  Fever/Sepsis: - suspect blood stream infections-awaiting blood cultures - CXR and UA negative.  Blood cultures NTD and urine culture shows no growth - continue with IV Vanc and Zosyn  - Lactic acid initially 2.7  ESRD: - Hemodialysis T, Th, Sat - Has had trouble with graft site over last 2 months - Nephrology on board and following. - Followed by [facility] (Dr. Lucky Cowboy) - Was to have an appointment tomorrow 06/06/13 to discuss for possible replacement  Diabetes Mellitus: - uncontrolled most likely due to infectious etiology - Continue with SSI - Continue current insulin regimen and continue to monitor CBG's. Will adjust pending further values.  Hypertension: - Stable, relatively well controlled with last BP 132/73 - Continue with amlodipine, metoprolol  Hypothyroidism: - s/p thyroidectomy - Stable - Continue with levothyroxine  Hx of SVT: - No reports to me of SVT while here. Has been sinus reportedly. - on telemetry- showed sinus tach. Currently tachycardia resolved. - continue with metoprolol and clonidine  Peripheral Vascular Disease with Peripheral Neuropathy: - Left arm affected (graft side) - Stable - Continue with pregabalin  GERD: - Stable - Continue with pantoprazole   Hyperlipidemia: - Stable - Continue with simvastatin  Code Status: Full Family Communication: None at bedside  Disposition Plan: Remain inpatient   Consultants:  Nephrology  Procedures:  None  Antibiotics:  IV Vancomycin 1000mg /255mL began 06/04/13   IV Zosyn 3.375g given once 06/04/13  IV Zosyn 2.25g q 8 hrs began 06/05/13  HPI/Subjective: No new complaints. No acute issues reported overnight to me.  Objective: Filed Vitals:   06/06/13 1350  BP: 132/73  Pulse: 80  Temp: 98.3 F (36.8 C)  Resp: 16     Intake/Output Summary (Last 24 hours) at 06/06/13 1659 Last data filed at 06/06/13 1256  Gross per 24 hour  Intake    360 ml  Output   2985 ml  Net  -2625 ml   Filed Weights   06/04/13 2207 06/05/13 2116 06/06/13 0725  Weight: 114.6 kg (252 lb 10.4 oz) 96.843 kg (213 lb 8 oz) 96.4 kg (212 lb 8.4 oz)    Exam:   General:  AAF in no apparent distress, laying supine in bed.  Alert and oriented.  Responds appropriately.  Cardiovascular: , RRR  3/6 systolic murmur heard best over aortic area.    Respiratory: Clear to auscultation bilaterally; no rhonchi, rales, or wheezes  Abdomen: Large abdomen, non-tender, no masses, + BS  Musculoskeletal: 1+ pitting edema bilaterally in lower legs, distal pulses intact and symmetric.  Neurologic: answers questions appropriately, moves all extremities equally   Data Reviewed: Basic Metabolic Panel:  Recent Labs Lab 06/04/13 1725 06/05/13 0450 06/06/13 0828  NA 136 134* 128*  K 4.8 4.8 4.2  CL 93* 93* 89*  CO2 24 29 25   GLUCOSE 249* 225* 227*  BUN 15 24* 39*  CREATININE 5.09* 7.02* 9.44*  CALCIUM 9.0 8.3* 8.4  PHOS  --   --  2.7   Liver Function Tests:  Recent Labs Lab 06/04/13 1725 06/06/13 0828  AST 27  --   ALT 14  --   ALKPHOS 245*  --   BILITOT 0.4  --   PROT 8.2  --   ALBUMIN 3.8 2.9*   CBC:  Recent Labs Lab 06/04/13 1725 06/05/13 0450 06/06/13 0827  WBC 7.8 4.5 5.6  HGB 11.3* 9.4* 8.5*  HCT 35.1* 29.4* 26.7*  MCV 75.5* 74.2* 74.2*  PLT 127* 108* 99*   Cardiac Enzymes:  Recent Labs Lab 06/04/13 1725  TROPONINI <0.30   CBG:  Recent Labs Lab 06/05/13 0814 06/05/13 1208 06/05/13 1700 06/05/13 2119 06/06/13 1401  GLUCAP 198* 259* 226* 204* 139*    Studies: Dg Chest Portable 1 View  06/04/2013   CLINICAL DATA:  Sepsis.  EXAM: PORTABLE CHEST - 1 VIEW  COMPARISON:  No priors.  FINDINGS: Lung volumes are normal. No consolidative airspace disease. No pleural effusions. No pneumothorax. No  pulmonary nodule or mass noted. Pulmonary vasculature and the cardiomediastinal silhouette are within normal limits.  IMPRESSION: 1.  No radiographic evidence of acute cardiopulmonary disease.   Electronically Signed   By: Vinnie Langton M.D.   On: 06/04/2013 19:28    Scheduled Meds: . amLODipine  10 mg Oral Daily  . aspirin  81 mg Oral Daily  . cloNIDine  0.2 mg Oral BID  . darbepoetin (ARANESP) injection - DIALYSIS  200 mcg Intravenous Q Wed-HD  . doxercalciferol  6 mcg Intravenous Q M,W,F-HD  . ferric gluconate (FERRLECIT/NULECIT) IV  62.5 mg Intravenous Q Wed-HD  . heparin  5,000 Units Subcutaneous Q8H  . insulin aspart  0-9 Units Subcutaneous TID WC  . levothyroxine  125 mcg Oral QAC breakfast  . metoprolol  100 mg Oral QHS  . multivitamin  1 tablet Oral QHS  . pantoprazole  80 mg Oral Q1200  . piperacillin-tazobactam (ZOSYN)  IV  2.25 g Intravenous Q8H  . pregabalin  150 mg Oral QHS  . sevelamer carbonate  2,400 mg Oral TID WC  . simvastatin  20 mg Oral q1800  . sodium chloride  3 mL Intravenous Q12H  . vancomycin  1,000 mg Intravenous Q M,W,F-HD    Principal Problem:   Sepsis Active Problems:   DM (diabetes mellitus)   Mechanical complication of other vascular device, implant, and graft   End stage renal disease    [name] p [phone]   After 7 pm page night team

## 2013-06-06 NOTE — Progress Notes (Signed)
Subjective:  Sudden onset of chills with fever of 103 around 6 PM yesterday, also with nausea, no vomiting or diarrhea, resolved after a few hrs; no current complaints  Objective: Vital signs in last 24 hours: Temp:  [97.9 F (36.6 C)-103 F (39.4 C)] 97.9 F (36.6 C) (09/17 0417) Pulse Rate:  [68-95] 68 (09/17 0417) Resp:  [17-20] 17 (09/17 0417) BP: (122-159)/(68-78) 122/70 mmHg (09/17 0417) SpO2:  [90 %-97 %] 97 % (09/17 0417) Weight:  [96.843 kg (213 lb 8 oz)] 96.843 kg (213 lb 8 oz) (09/16 2116) Weight change: -0.257 kg (-9.1 oz)  Intake/Output from previous day: 09/16 0701 - 09/17 0700 In: 840 [P.O.:840] Out: -  Intake/Output this shift: Total I/O In: 120 [P.O.:120] Out: -   Lab Results:  Recent Labs  06/04/13 1725 06/05/13 0450  WBC 7.8 4.5  HGB 11.3* 9.4*  HCT 35.1* 29.4*  PLT 127* 108*   BMET:  Recent Labs  06/04/13 1725 06/05/13 0450  NA 136 134*  K 4.8 4.8  CL 93* 93*  CO2 24 29  GLUCOSE 249* 225*  BUN 15 24*  CREATININE 5.09* 7.02*  CALCIUM 9.0 8.3*  ALBUMIN 3.8  --    No results found for this basename: PTH,  in the last 72 hours Iron Studies: No results found for this basename: IRON, TIBC, TRANSFERRIN, FERRITIN,  in the last 72 hours  EXAM: General appearance:  Alert, in no apparent distress Resp:  CTA without rales, rhonchi, or wheezes Cardio:  RRR with Gr II/VI systolic murmur, no rub GI: + BS, soft and nontender Extremities:  No edema Access:  AVG @ LUA with + bruit  Dialysis Orders: MWF @ [place]  4:30 92 kg 450/A1.5 2K/2.25Ca Heparin 9000 U Profile 4  Hectorol 2 mcg Epogen 13,000 U Venofer 50 mg on Weds   Assessment/Plan: 1. Fever - sudden onset on 9/15 (as high as 103.1), again yesterday (103); on Vancomycin & Zosyn since 9/15, WBCs 4.5, no clear etiology with CXR, UA negative; BCs pending, [place] to evaluate AVG (not yet done).   2. ESRD - HD on MWF @ [place]; K 4.8 yesterday; Txs recently shortened sec to clotting dialyzers, Heparin  increased. HD pending today.  3. Hypertension/volume - BP 122/70 most recently, on Clonidine 0.2 mg bid, Amlodipine 10 mg qd, Metoprolol 100 mg qhs; CXR negative for edema, not reaching EDW recently (usually up by 0.5 L).  4. Anemia - Hgb down to 9.4, on outpatient Epogen 13,00 U; last T-sat 26% (8/20), Venofer 50 mg qwk. Check CBC and give Aranesp today.  5. Metabolic bone disease - Ca 8.3, last P 2.9, iPTH 332; Hectorol 2 mcg, Renvela 4 with meals.  6. Nutrition - Last Alb 4, renal diet, vitamin. 7. Dialysis access - frequent pain at site, difficult cannulations; followed by Dr. Lucky Cowboy in [place], but by [facility] most recently on 8/1 for L arm Duplex.  8. DM Type 2 - on insulin.  9. Hypothyroidism - on Synthroid.    LOS: 2 days   LYLES,CHARLES 06/06/2013,6:54 AM  I have seen and examined this patient and agree with plan as noted above. Recurrence of fever and chills last evening with no new clues as to source. Blood cultures negative to date.  On vanco and zosyn. [place] of AVG yet to be done.  Cherye Gaertner B,MD 06/06/2013 7:48 AM

## 2013-06-06 NOTE — Progress Notes (Signed)
VASCULAR LAB PRELIMINARY  PRELIMINARY  PRELIMINARY  PRELIMINARY  AVG evaluation for infection  completed.    Preliminary report:  No evidence of perigraft  Fluid.  Graft is patent with no significant change from study performed at [facility] 04/06/2013  Sumeya Yontz, [place], RVS 06/06/2013, 6:11 PM

## 2013-06-07 DIAGNOSIS — J9600 Acute respiratory failure, unspecified whether with hypoxia or hypercapnia: Secondary | ICD-10-CM

## 2013-06-07 LAB — GLUCOSE, CAPILLARY
Glucose-Capillary: 321 mg/dL — ABNORMAL HIGH (ref 70–99)
Glucose-Capillary: 162 mg/dL — ABNORMAL HIGH (ref 70–99)

## 2013-06-07 NOTE — Progress Notes (Signed)
CBGs on 06/06/13  139-214-266 mg/dl      06/07/13  212-321 mg/dl Recommend starting basal insulin if CBGs continue greater than 180 mg/dl.  Patient takes 75/25 Humalog insulin at home 25 units every morning with breakfast and 5-20 units per sliding scale at supper.  Could start 70/30 insulin 12 units in the AM and 10 units in the PM while in the hospital if patient eating at least 50% of meals. Titrate as needed.   If not,  Start Lantus 20 units daily and titrate as needed until patient can eat.  Continue Novolog correction scale TID.   Will continue to follow while in hospital.  Harvel Ricks RN BSN CDE

## 2013-06-07 NOTE — Progress Notes (Signed)
[facility] PROGRESS NOTE  [name] [id number] DOB: 05-22-1961 DOA: 06/04/2013 PCP: Ricke Hey, MD  Assessment/Plan:  Fever/Sepsis: - suspect blood stream infections-awaiting blood cultures - CXR and UA negative.  Blood cultures NTD and urine culture shows no growth - Work up has revealed no source and patient afebrile.  ? Viral etiology. As such will discontinue antibiotic regimen and monitor overnight.  If no fevers and patient continues to improve will consider discharging.  ESRD: - Hemodialysis T, Th, Sat - Has had trouble with graft site over last 2 months - Nephrology on board and following. - Followed by [facility] (Dr. Lucky Cowboy) - Was to have an appointment tomorrow 06/06/13 to discuss for possible replacement  Diabetes Mellitus: - uncontrolled most likely due to infectious etiology - Continue with SSI - Continue current insulin regimen and continue to monitor CBG's. Will adjust pending further values.  Hypertension: - Stable, relatively well controlled with last BP 146/79 - Continue with amlodipine, metoprolol  Hypothyroidism: - s/p thyroidectomy - Stable - Continue with levothyroxine  Hx of SVT: - No reports to me of SVT while here. Has been sinus reportedly. - on telemetry- showed sinus tach. Currently tachycardia resolved. - continue with metoprolol and clonidine  Peripheral Vascular Disease with Peripheral Neuropathy: - Left arm affected (graft side) - Stable - Continue with pregabalin  GERD: - Stable - Continue with pantoprazole   Hyperlipidemia: - Stable - Continue with simvastatin  Code Status: Full Family Communication: None at bedside  Disposition Plan: Remain inpatient   Consultants:  Nephrology  Procedures:  None  Antibiotics:  IV Vancomycin 1000mg /286mL began 06/04/13   IV Zosyn 3.375g given once 06/04/13  IV Zosyn 2.25g q 8 hrs began 06/05/13  HPI/Subjective: No new complaints. No acute issues  reported overnight to me.  Objective: Filed Vitals:   06/07/13 1000  BP: 146/79  Pulse: 73  Temp: 98.2 F (36.8 C)  Resp: 18    Intake/Output Summary (Last 24 hours) at 06/07/13 1752 Last data filed at 06/07/13 1500  Gross per 24 hour  Intake    700 ml  Output      0 ml  Net    700 ml   Filed Weights   06/05/13 2116 06/06/13 0725 06/06/13 2122  Weight: 96.843 kg (213 lb 8 oz) 96.4 kg (212 lb 8.4 oz) 94.257 kg (207 lb 12.8 oz)    Exam:   General:  AAF in no apparent distress, laying supine in bed.  Alert and oriented.  Responds appropriately.  Cardiovascular: , RRR  3/6 systolic murmur heard best over aortic area.    Respiratory: Clear to auscultation bilaterally; no rhonchi, rales, or wheezes  Abdomen: Large abdomen, non-tender, no masses, + BS  Musculoskeletal: 1+ pitting edema bilaterally in lower legs, distal pulses intact and symmetric.  Neurologic: answers questions appropriately, moves all extremities equally   Data Reviewed: Basic Metabolic Panel:  Recent Labs Lab 06/04/13 1725 06/05/13 0450 06/06/13 0828  NA 136 134* 128*  K 4.8 4.8 4.2  CL 93* 93* 89*  CO2 24 29 25   GLUCOSE 249* 225* 227*  BUN 15 24* 39*  CREATININE 5.09* 7.02* 9.44*  CALCIUM 9.0 8.3* 8.4  PHOS  --   --  2.7   Liver Function Tests:  Recent Labs Lab 06/04/13 1725 06/06/13 0828  AST 27  --   ALT 14  --   ALKPHOS 245*  --   BILITOT 0.4  --   PROT 8.2  --  ALBUMIN 3.8 2.9*   CBC:  Recent Labs Lab 06/04/13 1725 06/05/13 0450 06/06/13 0827  WBC 7.8 4.5 5.6  HGB 11.3* 9.4* 8.5*  HCT 35.1* 29.4* 26.7*  MCV 75.5* 74.2* 74.2*  PLT 127* 108* 99*   Cardiac Enzymes:  Recent Labs Lab 06/04/13 1725  TROPONINI <0.30   CBG:  Recent Labs Lab 06/06/13 1658 06/06/13 2126 06/07/13 0757 06/07/13 1214 06/07/13 1709  GLUCAP 214* 266* 212* 321* 162*    Studies: No results found.  Scheduled Meds: . amLODipine  10 mg Oral Daily  . aspirin  81 mg Oral Daily  .  cloNIDine  0.2 mg Oral BID  . darbepoetin (ARANESP) injection - DIALYSIS  200 mcg Intravenous Q Wed-HD  . doxercalciferol  6 mcg Intravenous Q M,W,F-HD  . ferric gluconate (FERRLECIT/NULECIT) IV  62.5 mg Intravenous Q Wed-HD  . heparin  5,000 Units Subcutaneous Q8H  . insulin aspart  0-9 Units Subcutaneous TID WC  . levothyroxine  125 mcg Oral QAC breakfast  . metoprolol  100 mg Oral QHS  . multivitamin  1 tablet Oral QHS  . pantoprazole  80 mg Oral Q1200  . pregabalin  150 mg Oral QHS  . sevelamer carbonate  2,400 mg Oral TID WC  . simvastatin  20 mg Oral q1800  . sodium chloride  3 mL Intravenous Q12H    Principal Problem:   Sepsis Active Problems:   DM (diabetes mellitus)   Mechanical complication of other vascular device, implant, and graft   End stage renal disease    [name] p [phone]   After 7 pm page night team

## 2013-06-07 NOTE — Progress Notes (Signed)
Subjective:   Out of bed, no complaints, but states that more fluid could have been removed yesterday with dialysis, no fever since evening of 9/16, feeling well, hoping for discharge.  Objective: Vital signs in last 24 hours: Temp:  [98.2 F (36.8 C)-98.4 F (36.9 C)] 98.2 F (36.8 C) (09/18 0457) Pulse Rate:  [61-82] 70 (09/18 0457) Resp:  [14-20] 16 (09/18 0457) BP: (105-152)/(52-73) 122/57 mmHg (09/18 0457) SpO2:  [92 %-100 %] 100 % (09/18 0457) Weight:  [94.257 kg (207 lb 12.8 oz)] 94.257 kg (207 lb 12.8 oz) (09/17 2122) Weight change: -0.443 kg (-15.6 oz)  Intake/Output from previous day: 09/17 0701 - 09/18 0700 In: 240 [P.O.:240] Out: 2985    EXAM:  General appearance: Alert, in no apparent distress  Resp: CTA without rales, rhonchi, or wheezes  Cardio: RRR with Gr II/VI systolic murmur, no rub  GI: + BS, soft and nontender  Extremities: No edema  Access: AVG @ LUA with + bruit  Lab Results:  Recent Labs  06/05/13 0450 06/06/13 0827  WBC 4.5 5.6  HGB 9.4* 8.5*  HCT 29.4* 26.7*  PLT 108* 99*   BMET:  Recent Labs  06/04/13 1725 06/05/13 0450 06/06/13 0828  NA 136 134* 128*  K 4.8 4.8 4.2  CL 93* 93* 89*  CO2 24 29 25   GLUCOSE 249* 225* 227*  BUN 15 24* 39*  CREATININE 5.09* 7.02* 9.44*  CALCIUM 9.0 8.3* 8.4  ALBUMIN 3.8  --  2.9*    Medications: . amLODipine  10 mg Oral Daily  . aspirin  81 mg Oral Daily  . cloNIDine  0.2 mg Oral BID  . darbepoetin (ARANESP) injection - DIALYSIS  200 mcg Intravenous Q Wed-HD  . doxercalciferol  6 mcg Intravenous Q M,W,F-HD  . ferric gluconate (FERRLECIT/NULECIT) IV  62.5 mg Intravenous Q Wed-HD  . heparin  5,000 Units Subcutaneous Q8H  . insulin aspart  0-9 Units Subcutaneous TID WC  . levothyroxine  125 mcg Oral QAC breakfast  . metoprolol  100 mg Oral QHS  . multivitamin  1 tablet Oral QHS  . pantoprazole  80 mg Oral Q1200  . piperacillin-tazobactam (ZOSYN)  IV  2.25 g Intravenous Q8H  . pregabalin  150 mg  Oral QHS  . sevelamer carbonate  2,400 mg Oral TID WC  . simvastatin  20 mg Oral q1800  . sodium chloride  3 mL Intravenous Q12H  . vancomycin  1,000 mg Intravenous Q M,W,F-HD   Dialysis Orders: MWF @ [place]  4:30 92 kg 450/A1.5 2K/2.25Ca Heparin 9000 U Profile 4  Hectorol 2 mcg Epogen 13,000 U Venofer 50 mg on Weds   Assessment/Plan: 1. Fever - sudden onset on 9/15 (as high as 103.1), again (103) on 9/16; on Vancomycin & Zosyn since 9/15, WBCs remained low, no clear etiology with CXR, UA negative; BCs pending, [place] to evaluate AVG last night with results pending. [place] of avg no fluid collections.  Source of fever remains unclear. Afebrile about 40 hours.  Not sure what plan will be regarding continuation of antibiotics 2. ESRD - HD on MWF @ [place]; K 4.2 yesterday; Txs recently shortened sec to clotting dialyzers, Heparin increased.  Next HD tomorrow.  3. Hypertension/volume - BP 122/57 most recently, on Clonidine 0.2 mg bid, Amlodipine 10 mg qd, Metoprolol 100 mg qhs; wt 94.3 kg s/p net UF 3 L yesterday; CXR negative for edema, not reaching EDW of 92 recently (usually up by 0.5 L), but wants to maintain present EDW.  4. Anemia - Hgb down to 8.5, on outpatient Epogen 13,00 U; last T-sat 26% (8/20), Venofer 50 mg qwk, s/p Aranesp 200 mcg yesterday.  5. Metabolic bone disease - Ca 8.4 (9.5 corrected), P 2.7, last iPTH 332; Hectorol 2 mcg, Renvela 4 with meals.  6. Nutrition - Alb 2.9, renal diet, vitamin.  7. Dialysis access - frequent pain at site, difficult cannulations; followed by Dr. Lucky Cowboy in [place], but by [facility] most recently on 8/1 for L arm Duplex; [place] last night with results pending. 8. DM Type 2 - on insulin.  9. Hypothyroidism - on Synthroid.     LOS: 3 days   Lopez,Karina 06/07/2013,8:05 AM I have seen and examined this patient and agree with plan as outlined above with highlighted additions.  Fever source remains uncertain.  On vanco and zosyn and afebrile about 40 hours.  Primary team  to decide on antibiotic plan.  Dialysis tomorrow. Titania Gault B,MD 06/07/2013 10:59 AM

## 2013-06-08 DIAGNOSIS — B009 Herpesviral infection, unspecified: Principal | ICD-10-CM

## 2013-06-08 LAB — RENAL FUNCTION PANEL
Albumin: 3 g/dL — ABNORMAL LOW (ref 3.5–5.2)
Chloride: 89 mEq/L — ABNORMAL LOW (ref 96–112)
GFR calc Af Amer: 6 mL/min — ABNORMAL LOW (ref >90)
GFR calc non Af Amer: 5 mL/min — ABNORMAL LOW (ref >90)
Phosphorus: 2.4 mg/dL (ref 2.3–4.6)
Potassium: 4 mEq/L (ref 3.5–5.1)
Sodium: 128 mEq/L — ABNORMAL LOW (ref 135–145)

## 2013-06-08 LAB — CBC
HCT: 26.9 % — ABNORMAL LOW (ref 36.0–46.0)
MCH: 23.6 pg — ABNORMAL LOW (ref 26.0–34.0)
MCV: 73.9 fL — ABNORMAL LOW (ref 78.0–100.0)
RBC: 3.64 MIL/uL — ABNORMAL LOW (ref 3.87–5.11)
WBC: 4.3 10*3/uL (ref 4.0–10.5)

## 2013-06-08 LAB — GLUCOSE, CAPILLARY: Glucose-Capillary: 174 mg/dL — ABNORMAL HIGH (ref 70–99)

## 2013-06-08 MED ORDER — SODIUM CHLORIDE 0.9 % IV SOLN: 100.0000 mL | INTRAVENOUS | Status: DC | PRN

## 2013-06-08 MED ORDER — NEPRO/CARBSTEADY PO LIQD: 237.0000 mL | ORAL | Status: DC | PRN

## 2013-06-08 MED ORDER — ALTEPLASE 2 MG IJ SOLR: 2.0000 mg | Freq: Once | INTRAMUSCULAR | Status: DC | PRN

## 2013-06-08 MED ORDER — ACYCLOVIR 200 MG PO CAPS: 200.0000 mg | ORAL_CAPSULE | Freq: Two times a day (BID) | ORAL | Status: DC

## 2013-06-08 MED ORDER — HEPARIN SODIUM (PORCINE) 1000 UNIT/ML DIALYSIS
9000.0000 [IU] | Freq: Once | INTRAMUSCULAR | Status: AC
  Administered 2013-06-08: 9000 [IU] via INTRAVENOUS_CENTRAL

## 2013-06-08 MED ORDER — PENTAFLUOROPROP-TETRAFLUOROETH EX AERO: 1.0000 "application " | INHALATION_SPRAY | CUTANEOUS | Status: DC | PRN

## 2013-06-08 MED ORDER — SODIUM CHLORIDE 0.9 % IV SOLN
100.0000 mL | INTRAVENOUS | Status: DC | PRN
Start: 2013-06-08 — End: 2013-06-08

## 2013-06-08 MED ORDER — LIDOCAINE-PRILOCAINE 2.5-2.5 % EX CREA: 1.0000 "application " | TOPICAL_CREAM | CUTANEOUS | Status: DC | PRN

## 2013-06-08 MED ORDER — DOXERCALCIFEROL 4 MCG/2ML IV SOLN
INTRAVENOUS | Status: AC
  Administered 2013-06-08: 6 ug via INTRAVENOUS
  Filled 2013-06-08: qty 4

## 2013-06-08 MED ORDER — LIDOCAINE HCL (PF) 1 % IJ SOLN: 5.0000 mL | INTRAMUSCULAR | Status: DC | PRN

## 2013-06-08 MED ORDER — ACYCLOVIR 200 MG PO CAPS
200.0000 mg | ORAL_CAPSULE | Freq: Two times a day (BID) | ORAL | Status: DC
  Administered 2013-06-08: 200 mg via ORAL
  Filled 2013-06-08 (×3): qty 1

## 2013-06-08 MED ORDER — HEPARIN SODIUM (PORCINE) 1000 UNIT/ML DIALYSIS: 1000.0000 [IU] | INTRAMUSCULAR | Status: DC | PRN

## 2013-06-08 NOTE — Progress Notes (Signed)
Subjective:    Seen in dialysis Remains afebrile ATB's were stopped yesterday Complains of some tender sores right at nares and tongue is sore with some bumps on it  Objective: Vital signs in last 24 hours: Temp:  [97.6 F (36.4 C)-98.4 F (36.9 C)] 98 F (36.7 C) (09/19 0655) Pulse Rate:  [60-75] 63 (09/19 0900) Resp:  [13-18] 14 (09/19 0900) BP: (125-160)/(57-79) 142/68 mmHg (09/19 0900) SpO2:  [93 %-100 %] 95 % (09/19 0655) Weight:  [96.163 kg (212 lb)-96.7 kg (213 lb 3 oz)] 96.7 kg (213 lb 3 oz) (09/19 0655) Weight change: -0.238 kg (-8.4 oz)  Intake/Output from previous day: 09/18 0701 - 09/19 0700 In: 1010 [P.O.:960; IV Piggyback:50] Out: 0    EXAM:  General appearance: Alert, in no apparent distress Small vesicular lesions nares  Resp: CTA without rales, rhonchi, or wheezes  Cardio: RRR with Gr II/VI systolic murmur, no rub  GI: + BS, soft and nontender  Extremities: No edema  Access: AVG @ LUA with + bruit - currently accessed for HD  Lab Results:  Recent Labs  06/06/13 0827 06/08/13 0736  WBC 5.6 4.3  HGB 8.5* 8.6*  HCT 26.7* 26.9*  PLT 99* 112*   BMET:   Recent Labs  06/06/13 0828 06/08/13 0736  NA 128* 128*  K 4.2 4.0  CL 89* 89*  CO2 25 25  GLUCOSE 227* 276*  BUN 39* 32*  CREATININE 9.44* 8.55*  CALCIUM 8.4 8.9  ALBUMIN 2.9* 3.0*    Medications: . amLODipine  10 mg Oral Daily  . aspirin  81 mg Oral Daily  . cloNIDine  0.2 mg Oral BID  . darbepoetin (ARANESP) injection - DIALYSIS  200 mcg Intravenous Q Wed-HD  . doxercalciferol  6 mcg Intravenous Q M,W,F-HD  . ferric gluconate (FERRLECIT/NULECIT) IV  62.5 mg Intravenous Q Wed-HD  . heparin  5,000 Units Subcutaneous Q8H  . insulin aspart  0-9 Units Subcutaneous TID WC  . levothyroxine  125 mcg Oral QAC breakfast  . metoprolol  100 mg Oral QHS  . multivitamin  1 tablet Oral QHS  . pantoprazole  80 mg Oral Q1200  . pregabalin  150 mg Oral QHS  . sevelamer carbonate  2,400 mg Oral TID  WC  . simvastatin  20 mg Oral q1800  . sodium chloride  3 mL Intravenous Q12H   Dialysis Orders: MWF @ [place]  4:30 92 kg 450/A1.5 2K/2.25Ca Heparin 9000 U Profile 4  Hectorol 2 mcg Epogen 13,000 U Venofer 50 mg on Weds   Assessment/Plan: 1. Fever - sudden onset on 9/15 (as high as 103.1), again (103) on 9/16; on Vancomycin & Zosyn since 9/15,Stopped on 9/18.   WBCs remained low, no clear etiology with CXR, UA negative; BCs pending, [place] to evaluate AVG last night with no fluid collection noted.   Source of fever remains unclear but remains afebrile and is off ATB's. Has some vesicular lesions right at nares and some mouth sores - ? If herpetic. Primary team has not yet seen - will see if they want to Rx empirically with oral antiviral. 2. ESRD - HD on MWF @ [place];  On HD now 3. Hypertension/volume - BP 122/57 most recently, on Clonidine 0.2 mg bid, Amlodipine 10 mg qd, Metoprolol 100 mg qhs; wt 94.3 kg s/p net UF 3 L yesterday; CXR negative for edema, not reaching EDW of 92 recently (usually up by 0.5 L), but wants to maintain present EDW.  4. Anemia - Hgb down  to 8.5, on outpatient Epogen 13,000 U; last T-sat 26% (8/20), Venofer 50 mg qwk, s/p Aranesp 200 mcg 9/17  5. Metabolic bone disease - Ca 8.4 (9.5 corrected), P 2.7, last iPTH 332; Hectorol 2 mcg, Renvela 4 with meals.  6. Nutrition - Alb 2.9, renal diet, vitamin.  7. Dialysis access - frequent pain at site, difficult cannulations; followed by Dr. Lucky Cowboy in [place] 8. DM Type 2 - on insulin.  9. Hypothyroidism - on Synthroid. 10. Dispo - ? Home ? Oral antiviral for possible herpetic lesions    LOS: 4 days   [name] B 06/08/2013,9:52 AM

## 2013-06-08 NOTE — Procedures (Signed)
I have personally attended this patient's dialysis session.  2K bath with K of 4.  AVG BFR 400.      [name]

## 2013-06-08 NOTE — Discharge Summary (Signed)
Physician Discharge Summary  [name] [id number] DOB: 03-02-1961 DOA: 06/04/2013  PCP: Ricke Hey, MD  Admit date: 06/04/2013 Discharge date: 06/08/2013  Time spent: > 35 minutes  Recommendations for Outpatient Follow-up:  1. Please be sure to f/u with your pcp in 1-2 weeks or sooner 2. Continue your routine dialysis sessions.  Discharge Diagnoses:  Principal Problem:   Sepsis Active Problems:   DM (diabetes mellitus)   Mechanical complication of other vascular device, implant, and graft   End stage renal disease   Discharge Condition: stable  Diet recommendation: Renal diet  Filed Weights   06/07/13 2139 06/08/13 0655 06/08/13 1224  Weight: 96.163 kg (212 lb) 96.7 kg (213 lb 3 oz) 90.7 kg (199 lb 15.3 oz)    History of present illness:  52 y/o AAF with ESRD who presented with fever of unknown origin initially.  Hospital Course:  Fever/HSV 1 infection - suspect blood stream infections-awaiting blood cultures  - CXR and UA negative. Blood cultures NTD and urine culture shows no growth  - Work up has revealed no source but on day of discharge patient reports "having new fever blisters."  Antibiotics discontinued and patient remained afebrile off of the antibiotics.   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  Diabetes Mellitus:  - Continue home regimen - Patient to continue to monitor blood sugars and f/u with her primary care physician for further evaluation and recommendations.  Hypertension:  - Stable, will plan on continuing home regimen.  Hypothyroidism:  - s/p thyroidectomy  - Stable  - Continue with levothyroxine   Hx of SVT:  - No reports to me of SVT while here. Has been sinus reportedly.  - on telemetry- showed sinus tach. Currently tachycardia resolved.  - continue with metoprolol and  clonidine   Peripheral Vascular Disease with Peripheral Neuropathy:  - Left arm affected (graft side)  - Stable  - Continue with pregabalin   GERD:  - Stable  - Continue with pantoprazole   Hyperlipidemia:  - Stable  - Continue with home pravastatin   Procedures:  HD  Consultations:  Nephrology  Discharge Exam: Filed Vitals:   06/08/13 1224  BP: 166/73  Pulse: 77  Temp: 98.1 F (36.7 C)  Resp: 23    General: Pt in NAD, Alert and awake Cardiovascular: RRR, no MRG Respiratory: CTA BL, no wheezes  Discharge Instructions  Discharge Orders   Future Orders Complete By Expires   Call MD for:  difficulty breathing, headache or visual disturbances  As directed    Call MD for:  temperature >100.4  As directed    Diet - low sodium heart healthy  As directed    Discharge instructions  As directed    Comments:     Please continue to follow up with your nephrologist per routine follow up for your ESRD and HD sessions. F/u with your primary care physician in 1-2 weeks or sooner should any new concerns arise.   Increase activity slowly  As directed        Medication List    STOP taking these medications       simvastatin 20 MG tablet  Commonly known as:  ZOCOR      TAKE these medications       acyclovir 200 MG capsule  Commonly known  as:  ZOVIRAX  Take 1 capsule (200 mg total) by mouth 2 (two) times daily.     amLODipine 10 MG tablet  Commonly known as:  NORVASC  Take 10 mg by mouth daily.     aspirin 81 MG chewable tablet  Chew 81 mg by mouth daily.     cloNIDine 0.2 MG tablet  Commonly known as:  CATAPRES  Take 0.2 mg by mouth 2 (two) times daily. Takes twice daily.  On HD days (MWF), take first dose after HD.     esomeprazole 40 MG capsule  Commonly known as:  NEXIUM  Take 40 mg by mouth daily before breakfast.     insulin lispro protamine-lispro (75-25) 100 UNIT/ML Susp injection  Commonly known as:  HUMALOG 75/25  Inject 25 Units into the skin  daily with breakfast.     insulin lispro protamine-lispro (75-25) 100 UNIT/ML Susp injection  Commonly known as:  HUMALOG 75/25  - Inject 0-20 Units into the skin daily with supper. CBG < 84, zero unit  - CBG 85 - 150, 5 units  - CBG > 150, 20 units     levothyroxine 125 MCG tablet  Commonly known as:  SYNTHROID, LEVOTHROID  Take 125 mcg by mouth daily before breakfast.     metoprolol 100 MG tablet  Commonly known as:  LOPRESSOR  Take 100 mg by mouth at bedtime. Tartrate once daily     multivitamin Tabs tablet  Take 1 tablet by mouth at bedtime.     DIALYVITE PO  Take 1 tablet by mouth daily.     oxyCODONE-acetaminophen 5-325 MG per tablet  Commonly known as:  PERCOCET/ROXICET  Take 1 tablet by mouth every 8 (eight) hours as needed for pain.     pravastatin 40 MG tablet  Commonly known as:  PRAVACHOL  Take 40 mg by mouth at bedtime.     pregabalin 150 MG capsule  Commonly known as:  LYRICA  Take 150 mg by mouth at bedtime.     promethazine 25 MG tablet  Commonly known as:  PHENERGAN  Take 25 mg by mouth every 8 (eight) hours as needed for nausea.     sevelamer carbonate 800 MG tablet  Commonly known as:  RENVELA  Take 2,400 mg by mouth 3 (three) times daily with meals.     sevelamer carbonate 800 MG tablet  Commonly known as:  RENVELA  Take 1,600 mg by mouth as needed (take with snack).       No Known Allergies    The results of significant diagnostics from this hospitalization (including imaging, microbiology, ancillary and laboratory) are listed below for reference.    Significant Diagnostic Studies: Dg Chest Portable 1 View  06/04/2013   CLINICAL DATA:  Sepsis.  EXAM: PORTABLE CHEST - 1 VIEW  COMPARISON:  No priors.  FINDINGS: Lung volumes are normal. No consolidative airspace disease. No pleural effusions. No pneumothorax. No pulmonary nodule or mass noted. Pulmonary vasculature and the cardiomediastinal silhouette are within normal limits.  IMPRESSION:  1.  No radiographic evidence of acute cardiopulmonary disease.   Electronically Signed   By: Vinnie Langton M.D.   On: 06/04/2013 19:28    Microbiology: Recent Results (from the past 240 hour(s))  CULTURE, BLOOD (ROUTINE X 2)     Status: None   Collection Time    06/04/13  5:35 PM      Result Value Range Status   Specimen Description BLOOD ARM RIGHT   Final  Special Requests BOTTLES DRAWN AEROBIC ONLY 5CC   Final   Culture  Setup Time     Final   Value: 06/05/2013 00:22     Performed at [facility]   Culture     Final   Value:        BLOOD CULTURE RECEIVED NO GROWTH TO DATE CULTURE WILL BE HELD FOR 5 DAYS BEFORE ISSUING A FINAL NEGATIVE REPORT     Performed at [facility]   Report Status PENDING   Incomplete  CULTURE, BLOOD (ROUTINE X 2)     Status: None   Collection Time    06/04/13  5:50 PM      Result Value Range Status   Specimen Description BLOOD ARM RIGHT   Final   Special Requests BOTTLES DRAWN AEROBIC ONLY 5CC   Final   Culture  Setup Time     Final   Value: 06/05/2013 00:22     Performed at [facility]   Culture     Final   Value:        BLOOD CULTURE RECEIVED NO GROWTH TO DATE CULTURE WILL BE HELD FOR 5 DAYS BEFORE ISSUING A FINAL NEGATIVE REPORT     Performed at [facility]   Report Status PENDING   Incomplete  URINE CULTURE     Status: None   Collection Time    06/04/13  7:24 PM      Result Value Range Status   Specimen Description URINE, CATHETERIZED   Final   Special Requests NONE   Final   Culture  Setup Time     Final   Value: 06/04/2013 21:20     Performed at [place]     Final   Value: NO GROWTH     Performed at [facility]   Culture     Final   Value: NO GROWTH     Performed at [facility]   Report Status 06/05/2013 FINAL   Final  CULTURE, BLOOD (ROUTINE X 2)     Status: None   Collection Time    06/05/13  7:26 PM      Result Value Range Status   Specimen  Description BLOOD RIGHT ARM   Final   Special Requests BOTTLES DRAWN AEROBIC ONLY 10CC   Final   Culture  Setup Time     Final   Value: 06/06/2013 05:09     Performed at [facility]   Culture     Final   Value:        BLOOD CULTURE RECEIVED NO GROWTH TO DATE CULTURE WILL BE HELD FOR 5 DAYS BEFORE ISSUING A FINAL NEGATIVE REPORT     Performed at [facility]   Report Status PENDING   Incomplete  CULTURE, BLOOD (ROUTINE X 2)     Status: None   Collection Time    06/05/13  7:34 PM      Result Value Range Status   Specimen Description BLOOD RIGHT WRIST   Final   Special Requests BOTTLES DRAWN AEROBIC ONLY 5.5CC   Final   Culture  Setup Time     Final   Value: 06/06/2013 05:09     Performed at [facility]   Culture     Final   Value:        BLOOD CULTURE RECEIVED NO GROWTH TO DATE CULTURE WILL BE HELD FOR 5 DAYS BEFORE ISSUING A FINAL NEGATIVE  REPORT     Performed at [facility]   Report Status PENDING   Incomplete     Labs: Basic Metabolic Panel:  Recent Labs Lab 06/04/13 1725 06/05/13 0450 06/06/13 0828 06/08/13 0736  NA 136 134* 128* 128*  K 4.8 4.8 4.2 4.0  CL 93* 93* 89* 89*  CO2 24 29 25 25   GLUCOSE 249* 225* 227* 276*  BUN 15 24* 39* 32*  CREATININE 5.09* 7.02* 9.44* 8.55*  CALCIUM 9.0 8.3* 8.4 8.9  PHOS  --   --  2.7 2.4   Liver Function Tests:  Recent Labs Lab 06/04/13 1725 06/06/13 0828 06/08/13 0736  AST 27  --   --   ALT 14  --   --   ALKPHOS 245*  --   --   BILITOT 0.4  --   --   PROT 8.2  --   --   ALBUMIN 3.8 2.9* 3.0*   No results found for this basename: LIPASE, AMYLASE,  in the last 168 hours No results found for this basename: AMMONIA,  in the last 168 hours CBC:  Recent Labs Lab 06/04/13 1725 06/05/13 0450 06/06/13 0827 06/08/13 0736  WBC 7.8 4.5 5.6 4.3  HGB 11.3* 9.4* 8.5* 8.6*  HCT 35.1* 29.4* 26.7* 26.9*  MCV 75.5* 74.2* 74.2* 73.9*  PLT 127* 108* 99* 112*   Cardiac Enzymes:  Recent  Labs Lab 06/04/13 1725  TROPONINI <0.30   BNP: BNP (last 3 results) No results found for this basename: PROBNP,  in the last 8760 hours CBG:  Recent Labs Lab 06/07/13 0757 06/07/13 1214 06/07/13 1709 06/07/13 2143 06/08/13 1241  GLUCAP 212* 321* 162* 240* 174*       Signed:  Velvet Bathe  [facility] 06/08/2013, 2:36 PM

## 2013-06-11 LAB — CULTURE, BLOOD (ROUTINE X 2): Culture: NO GROWTH

## 2013-06-12 LAB — CULTURE, BLOOD (ROUTINE X 2): Culture: NO GROWTH

## 2013-08-08 ENCOUNTER — Telehealth: Payer: Self-pay | Admitting: *Deleted

## 2013-08-08 MED ORDER — PREGABALIN 150 MG PO CAPS: 150.0000 mg | ORAL_CAPSULE | Freq: Every day | ORAL | Status: DC

## 2013-08-08 NOTE — Telephone Encounter (Addendum)
Faxed request for Lyrica 150mg  1 tablet po hs.  Refilled for #30 and 3 additional refills.  I called [phone], informed pt Lyrica needed to be hand carried to pharmacy.  Pt asked to have the rx mailed and to be scheduled for an appt to be seen and then she' schedule her transportation.  Referred to scheduler.  Lyrica rx was mailed to pt's home address.

## 2013-08-08 NOTE — Telephone Encounter (Signed)
TOLD PT SHE NEEDED A REFERRAL BEFORE WE CAN SCHEDULE AN APPOINTMENT

## 2013-08-20 DIAGNOSIS — I739 Peripheral vascular disease, unspecified: Secondary | ICD-10-CM

## 2013-08-20 HISTORY — PX: PERIPHERAL ATHRECTOMY: SHX6227

## 2013-08-20 HISTORY — DX: Peripheral vascular disease, unspecified: I73.9

## 2013-09-22 ENCOUNTER — Emergency Department (HOSPITAL_COMMUNITY): Payer: Medicare Other

## 2013-09-22 ENCOUNTER — Encounter (HOSPITAL_COMMUNITY): Payer: Self-pay | Admitting: Emergency Medicine

## 2013-09-22 ENCOUNTER — Emergency Department (HOSPITAL_COMMUNITY)
Admission: EM | Admit: 2013-09-22 | Discharge: 2013-09-22 | Disposition: A | Discharge status: Home / Self Care | Payer: Medicare Other | Attending: Emergency Medicine | Admitting: Emergency Medicine

## 2013-09-22 DIAGNOSIS — Z862 Personal history of diseases of the blood and blood-forming organs and certain disorders involving the immune mechanism: Secondary | ICD-10-CM | POA: Insufficient documentation

## 2013-09-22 DIAGNOSIS — N186 End stage renal disease: Secondary | ICD-10-CM | POA: Insufficient documentation

## 2013-09-22 DIAGNOSIS — Z8669 Personal history of other diseases of the nervous system and sense organs: Secondary | ICD-10-CM | POA: Insufficient documentation

## 2013-09-22 DIAGNOSIS — S52599A Other fractures of lower end of unspecified radius, initial encounter for closed fracture: Secondary | ICD-10-CM | POA: Insufficient documentation

## 2013-09-22 DIAGNOSIS — K3184 Gastroparesis: Secondary | ICD-10-CM | POA: Insufficient documentation

## 2013-09-22 DIAGNOSIS — I120 Hypertensive chronic kidney disease with stage 5 chronic kidney disease or end stage renal disease: Secondary | ICD-10-CM | POA: Insufficient documentation

## 2013-09-22 DIAGNOSIS — W19XXXA Unspecified fall, initial encounter: Secondary | ICD-10-CM

## 2013-09-22 DIAGNOSIS — E1149 Type 2 diabetes mellitus with other diabetic neurological complication: Secondary | ICD-10-CM | POA: Insufficient documentation

## 2013-09-22 DIAGNOSIS — Z79899 Other long term (current) drug therapy: Secondary | ICD-10-CM | POA: Insufficient documentation

## 2013-09-22 DIAGNOSIS — E785 Hyperlipidemia, unspecified: Secondary | ICD-10-CM | POA: Insufficient documentation

## 2013-09-22 DIAGNOSIS — K219 Gastro-esophageal reflux disease without esophagitis: Secondary | ICD-10-CM | POA: Insufficient documentation

## 2013-09-22 DIAGNOSIS — W010XXA Fall on same level from slipping, tripping and stumbling without subsequent striking against object, initial encounter: Secondary | ICD-10-CM | POA: Insufficient documentation

## 2013-09-22 DIAGNOSIS — Z87891 Personal history of nicotine dependence: Secondary | ICD-10-CM | POA: Insufficient documentation

## 2013-09-22 DIAGNOSIS — Z992 Dependence on renal dialysis: Secondary | ICD-10-CM | POA: Insufficient documentation

## 2013-09-22 DIAGNOSIS — Z7982 Long term (current) use of aspirin: Secondary | ICD-10-CM | POA: Insufficient documentation

## 2013-09-22 DIAGNOSIS — Z794 Long term (current) use of insulin: Secondary | ICD-10-CM | POA: Insufficient documentation

## 2013-09-22 DIAGNOSIS — Y939 Activity, unspecified: Secondary | ICD-10-CM | POA: Insufficient documentation

## 2013-09-22 DIAGNOSIS — Z9889 Other specified postprocedural states: Secondary | ICD-10-CM | POA: Insufficient documentation

## 2013-09-22 DIAGNOSIS — S62101A Fracture of unspecified carpal bone, right wrist, initial encounter for closed fracture: Secondary | ICD-10-CM

## 2013-09-22 DIAGNOSIS — Y92009 Unspecified place in unspecified non-institutional (private) residence as the place of occurrence of the external cause: Secondary | ICD-10-CM | POA: Insufficient documentation

## 2013-09-22 DIAGNOSIS — E039 Hypothyroidism, unspecified: Secondary | ICD-10-CM | POA: Insufficient documentation

## 2013-09-22 MED ORDER — OXYCODONE-ACETAMINOPHEN 5-325 MG PO TABS
2.0000 | ORAL_TABLET | Freq: Once | ORAL | Status: AC
Start: 2013-09-22 — End: 2013-09-22
  Administered 2013-09-22: 2 via ORAL
  Filled 2013-09-22: qty 2

## 2013-09-22 MED ORDER — TRAMADOL HCL 50 MG PO TABS: 50.0000 mg | ORAL_TABLET | Freq: Four times a day (QID) | ORAL | Status: DC | PRN

## 2013-09-22 MED ORDER — IBUPROFEN 400 MG PO TABS
800.0000 mg | ORAL_TABLET | Freq: Once | ORAL | Status: AC
  Administered 2013-09-22: 800 mg via ORAL
  Filled 2013-09-22: qty 2

## 2013-09-22 MED ORDER — IBUPROFEN 600 MG PO TABS: 600.0000 mg | ORAL_TABLET | Freq: Four times a day (QID) | ORAL | Status: DC | PRN

## 2013-09-22 NOTE — ED Provider Notes (Signed)
Medical screening examination/treatment/procedure(s) were performed by non-physician practitioner and as supervising physician I was immediately available for consultation/collaboration.  EKG Interpretation   None         Osvaldo Shipper, MD 09/22/13 1555

## 2013-09-22 NOTE — Discharge Instructions (Signed)
Cast or Splint Care °Casts and splints support injured limbs and keep bones from moving while they heal.  °HOME CARE °· Keep the cast or splint uncovered during the drying period. °· A plaster cast can take 24 to 48 hours to dry. °· A fiberglass cast will dry in less than 1 hour. °· Do not rest the cast on anything harder than a pillow for 24 hours. °· Do not put weight on your injured limb. Do not put pressure on the cast. Wait for your doctor's approval. °· Keep the cast or splint dry. °· Cover the cast or splint with a plastic bag during baths or wet weather. °· If you have a cast over your chest and belly (trunk), take sponge baths until the cast is taken off. °· If your cast gets wet, dry it with a towel or blow dryer. Use the cool setting on the blow dryer. °· Keep your cast or splint clean. Wash a dirty cast with a damp cloth. °· Do not put any objects under your cast or splint. °· Do not scratch the skin under the cast with an object. If itching is a problem, use a blow dryer on a cool setting over the itchy area. °· Do not trim or cut your cast. °· Do not take out the padding from inside your cast. °· Exercise your joints near the cast as told by your doctor. °· Raise (elevate) your injured limb on 1 or 2 pillows for the first 1 to 3 days. °GET HELP IF: °· Your cast or splint cracks. °· Your cast or splint is too tight or too loose. °· You itch badly under the cast. °· Your cast gets wet or has a soft spot. °· You have a bad smell coming from the cast. °· You get an object stuck under the cast. °· Your skin around the cast becomes red or sore. °· You have new or more pain after the cast is put on. °GET HELP RIGHT AWAY IF: °· You have fluid leaking through the cast. °· You cannot move your fingers or toes. °· Your fingers or toes turn blue or white or are cool, painful, or puffy (swollen). °· You have tingling or lose feeling (numbness) around the injured area. °· You have bad pain or pressure under the  cast. °· You have trouble breathing or have shortness of breath. °· You have chest pain. °Document Released: 01/06/2011 Document Revised: 05/09/2013 Document Reviewed: 03/15/2013 °Exit[employer]® Patient Information ©2014 [employer]. ° °

## 2013-09-22 NOTE — ED Notes (Signed)
Ortho tech at bedside 

## 2013-09-22 NOTE — ED Notes (Signed)
Pt slipped when getting up this morning and fell. Pt tried to break fall with rt hand and now has some rt wrist pain. Pt rates pain 10/10.

## 2013-09-22 NOTE — ED Provider Notes (Signed)
CSN: [id number]     Arrival date & time 09/22/13  1110 History  This chart was scribed for non-physician practitioner, Noland Fordyce, PA-C working with Osvaldo Shipper, MD by Frederich Balding, ED scribe. This patient was seen in room TR10C/TR10C and the patient's care was started at 12:08 PM.   Chief Complaint  Patient presents with  . Fall  . Wrist Pain   The history is provided by the patient. No language interpreter was used.   HPI Comments: [name] is a 53 y.o. female who presents to the Emergency Department complaining of a fall that occurred earlier this morning. Pt states she slipped on the floor and fell onto her right wrist. She has sudden onset, aching, throbbing right wrist pain that radiates up her arm into her shoulder with associated swelling. Rates her pain 10/10. Movement and palpation worsen the pain. Denies numbness or tingling. Denies hitting her head or any other injuries. Pt is right hand dominant.   Past Medical History  Diagnosis Date  . Diabetes mellitus   . Supraventricular tachycardia   . Renal disorder     Hemodialysis T, TH, Sat  . Thyroid disease   . Renal disorder   . Diabetes mellitus without complication   . Hypertension   . Thyroid disorder   . GERD (gastroesophageal reflux disease)   . Neuropathy   . Hypothyroidism   . Peripheral vascular disease   . Anemia   . Hemodialysis patient   . Hyperlipidemia   . SVT (supraventricular tachycardia)   . Sickle cell trait   . Gastroparesis due to DM    Past Surgical History  Procedure Laterality Date  . Abdominal hysterectomy    . Knee surgery      right  . Thyroid surgery    . Thyroidectomy    . Knee arthroplasty    . Cataract extraction, bilateral    . Colonoscopy  07/19/2012    Procedure: COLONOSCOPY;  Surgeon: Beryle Beams, MD;  Location: [place];  Service: Endoscopy;  Laterality: N/A;  . Arteriovenous graft placement Left 2012    Dr. Harrington Challenger  2nd graft   Family History  Problem  Relation Age of Onset  . Diabetes Mother   . Hypertension Mother   . Heart attack Mother   . Diabetes Father   . Hypertension Father    History  Substance Use Topics  . Smoking status: Former Smoker    Types: Cigarettes    Quit date: 09/20/1993  . Smokeless tobacco: Never Used  . Alcohol Use: No   OB History   Grav Para Term Preterm Abortions TAB SAB Ect Mult Living                 Review of Systems  Musculoskeletal: Positive for arthralgias and joint swelling.  All other systems reviewed and are negative.    Allergies  Review of patient's allergies indicates no known allergies.  Home Medications   Current Outpatient Rx  Name  Route  Sig  Dispense  Refill  . acyclovir (ZOVIRAX) 200 MG capsule   Oral   Take 1 capsule (200 mg total) by mouth 2 (two) times daily.   10 capsule   0   . amLODipine (NORVASC) 10 MG tablet   Oral   Take 10 mg by mouth daily.         Marland Kitchen aspirin 81 MG chewable tablet   Oral   Chew 81 mg by mouth daily.         Marland Kitchen  B Complex-C-Folic Acid (DIALYVITE PO)   Oral   Take 1 tablet by mouth daily.         . cloNIDine (CATAPRES) 0.2 MG tablet   Oral   Take 0.2 mg by mouth 2 (two) times daily. Takes twice daily.  On HD days (MWF), take first dose after HD.         Marland Kitchen esomeprazole (NEXIUM) 40 MG capsule   Oral   Take 40 mg by mouth daily before breakfast.         . ibuprofen (ADVIL,MOTRIN) 600 MG tablet   Oral   Take 1 tablet (600 mg total) by mouth every 6 (six) hours as needed.   30 tablet   0   . insulin lispro protamine-lispro (HUMALOG 75/25) (75-25) 100 UNIT/ML SUSP injection   Subcutaneous   Inject 25 Units into the skin daily with breakfast.         . insulin lispro protamine-lispro (HUMALOG 75/25) (75-25) 100 UNIT/ML SUSP injection   Subcutaneous   Inject 0-20 Units into the skin daily with supper. CBG < 84, zero unit CBG 85 - 150, 5 units CBG > 150, 20 units         . levothyroxine (SYNTHROID, LEVOTHROID) 125  MCG tablet   Oral   Take 125 mcg by mouth daily before breakfast.          . metoprolol (LOPRESSOR) 100 MG tablet   Oral   Take 100 mg by mouth at bedtime. Tartrate once daily         . multivitamin (RENA-VIT) TABS tablet   Oral   Take 1 tablet by mouth at bedtime.          Marland Kitchen oxyCODONE-acetaminophen (PERCOCET/ROXICET) 5-325 MG per tablet   Oral   Take 1 tablet by mouth every 8 (eight) hours as needed for pain.          . pravastatin (PRAVACHOL) 40 MG tablet   Oral   Take 40 mg by mouth at bedtime.          . pregabalin (LYRICA) 150 MG capsule   Oral   Take 1 capsule (150 mg total) by mouth at bedtime.   30 capsule   3   . promethazine (PHENERGAN) 25 MG tablet   Oral   Take 25 mg by mouth every 8 (eight) hours as needed for nausea.         . sevelamer carbonate (RENVELA) 800 MG tablet   Oral   Take 2,400 mg by mouth 3 (three) times daily with meals.         . sevelamer carbonate (RENVELA) 800 MG tablet   Oral   Take 1,600 mg by mouth as needed (take with snack).         . traMADol (ULTRAM) 50 MG tablet   Oral   Take 1 tablet (50 mg total) by mouth every 6 (six) hours as needed.   15 tablet   0    BP 165/80  Pulse 79  Temp(Src) 97.5 F (36.4 C) (Oral)  Resp 16  SpO2 98%  Physical Exam  Nursing note and vitals reviewed. Constitutional: She appears well-developed and well-nourished. No distress.  HENT:  Head: Normocephalic and atraumatic.  Eyes: Conjunctivae are normal. No scleral icterus.  Neck: Normal range of motion.  Cardiovascular: Normal rate, regular rhythm and normal heart sounds.   Pulmonary/Chest: Effort normal and breath sounds normal. No respiratory distress. She has no wheezes. She has no rales. She exhibits  no tenderness.  Musculoskeletal: Normal range of motion.  Mild to moderate edema to dorsum of right wrist. Severe tenderness to palpation. Decreased ROM at wrist and fingers due to pain. Distal phalanx of right middle finger  surgically removed. Capillary refill less than 2 seconds. Radial pulse 2+.  Neurological: She is alert.  Skin: Skin is warm and dry. She is not diaphoretic.    ED Course  Procedures (including critical care time)  DIAGNOSTIC STUDIES: Oxygen Saturation is 98% on RA, normal by my interpretation.    COORDINATION OF CARE: 12:11 PM-Discussed treatment plan which includes a splint, arm sling, pain medication and orthopedic follow up with pt at bedside and pt agreed to plan.   Labs Review Labs Reviewed - No data to display Imaging Review Dg Wrist Complete Right  09/22/2013   CLINICAL DATA:  Fall with right wrist pain and swelling.  EXAM: RIGHT WRIST - COMPLETE 3+ VIEW  COMPARISON:  None.  FINDINGS: Four views of the wrist demonstrate a minimally displaced fracture of the distal radius. There is mild cortical buckling in the distal radius and the fracture extends to the radiocarpal joint. Distal ulnar is intact. Wrist is located. There is soft tissue swelling.  IMPRESSION: Minimally displaced fracture of the distal right radius. There is intra-articular involvement.   Electronically Signed   By: Markus Daft M.D.   On: 09/22/2013 12:03    EKG Interpretation   None       MDM   1. Right wrist fracture, closed, initial encounter   2. Fall at home, initial encounter     I personally performed the services described in this documentation, which was scribed in my presence. The recorded information has been reviewed and is accurate.   Noland Fordyce, PA-C 09/22/13 1238

## 2013-09-22 NOTE — ED Notes (Signed)
Pt reports falling on slippery floor this am, broke her fall and now having right wrist pain.

## 2013-09-22 NOTE — Progress Notes (Signed)
Orthopedic Tech Progress Note Patient Details:  [name] 03-Feb-1961 [id number]  Ortho Devices Type of Ortho Device: Arm sling;Sugartong splint Ortho Device/Splint Interventions: Application   Cammer, Theodoro Parma 09/22/2013, 12:51 PM

## 2013-10-02 ENCOUNTER — Ambulatory Visit: Payer: Medicare Other

## 2013-10-05 ENCOUNTER — Encounter (HOSPITAL_COMMUNITY): Payer: Self-pay | Admitting: Emergency Medicine

## 2013-10-05 ENCOUNTER — Emergency Department (HOSPITAL_COMMUNITY)
Admission: EM | Admit: 2013-10-05 | Discharge: 2013-10-05 | Disposition: A | Discharge status: Home / Self Care | Payer: Medicare Other | Attending: Emergency Medicine | Admitting: Emergency Medicine

## 2013-10-05 DIAGNOSIS — E039 Hypothyroidism, unspecified: Secondary | ICD-10-CM | POA: Insufficient documentation

## 2013-10-05 DIAGNOSIS — M25531 Pain in right wrist: Secondary | ICD-10-CM

## 2013-10-05 DIAGNOSIS — Y92410 Unspecified street and highway as the place of occurrence of the external cause: Secondary | ICD-10-CM | POA: Insufficient documentation

## 2013-10-05 DIAGNOSIS — K219 Gastro-esophageal reflux disease without esophagitis: Secondary | ICD-10-CM | POA: Insufficient documentation

## 2013-10-05 DIAGNOSIS — Z87891 Personal history of nicotine dependence: Secondary | ICD-10-CM | POA: Insufficient documentation

## 2013-10-05 DIAGNOSIS — M25539 Pain in unspecified wrist: Secondary | ICD-10-CM | POA: Insufficient documentation

## 2013-10-05 DIAGNOSIS — Z79899 Other long term (current) drug therapy: Secondary | ICD-10-CM | POA: Insufficient documentation

## 2013-10-05 DIAGNOSIS — K3184 Gastroparesis: Secondary | ICD-10-CM | POA: Insufficient documentation

## 2013-10-05 DIAGNOSIS — R519 Headache, unspecified: Secondary | ICD-10-CM

## 2013-10-05 DIAGNOSIS — G8911 Acute pain due to trauma: Secondary | ICD-10-CM | POA: Insufficient documentation

## 2013-10-05 DIAGNOSIS — Z992 Dependence on renal dialysis: Secondary | ICD-10-CM | POA: Insufficient documentation

## 2013-10-05 DIAGNOSIS — N186 End stage renal disease: Secondary | ICD-10-CM | POA: Insufficient documentation

## 2013-10-05 DIAGNOSIS — Z794 Long term (current) use of insulin: Secondary | ICD-10-CM | POA: Insufficient documentation

## 2013-10-05 DIAGNOSIS — I120 Hypertensive chronic kidney disease with stage 5 chronic kidney disease or end stage renal disease: Secondary | ICD-10-CM | POA: Insufficient documentation

## 2013-10-05 DIAGNOSIS — E785 Hyperlipidemia, unspecified: Secondary | ICD-10-CM | POA: Insufficient documentation

## 2013-10-05 DIAGNOSIS — Z862 Personal history of diseases of the blood and blood-forming organs and certain disorders involving the immune mechanism: Secondary | ICD-10-CM | POA: Insufficient documentation

## 2013-10-05 DIAGNOSIS — E1149 Type 2 diabetes mellitus with other diabetic neurological complication: Secondary | ICD-10-CM | POA: Insufficient documentation

## 2013-10-05 DIAGNOSIS — S0990XA Unspecified injury of head, initial encounter: Secondary | ICD-10-CM | POA: Insufficient documentation

## 2013-10-05 DIAGNOSIS — Z7982 Long term (current) use of aspirin: Secondary | ICD-10-CM | POA: Insufficient documentation

## 2013-10-05 DIAGNOSIS — Y9389 Activity, other specified: Secondary | ICD-10-CM | POA: Insufficient documentation

## 2013-10-05 DIAGNOSIS — Z8669 Personal history of other diseases of the nervous system and sense organs: Secondary | ICD-10-CM | POA: Insufficient documentation

## 2013-10-05 DIAGNOSIS — R51 Headache: Secondary | ICD-10-CM

## 2013-10-05 MED ORDER — HYDROCODONE-ACETAMINOPHEN 5-325 MG PO TABS: 1.0000 | ORAL_TABLET | Freq: Four times a day (QID) | ORAL | Status: DC | PRN

## 2013-10-05 MED ORDER — HYDROCODONE-ACETAMINOPHEN 5-325 MG PO TABS
1.0000 | ORAL_TABLET | Freq: Once | ORAL | Status: AC
  Administered 2013-10-05: 1 via ORAL
  Filled 2013-10-05: qty 1
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
## 2013-10-05 NOTE — ED Notes (Addendum)
Disregard all discharge information.  Charted in error.  Charge, RN advised.   Okay to note.

## 2013-10-05 NOTE — ED Notes (Signed)
Pt requesting to leave.  EDPA made aware.  Pt verbalized understanding of f/u and return precautions.

## 2013-10-05 NOTE — ED Provider Notes (Signed)
Medical screening examination/treatment/procedure(s) were performed by non-physician practitioner and as supervising physician I was immediately available for consultation/collaboration.  EKG Interpretation   None         Ephraim Hamburger, MD 10/05/13 2239
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
## 2013-10-07 ENCOUNTER — Non-Acute Institutional Stay (HOSPITAL_COMMUNITY)
Admission: EM | Admit: 2013-10-07 | Discharge: 2013-10-10 | Disposition: A | Discharge status: Home / Self Care | Payer: Medicare Other | Attending: Emergency Medicine | Admitting: Emergency Medicine

## 2013-10-07 ENCOUNTER — Encounter (HOSPITAL_COMMUNITY): Payer: Self-pay | Admitting: Emergency Medicine

## 2013-10-07 ENCOUNTER — Emergency Department (HOSPITAL_COMMUNITY): Payer: Medicare Other

## 2013-10-07 DIAGNOSIS — E875 Hyperkalemia: Secondary | ICD-10-CM | POA: Diagnosis present

## 2013-10-07 DIAGNOSIS — I471 Supraventricular tachycardia: Secondary | ICD-10-CM

## 2013-10-07 DIAGNOSIS — D649 Anemia, unspecified: Secondary | ICD-10-CM | POA: Insufficient documentation

## 2013-10-07 DIAGNOSIS — I120 Hypertensive chronic kidney disease with stage 5 chronic kidney disease or end stage renal disease: Secondary | ICD-10-CM | POA: Insufficient documentation

## 2013-10-07 DIAGNOSIS — R110 Nausea: Secondary | ICD-10-CM | POA: Insufficient documentation

## 2013-10-07 DIAGNOSIS — E1149 Type 2 diabetes mellitus with other diabetic neurological complication: Secondary | ICD-10-CM | POA: Insufficient documentation

## 2013-10-07 DIAGNOSIS — G589 Mononeuropathy, unspecified: Secondary | ICD-10-CM | POA: Insufficient documentation

## 2013-10-07 DIAGNOSIS — E039 Hypothyroidism, unspecified: Secondary | ICD-10-CM | POA: Insufficient documentation

## 2013-10-07 DIAGNOSIS — I498 Other specified cardiac arrhythmias: Secondary | ICD-10-CM | POA: Insufficient documentation

## 2013-10-07 DIAGNOSIS — Z7902 Long term (current) use of antithrombotics/antiplatelets: Secondary | ICD-10-CM | POA: Insufficient documentation

## 2013-10-07 DIAGNOSIS — K219 Gastro-esophageal reflux disease without esophagitis: Secondary | ICD-10-CM | POA: Insufficient documentation

## 2013-10-07 DIAGNOSIS — D573 Sickle-cell trait: Secondary | ICD-10-CM | POA: Insufficient documentation

## 2013-10-07 DIAGNOSIS — E785 Hyperlipidemia, unspecified: Secondary | ICD-10-CM | POA: Insufficient documentation

## 2013-10-07 DIAGNOSIS — K3184 Gastroparesis: Secondary | ICD-10-CM | POA: Insufficient documentation

## 2013-10-07 DIAGNOSIS — I739 Peripheral vascular disease, unspecified: Secondary | ICD-10-CM | POA: Insufficient documentation

## 2013-10-07 DIAGNOSIS — Z992 Dependence on renal dialysis: Secondary | ICD-10-CM | POA: Insufficient documentation

## 2013-10-07 DIAGNOSIS — R079 Chest pain, unspecified: Secondary | ICD-10-CM | POA: Insufficient documentation

## 2013-10-07 DIAGNOSIS — R0602 Shortness of breath: Secondary | ICD-10-CM | POA: Insufficient documentation

## 2013-10-07 DIAGNOSIS — Z87891 Personal history of nicotine dependence: Secondary | ICD-10-CM | POA: Insufficient documentation

## 2013-10-07 DIAGNOSIS — Z794 Long term (current) use of insulin: Secondary | ICD-10-CM | POA: Insufficient documentation

## 2013-10-07 DIAGNOSIS — N186 End stage renal disease: Secondary | ICD-10-CM | POA: Insufficient documentation

## 2013-10-07 DIAGNOSIS — Z7982 Long term (current) use of aspirin: Secondary | ICD-10-CM | POA: Insufficient documentation

## 2013-10-07 LAB — BASIC METABOLIC PANEL
BUN: 54 mg/dL — AB (ref 6–23)
CO2: 25 mEq/L (ref 19–32)
Calcium: 9.3 mg/dL (ref 8.4–10.5)
Chloride: 92 mEq/L — ABNORMAL LOW (ref 96–112)
Creatinine, Ser: 8.07 mg/dL — ABNORMAL HIGH (ref 0.50–1.10)
GFR calc Af Amer: 6 mL/min — ABNORMAL LOW (ref >90)
GFR, EST NON AFRICAN AMERICAN: 5 mL/min — AB (ref >90)
GLUCOSE: 190 mg/dL — AB (ref 70–99)
Potassium: 6.6 mEq/L (ref 3.7–5.3)
SODIUM: 135 meq/L — AB (ref 137–147)

## 2013-10-07 LAB — CBC
HCT: 33.5 % — ABNORMAL LOW (ref 36.0–46.0)
HEMOGLOBIN: 10.8 g/dL — AB (ref 12.0–15.0)
MCH: 23.9 pg — ABNORMAL LOW (ref 26.0–34.0)
MCHC: 32.2 g/dL (ref 30.0–36.0)
MCV: 74.1 fL — ABNORMAL LOW (ref 78.0–100.0)
Platelets: 122 10*3/uL — ABNORMAL LOW (ref 150–400)
RBC: 4.52 MIL/uL (ref 3.87–5.11)
RDW: 19.5 % — ABNORMAL HIGH (ref 11.5–15.5)
WBC: 4.3 10*3/uL (ref 4.0–10.5)

## 2013-10-07 LAB — PRO B NATRIURETIC PEPTIDE: Pro B Natriuretic peptide (BNP): 1819 pg/mL — ABNORMAL HIGH (ref 0–125)

## 2013-10-07 LAB — POCT I-STAT TROPONIN I: Troponin i, poc: 0.01 ng/mL (ref 0.00–0.08)

## 2013-10-07 LAB — APTT: aPTT: 28 seconds (ref 24–37)

## 2013-10-07 LAB — CG4 I-STAT (LACTIC ACID): Lactic Acid, Venous: 1.68 mmol/L (ref 0.5–2.2)

## 2013-10-07 MED ORDER — HEPARIN SODIUM (PORCINE) 1000 UNIT/ML DIALYSIS
1000.0000 [IU] | INTRAMUSCULAR | Status: DC | PRN
Start: 2013-10-07 — End: 2013-10-11
  Filled 2013-10-07: qty 1

## 2013-10-07 MED ORDER — SODIUM CHLORIDE 0.9 % IV SOLN: 100.0000 mL | INTRAVENOUS | Status: DC | PRN

## 2013-10-07 MED ORDER — HEPARIN SODIUM (PORCINE) 1000 UNIT/ML DIALYSIS
4000.0000 [IU] | Freq: Once | INTRAMUSCULAR | Status: AC
  Administered 2013-10-07: 4000 [IU] via INTRAVENOUS_CENTRAL
  Filled 2013-10-07: qty 4

## 2013-10-07 MED ORDER — NEPRO/CARBSTEADY PO LIQD
237.0000 mL | ORAL | Status: DC | PRN
  Filled 2013-10-07: qty 237

## 2013-10-07 MED ORDER — LIDOCAINE HCL (PF) 1 % IJ SOLN: 5.0000 mL | INTRAMUSCULAR | Status: DC | PRN

## 2013-10-07 MED ORDER — LIDOCAINE-PRILOCAINE 2.5-2.5 % EX CREA
1.0000 "application " | TOPICAL_CREAM | CUTANEOUS | Status: DC | PRN
  Filled 2013-10-07: qty 5

## 2013-10-07 MED ORDER — ALTEPLASE 2 MG IJ SOLR
2.0000 mg | Freq: Once | INTRAMUSCULAR | Status: AC | PRN
  Filled 2013-10-07: qty 2

## 2013-10-07 MED ORDER — PENTAFLUOROPROP-TETRAFLUOROETH EX AERO
1.0000 "application " | INHALATION_SPRAY | CUTANEOUS | Status: DC | PRN
  Filled 2013-10-07: qty 103.5

## 2013-10-07 NOTE — ED Provider Notes (Signed)
CSN: [id number]     Arrival date & time 10/07/13  [id number] History   First MD Initiated Contact with Patient 10/07/13 [phone]     Chief Complaint  Patient presents with  . Tachycardia  . Shortness of Breath  . Nausea  . Chest Pain   (Consider location/radiation/quality/duration/timing/severity/associated sxs/prior Treatment) HPI  Patient presents after being awakened by palpitations, generalized discomfort. Symptoms began approximately 3 or prior to my evaluation. She states that prior to going to bed the night prior to symptom onset she was in her usual state of health, including completing a course of dialysis yesterday. Since onset symptoms have been persistent, with generalized discomfort, or palpitations, without focal chest pain.  No associated dyspnea. Patient states that she feels as though there is a bubble on the left side of her chest. Per EMS the patient's oxygenation improved with supplemental oxygen.   Past Medical History  Diagnosis Date  . Diabetes mellitus   . Supraventricular tachycardia   . Renal disorder     Hemodialysis T, TH, Sat  . Thyroid disease   . Renal disorder   . Diabetes mellitus without complication   . Hypertension   . Thyroid disorder   . GERD (gastroesophageal reflux disease)   . Neuropathy   . Hypothyroidism   . Peripheral vascular disease   . Anemia   . Hemodialysis patient   . Hyperlipidemia   . SVT (supraventricular tachycardia)   . Sickle cell trait   . Gastroparesis due to DM    Past Surgical History  Procedure Laterality Date  . Abdominal hysterectomy    . Knee surgery      right  . Thyroid surgery    . Thyroidectomy    . Knee arthroplasty    . Cataract extraction, bilateral    . Colonoscopy  07/19/2012    Procedure: COLONOSCOPY;  Surgeon: Beryle Beams, MD;  Location: [place];  Service: Endoscopy;  Laterality: N/A;  . Arteriovenous graft placement Left 2012    Dr. Harrington Challenger  2nd graft   Family History  Problem Relation Age  of Onset  . Diabetes Mother   . Hypertension Mother   . Heart attack Mother   . Diabetes Father   . Hypertension Father    History  Substance Use Topics  . Smoking status: Former Smoker    Types: Cigarettes    Quit date: 09/20/1993  . Smokeless tobacco: Never Used  . Alcohol Use: No   OB History   Grav Para Term Preterm Abortions TAB SAB Ect Mult Living                 Review of Systems  Constitutional:       Per HPI, otherwise negative  HENT:       Per HPI, otherwise negative  Respiratory:       Per HPI, otherwise negative  Cardiovascular:       Per HPI, otherwise negative  Gastrointestinal: Negative for vomiting.  Endocrine:       Negative aside from HPI  Genitourinary:       Neg aside from HPI   Musculoskeletal:       Per HPI, otherwise negative  Skin: Negative.   Neurological: Negative for syncope.    Allergies  Review of patient's allergies indicates no known allergies.  Home Medications   Current Outpatient Rx  Name  Route  Sig  Dispense  Refill  . aspirin 81 MG chewable tablet   Oral   Chew 81  mg by mouth daily.         . cloNIDine (CATAPRES) 0.2 MG tablet   Oral   Take 0.2 mg by mouth 2 (two) times daily. Takes twice daily.  On HD days (MWF), take first dose after HD.         Marland Kitchen clopidogrel (PLAVIX) 75 MG tablet   Oral   Take 75 mg by mouth daily with breakfast.         . esomeprazole (NEXIUM) 40 MG capsule   Oral   Take 40 mg by mouth daily before breakfast.         . HYDROcodone-acetaminophen (NORCO/VICODIN) 5-325 MG per tablet   Oral   Take 1 tablet by mouth every 6 (six) hours as needed.   10 tablet   0   . insulin lispro protamine-lispro (HUMALOG 75/25) (75-25) 100 UNIT/ML SUSP injection   Subcutaneous   Inject 10-30 Units into the skin 3 (three) times daily. Sliding scale.         . labetalol (NORMODYNE) 300 MG tablet   Oral   Take 300 mg by mouth 2 (two) times daily.         Marland Kitchen levothyroxine (SYNTHROID, LEVOTHROID)  125 MCG tablet   Oral   Take 125 mcg by mouth daily before breakfast.          . pravastatin (PRAVACHOL) 40 MG tablet   Oral   Take 40 mg by mouth at bedtime.          . pregabalin (LYRICA) 150 MG capsule   Oral   Take 1 capsule (150 mg total) by mouth at bedtime.   30 capsule   3   . promethazine (PHENERGAN) 25 MG tablet   Oral   Take 25 mg by mouth every 8 (eight) hours as needed for nausea.         . sevelamer carbonate (RENVELA) 800 MG tablet   Oral   Take 2,400 mg by mouth 3 (three) times daily with meals.         . sevelamer carbonate (RENVELA) 800 MG tablet   Oral   Take 1,600 mg by mouth as needed (take with snack).         . traMADol (ULTRAM) 50 MG tablet   Oral   Take 50 mg by mouth every 6 (six) hours as needed for moderate pain.          Ht 5\' 2"  (1.575 m)  Wt 200 lb (90.719 kg)  BMI 36.57 kg/m2  SpO2 % Physical Exam  Nursing note and vitals reviewed. Constitutional: She is oriented to person, place, and time. She appears well-developed and well-nourished. No distress.  HENT:  Head: Normocephalic and atraumatic.  Eyes: Conjunctivae and EOM are normal.  Cardiovascular: Regular rhythm.  Tachycardia present.   Pulmonary/Chest: Effort normal and breath sounds normal. No stridor. No respiratory distress.  Abdominal: She exhibits no distension.  Musculoskeletal: She exhibits no edema.  Right distal forearm in a cast from a prior injury  Neurological: She is alert and oriented to person, place, and time. No cranial nerve deficit.  Skin: Skin is warm and dry.  Psychiatric: She has a normal mood and affect.    ED Course  Procedures (including critical care time) Labs Review Labs Reviewed  CBC  BASIC METABOLIC PANEL  PRO B NATRIURETIC PEPTIDE   Imaging Review No results found.  EKG Interpretation    Date/Time:  Sunday October 07 2013 09:03:48 EST Ventricular Rate:  145  PR Interval:    QRS Duration: 91 QT Interval:  314 QTC  Calculation: 488 R Axis:   68 Text Interpretation:  Age not entered, assumed to be  53 years old for purpose of ECG interpretation Junctional tachycardia Borderline prolonged QT interval Supraventricular tachycardia Abnormal ekg Confirmed by Carmin Muskrat  MD ([id number]) on 10/07/2013 9:13:29 AM           9:21 AM HR 83 - SR - normal  Update: Patient appears better. He has taken substantial effort to obtain labs, IV access.   Update: Patient's labs notable for hyperkalemia, potassium 6.6. Admission, the patient has elevated BNP, 1819.  Repeat ECG w SR  I discussed the patient's case with our nephrologist on call.  With the hyperkalemia, SVT, patient will be emergently provided dialysis.    MDM   Patient presents with palpitations, discomfort, fatigue.  On initial exam the patient is in SVT.  Patient has conversion to SR, following valsalva and other non-invasive manouevers.  However, the patient has notable lab abnormalities, requiring emergent hemodialysis.  With the elevated potassium I discussed her care with our nephrology team and she was taken for HD.  CRITICAL CARE Performed by: Carmin Muskrat Total critical care time: 35 Critical care time was exclusive of separately billable procedures and treating other patients. Critical care was necessary to treat or prevent imminent or life-threatening deterioration. Critical care was time spent personally by me on the following activities: development of treatment plan with patient and/or surrogate as well as nursing, discussions with consultants, evaluation of patient's response to treatment, examination of patient, obtaining history from patient or surrogate, ordering and performing treatments and interventions, ordering and review of laboratory studies, ordering and review of radiographic studies, pulse oximetry and re-evaluation of patient's condition.     Carmin Muskrat, MD 10/07/13 [phone]

## 2013-10-07 NOTE — ED Notes (Addendum)
Labs drawn by Tamala Julian, RRT as requested by Dr Vanita Panda due to unable to obtain IV access or blood draw with this RN or IV team.

## 2013-10-07 NOTE — Progress Notes (Signed)
I was present at this dialysis session, have reviewed the session itself and made  appropriate changes.   53 yo with ESRD, DM, HTN, and hx of SVT, presented to ED with reports of heavy legs and palpitations. She converted with self-maneuvers, but labs came back with K+ 6.6.  Last HD was Friday with no problems. Usual K is not high.  She cannot remember any high K food intake recently.    Exam shows no jvd, alert, clear lungs bilat, RRR no MRG, abd is soft, ext with trace ankle edema bilat.  Neuro is nf, Ox3.   Labs show K 6.6, CO2 25, Creat 8.07.  WBC 4.3, Hb 10.8.  Plt 122. CXR underinflated , no edema.  EKG normal  Assessment: 1. Hyperkalemia 2. ESRD 3. SVT, recurrent; resolved 4. DM 5. HTN  Plan- short HD today x 2 hours, low K bath first hour. OK for discharge after HD, she should go to usual HD tomorrow. Avoid high K foods.  No med changes.   Kelly Splinter MD (pgr) [phone]    ([phone] 10/07/2013, 2:52 PM

## 2013-10-07 NOTE — ED Notes (Signed)
Pt knows that urine is needed

## 2013-10-07 NOTE — ED Notes (Signed)
Repeat EKG handed to [place].

## 2013-10-07 NOTE — ED Notes (Signed)
Lactic Acid 1.16mmol/L copy for results given to Dr. Vanita Panda

## 2013-10-07 NOTE — ED Notes (Signed)
Received pt from home with c/o onset at 0530 with shortness of breath and racing heart that woke her up. Pt reports feels like an air bubble on left side of chest that radiates into abdomen. Pt pulse ox 90% on room air for EMS. Pt placed on 4 L O2 via [place] by EMS pulse ox increased to 98%. Pt reports tightness and nausea in abdomen. Pt is on dialysis MWF. Pt last went to dialysis on Friday.

## 2013-10-07 NOTE — ED Notes (Signed)
IV team at bedside 

## 2013-10-09 ENCOUNTER — Ambulatory Visit (INDEPENDENT_AMBULATORY_CARE_PROVIDER_SITE_OTHER): Payer: Medicare Other

## 2013-10-09 VITALS — BP 150/76 | HR 78 | Resp 16

## 2013-10-09 DIAGNOSIS — E1149 Type 2 diabetes mellitus with other diabetic neurological complication: Secondary | ICD-10-CM

## 2013-10-09 DIAGNOSIS — E1140 Type 2 diabetes mellitus with diabetic neuropathy, unspecified: Secondary | ICD-10-CM

## 2013-10-09 DIAGNOSIS — B351 Tinea unguium: Secondary | ICD-10-CM

## 2013-10-09 DIAGNOSIS — E1142 Type 2 diabetes mellitus with diabetic polyneuropathy: Secondary | ICD-10-CM

## 2013-10-09 DIAGNOSIS — Q828 Other specified congenital malformations of skin: Secondary | ICD-10-CM

## 2013-10-09 DIAGNOSIS — M79609 Pain in unspecified limb: Secondary | ICD-10-CM

## 2013-10-09 LAB — POCT I-STAT, CHEM 8
BUN: 28 mg/dL — ABNORMAL HIGH (ref 6–23)
CREATININE: 4.6 mg/dL — AB (ref 0.50–1.10)
Calcium, Ion: 1.14 mmol/L (ref 1.12–1.23)
Chloride: 98 mEq/L (ref 96–112)
GLUCOSE: 118 mg/dL — AB (ref 70–99)
HCT: 38 % (ref 36.0–46.0)
HEMOGLOBIN: 12.9 g/dL (ref 12.0–15.0)
POTASSIUM: 3.6 meq/L — AB (ref 3.7–5.3)
SODIUM: 141 meq/L (ref 137–147)
TCO2: 28 mmol/L (ref 0–100)

## 2013-10-09 MED ORDER — PREGABALIN 150 MG PO CAPS: 150.0000 mg | ORAL_CAPSULE | Freq: Two times a day (BID) | ORAL | Status: DC

## 2013-10-09 MED ORDER — EFINACONAZOLE 10 % EX SOLN: CUTANEOUS | Status: DC

## 2013-10-09 NOTE — Progress Notes (Signed)
   Subjective:    Patient ID: [name], female    DOB: 27-Oct-1960, 53 y.o.   MRN: [id number]  HPI Comments: "My feet still hurt. Can he split up the Lyrica throughout the day instead of just at night?"  Diabetes   patient is celebrating her thickened hypertrophic friable gratified nails and doing a good job of that however has keratotic lesion sub-fourth MTP area left foot which required debridement this time also is continued concern about the burning and stinging and neuropathy symptoms should last increase her dose of Lyrica.    Review of Systems  Respiratory: Negative.   Genitourinary: Negative.   Musculoskeletal: Positive for arthralgias.  Skin: Negative.   Allergic/Immunologic: Negative.   Neurological: Positive for numbness.  Hematological: Negative.   Psychiatric/Behavioral: Negative.   All other systems reviewed and are negative.   deferred no new changes or findings at this time     Objective:   Physical Exam Vascular status is intact with pedal pulses palpable DP postal for PT plus one over 4 bilateral Refill timed 3-4 seconds all digits nails thick brittle crumbly friable gratified 1 through 5 bilateral patient been self caring patient has keratotic lesion sub-fourth metatarsal digital sulcus area on the left foot which shows no signs of secondary infection no discharge drainage keratoses again plantigrade digital contractures. Neurologically epicritic and proprioceptive sensations decreased on Semmes Weinstein testing to forefoot and digits consistent with diabetic neuropathy. Patient describing paresthesias burning and stinging in her shooting sensations her feet legs. This improve with the Lyrica however still bothers her during the day although thinned helping at night.       Assessment & Plan:  Assessment this time diabetes with peripheral neuropathy. Suggested Lyrica dose to 150 twice a day. Also this time called in a prescription for topical nail antifungal  Jubia to be applied daily to the affected nails for 12 months duration. Patient will continue with self-care for thick mycotic nails debrided keratotic lesion sub-fourth left at this time. Will also obtain authorization for diabetic extra depth shoes in the future as well. Followup in 2-3 months for continued palliative care is needed  Harriet Masson DPM

## 2013-10-09 NOTE — Patient Instructions (Addendum)
Diabetes and Foot Care Diabetes may cause you to have problems because of poor blood supply (circulation) to your feet and legs. This may cause the skin on your feet to become thinner, break easier, and heal more slowly. Your skin may become dry, and the skin may peel and crack. You may also have nerve damage in your legs and feet causing decreased feeling in them. You may not notice minor injuries to your feet that could lead to infections or more serious problems. Taking care of your feet is one of the most important things you can do for yourself.  HOME CARE INSTRUCTIONS  Wear shoes at all times, even in the house. Do not go barefoot. Bare feet are easily injured.  Check your feet daily for blisters, cuts, and redness. If you cannot see the bottom of your feet, use a mirror or ask someone for help.  Wash your feet with warm water (do not use hot water) and mild soap. Then pat your feet and the areas between your toes until they are completely dry. Do not soak your feet as this can dry your skin.  Apply a moisturizing lotion or petroleum jelly (that does not contain alcohol and is unscented) to the skin on your feet and to dry, brittle toenails. Do not apply lotion between your toes.  Trim your toenails straight across. Do not dig under them or around the cuticle. File the edges of your nails with an emery board or nail file.  Do not cut corns or calluses or try to remove them with medicine.  Wear clean socks or stockings every day. Make sure they are not too tight. Do not wear knee-high stockings since they may decrease blood flow to your legs.  Wear shoes that fit properly and have enough cushioning. To break in new shoes, wear them for just a few hours a day. This prevents you from injuring your feet. Always look in your shoes before you put them on to be sure there are no objects inside.  Do not cross your legs. This may decrease the blood flow to your feet.  If you find a minor scrape,  cut, or break in the skin on your feet, keep it and the skin around it clean and dry. These areas may be cleansed with mild soap and water. Do not cleanse the area with peroxide, alcohol, or iodine.  When you remove an adhesive bandage, be sure not to damage the skin around it.  If you have a wound, look at it several times a day to make sure it is healing.  Do not use heating pads or hot water bottles. They may burn your skin. If you have lost feeling in your feet or legs, you may not know it is happening until it is too late.  Make sure your health care provider performs a complete foot exam at least annually or more often if you have foot problems. Report any cuts, sores, or bruises to your health care provider immediately. SEEK MEDICAL CARE IF:   You have an injury that is not healing.  You have cuts or breaks in the skin.  You have an ingrown nail.  You notice redness on your legs or feet.  You feel burning or tingling in your legs or feet.  You have pain or cramps in your legs and feet.  Your legs or feet are numb.  Your feet always feel cold. SEEK IMMEDIATE MEDICAL CARE IF:   There is increasing redness,   swelling, or pain in or around a wound.  There is a red line that goes up your leg.  Pus is coming from a wound.  You develop a fever or as directed by your health care provider.  You notice a bad smell coming from an ulcer or wound. Document Released: 09/03/2000 Document Revised: 05/09/2013 Document Reviewed: 02/13/2013 [facility] Patient Information 2014 [place].   Peripheral Neuropathy Peripheral neuropathy is a type of nerve damage. It affects nerves that carry signals between the spinal cord and other parts of the body. These are called peripheral nerves. With peripheral neuropathy, one nerve or a group of nerves may be damaged.  CAUSES  Many things can damage peripheral nerves. For some people with peripheral neuropathy, the cause is unknown. Some causes  include:  Diabetes. This is the most common cause of peripheral neuropathy.  Injury to a nerve.  Pressure or stress on a nerve that lasts a long time.  Too little vitamin B. Alcoholism can lead to this.  Infections.  Autoimmune diseases, such as multiple sclerosis and systemic lupus erythematosus.  Inherited nerve diseases.  Some medicines, such as cancer drugs.  Toxic substances, such as lead and mercury.  Too little blood flowing to the legs.  Kidney disease.  Thyroid disease. SIGNS AND SYMPTOMS  Different people have different symptoms. The symptoms you have will depend on which of your nerves is damaged. Common symptoms include:  Loss of feeling (numbness) in the feet and hands.  Tingling in the feet and hands.  Pain that burns.  Very sensitive skin.  Weakness.  Not being able to move a part of the body (paralysis).  Muscle twitching.  Clumsiness or poor coordination.  Loss of balance.  Not being able to control your bladder.  Feeling dizzy.  Sexual problems. DIAGNOSIS  Peripheral neuropathy is a symptom, not a disease. Finding the cause of peripheral neuropathy can be hard. To figure that out, your health care provider will take a medical history and do a physical exam. A neurological exam will also be done. This involves checking things affected by your brain, spinal cord, and nerves (nervous system). For example, your health care provider will check your reflexes, how you move, and what you can feel.  Other types of tests may also be ordered, such as:  Blood tests.  A test of the fluid in your spinal cord.  Imaging tests, such as CT scans or an MRI.  Electromyography (EMG). This test checks the nerves that control muscles.  Nerve conduction velocity tests. These tests check how fast messages pass through your nerves.  Nerve biopsy. A small piece of nerve is removed. It is then checked under a microscope. TREATMENT   Medicine is often used to  treat peripheral neuropathy. Medicines may include:  Pain-relieving medicines. Prescription or over-the-counter medicine may be suggested.  Antiseizure medicine. This may be used for pain.  Antidepressants. These also may help ease pain from neuropathy.  Lidocaine. This is a numbing medicine. You might wear a patch or be given a shot.  Mexiletine. This medicine is typically used to help control irregular heart rhythms.  Surgery. Surgery may be needed to relieve pressure on a nerve or to destroy a nerve that is causing pain.  Physical therapy to help movement.  Assistive devices to help movement. HOME CARE INSTRUCTIONS   Only take over-the-counter or prescription medicines as directed by your health care provider. Follow the instructions carefully for any given medicines. Do not take any other  medicines without first getting approval from your health care provider.  If you have diabetes, work closely with your health care provider to keep your blood sugar under control.  If you have numbness in your feet:  Check every day for signs of injury or infection. Watch for redness, warmth, and swelling.  Wear padded socks and comfortable shoes. These help protect your feet.  Do not do things that put pressure on your damaged nerve.  Do not smoke. Smoking keeps blood from getting to damaged nerves.  Avoid or limit alcohol. Too much alcohol can cause a lack of B vitamins. These vitamins are needed for healthy nerves.  Develop a good support system. Coping with peripheral neuropathy can be stressful. Talk to a mental health specialist or join a support group if you are struggling.  Follow up with your health care provider as directed. SEEK MEDICAL CARE IF:   You have new signs or symptoms of peripheral neuropathy.  You are struggling emotionally from dealing with peripheral neuropathy.  You have a fever. SEEK IMMEDIATE MEDICAL CARE IF:   You have an injury or infection that is not  healing.  You feel very dizzy or begin vomiting.  You have chest pain.  You have trouble breathing. Document Released: 08/27/2002 Document Revised: 05/19/2011 Document Reviewed: 05/14/2013 [facility] Patient Information 2014 [place].  Apply nail medication to affected nails once daily for 12 months duration to follow instructions as indicated.

## 2013-12-10 ENCOUNTER — Other Ambulatory Visit: Payer: Self-pay | Admitting: Gastroenterology

## 2013-12-10 DIAGNOSIS — R109 Unspecified abdominal pain: Secondary | ICD-10-CM

## 2013-12-20 ENCOUNTER — Ambulatory Visit (HOSPITAL_COMMUNITY)
Admission: RE | Admit: 2013-12-20 | Discharge: 2013-12-20 | Disposition: A | Discharge status: Home / Self Care | Payer: Medicare Other | Source: Ambulatory Visit | Attending: Gastroenterology | Admitting: Gastroenterology

## 2013-12-20 DIAGNOSIS — R109 Unspecified abdominal pain: Secondary | ICD-10-CM | POA: Insufficient documentation

## 2013-12-20 MED ORDER — TECHNETIUM TC 99M SULFUR COLLOID
2.0000 | Freq: Once | INTRAVENOUS | Status: AC | PRN
  Administered 2013-12-20: 2 via ORAL

## 2013-12-24 IMAGING — CR DG CHEST 1V PORT
1 series · 1 of 1 positions shown · non-contrast
Comparison: Portable chest x-ray of 07/11/2012

CLINICAL DATA: Cardiac arrest, line placement

PORTABLE CHEST - 1 VIEW

[AP]
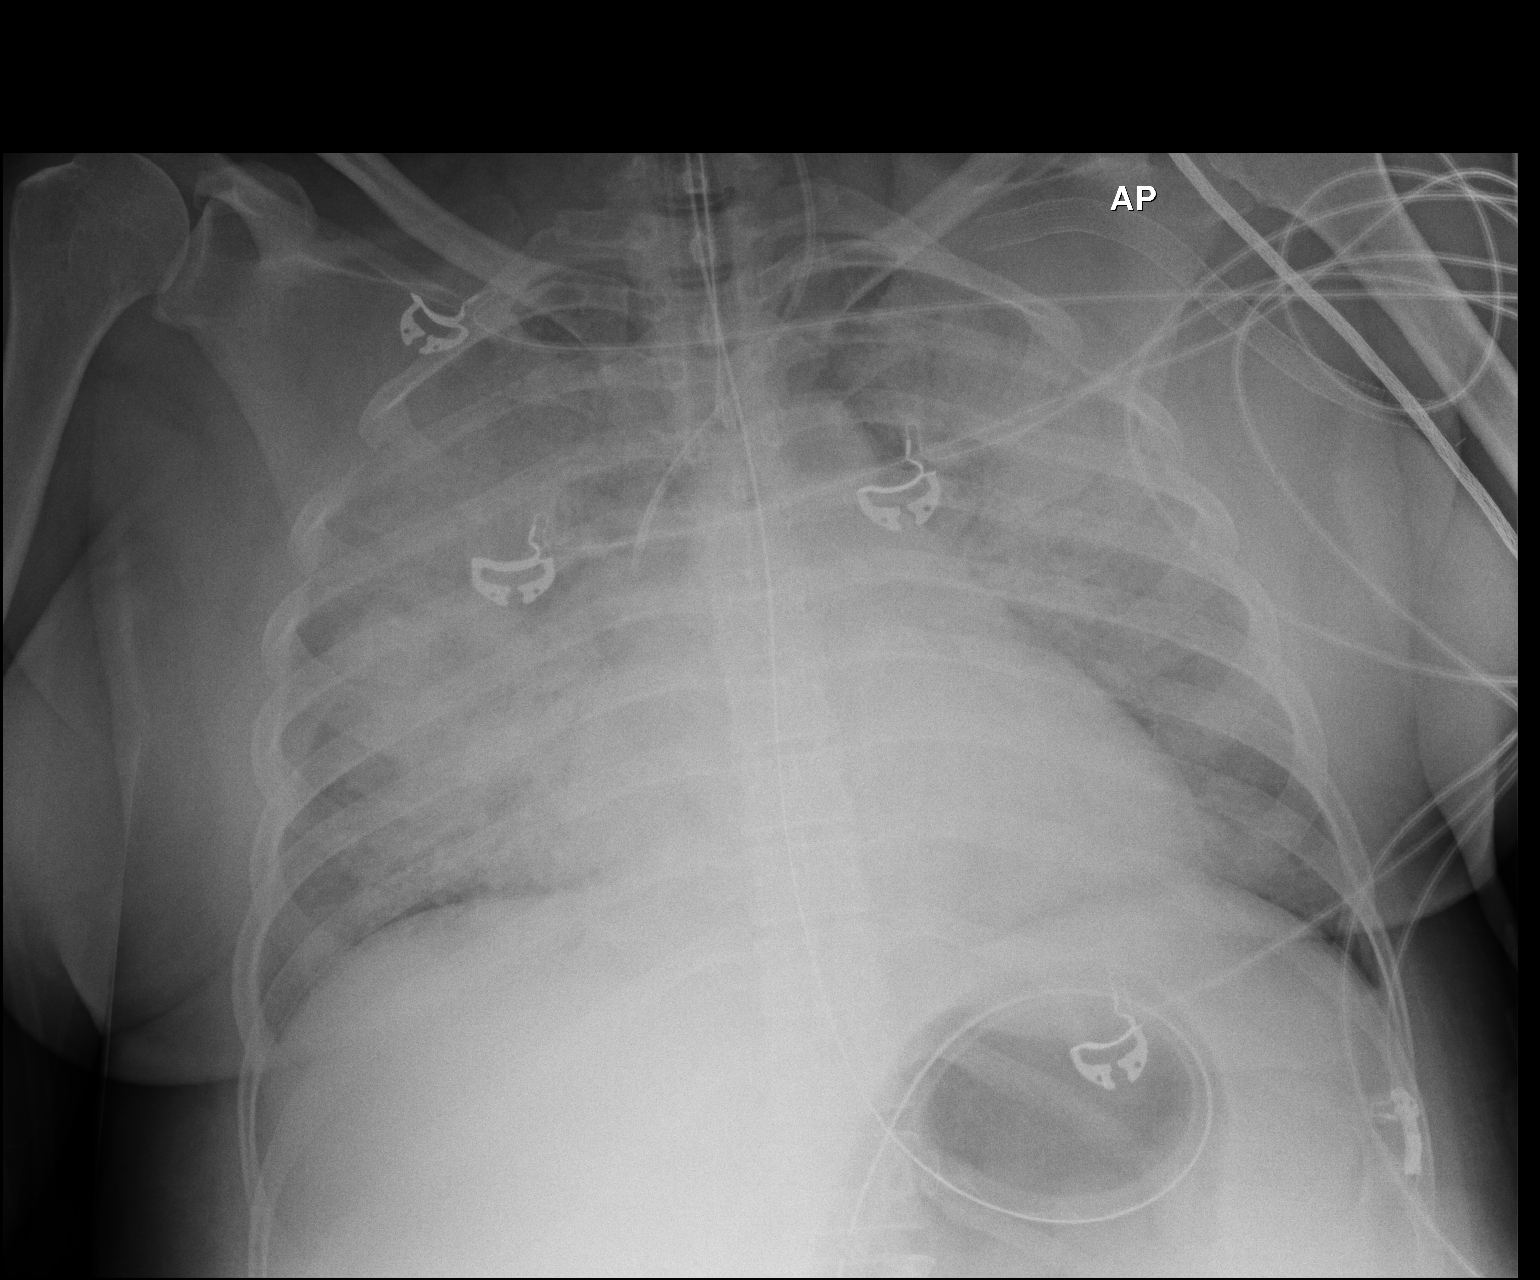

[1 of 1 positions shown; findings below may reference images not displayed]

FINDINGS: There has been worsening of diffuse airspace disease most
consistent with edema.  The tip of endotracheal tube is
approximately 2.5 cm above the carina.  The left IJ central venous
line is present with the tip overlying the upper SVC.  No
pneumothorax is noted.  Cardiomegaly is stable. NG tube coils in
the stomach.
IMPRESSION: 1.  Left IJ central venous line tip overlies the upper SVC.  No
pneumothorax.
2.  Worsening of airspace disease most consistent with edema.
3.  Tip of endotracheal tube 2.5 cm above the carina.

## 2013-12-24 IMAGING — CR DG CHEST 1V PORT
1 series · 1 of 1 positions shown · non-contrast
Comparison: None.

CLINICAL DATA: ETT placement

PORTABLE CHEST - 1 VIEW

[AP]
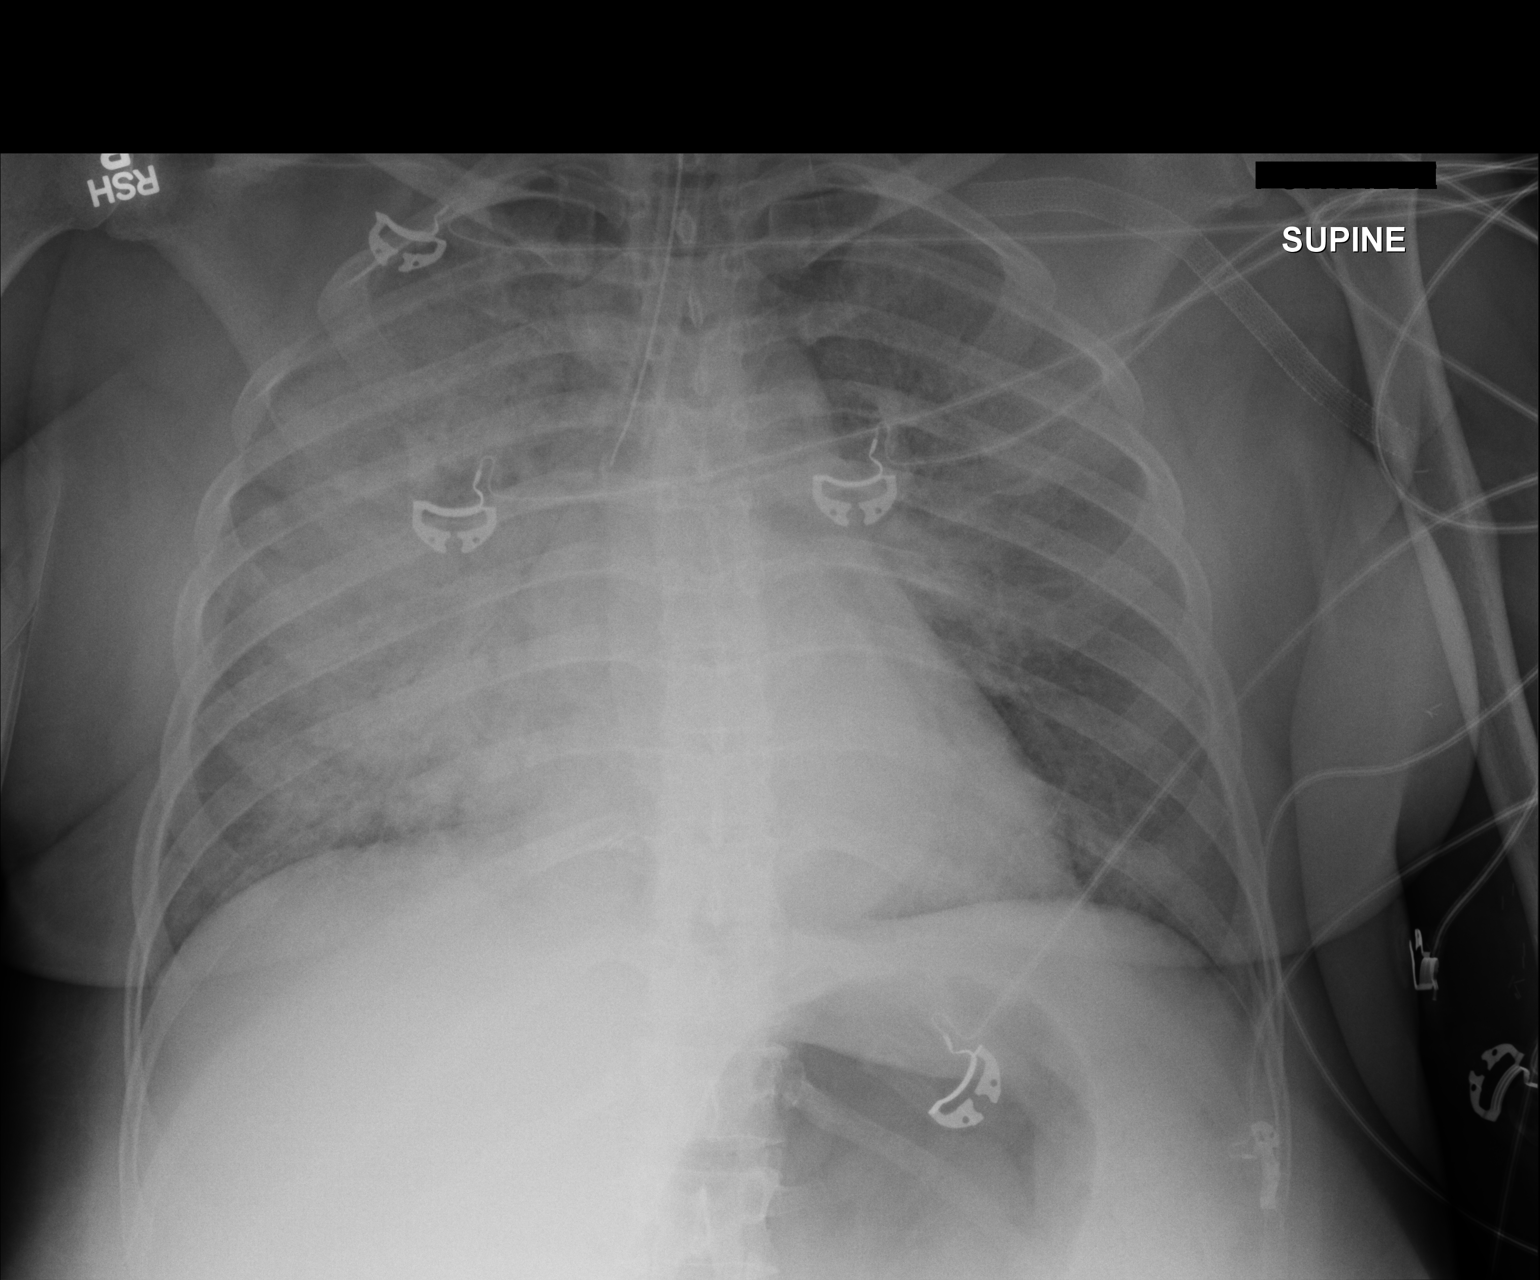

[1 of 1 positions shown; findings below may reference images not displayed]

FINDINGS: Preferential intubation of the right mainstem bronchus.
Withdrawal approximately 4 cm is suggested.

Patchy bilateral perihilar opacities, right greater than left,
suspicious for multifocal infection, less likely edema or pulmonary
hemorrhage.  No pleural effusion or pneumothorax.

The heart is top normal in size.

Stent graft to the left axilla.
IMPRESSION: Preferential intubation of the right mainstem bronchus.  Withdrawal
approximately 4 cm is suggested.

Patchy bilateral perihilar opacities, right greater than left,
suspicious for multifocal infection.

## 2013-12-27 IMAGING — CR DG CHEST 1V PORT
1 series · 1 of 1 positions shown · non-contrast
Comparison: 07/13/2012

CLINICAL DATA: Follow up edema.

PORTABLE CHEST - 1 VIEW

[AP]
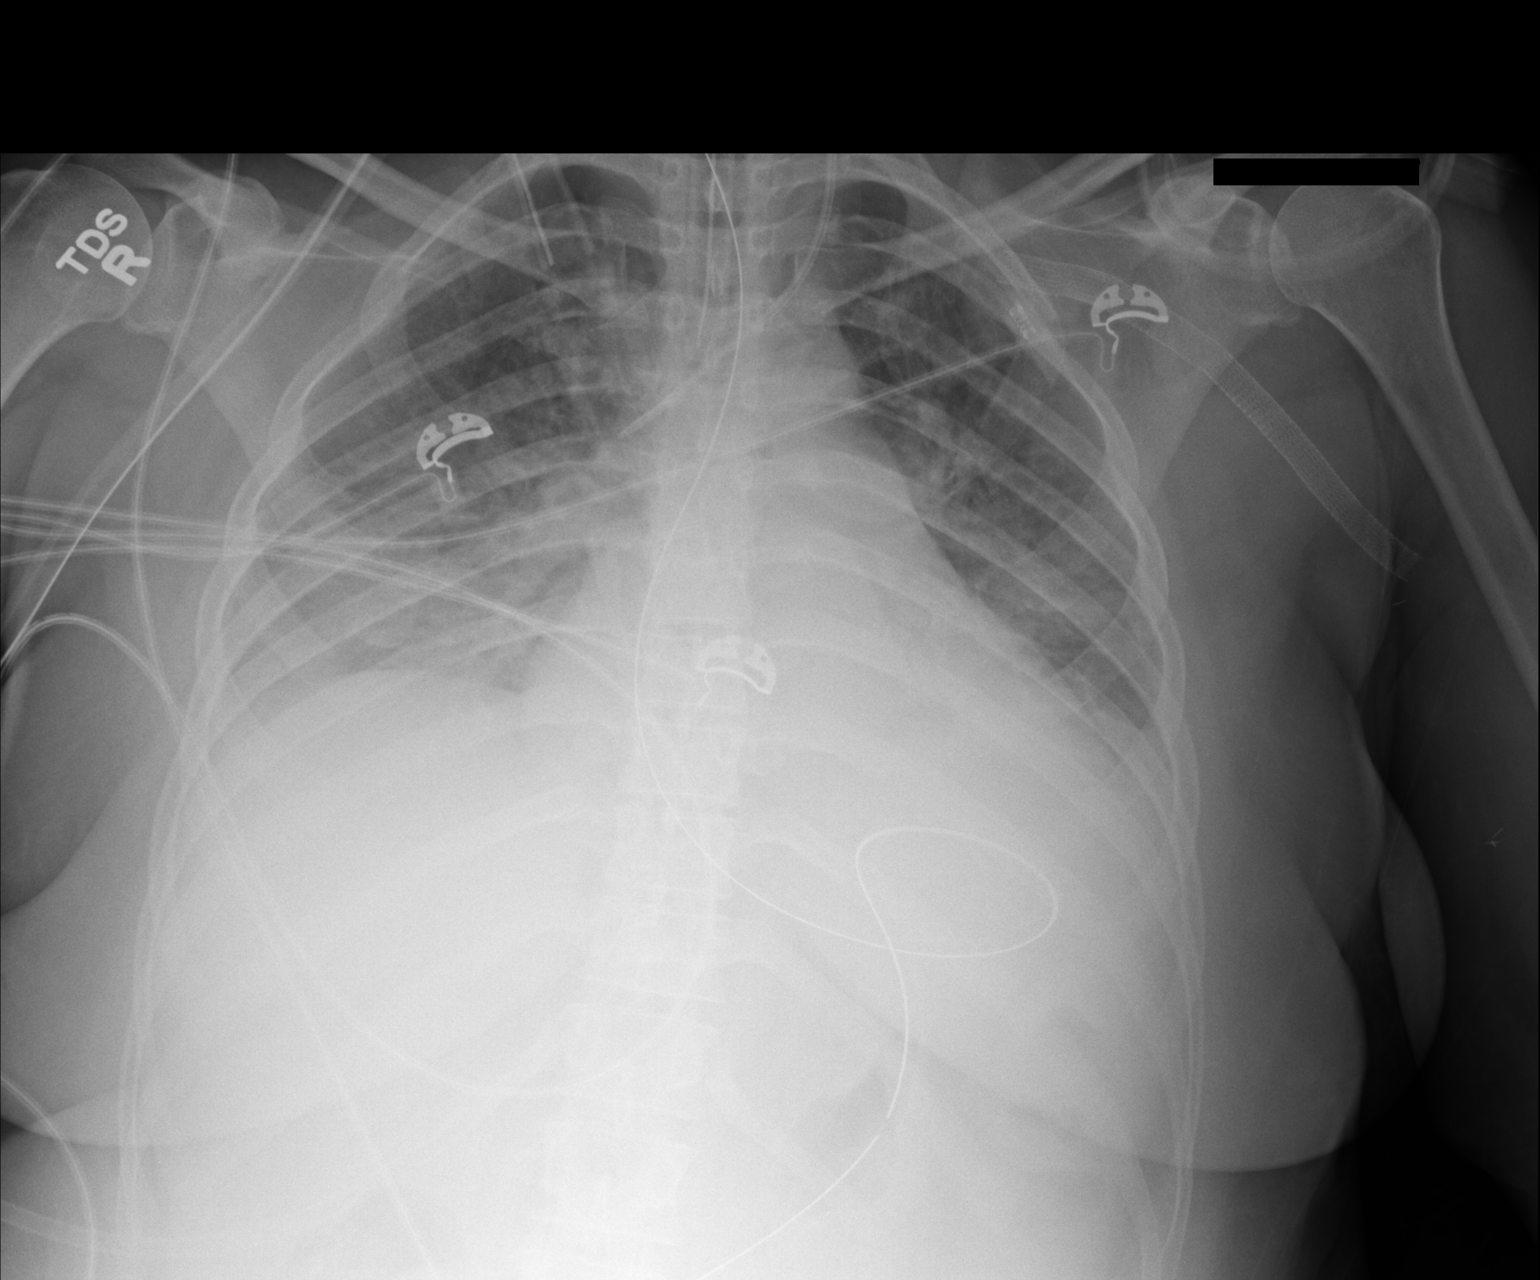

[1 of 1 positions shown; findings below may reference images not displayed]

FINDINGS: Endotracheal tube 3 cm above the carina..  Venous
catheter tip in the SVC and unchanged.  NG in the stomach.  No
pneumothorax

Increase in bilateral airspace disease.  This may represent edema
or pneumonia.  Small pleural effusions are present.
IMPRESSION: Worsening bilateral airspace disease may represent pneumonia or
edema.

## 2014-02-01 ENCOUNTER — Emergency Department (HOSPITAL_COMMUNITY): Payer: Medicare Other

## 2014-02-01 ENCOUNTER — Emergency Department (HOSPITAL_COMMUNITY)
Admission: EM | Admit: 2014-02-01 | Discharge: 2014-02-01 | Disposition: A | Discharge status: Home / Self Care | Payer: Medicare Other | Attending: Emergency Medicine | Admitting: Emergency Medicine

## 2014-02-01 ENCOUNTER — Encounter (HOSPITAL_COMMUNITY): Payer: Self-pay | Admitting: Emergency Medicine

## 2014-02-01 DIAGNOSIS — Z87891 Personal history of nicotine dependence: Secondary | ICD-10-CM | POA: Insufficient documentation

## 2014-02-01 DIAGNOSIS — I498 Other specified cardiac arrhythmias: Secondary | ICD-10-CM | POA: Insufficient documentation

## 2014-02-01 DIAGNOSIS — Z7982 Long term (current) use of aspirin: Secondary | ICD-10-CM | POA: Insufficient documentation

## 2014-02-01 DIAGNOSIS — E039 Hypothyroidism, unspecified: Secondary | ICD-10-CM | POA: Insufficient documentation

## 2014-02-01 DIAGNOSIS — K3184 Gastroparesis: Secondary | ICD-10-CM | POA: Insufficient documentation

## 2014-02-01 DIAGNOSIS — I120 Hypertensive chronic kidney disease with stage 5 chronic kidney disease or end stage renal disease: Secondary | ICD-10-CM | POA: Insufficient documentation

## 2014-02-01 DIAGNOSIS — Z862 Personal history of diseases of the blood and blood-forming organs and certain disorders involving the immune mechanism: Secondary | ICD-10-CM | POA: Insufficient documentation

## 2014-02-01 DIAGNOSIS — E1149 Type 2 diabetes mellitus with other diabetic neurological complication: Secondary | ICD-10-CM | POA: Insufficient documentation

## 2014-02-01 DIAGNOSIS — N186 End stage renal disease: Secondary | ICD-10-CM | POA: Insufficient documentation

## 2014-02-01 DIAGNOSIS — K219 Gastro-esophageal reflux disease without esophagitis: Secondary | ICD-10-CM | POA: Insufficient documentation

## 2014-02-01 DIAGNOSIS — I471 Supraventricular tachycardia: Secondary | ICD-10-CM

## 2014-02-01 DIAGNOSIS — Z794 Long term (current) use of insulin: Secondary | ICD-10-CM | POA: Insufficient documentation

## 2014-02-01 DIAGNOSIS — Z992 Dependence on renal dialysis: Secondary | ICD-10-CM | POA: Insufficient documentation

## 2014-02-01 DIAGNOSIS — Z79899 Other long term (current) drug therapy: Secondary | ICD-10-CM | POA: Insufficient documentation

## 2014-02-01 DIAGNOSIS — E785 Hyperlipidemia, unspecified: Secondary | ICD-10-CM | POA: Insufficient documentation

## 2014-02-01 DIAGNOSIS — Z8669 Personal history of other diseases of the nervous system and sense organs: Secondary | ICD-10-CM | POA: Insufficient documentation

## 2014-02-01 LAB — I-STAT CHEM 8, ED
BUN: 18 mg/dL (ref 6–23)
CHLORIDE: 98 meq/L (ref 96–112)
CREATININE: 4 mg/dL — AB (ref 0.50–1.10)
Calcium, Ion: 1.07 mmol/L — ABNORMAL LOW (ref 1.12–1.23)
Glucose, Bld: 168 mg/dL — ABNORMAL HIGH (ref 70–99)
HCT: 44 % (ref 36.0–46.0)
Hemoglobin: 15 g/dL (ref 12.0–15.0)
POTASSIUM: 4.5 meq/L (ref 3.7–5.3)
Sodium: 140 mEq/L (ref 137–147)
TCO2: 31 mmol/L (ref 0–100)

## 2014-02-01 LAB — I-STAT TROPONIN, ED: Troponin i, poc: 0.01 ng/mL (ref 0.00–0.08)

## 2014-02-01 NOTE — ED Notes (Signed)
IV team will come as soon as possible to de-access pt's fistula.

## 2014-02-01 NOTE — ED Notes (Signed)
Per EMS - pt was at dialysis, just finished her treatment. C/o sob that started around 4am this morning. Pt was in SVT HR 160s. EMS administered adenosine 6mg , didn't do anything, administered 12 mg brought HR down to 90s. Pt reports sob is now gone.  12 lead unremarkable.  BP 179/67 HR 90 RR 18. ems unable to obtain PIV so they accessed her fistula in left upper arm.

## 2014-02-01 NOTE — ED Notes (Signed)
Pt currently denies sob/cp.

## 2014-02-01 NOTE — Progress Notes (Signed)
Deaccessed fistula;no bleeding noted,pressure dressing applied.

## 2014-02-01 NOTE — ED Notes (Signed)
Paged IV team to come de-access pt's fistula.

## 2014-02-01 NOTE — ED Provider Notes (Signed)
CSN: [id number]     Arrival date & time 02/01/14  1213 History   First MD Initiated Contact with Patient 02/01/14 1214     Chief Complaint  Patient presents with  . Tachycardia     (Consider location/radiation/quality/duration/timing/severity/associated sxs/prior Treatment) HPI  53 y.o. Female with tachycardia this am prior to dialysis. She had dialysis and then they called ems.  EMS obtained prehospital ekg significant for svt and then gave adenosine prehospital with conversion.  Patient states she has had similar episodes in the past.  She denies chest pain but had some dyspnea after rapid heart rate began.  She is currently asymptomatic after being converted.  Past Medical History  Diagnosis Date  . Diabetes mellitus   . Supraventricular tachycardia   . Renal disorder     Hemodialysis T, TH, Sat  . Thyroid disease   . Renal disorder   . Diabetes mellitus without complication   . Hypertension   . Thyroid disorder   . GERD (gastroesophageal reflux disease)   . Neuropathy   . Hypothyroidism   . Peripheral vascular disease   . Anemia   . Hemodialysis patient   . Hyperlipidemia   . SVT (supraventricular tachycardia)   . Sickle cell trait   . Gastroparesis due to DM    Past Surgical History  Procedure Laterality Date  . Abdominal hysterectomy    . Knee surgery      right  . Thyroid surgery    . Thyroidectomy    . Knee arthroplasty    . Cataract extraction, bilateral    . Colonoscopy  07/19/2012    Procedure: COLONOSCOPY;  Surgeon: Beryle Beams, MD;  Location: [place] [facility];  Service: Endoscopy;  Laterality: N/A;  . Arteriovenous graft placement Left 2012    Dr. Harrington Challenger  2nd graft   Family History  Problem Relation Age of Onset  . Diabetes Mother   . Hypertension Mother   . Heart attack Mother   . Diabetes Father   . Hypertension Father    History  Substance Use Topics  . Smoking status: Former Smoker    Types: Cigarettes    Quit date: 09/20/1993  . Smokeless  tobacco: Never Used  . Alcohol Use: No   OB History   Grav Para Term Preterm Abortions TAB SAB Ect Mult Living                 Review of Systems  All other systems reviewed and are negative.     Allergies  Review of patient's allergies indicates no known allergies.  Home Medications   Prior to Admission medications   Medication Sig Start Date End Date Taking? Authorizing Provider  aspirin 81 MG chewable tablet Chew 81 mg by mouth daily.   Yes Historical Provider, MD  cloNIDine (CATAPRES) 0.2 MG tablet Take 0.2 mg by mouth 2 (two) times daily. Takes twice daily.  On HD days (MWF), take first dose after HD.   Yes Historical Provider, MD  Efinaconazole (JUBLIA) 10 % SOLN Apply one drop daily to affected nails as instructed 10/09/13  Yes Harriet Masson, DPM  esomeprazole (NEXIUM) 40 MG capsule Take 40 mg by mouth daily before breakfast.   Yes Historical Provider, MD  insulin lispro protamine-lispro (HUMALOG 75/25) (75-25) 100 UNIT/ML SUSP injection Inject 10-30 Units into the skin 3 (three) times daily. Sliding scale.   Yes Historical Provider, MD  labetalol (NORMODYNE) 300 MG tablet Take 300 mg by mouth 2 (two) times daily.   Yes  Historical Provider, MD  levothyroxine (SYNTHROID, LEVOTHROID) 125 MCG tablet Take 125 mcg by mouth daily before breakfast.    Yes Historical Provider, MD  NIFEdipine (PROCARDIA XL/ADALAT-CC) 90 MG 24 hr tablet Take 90 mg by mouth daily.   Yes Historical Provider, MD  oxyCODONE-acetaminophen (PERCOCET) 10-325 MG per tablet Take 1 tablet by mouth every 4 (four) hours as needed for pain.   Yes Historical Provider, MD  pravastatin (PRAVACHOL) 40 MG tablet Take 40 mg by mouth at bedtime.    Yes Historical Provider, MD  pregabalin (LYRICA) 150 MG capsule Take 1 capsule (150 mg total) by mouth 2 (two) times daily. 10/09/13  Yes Richard Blenda Mounts, DPM  promethazine (PHENERGAN) 25 MG tablet Take 25 mg by mouth every 8 (eight) hours as needed for nausea.   Yes Historical  Provider, MD  sevelamer carbonate (RENVELA) 800 MG tablet Take 2,400 mg by mouth 3 (three) times daily with meals.   Yes Historical Provider, MD  sevelamer carbonate (RENVELA) 800 MG tablet Take 1,600 mg by mouth as needed (take with snack).   Yes Historical Provider, MD   BP 110/48  Pulse 80  Temp(Src) 97.8 F (36.6 C) (Oral)  Resp 13  SpO2 94% Physical Exam  Nursing note and vitals reviewed. Constitutional: She is oriented to person, place, and time. She appears well-developed and well-nourished.  HENT:  Head: Normocephalic and atraumatic.  Right Ear: External ear normal.  Left Ear: External ear normal.  Nose: Nose normal.  Mouth/Throat: Oropharynx is clear and moist.  Eyes: Conjunctivae and EOM are normal. Pupils are equal, round, and reactive to light.  Neck: Normal range of motion. Neck supple.  Cardiovascular: Normal rate, regular rhythm, normal heart sounds and intact distal pulses.   Pulmonary/Chest: Effort normal and breath sounds normal.  Abdominal: Soft. Bowel sounds are normal.  Musculoskeletal: Normal range of motion.  Neurological: She is alert and oriented to person, place, and time. She has normal reflexes.  Skin: Skin is warm and dry.  Psychiatric: She has a normal mood and affect. Her behavior is normal. Thought content normal.    ED Course  Procedures (including critical care time) Labs Review Labs Reviewed  I-STAT CHEM 8, ED - Abnormal; Notable for the following:    Creatinine, Ser 4.00 (*)    Glucose, Bld 168 (*)    Calcium, Ion 1.07 (*)    All other components within normal limits  Randolm Idol, ED    Imaging Review Dg Chest 2 View  02/01/2014   CLINICAL DATA:  Supraventricular tachycardia  EXAM: CHEST  2 VIEW  COMPARISON:  October 07, 2013  FINDINGS: There is mild subsegmental atelectasis in both mid lungs. There is no edema or consolidation. Heart is mildly enlarged with normal pulmonary vascularity. No adenopathy. No bone lesions.  IMPRESSION:  Mild atelectatic change bilaterally. Mild cardiac enlargement. No edema or consolidation.   Electronically Signed   By: Lowella Grip M.D.   On: 02/01/2014 14:28     EKG Interpretation   Date/Time:  Friday Feb 01 2014 12:15:36 EDT Ventricular Rate:  87 PR Interval:  152 QRS Duration: 79 QT Interval:  385 QTC Calculation: 463 R Axis:   49 Text Interpretation:  Normal sinus rhythm Normal ECG Confirmed by Miangel Flom MD,  Andee Poles [id number]) on 02/01/2014 12:31:37 PM      MDM   Final diagnoses:  None    Patient observed for several hours without recurrence of symptoms.  Patient discharge to follow up with pmd. Return precautions  given and patient voices understanding.     Shaune Pollack, MD 02/01/14 [phone]

## 2014-02-01 NOTE — ED Notes (Signed)
IV team at bedside 

## 2014-08-20 ENCOUNTER — Other Ambulatory Visit: Payer: Self-pay | Admitting: *Deleted

## 2014-08-20 NOTE — Telephone Encounter (Signed)
[facility] sent over a refill request for Lyrica.  Dr. Noralyn Pick the refill plus one additional refill but stated no more refills after this.  Patient will need to be seen for follow-up.

## 2014-08-29 ENCOUNTER — Encounter (HOSPITAL_COMMUNITY): Payer: Self-pay | Admitting: Surgery

## 2014-09-25 ENCOUNTER — Emergency Department (HOSPITAL_COMMUNITY)
Admission: EM | Admit: 2014-09-25 | Discharge: 2014-09-25 | Disposition: A | Discharge status: Home / Self Care | Payer: Medicare Other | Attending: Emergency Medicine | Admitting: Emergency Medicine

## 2014-09-25 ENCOUNTER — Encounter (HOSPITAL_COMMUNITY): Payer: Self-pay | Admitting: *Deleted

## 2014-09-25 DIAGNOSIS — I120 Hypertensive chronic kidney disease with stage 5 chronic kidney disease or end stage renal disease: Secondary | ICD-10-CM | POA: Insufficient documentation

## 2014-09-25 DIAGNOSIS — Z862 Personal history of diseases of the blood and blood-forming organs and certain disorders involving the immune mechanism: Secondary | ICD-10-CM | POA: Diagnosis not present

## 2014-09-25 DIAGNOSIS — Z79899 Other long term (current) drug therapy: Secondary | ICD-10-CM | POA: Diagnosis not present

## 2014-09-25 DIAGNOSIS — E039 Hypothyroidism, unspecified: Secondary | ICD-10-CM | POA: Insufficient documentation

## 2014-09-25 DIAGNOSIS — N186 End stage renal disease: Secondary | ICD-10-CM | POA: Insufficient documentation

## 2014-09-25 DIAGNOSIS — T82838A Hemorrhage of vascular prosthetic devices, implants and grafts, initial encounter: Secondary | ICD-10-CM | POA: Diagnosis not present

## 2014-09-25 DIAGNOSIS — Z7982 Long term (current) use of aspirin: Secondary | ICD-10-CM | POA: Diagnosis not present

## 2014-09-25 DIAGNOSIS — Y841 Kidney dialysis as the cause of abnormal reaction of the patient, or of later complication, without mention of misadventure at the time of the procedure: Secondary | ICD-10-CM | POA: Insufficient documentation

## 2014-09-25 DIAGNOSIS — Z87891 Personal history of nicotine dependence: Secondary | ICD-10-CM | POA: Insufficient documentation

## 2014-09-25 DIAGNOSIS — K219 Gastro-esophageal reflux disease without esophagitis: Secondary | ICD-10-CM | POA: Diagnosis not present

## 2014-09-25 DIAGNOSIS — G629 Polyneuropathy, unspecified: Secondary | ICD-10-CM | POA: Insufficient documentation

## 2014-09-25 DIAGNOSIS — E1143 Type 2 diabetes mellitus with diabetic autonomic (poly)neuropathy: Secondary | ICD-10-CM | POA: Insufficient documentation

## 2014-09-25 DIAGNOSIS — Z794 Long term (current) use of insulin: Secondary | ICD-10-CM | POA: Diagnosis not present

## 2014-09-25 DIAGNOSIS — T82898A Other specified complication of vascular prosthetic devices, implants and grafts, initial encounter: Secondary | ICD-10-CM | POA: Diagnosis not present

## 2014-09-25 LAB — BASIC METABOLIC PANEL
Anion gap: 12 (ref 5–15)
BUN: 15 mg/dL (ref 6–23)
CO2: 31 mmol/L (ref 19–32)
CREATININE: 3.82 mg/dL — AB (ref 0.50–1.10)
Calcium: 9 mg/dL (ref 8.4–10.5)
Chloride: 94 mEq/L — ABNORMAL LOW (ref 96–112)
GFR calc Af Amer: 14 mL/min — ABNORMAL LOW (ref >90)
GFR, EST NON AFRICAN AMERICAN: 12 mL/min — AB (ref >90)
GLUCOSE: 130 mg/dL — AB (ref 70–99)
Potassium: 4 mmol/L (ref 3.5–5.1)
SODIUM: 137 mmol/L (ref 135–145)

## 2014-09-25 LAB — TROPONIN I: Troponin I: <0.03 ng/mL (ref <0.031)

## 2014-09-25 LAB — CBC WITH DIFFERENTIAL/PLATELET
BASOS PCT: 1 % (ref 0–1)
Basophils Absolute: 0 10*3/uL (ref 0.0–0.1)
EOS ABS: 0.1 10*3/uL (ref 0.0–0.7)
EOS PCT: 2 % (ref 0–5)
HCT: 39.3 % (ref 36.0–46.0)
HEMOGLOBIN: 12.7 g/dL (ref 12.0–15.0)
LYMPHS PCT: 43 % (ref 12–46)
Lymphs Abs: 1.8 10*3/uL (ref 0.7–4.0)
MCH: 25.1 pg — AB (ref 26.0–34.0)
MCHC: 32.3 g/dL (ref 30.0–36.0)
MCV: 77.7 fL — AB (ref 78.0–100.0)
MONOS PCT: 11 % (ref 3–12)
Monocytes Absolute: 0.5 10*3/uL (ref 0.1–1.0)
Neutro Abs: 1.8 10*3/uL (ref 1.7–7.7)
Neutrophils Relative %: 43 % (ref 43–77)
Platelets: 92 10*3/uL — ABNORMAL LOW (ref 150–400)
RBC: 5.06 MIL/uL (ref 3.87–5.11)
RDW: 20.1 % — ABNORMAL HIGH (ref 11.5–15.5)
WBC: 4.1 10*3/uL (ref 4.0–10.5)

## 2014-09-25 LAB — PROTIME-INR
INR: 1.06 (ref 0.00–1.49)
Prothrombin Time: 13.9 seconds (ref 11.6–15.2)

## 2014-09-25 MED ORDER — LIDOCAINE-EPINEPHRINE (PF) 2 %-1:200000 IJ SOLN
20.0000 mL | Freq: Once | INTRAMUSCULAR | Status: AC
  Administered 2014-09-25: 10 mL
  Filled 2014-09-25: qty 20

## 2014-09-25 NOTE — ED Notes (Signed)
Bleeding controlled to L upper arm dialysis graft. Will continue to monitor. Vital signs stable. No signs of distress noted at the time.

## 2014-09-25 NOTE — Progress Notes (Signed)
Patient ID: [name], female   DOB: 22-Jun-1961, 54 y.o.   MRN: [id number] Patient resisted emergency room today with bleeding from her left upper arm AV access following hemodialysis. She dialyzed successfully on Tuesday and then had repeat dialysis today due to being off of her usual Monday Wednesday Friday schedule. On the leaving dialysis she had continued bleeding from the puncture site in the lower of the 2 upper arm punctures. She does report that occasionally she has had some bruising after hemodialysis but does never had a significant bleed requiring presentation to the emergency department. She has had the great deal difficulty prior accesses in both her right and left arm. But has been having successful hemodialysis with this graft.  Past Medical History  Diagnosis Date  . Diabetes mellitus   . Supraventricular tachycardia   . Renal disorder     Hemodialysis T, TH, Sat  . Thyroid disease   . Renal disorder   . Diabetes mellitus without complication   . Hypertension   . Thyroid disorder   . GERD (gastroesophageal reflux disease)   . Neuropathy   . Hypothyroidism   . Peripheral vascular disease   . Anemia   . Hemodialysis patient   . Hyperlipidemia   . SVT (supraventricular tachycardia)   . Sickle cell trait   . Gastroparesis due to DM     History  Substance Use Topics  . Smoking status: Former Smoker    Types: Cigarettes    Quit date: 09/20/1993  . Smokeless tobacco: Never Used  . Alcohol Use: No    Family History  Problem Relation Age of Onset  . Diabetes Mother   . Hypertension Mother   . Heart attack Mother   . Diabetes Father   . Hypertension Father     No Known Allergies  Current facility-administered medications: lidocaine-EPINEPHrine (XYLOCAINE W/EPI) 2 %-1:200000 (PF) injection 20 mL, 20 mL, Infiltration, Once, Ezequiel Essex, MD Current outpatient prescriptions: aspirin 81 MG chewable tablet, Chew 81 mg by mouth daily., Disp: , Rfl: ;  cloNIDine  (CATAPRES) 0.2 MG tablet, Take 0.2 mg by mouth 2 (two) times daily. Takes twice daily.  On HD days (MWF), take first dose after HD., Disp: , Rfl: ;  Efinaconazole (JUBLIA) 10 % SOLN, Apply one drop daily to affected nails as instructed, Disp: 4 mL, Rfl: 10 esomeprazole (NEXIUM) 40 MG capsule, Take 40 mg by mouth daily before breakfast., Disp: , Rfl: ;  insulin lispro protamine-lispro (HUMALOG 75/25) (75-25) 100 UNIT/ML SUSP injection, Inject 10-30 Units into the skin 3 (three) times daily. Sliding scale., Disp: , Rfl: ;  labetalol (NORMODYNE) 300 MG tablet, Take 300 mg by mouth 2 (two) times daily., Disp: , Rfl:  levothyroxine (SYNTHROID, LEVOTHROID) 125 MCG tablet, Take 125 mcg by mouth daily before breakfast. , Disp: , Rfl: ;  NIFEdipine (PROCARDIA XL/ADALAT-CC) 90 MG 24 hr tablet, Take 90 mg by mouth daily., Disp: , Rfl: ;  oxyCODONE-acetaminophen (PERCOCET) 10-325 MG per tablet, Take 1 tablet by mouth every 4 (four) hours as needed for pain., Disp: , Rfl: ;  pravastatin (PRAVACHOL) 40 MG tablet, Take 40 mg by mouth at bedtime. , Disp: , Rfl:  pregabalin (LYRICA) 150 MG capsule, Take 1 capsule (150 mg total) by mouth 2 (two) times daily., Disp: 60 capsule, Rfl: 5;  promethazine (PHENERGAN) 25 MG tablet, Take 25 mg by mouth every 8 (eight) hours as needed for nausea., Disp: , Rfl: ;  sevelamer carbonate (RENVELA) 800 MG tablet, Take 2,400  mg by mouth 3 (three) times daily with meals., Disp: , Rfl:  sevelamer carbonate (RENVELA) 800 MG tablet, Take 1,600 mg by mouth as needed (take with snack)., Disp: , Rfl:   BP 183/89 mmHg  Pulse 71  Temp(Src) 97.8 F (36.6 C) (Oral)  Resp 14  SpO2 98%  There is no weight on file to calculate BMI.       Physical exam she is awake alert in no distress. Does have a palpable left radial pulse. She did have a hemostatic clamp in place over this with no active bleeding. I compressed her access proximal to the bleeding site with manual compression and remove the  hemostatic clamp. She had sealed the bleeding site and did not have any active bleeding ongoing. She did not have any evidence of skin breakdown and did not have any evidence of false aneurysm formation at this area. She does have a relatively limited access sites has had multiple punctures in this area.  Impression and plan persistent bleeding which is now stopped following access for hemodialysis today. She will dialyze again normally on Friday in 48 hours. She will be observed in the emergency room for additional period to make sure that she does not have any recurrent bleeding and if so will be discharged to home. She will follow up with our service on a as-needed basis

## 2014-09-25 NOTE — ED Notes (Signed)
Bleeding noted from L upper arm dialysis graft. Pt states bleeding started after she finished HD treatment this morning. Active bleeding noted at the time, pressure dressing applied. Pt denies pain. She is alert and oriented x4.

## 2014-09-25 NOTE — ED Notes (Signed)
Per EMS- pt began having bleeding from her graft after dialysis puylled her needles today. Graft was secured at center with a clamp.pt also had a episode of SVT at treatment. Pt has hx of same approx 6 months ago. Pt was coached down and received no medications. Pt was hypertensive as well. States "i have my meds in my pocket"

## 2014-09-25 NOTE — Discharge Instructions (Signed)
Dialysis Vascular Access Malfunction Hold pressure for 30 minutes if bleeding recurs. If bleeding persists, return to the ED or if you develop new or worsening symptmos. A vascular access is an entrance to your blood vessels that can be used for dialysis. A vascular access can be made in one of several ways:   Joining an artery to a vein under your skin to make a bigger blood vessel called a fistula.   Joining an artery to a vein under your skin using a soft tube called a graft.   Placing a thin, flexible tube (catheter) in a large vein, usually in your neck.  A vascular access may malfunction or become blocked.  WHAT CAN CAUSE YOUR VASCULAR ACCESS TO MALFUNCTION?  Infection (common).   A blood clot inside a part of the fistula, graft, or catheter. A blood clot can completely or partially block the flow of blood.   A kink in the graft or catheter.   A collection of blood (called a hematoma or bruise) next to the graft or catheter that pushes against it, blocking the flow of blood.  WHAT ARE SIGNS AND SYMPTOMS OF VASCULAR ACCESS MALFUNCTION?  There is a change in the vibration or pulse of your fistula or graft.  The vibration or pulse of your fistula or graft is gone.   There is new or unusual swelling of the area around the access.   There was an unsuccessful puncture of your access by the dialysis team.   The flow of blood through the fistula, graft, or catheter is too slow for effective dialysis.   When routine dialysis is completed and the needle is removed, bleeding lasts for too long a time.  WHAT HAPPENS IF MY VASCULAR ACCESS MALFUNCTIONS? Your health care provider may order blood work, cultures, or an X-ray test in order to learn what may be wrong with your vascular access. The X-ray test involves the injection of a liquid into the vascular access. The liquid shows up on the X-ray and allows your health care provider to see if there is a blockage in the vascular  access.  Treatment varies depending on the cause of the malfunction:   If the vascular access is infected, your health care provider may prescribe antibiotic medicine to control the infection.   If a clot is found in the vascular access, you may need surgery to remove the clot.   If a blockage in the vascular access is due to some other cause (such as a kink in a graft), then you will likely need surgery to unblock or replace the graft.  HOME CARE INSTRUCTIONS: Follow up with your surgeon or other health care provider if you were instructed to do so. This is very important. Any delay in follow-up could cause permanent dysfunction of the vascular access, which may be dangerous.  SEEK MEDICAL CARE IF:   Fever develops.   Swelling and pain around the vascular access gets worse or new pain develops.  Pain, numbness, or an unusual pale skin color develops in the hand on the side of your vascular access. SEEK IMMEDIATE MEDICAL CARE IF: Unusual bleeding develops at the location of the vascular access. MAKE SURE YOU:  Understand these instructions.  Will watch your condition.  Will get help right away if you are not doing well or get worse. Document Released: 08/09/2006 Document Revised: 01/21/2014 Document Reviewed: 02/08/2013 [facility] Patient Information 2015 [place], [place]. This information is not intended to replace advice given to you by  your health care provider. Make sure you discuss any questions you have with your health care provider. ° °

## 2014-09-25 NOTE — ED Provider Notes (Signed)
CSN: [id number]     Arrival date & time 09/25/14  1204 History   First MD Initiated Contact with Patient 09/25/14 1222     Chief Complaint  Patient presents with  . Vascular Access Problem     (Consider location/radiation/quality/duration/timing/severity/associated sxs/prior Treatment) HPI Comments: Patient from dialysis Center with bleeding from left upper arm graft since being D accessed today. Pressure has been held for the past 40 minutes. Patient is on aspirin but no other anticoagulants. She denies any chest pain, shortness of breath, dizziness or lightheadedness. States compliance with dialysis regimen. Had episode of palpitations during dialysis consistent with her previous episodes of SVT. Now resolved. States she feels fine. No focal weakness, numbness or tingling.  The history is provided by the patient and the EMS personnel. The history is limited by the condition of the patient.    Past Medical History  Diagnosis Date  . Diabetes mellitus   . Supraventricular tachycardia   . Renal disorder     Hemodialysis T, TH, Sat  . Thyroid disease   . Renal disorder   . Diabetes mellitus without complication   . Hypertension   . Thyroid disorder   . GERD (gastroesophageal reflux disease)   . Neuropathy   . Hypothyroidism   . Peripheral vascular disease   . Anemia   . Hemodialysis patient   . Hyperlipidemia   . SVT (supraventricular tachycardia)   . Sickle cell trait   . Gastroparesis due to DM    Past Surgical History  Procedure Laterality Date  . Abdominal hysterectomy    . Knee surgery      right  . Thyroid surgery    . Thyroidectomy    . Knee arthroplasty    . Cataract extraction, bilateral    . Colonoscopy  07/19/2012    Procedure: COLONOSCOPY;  Surgeon: Beryle Beams, MD;  Location: [place];  Service: Endoscopy;  Laterality: N/A;  . Arteriovenous graft placement Left 2012    Dr. Harrington Challenger  2nd graft  . Shuntogram Left 02/14/2013    Procedure: FISTULOGRAM;   Surgeon: Serafina Mitchell, MD;  Location: [facility] CATH LAB;  Service: Cardiovascular;  Laterality: Left;   Family History  Problem Relation Age of Onset  . Diabetes Mother   . Hypertension Mother   . Heart attack Mother   . Diabetes Father   . Hypertension Father    History  Substance Use Topics  . Smoking status: Former Smoker    Types: Cigarettes    Quit date: 09/20/1993  . Smokeless tobacco: Never Used  . Alcohol Use: No   OB History    No data available     Review of Systems  Constitutional: Negative for fever, activity change and appetite change.  HENT: Negative for rhinorrhea.   Respiratory: Negative for cough, chest tightness and shortness of breath.   Cardiovascular: Negative for chest pain.  Gastrointestinal: Negative for nausea, vomiting and abdominal pain.  Genitourinary: Negative for dysuria and hematuria.  Musculoskeletal: Negative for myalgias and arthralgias.  Skin: Positive for wound. Negative for rash.  Neurological: Negative for dizziness, weakness, light-headedness and headaches.  A complete 10 system review of systems was obtained and all systems are negative except as noted in the HPI and PMH.      Allergies  Review of patient's allergies indicates no known allergies.  Home Medications   Prior to Admission medications   Medication Sig Start Date End Date Taking? Authorizing Provider  aspirin 81 MG chewable tablet Chew  81 mg by mouth daily.   Yes Historical Provider, MD  cloNIDine (CATAPRES) 0.2 MG tablet Take 0.2 mg by mouth 2 (two) times daily. Takes twice daily.  On HD days (MWF), take first dose after HD.   Yes Historical Provider, MD  diphenhydrAMINE (SOMINEX) 25 MG tablet Take 25 mg by mouth daily as needed for itching.   Yes Historical Provider, MD  esomeprazole (NEXIUM) 40 MG capsule Take 40 mg by mouth daily before breakfast.   Yes Historical Provider, MD  insulin lispro protamine-lispro (HUMALOG 75/25) (75-25) 100 UNIT/ML SUSP injection Inject  10-30 Units into the skin 3 (three) times daily. 30 units in the morning, sliding scale in the evening   Yes Historical Provider, MD  labetalol (NORMODYNE) 300 MG tablet Take 300 mg by mouth 2 (two) times daily.   Yes Historical Provider, MD  levothyroxine (SYNTHROID, LEVOTHROID) 125 MCG tablet Take 125 mcg by mouth daily before breakfast.    Yes Historical Provider, MD  NIFEdipine (PROCARDIA XL/ADALAT-CC) 90 MG 24 hr tablet Take 90 mg by mouth daily.   Yes Historical Provider, MD  oxyCODONE-acetaminophen (PERCOCET) 10-325 MG per tablet Take 1 tablet by mouth every 4 (four) hours as needed for pain.   Yes Historical Provider, MD  pravastatin (PRAVACHOL) 40 MG tablet Take 40 mg by mouth at bedtime.    Yes Historical Provider, MD  pregabalin (LYRICA) 150 MG capsule Take 1 capsule (150 mg total) by mouth 2 (two) times daily. 10/09/13  Yes Richard Blenda Mounts, DPM  promethazine (PHENERGAN) 25 MG tablet Take 25 mg by mouth every 8 (eight) hours as needed for nausea.   Yes Historical Provider, MD  sevelamer carbonate (RENVELA) 800 MG tablet Take 2,400 mg by mouth 3 (three) times daily with meals.   Yes Historical Provider, MD  sevelamer carbonate (RENVELA) 800 MG tablet Take 1,600 mg by mouth as needed (take with snack).   Yes Historical Provider, MD  Efinaconazole (JUBLIA) 10 % SOLN Apply one drop daily to affected nails as instructed Patient not taking: Reported on 09/25/2014 10/09/13   Harriet Masson, DPM   BP 127/64 mmHg  Pulse 79  Temp(Src) 97.6 F (36.4 C) (Oral)  Resp 16  SpO2 100% Physical Exam  Constitutional: She is oriented to person, place, and time. She appears well-developed and well-nourished. No distress.  HENT:  Head: Normocephalic and atraumatic.  Mouth/Throat: Oropharynx is clear and moist. No oropharyngeal exudate.  Eyes: Conjunctivae and EOM are normal. Pupils are equal, round, and reactive to light.  Neck: Normal range of motion. Neck supple.  No meningismus.  Cardiovascular: Normal  rate, regular rhythm, normal heart sounds and intact distal pulses.   No murmur heard. Pulmonary/Chest: Effort normal and breath sounds normal. No respiratory distress.  Abdominal: Soft. There is no tenderness. There is no rebound and no guarding.  Musculoskeletal: Normal range of motion. She exhibits no edema or tenderness.  LUE graft with active arterial bleeding when pressure removed.  Intact radial pulse.  Neurological: She is alert and oriented to person, place, and time. No cranial nerve deficit. She exhibits normal muscle tone. Coordination normal.  No ataxia on finger to nose bilaterally. No pronator drift. 5/5 strength throughout. CN 2-12 intact. Negative Romberg. Equal grip strength. Sensation intact. Gait is normal.   Skin: Skin is warm.  Psychiatric: She has a normal mood and affect. Her behavior is normal.  Nursing note and vitals reviewed.   ED Course  Procedures (including critical care time) Labs Review Labs Reviewed  CBC WITH DIFFERENTIAL - Abnormal; Notable for the following:    MCV 77.7 (*)    MCH 25.1 (*)    RDW 20.1 (*)    Platelets 92 (*)    All other components within normal limits  BASIC METABOLIC PANEL - Abnormal; Notable for the following:    Chloride 94 (*)    Glucose, Bld 130 (*)    Creatinine, Ser 3.82 (*)    GFR calc non Af Amer 12 (*)    GFR calc Af Amer 14 (*)    All other components within normal limits  PROTIME-INR  TROPONIN I    Imaging Review No results found.   EKG Interpretation   Date/Time:  Wednesday September 25 2014 12:54:40 EST Ventricular Rate:  66 PR Interval:  148 QRS Duration: 81 QT Interval:  443 QTC Calculation: 464 R Axis:   65 Text Interpretation:  Sinus rhythm Anterior infarct, old No significant  change was found Confirmed by Wyvonnia Dusky  MD, Atom Solivan [phone]) on 09/25/2014  1:06:27 PM      MDM   Final diagnoses:  Bleeding from dialysis shunt, initial encounter   bleeding uncontrolled with pressure on arrival.  Additional pressure applied in clamp replaced. Patient denies any chest pain, dizziness, lightheadedness, shortness of breath.  Discussed with vascular surgery who evaluated patient. No obvious pseudoaneurysm.  Bleeding subsided with additional pressure and no intervention required. Seen with dr. Donnetta Hutching.  Patient with no further bleeding from dialysis graft. Vital stable. Hemoglobin 12.7. Stable mild thrombocytopenia.  Monitored in the ED without recurrence of bleeding.  Vitals stable.  No symptoms.  Thrill intact. Appears stable for discharge with bleeding precautions given.  Ezequiel Essex, MD 09/25/14 1754

## 2014-10-22 ENCOUNTER — Telehealth: Payer: Self-pay | Admitting: *Deleted

## 2014-10-24 NOTE — Telephone Encounter (Signed)
Refill request sent for Lyrica 150 mg, take 1 po bid prn pain, qty: 60,  Dr. Noralyn Pick the refill plus 5 additional refills.

## 2014-12-15 ENCOUNTER — Emergency Department (HOSPITAL_COMMUNITY): Payer: Medicare Other

## 2014-12-15 ENCOUNTER — Encounter (HOSPITAL_COMMUNITY): Payer: Self-pay | Admitting: Nurse Practitioner

## 2014-12-15 ENCOUNTER — Emergency Department (HOSPITAL_COMMUNITY)
Admission: EM | Admit: 2014-12-15 | Discharge: 2014-12-15 | Disposition: A | Discharge status: Home / Self Care | Payer: Medicare Other | Attending: Emergency Medicine | Admitting: Emergency Medicine

## 2014-12-15 DIAGNOSIS — Z794 Long term (current) use of insulin: Secondary | ICD-10-CM | POA: Insufficient documentation

## 2014-12-15 DIAGNOSIS — E785 Hyperlipidemia, unspecified: Secondary | ICD-10-CM | POA: Diagnosis not present

## 2014-12-15 DIAGNOSIS — K219 Gastro-esophageal reflux disease without esophagitis: Secondary | ICD-10-CM | POA: Insufficient documentation

## 2014-12-15 DIAGNOSIS — Z79899 Other long term (current) drug therapy: Secondary | ICD-10-CM | POA: Diagnosis not present

## 2014-12-15 DIAGNOSIS — R059 Cough, unspecified: Secondary | ICD-10-CM

## 2014-12-15 DIAGNOSIS — E119 Type 2 diabetes mellitus without complications: Secondary | ICD-10-CM | POA: Diagnosis not present

## 2014-12-15 DIAGNOSIS — I120 Hypertensive chronic kidney disease with stage 5 chronic kidney disease or end stage renal disease: Secondary | ICD-10-CM | POA: Insufficient documentation

## 2014-12-15 DIAGNOSIS — I471 Supraventricular tachycardia: Secondary | ICD-10-CM | POA: Insufficient documentation

## 2014-12-15 DIAGNOSIS — E039 Hypothyroidism, unspecified: Secondary | ICD-10-CM | POA: Insufficient documentation

## 2014-12-15 DIAGNOSIS — Z862 Personal history of diseases of the blood and blood-forming organs and certain disorders involving the immune mechanism: Secondary | ICD-10-CM | POA: Insufficient documentation

## 2014-12-15 DIAGNOSIS — Z992 Dependence on renal dialysis: Secondary | ICD-10-CM | POA: Insufficient documentation

## 2014-12-15 DIAGNOSIS — Z87891 Personal history of nicotine dependence: Secondary | ICD-10-CM | POA: Insufficient documentation

## 2014-12-15 DIAGNOSIS — N186 End stage renal disease: Secondary | ICD-10-CM | POA: Insufficient documentation

## 2014-12-15 DIAGNOSIS — R05 Cough: Secondary | ICD-10-CM

## 2014-12-15 DIAGNOSIS — G629 Polyneuropathy, unspecified: Secondary | ICD-10-CM | POA: Diagnosis not present

## 2014-12-15 DIAGNOSIS — B349 Viral infection, unspecified: Secondary | ICD-10-CM

## 2014-12-15 DIAGNOSIS — Z7982 Long term (current) use of aspirin: Secondary | ICD-10-CM | POA: Insufficient documentation

## 2014-12-15 LAB — CBC
HCT: 37.1 % (ref 36.0–46.0)
HEMOGLOBIN: 11.9 g/dL — AB (ref 12.0–15.0)
MCH: 24.6 pg — ABNORMAL LOW (ref 26.0–34.0)
MCHC: 32.1 g/dL (ref 30.0–36.0)
MCV: 76.8 fL — ABNORMAL LOW (ref 78.0–100.0)
Platelets: 113 10*3/uL — ABNORMAL LOW (ref 150–400)
RBC: 4.83 MIL/uL (ref 3.87–5.11)
RDW: 17.3 % — ABNORMAL HIGH (ref 11.5–15.5)
WBC: 7.8 10*3/uL (ref 4.0–10.5)

## 2014-12-15 LAB — BASIC METABOLIC PANEL
Anion gap: 13 (ref 5–15)
BUN: 56 mg/dL — AB (ref 6–23)
CO2: 26 mmol/L (ref 19–32)
CREATININE: 9.79 mg/dL — AB (ref 0.50–1.10)
Calcium: 9.1 mg/dL (ref 8.4–10.5)
Chloride: 98 mmol/L (ref 96–112)
GFR, EST AFRICAN AMERICAN: 5 mL/min — AB (ref >90)
GFR, EST NON AFRICAN AMERICAN: 4 mL/min — AB (ref >90)
GLUCOSE: 71 mg/dL (ref 70–99)
Potassium: 5.8 mmol/L — ABNORMAL HIGH (ref 3.5–5.1)
SODIUM: 137 mmol/L (ref 135–145)

## 2014-12-15 LAB — I-STAT TROPONIN, ED: Troponin i, poc: 0.01 ng/mL (ref 0.00–0.08)

## 2014-12-15 LAB — RAPID STREP SCREEN (MED CTR MEBANE ONLY): STREPTOCOCCUS, GROUP A SCREEN (DIRECT): NEGATIVE

## 2014-12-15 MED ORDER — SODIUM POLYSTYRENE SULFONATE 15 GM/60ML PO SUSP
30.0000 g | Freq: Once | ORAL | Status: AC
  Administered 2014-12-15: 30 g via ORAL
  Filled 2014-12-15: qty 120

## 2014-12-15 NOTE — ED Notes (Signed)
Phlebotomy at bedside.

## 2014-12-15 NOTE — Discharge Instructions (Signed)
Cough, Adult  A cough is a reflex that helps clear your throat and airways. It can help heal the body or may be a reaction to an irritated airway. A cough may only last 2 or 3 weeks (acute) or may last more than 8 weeks (chronic).  CAUSES Acute cough:  Viral or bacterial infections. Chronic cough:  Infections.  Allergies.  Asthma.  Post-nasal drip.  Smoking.  Heartburn or acid reflux.  Some medicines.  Chronic lung problems (COPD).  Cancer. SYMPTOMS   Cough.  Fever.  Chest pain.  Increased breathing rate.  High-pitched whistling sound when breathing (wheezing).  Colored mucus that you cough up (sputum). TREATMENT   A bacterial cough may be treated with antibiotic medicine.  A viral cough must run its course and will not respond to antibiotics.  Your caregiver may recommend other treatments if you have a chronic cough. HOME CARE INSTRUCTIONS   Only take over-the-counter or prescription medicines for pain, discomfort, or fever as directed by your caregiver. Use cough suppressants only as directed by your caregiver.  Use a cold steam vaporizer or humidifier in your bedroom or home to help loosen secretions.  Sleep in a semi-upright position if your cough is worse at night.  Rest as needed.  Stop smoking if you smoke. SEEK IMMEDIATE MEDICAL CARE IF:   You have pus in your sputum.  Your cough starts to worsen.  You cannot control your cough with suppressants and are losing sleep.  You begin coughing up blood.  You have difficulty breathing.  You develop pain which is getting worse or is uncontrolled with medicine.  You have a fever. MAKE SURE YOU:   Understand these instructions.  Will watch your condition.  Will get help right away if you are not doing well or get worse. Document Released: 03/05/2011 Document Revised: 11/29/2011 Document Reviewed: 03/05/2011 Exit[employer] Patient Information 2015 [employer]. This information is not intended  to replace advice given to you by your health care provider. Make sure you discuss any questions you have with your health care provider.  

## 2014-12-15 NOTE — ED Notes (Signed)
MD Nanavati at bedside.  

## 2014-12-15 NOTE — ED Provider Notes (Signed)
CSN: [id number]     Arrival date & time 12/15/14  1813 History   First MD Initiated Contact with Patient 12/15/14 1913     Chief Complaint  Patient presents with  . Chest Pain  . Cough     (Consider location/radiation/quality/duration/timing/severity/associated sxs/prior Treatment) HPI   This is a 54 year old female with multiple comorbidities ESRD on dialysis Monday Wednesday Friday last dialysis yesterday.  Scheduled for dialysis at 5:30 tomorrow AM  She comes in today with complaint of 2 days of cough and body aches, generalized malaise, sore throat, rhinorrhea.  No known sick contacts, no fever.  No vomiting/diarrhea.  Past Medical History  Diagnosis Date  . Diabetes mellitus   . Supraventricular tachycardia   . Renal disorder     Hemodialysis T, TH, Sat  . Thyroid disease   . Renal disorder   . Diabetes mellitus without complication   . Hypertension   . Thyroid disorder   . GERD (gastroesophageal reflux disease)   . Neuropathy   . Hypothyroidism   . Peripheral vascular disease   . Anemia   . Hemodialysis patient   . Hyperlipidemia   . SVT (supraventricular tachycardia)   . Sickle cell trait   . Gastroparesis due to DM    Past Surgical History  Procedure Laterality Date  . Abdominal hysterectomy    . Knee surgery      right  . Thyroid surgery    . Thyroidectomy    . Knee arthroplasty    . Cataract extraction, bilateral    . Colonoscopy  07/19/2012    Procedure: COLONOSCOPY;  Surgeon: Beryle Beams, MD;  Location: [place];  Service: Endoscopy;  Laterality: N/A;  . Arteriovenous graft placement Left 2012    Dr. Harrington Challenger  2nd graft  . Shuntogram Left 02/14/2013    Procedure: FISTULOGRAM;  Surgeon: Serafina Mitchell, MD;  Location[facility] CATH LAB;  Service: Cardiovascular;  Laterality: Left;   Family History  Problem Relation Age of Onset  . Diabetes Mother   . Hypertension Mother   . Heart attack Mother   . Diabetes Father   . Hypertension Father    History   Substance Use Topics  . Smoking status: Former Smoker    Types: Cigarettes    Quit date: 09/20/1993  . Smokeless tobacco: Never Used  . Alcohol Use: No   OB History    No data available     Review of Systems  Constitutional: Negative for fever and chills.  HENT: Positive for rhinorrhea and sore throat. Negative for nosebleeds.   Eyes: Negative for visual disturbance.  Respiratory: Positive for cough. Negative for shortness of breath.   Cardiovascular: Negative for chest pain.  Gastrointestinal: Negative for nausea, vomiting, abdominal pain, diarrhea and constipation.  Genitourinary: Negative for dysuria.  Musculoskeletal: Positive for myalgias.  Skin: Negative for rash.  Neurological: Negative for weakness.  All other systems reviewed and are negative.     Allergies  Review of patient's allergies indicates no known allergies.  Home Medications   Prior to Admission medications   Medication Sig Start Date End Date Taking? Authorizing Provider  aspirin 81 MG chewable tablet Chew 81 mg by mouth daily.    Historical Provider, MD  cloNIDine (CATAPRES) 0.2 MG tablet Take 0.2 mg by mouth 2 (two) times daily. Takes twice daily.  On HD days (MWF), take first dose after HD.    Historical Provider, MD  diphenhydrAMINE (SOMINEX) 25 MG tablet Take 25 mg by mouth daily  as needed for itching.    Historical Provider, MD  Efinaconazole (JUBLIA) 10 % SOLN Apply one drop daily to affected nails as instructed Patient not taking: Reported on 09/25/2014 10/09/13   Harriet Masson, DPM  esomeprazole (NEXIUM) 40 MG capsule Take 40 mg by mouth daily before breakfast.    Historical Provider, MD  insulin lispro protamine-lispro (HUMALOG 75/25) (75-25) 100 UNIT/ML SUSP injection Inject 10-30 Units into the skin 3 (three) times daily. 30 units in the morning, sliding scale in the evening    Historical Provider, MD  labetalol (NORMODYNE) 300 MG tablet Take 300 mg by mouth 2 (two) times daily.    Historical  Provider, MD  levothyroxine (SYNTHROID, LEVOTHROID) 125 MCG tablet Take 125 mcg by mouth daily before breakfast.     Historical Provider, MD  NIFEdipine (PROCARDIA XL/ADALAT-CC) 90 MG 24 hr tablet Take 90 mg by mouth daily.    Historical Provider, MD  NOVOFINE 32G X 6 MM MISC  11/27/14   Historical Provider, MD  oxyCODONE-acetaminophen (PERCOCET) 10-325 MG per tablet Take 1 tablet by mouth every 4 (four) hours as needed for pain.    Historical Provider, MD  pravastatin (PRAVACHOL) 40 MG tablet Take 40 mg by mouth at bedtime.     Historical Provider, MD  pregabalin (LYRICA) 150 MG capsule Take 1 capsule (150 mg total) by mouth 2 (two) times daily. 10/09/13   Harriet Masson, DPM  promethazine (PHENERGAN) 25 MG tablet Take 25 mg by mouth every 8 (eight) hours as needed for nausea.    Historical Provider, MD  sevelamer carbonate (RENVELA) 800 MG tablet Take 2,400 mg by mouth 3 (three) times daily with meals.    Historical Provider, MD  sevelamer carbonate (RENVELA) 800 MG tablet Take 1,600 mg by mouth as needed (take with snack).    Historical Provider, MD   BP 173/66 mmHg  Pulse 77  Temp(Src) 98.4 F (36.9 C) (Oral)  Resp 20  SpO2 94% Physical Exam  Constitutional: She is oriented to person, place, and time. No distress.  HENT:  Head: Normocephalic and atraumatic.  Mouth/Throat: Oropharynx is clear and moist. No oropharyngeal exudate.  Eyes: EOM are normal. Pupils are equal, round, and reactive to light.  Neck: Normal range of motion. Neck supple.  Cardiovascular: Normal rate and intact distal pulses.   Pulmonary/Chest: No respiratory distress.  Abdominal: Soft. There is no tenderness.  Musculoskeletal: Normal range of motion.  Neurological: She is alert and oriented to person, place, and time.  Skin: No rash noted. She is not diaphoretic.  Psychiatric: She has a normal mood and affect.    ED Course  Procedures (including critical care time) Labs Review Labs Reviewed  CBC - Abnormal;  Notable for the following:    Hemoglobin 11.9 (*)    MCV 76.8 (*)    MCH 24.6 (*)    RDW 17.3 (*)    Platelets 113 (*)    All other components within normal limits  BASIC METABOLIC PANEL - Abnormal; Notable for the following:    Potassium 5.8 (*)    BUN 56 (*)    Creatinine, Ser 9.79 (*)    GFR calc non Af Amer 4 (*)    GFR calc Af Amer 5 (*)    All other components within normal limits  RAPID STREP SCREEN  I-STAT TROPOININ, ED    Imaging Review Dg Chest 2 View  12/15/2014   CLINICAL DATA:  Productive cough, body aches and sore throat since Friday.  EXAM:  CHEST  2 VIEW  COMPARISON:  02/01/2014  FINDINGS: The cardiac silhouette, mediastinal and hilar contours are within normal limits and stable. Minimal linear scarring changes in both lungs but no infiltrates, edema or effusions. The bony thorax is intact. Stable left axillary and subclavian stent.  IMPRESSION: No acute cardiopulmonary findings.   Electronically Signed   By: Marijo Sanes M.D.   On: 12/15/2014 19:16     EKG Interpretation   Date/Time:  Sunday December 15 2014 18:20:33 EDT Ventricular Rate:  76 PR Interval:  152 QRS Duration: 78 QT Interval:  400 QTC Calculation: 450 R Axis:   43 Text Interpretation:  Normal sinus rhythm Low voltage QRS Cannot rule out  Anterior infarct , age undetermined Abnormal ECG No significant change  since last tracing Confirmed by Kathrynn Humble, MD, Thelma Comp [phone]) on 12/15/2014  8:10:19 PM      MDM   Final diagnoses:  None     This is a 54 year old female with multiple comorbidities ESRD on dialysis Monday Wednesday Friday last dialysis yesterday.  Scheduled for dialysis at 5:30 tomorrow AM  She comes in today with complaint of 2 days of cough and body aches, generalized malaise, sore throat, rhinorrhea.  No known sick contacts, no fever.  No vomiting/diarrhea.  Exam as above, AFVSS.  Well appearing.  Lungs CTAB.  CXR obtained and no infiltrate.   Pharynx WNL.  Rapid strep  negative.  ekg obtained and shows no peaked T waves. Cbc/bmp obtained and remarkable for hyperkalemia w/ K of 5.8.  Will give kayexalate.  Patient to have dialysis in less than 12 hours.  Feel safe for d/c at this time.  Likely viral URI  I have discussed the results, Dx and Tx plan with the patient. They expressed understanding and agree with the plan and were told to return to ED with any worsening of condition or concern.    Disposition: Discharge  Condition: Good  Discharge Medication List as of 12/15/2014  9:04 PM      Follow Up: Ricke Hey, MD [place] [id number] [phone]      Pt seen in conjunction with Dr. Anson Fret, MD 12/16/14 [id number]  Varney Biles, MD 12/16/14 [id number]

## 2014-12-15 NOTE — ED Notes (Signed)
MD Proulx at bedside.  

## 2014-12-15 NOTE — ED Notes (Signed)
She reports onset productive cough and body aches on Friday. seh took her allergy medicine with no relief. Then yesterday she began to have pain from abdomen to chest when she swallowed so she took her GERD medicine with no relief. Reports continued cp, cough, body aches.

## 2014-12-18 LAB — CULTURE, GROUP A STREP: Strep A Culture: NEGATIVE

## 2014-12-20 ENCOUNTER — Emergency Department (HOSPITAL_COMMUNITY)
Admission: EM | Admit: 2014-12-20 | Discharge: 2014-12-20 | Disposition: A | Discharge status: Home / Self Care | Payer: Medicare Other | Attending: Emergency Medicine | Admitting: Emergency Medicine

## 2014-12-20 ENCOUNTER — Encounter (HOSPITAL_COMMUNITY): Payer: Self-pay | Admitting: *Deleted

## 2014-12-20 ENCOUNTER — Emergency Department (HOSPITAL_COMMUNITY): Payer: Medicare Other

## 2014-12-20 DIAGNOSIS — Z87448 Personal history of other diseases of urinary system: Secondary | ICD-10-CM | POA: Insufficient documentation

## 2014-12-20 DIAGNOSIS — Z794 Long term (current) use of insulin: Secondary | ICD-10-CM | POA: Insufficient documentation

## 2014-12-20 DIAGNOSIS — R079 Chest pain, unspecified: Secondary | ICD-10-CM | POA: Diagnosis present

## 2014-12-20 DIAGNOSIS — I499 Cardiac arrhythmia, unspecified: Secondary | ICD-10-CM | POA: Insufficient documentation

## 2014-12-20 DIAGNOSIS — I10 Essential (primary) hypertension: Secondary | ICD-10-CM | POA: Diagnosis not present

## 2014-12-20 DIAGNOSIS — Z992 Dependence on renal dialysis: Secondary | ICD-10-CM | POA: Diagnosis not present

## 2014-12-20 DIAGNOSIS — Z79899 Other long term (current) drug therapy: Secondary | ICD-10-CM | POA: Insufficient documentation

## 2014-12-20 DIAGNOSIS — E039 Hypothyroidism, unspecified: Secondary | ICD-10-CM | POA: Insufficient documentation

## 2014-12-20 DIAGNOSIS — Z8669 Personal history of other diseases of the nervous system and sense organs: Secondary | ICD-10-CM | POA: Insufficient documentation

## 2014-12-20 DIAGNOSIS — I471 Supraventricular tachycardia, unspecified: Secondary | ICD-10-CM

## 2014-12-20 DIAGNOSIS — Z862 Personal history of diseases of the blood and blood-forming organs and certain disorders involving the immune mechanism: Secondary | ICD-10-CM | POA: Insufficient documentation

## 2014-12-20 DIAGNOSIS — K219 Gastro-esophageal reflux disease without esophagitis: Secondary | ICD-10-CM | POA: Diagnosis not present

## 2014-12-20 DIAGNOSIS — E119 Type 2 diabetes mellitus without complications: Secondary | ICD-10-CM | POA: Insufficient documentation

## 2014-12-20 DIAGNOSIS — Z87891 Personal history of nicotine dependence: Secondary | ICD-10-CM | POA: Insufficient documentation

## 2014-12-20 DIAGNOSIS — E785 Hyperlipidemia, unspecified: Secondary | ICD-10-CM | POA: Diagnosis not present

## 2014-12-20 DIAGNOSIS — R0602 Shortness of breath: Secondary | ICD-10-CM

## 2014-12-20 LAB — I-STAT TROPONIN, ED: Troponin i, poc: 0.01 ng/mL (ref 0.00–0.08)

## 2014-12-20 MED ORDER — ADENOSINE 6 MG/2ML IV SOLN
6.0000 mg | Freq: Once | INTRAVENOUS | Status: AC
  Administered 2014-12-20: 6 mg via INTRAVENOUS
  Filled 2014-12-20: qty 2

## 2014-12-20 NOTE — ED Provider Notes (Signed)
CSN: [id number]     Arrival date & time 12/20/14  [id number] History  This chart was scribed for Karina Rice, MD by Rayfield Citizen, ED Scribe. This patient was seen in room D34C/D34C and the patient's care was started at 3:55 AM.    Chief Complaint  Patient presents with  . Irregular Heart Beat  . Chest Pain   Patient is a 54 y.o. female presenting with chest pain. The history is provided by the patient. No language interpreter was used.  Chest Pain Associated symptoms: palpitations and shortness of breath   Associated symptoms: no abdominal pain, no back pain, no dizziness, no fever, no headache, no nausea, no numbness, not vomiting and no weakness      HPI Comments: [name] is a 54 y.o. female with past medical history of DM, SVT, renal disorder, thyroid disease, HTN, GERD, neuropathy, peripheral vascular disease, anemia, HLD, sickle cell trait, gastroparesis due to DM who presents to the Emergency Department complaining of irregular heart beat. Patient reports she woke from sleep with her "heart racing." She attempted to correct her heart rate by "bearing down" but her symptoms continued for the next hour, prompting her to come to the ED tonight. Patient reports that she becomes SOB when her SVT continues for any significant time. She currently complains of racing heart, SOB, and some substernal chest pain which she associates with her heart rate.   Last episode of SVT "a while ago."  Past Medical History  Diagnosis Date  . Diabetes mellitus   . Supraventricular tachycardia   . Renal disorder     Hemodialysis T, TH, Sat  . Thyroid disease   . Renal disorder   . Diabetes mellitus without complication   . Hypertension   . Thyroid disorder   . GERD (gastroesophageal reflux disease)   . Neuropathy   . Hypothyroidism   . Peripheral vascular disease   . Anemia   . Hemodialysis patient   . Hyperlipidemia   . SVT (supraventricular tachycardia)   . Sickle cell trait   .  Gastroparesis due to DM    Past Surgical History  Procedure Laterality Date  . Abdominal hysterectomy    . Knee surgery      right  . Thyroid surgery    . Thyroidectomy    . Knee arthroplasty    . Cataract extraction, bilateral    . Colonoscopy  07/19/2012    Procedure: COLONOSCOPY;  Surgeon: Beryle Beams, MD;  Location: [place];  Service: Endoscopy;  Laterality: N/A;  . Arteriovenous graft placement Left 2012    Dr. Harrington Challenger  2nd graft  . Shuntogram Left 02/14/2013    Procedure: FISTULOGRAM;  Surgeon: Serafina Mitchell, MD;  Location: [facility] CATH LAB;  Service: Cardiovascular;  Laterality: Left;   Family History  Problem Relation Age of Onset  . Diabetes Mother   . Hypertension Mother   . Heart attack Mother   . Diabetes Father   . Hypertension Father    History  Substance Use Topics  . Smoking status: Former Smoker    Types: Cigarettes    Quit date: 09/20/1993  . Smokeless tobacco: Never Used  . Alcohol Use: No   OB History    Gravida Para Term Preterm AB TAB SAB Ectopic Multiple Living            3     Review of Systems  Constitutional: Negative for fever and chills.  Respiratory: Positive for shortness of breath.  Cardiovascular: Positive for chest pain and palpitations. Negative for leg swelling.  Gastrointestinal: Negative for nausea, vomiting, abdominal pain and diarrhea.  Musculoskeletal: Negative for back pain, neck pain and neck stiffness.  Skin: Negative for rash and wound.  Neurological: Negative for dizziness, weakness, light-headedness, numbness and headaches.  All other systems reviewed and are negative.     Allergies  Review of patient's allergies indicates no known allergies.  Home Medications   Prior to Admission medications   Medication Sig Start Date End Date Taking? Authorizing Provider  aspirin 81 MG chewable tablet Chew 81 mg by mouth daily.    Historical Provider, MD  cloNIDine (CATAPRES) 0.2 MG tablet Take 0.2 mg by mouth 2 (two) times  daily. Takes twice daily.  On HD days (MWF), take first dose after HD.    Historical Provider, MD  diphenhydrAMINE (SOMINEX) 25 MG tablet Take 25 mg by mouth daily as needed for itching.    Historical Provider, MD  Efinaconazole (JUBLIA) 10 % SOLN Apply one drop daily to affected nails as instructed Patient not taking: Reported on 09/25/2014 10/09/13   Harriet Masson, DPM  esomeprazole (NEXIUM) 40 MG capsule Take 40 mg by mouth daily before breakfast.    Historical Provider, MD  insulin lispro protamine-lispro (HUMALOG 75/25) (75-25) 100 UNIT/ML SUSP injection Inject 10-30 Units into the skin 3 (three) times daily. 30 units in the morning, sliding scale in the evening    Historical Provider, MD  labetalol (NORMODYNE) 300 MG tablet Take 300 mg by mouth 2 (two) times daily.    Historical Provider, MD  levothyroxine (SYNTHROID, LEVOTHROID) 125 MCG tablet Take 125 mcg by mouth daily before breakfast.     Historical Provider, MD  NIFEdipine (PROCARDIA XL/ADALAT-CC) 90 MG 24 hr tablet Take 90 mg by mouth daily.    Historical Provider, MD  NOVOFINE 32G X 6 MM MISC  11/27/14   Historical Provider, MD  oxyCODONE-acetaminophen (PERCOCET) 10-325 MG per tablet Take 1 tablet by mouth every 4 (four) hours as needed for pain.    Historical Provider, MD  pravastatin (PRAVACHOL) 40 MG tablet Take 40 mg by mouth at bedtime.     Historical Provider, MD  pregabalin (LYRICA) 150 MG capsule Take 1 capsule (150 mg total) by mouth 2 (two) times daily. 10/09/13   Harriet Masson, DPM  promethazine (PHENERGAN) 25 MG tablet Take 25 mg by mouth every 8 (eight) hours as needed for nausea.    Historical Provider, MD  sevelamer carbonate (RENVELA) 800 MG tablet Take 2,400 mg by mouth 3 (three) times daily with meals.    Historical Provider, MD  sevelamer carbonate (RENVELA) 800 MG tablet Take 1,600 mg by mouth as needed (take with snack).    Historical Provider, MD   BP 150/59 mmHg  Pulse 81  Temp(Src) 98.3 F (36.8 C) (Oral)  Resp  18  Ht 5\' 2"  (1.575 m)  Wt 205 lb (92.987 kg)  BMI 37.49 kg/m2  SpO2 95% Physical Exam  Constitutional: She is oriented to person, place, and time. She appears well-developed and well-nourished. No distress.  HENT:  Head: Normocephalic and atraumatic.  Mouth/Throat: Oropharynx is clear and moist.  Eyes: EOM are normal. Pupils are equal, round, and reactive to light.  Neck: Normal range of motion. Neck supple.  Cardiovascular: Regular rhythm.   Tachycardia  Pulmonary/Chest: Effort normal and breath sounds normal. No respiratory distress. She has no wheezes. She has no rales. She exhibits tenderness (chest tenderness reproduced with palpation over the inferior sternum.).  Abdominal: Soft. Bowel sounds are normal. She exhibits no distension and no mass. There is no tenderness. There is no rebound and no guarding.  Musculoskeletal: Normal range of motion. She exhibits no edema or tenderness.  No lower extremity swelling or tenderness.  Neurological: She is alert and oriented to person, place, and time.  5/5 motor in all extremities. Sensation is fully intact.  Skin: Skin is warm and dry. No rash noted. No erythema.  Psychiatric: She has a normal mood and affect. Her behavior is normal.  Nursing note and vitals reviewed.   ED Course  CARDIOVERSION Date/Time: 12/20/2014 4:03 AM Performed by: [name] Authorized by: Lita Mains, Nychelle Cassata Consent: The procedure was performed in an emergent situation. Verbal consent obtained. Patient sedated: no Pre-procedure rhythm: supraventricular tachycardia Post-procedure rhythm: normal sinus rhythm Patient tolerance: Patient tolerated the procedure well with no immediate complications Comments: 6 mg of adenosine given. Patient's converted to normal sinus rhythm. Blood pressure stable. Patient tolerated well.     DIAGNOSTIC STUDIES: Oxygen Saturation is 98% on RA, normal by my interpretation.    COORDINATION OF CARE: 3:57 AM Discussed  treatment plan with pt at bedside and pt agreed to plan.   Labs Review Labs Reviewed  I-STAT CHEM 8, ED  Randolm Idol, ED    Imaging Review Dg Chest Port 1 View  12/20/2014   CLINICAL DATA:  Acute onset of shortness of breath. Initial encounter.  EXAM: PORTABLE CHEST - 1 VIEW  COMPARISON:  Chest radiograph performed 12/15/2014  FINDINGS: The lungs are mildly hypoexpanded. Mild vascular crowding is noted. No definite pleural effusion or pneumothorax is seen.  The cardiomediastinal silhouette is mildly enlarged. External pacing pads are noted. No acute osseous abnormalities are seen.  IMPRESSION: Lungs mildly hypoexpanded and grossly clear. Mild cardiomegaly noted.   Electronically Signed   By: Garald Balding M.D.   On: 12/20/2014 04:49     EKG Interpretation None      MDM   Final diagnoses:  SOB (shortness of breath)  SVT (supraventricular tachycardia)    I personally performed the services described in this documentation, which was scribed in my presence. The recorded information has been reviewed and is accurate.  Modified Valsalva maneuver attempted the emergency department without conversion to normal sinus rhythm. Chemical cardioversion with adenosine successful.  Patient advised to follow-up with her cardiologist. Return precautions given.  Karina Rice, MD 12/20/14 [phone]

## 2014-12-20 NOTE — ED Notes (Signed)
Pt is scheduled for dialysis today @0600 

## 2014-12-20 NOTE — ED Notes (Signed)
Pt arrives from home via [facility]. Pt c/o heart fluttering and chest discomfort. Pt also c/o SOB and cough.

## 2014-12-20 NOTE — Discharge Instructions (Signed)
Supraventricular Tachycardia °Supraventricular tachycardia (SVT) is an abnormal heart rhythm (arrhythmia) that causes the heart to beat very fast (tachycardia). This kind of fast heartbeat originates in the upper chambers of the heart (atria). SVT can cause the heart to beat greater than 100 beats per minute. SVT can have a rapid burst of heartbeats. This can start and stop suddenly without warning and is called nonsustained. SVT can also be sustained, in which the heart beats at a continuous fast rate.  °CAUSES  °There can be different causes of SVT. Some of these include: °· Heart valve problems such as mitral valve prolapse. °· An enlarged heart (hypertrophic cardiomyopathy). °· Congenital heart problems. °· Heart inflammation (pericarditis). °· Hyperthyroidism. °· Low potassium or magnesium levels. °· Caffeine. °· Drug use such as cocaine, methamphetamines, or stimulants. °· Some over-the-counter medicines such as: °¨ Decongestants. °¨ Diet medicines. °¨ Herbal medicines. °SYMPTOMS  °Symptoms of SVT can vary. Symptoms depend on whether the SVT is sustained or nonsustained. You may experience: °· No symptoms (asymptomatic). °· An awareness of your heart beating rapidly (palpitations). °· Shortness of breath. °· Chest pain or pressure. °If your blood pressure drops because of the SVT, you may experience: °· Fainting or near fainting. °· Weakness. °· Dizziness. °DIAGNOSIS  °Different tests can be performed to diagnose SVT, such as: °· An electrocardiogram (EKG). This is a painless test that records the electrical activity of your heart. °· Holter monitor. This is a 24 hour recording of your heart rhythm. You will be given a diary. Write down all symptoms that you have and what you were doing at the time you experienced symptoms. °· Arrhythmia monitor. This is a small device that your wear for several weeks. It records the heart rhythm when you have symptoms. °· Echocardiogram. This is an imaging test to help detect  abnormal heart structure such as congenital abnormalities, heart valve problems, or heart enlargement. °· Stress test. This test can help determine if the SVT is related to exercise. °· Electrophysiology study (EPS). This is a procedure that evaluates your heart's electrical system and can help your caregiver find the cause of your SVT. °TREATMENT  °Treatment of SVT depends on the symptoms, how often it recurs, and whether there are any underlying heart problems.  °· If symptoms are rare and no other cardiac disease is present, no treatment may be needed. °· Blood work may be done to check potassium, magnesium, and thyroid hormone levels to see if they are abnormal. If these levels are abnormal, treatment to correct the problems will occur. °Medicines °Your caregiver may use oral medicines to treat SVT. These medicines are given for long-term control of SVT. Medicines may be used alone or in combination with other treatments. These medicines work to slow nerve impulses in the heart muscle. These medicines can also be used to treat high blood pressure. Some of these medicines may include: °· Calcium channel blockers. °· Beta blockers. °· Digoxin. °Nonsurgical procedures °Nonsurgical techniques may be used if oral medicines do not work. Some examples include: °· Cardioversion. This technique uses either drugs or an electrical shock to restore a normal heart rhythm. °¨ Cardioversion drugs may be given through an intravenous (IV) line to help "reset" the heart rhythm. °¨ In electrical cardioversion, the caregiver shocks your heart to stop its beat for a split second. This helps to reset the heart to a normal rhythm. °· Ablation. This procedure is done under mild sedation. High frequency radio wave energy is used to   destroy the area of heart tissue responsible for the SVT. °HOME CARE INSTRUCTIONS  °· Do not smoke. °· Only take medicines prescribed by your caregiver. Check with your caregiver before using over-the-counter  medicines. °· Check with your caregiver about how much alcohol and caffeine (coffee, tea, colas, or chocolate) you may have. °· It is very important to keep all follow-up referrals and appointments in order to properly manage this problem. °SEEK IMMEDIATE MEDICAL CARE IF: °· You have dizziness. °· You faint or nearly faint. °· You have shortness of breath. °· You have chest pain or pressure. °· You have sudden nausea or vomiting. °· You have profuse sweating. °· You are concerned about how long your symptoms last. °· You are concerned about the frequency of your SVT episodes. °If you have the above symptoms, call your local emergency services (911 in [place] immediately. Do not drive yourself to the hospital. °MAKE SURE YOU:  °· Understand these instructions. °· Will watch your condition. °· Will get help right away if you are not doing well or get worse. °Document Released: 09/06/2005 Document Revised: 11/29/2011 Document Reviewed: 12/19/2008 °Exit[employer]® Patient Information ©2015 [employer]. This information is not intended to replace advice given to you by your health care provider. Make sure you discuss any questions you have with your health care provider. ° °

## 2015-01-13 ENCOUNTER — Telehealth: Payer: Self-pay | Admitting: Cardiology

## 2015-01-15 NOTE — Telephone Encounter (Signed)
Close encounter 

## 2015-01-16 ENCOUNTER — Ambulatory Visit: Payer: Medicare Other | Admitting: Cardiology

## 2015-02-04 ENCOUNTER — Ambulatory Visit: Payer: Medicare Other | Admitting: Certified Nurse Midwife

## 2015-02-14 ENCOUNTER — Ambulatory Visit: Payer: Medicare Other | Admitting: Certified Nurse Midwife

## 2015-02-21 ENCOUNTER — Ambulatory Visit (INDEPENDENT_AMBULATORY_CARE_PROVIDER_SITE_OTHER): Payer: Medicare Other | Admitting: Certified Nurse Midwife

## 2015-02-21 ENCOUNTER — Encounter: Payer: Self-pay | Admitting: Certified Nurse Midwife

## 2015-02-21 VITALS — BP 184/88 | HR 77 | Temp 98.5°F | Ht 62.0 in | Wt 215.0 lb

## 2015-02-21 DIAGNOSIS — Z9189 Other specified personal risk factors, not elsewhere classified: Secondary | ICD-10-CM

## 2015-02-21 DIAGNOSIS — Z01419 Encounter for gynecological examination (general) (routine) without abnormal findings: Secondary | ICD-10-CM

## 2015-02-21 DIAGNOSIS — Z1231 Encounter for screening mammogram for malignant neoplasm of breast: Secondary | ICD-10-CM | POA: Diagnosis not present

## 2015-02-21 NOTE — Progress Notes (Signed)
Patient ID: [name], female   DOB: August 27, 1961, 54 y.o.   MRN: [id number]    Subjective:      [name] is a 54 y.o. female here for a routine exam.  Had a partial-hysterectomy for fibroids and chronic anemia in 1999, has ovaries & cervix.  Is currently on dialysis.  Not currently sexually active.    Personal health questionnaire:  Is patient Ashkenazi Jewish, have a family history of breast and/or ovarian cancer: no Is there a family history of uterine cancer diagnosed at age < 66, gastrointestinal cancer, urinary tract cancer, family member who is a Field seismologist syndrome-associated carrier: no Is the patient overweight and hypertensive, family history of diabetes, personal history of gestational diabetes, preeclampsia or PCOS: yes Is patient over 76, have PCOS,  family history of premature CHD under age 71, diabetes, smoke, have hypertension or peripheral artery disease:  yes At any time, has a partner hit, kicked or otherwise hurt or frightened you?: no Over the past 2 weeks, have you felt down, depressed or hopeless?: no Over the past 2 weeks, have you felt little interest or pleasure in doing things?:no   Gynecologic History No LMP recorded. Patient has had a hysterectomy. Contraception: status post hysterectomy Last Pap: unknown. Results were: normal according to the patient Last mammogram: 02/27/09. Results were: normal  Obstetric History OB History  Gravida Para Term Preterm AB SAB TAB Ectopic Multiple Living           3        Past Medical History  Diagnosis Date  . Diabetes mellitus   . Supraventricular tachycardia   . Renal disorder     Hemodialysis T, TH, Sat  . Thyroid disease   . Renal disorder   . Diabetes mellitus without complication   . Hypertension   . Thyroid disorder   . GERD (gastroesophageal reflux disease)   . Neuropathy   . Hypothyroidism   . Peripheral vascular disease   . Anemia   . Hemodialysis patient   . Hyperlipidemia   . SVT  (supraventricular tachycardia)   . Sickle cell trait   . Gastroparesis due to DM     Past Surgical History  Procedure Laterality Date  . Abdominal hysterectomy    . Knee surgery      right  . Thyroid surgery    . Thyroidectomy    . Knee arthroplasty    . Cataract extraction, bilateral    . Colonoscopy  07/19/2012    Procedure: COLONOSCOPY;  Surgeon: Beryle Beams, MD;  Location: [place];  Service: Endoscopy;  Laterality: N/A;  . Arteriovenous graft placement Left 2012    Dr. Harrington Challenger  2nd graft  . Shuntogram Left 02/14/2013    Procedure: FISTULOGRAM;  Surgeon: Serafina Mitchell, MD;  Location: [facility] CATH LAB;  Service: Cardiovascular;  Laterality: Left;     Current outpatient prescriptions:  .  aspirin 81 MG chewable tablet, Chew 81 mg by mouth daily., Disp: , Rfl:  .  cinacalcet (SENSIPAR) 60 MG tablet, Take 60 mg by mouth daily., Disp: , Rfl:  .  cloNIDine (CATAPRES) 0.2 MG tablet, Take 0.2 mg by mouth 2 (two) times daily. Takes twice daily.  On HD days (MWF), take first dose after HD., Disp: , Rfl:  .  esomeprazole (NEXIUM) 40 MG capsule, Take 40 mg by mouth daily before breakfast., Disp: , Rfl:  .  insulin lispro protamine-lispro (HUMALOG 75/25) (75-25) 100 UNIT/ML SUSP injection, Inject 10-30 Units into  the skin 3 (three) times daily. 30 units in the morning, sliding scale in the evening, Disp: , Rfl:  .  labetalol (NORMODYNE) 300 MG tablet, Take 300 mg by mouth 2 (two) times daily., Disp: , Rfl:  .  levothyroxine (SYNTHROID, LEVOTHROID) 125 MCG tablet, Take 125 mcg by mouth daily before breakfast. , Disp: , Rfl:  .  NIFEdipine (PROCARDIA XL/ADALAT-CC) 90 MG 24 hr tablet, Take 90 mg by mouth daily., Disp: , Rfl:  .  oxyCODONE-acetaminophen (PERCOCET) 10-325 MG per tablet, Take 1 tablet by mouth every 4 (four) hours as needed for pain., Disp: , Rfl:  .  pravastatin (PRAVACHOL) 40 MG tablet, Take 40 mg by mouth at bedtime. , Disp: , Rfl:  .  pregabalin (LYRICA) 150 MG capsule, Take 1  capsule (150 mg total) by mouth 2 (two) times daily., Disp: 60 capsule, Rfl: 5 .  promethazine (PHENERGAN) 25 MG tablet, Take 25 mg by mouth every 8 (eight) hours as needed for nausea., Disp: , Rfl:  .  sevelamer carbonate (RENVELA) 800 MG tablet, Take 2,400 mg by mouth 3 (three) times daily with meals., Disp: , Rfl:  .  diphenhydrAMINE (SOMINEX) 25 MG tablet, Take 25 mg by mouth daily as needed for itching., Disp: , Rfl:  .  Efinaconazole (JUBLIA) 10 % SOLN, Apply one drop daily to affected nails as instructed (Patient not taking: Reported on 09/25/2014), Disp: 4 mL, Rfl: 10 .  NOVOFINE 32G X 6 MM MISC, , Disp: , Rfl: 0 .  sevelamer carbonate (RENVELA) 800 MG tablet, Take 1,600 mg by mouth as needed (take with snack)., Disp: , Rfl:  No Known Allergies  History  Substance Use Topics  . Smoking status: Former Smoker    Types: Cigarettes    Quit date: 09/20/1993  . Smokeless tobacco: Never Used  . Alcohol Use: No    Family History  Problem Relation Age of Onset  . Diabetes Mother   . Hypertension Mother   . Heart attack Mother   . Diabetes Father   . Hypertension Father       Review of Systems  Constitutional: negative for fatigue and weight loss Respiratory: negative for cough and wheezing Cardiovascular: negative for chest pain, fatigue and palpitations Gastrointestinal: negative for abdominal pain and change in bowel habits Musculoskeletal:negative for myalgias Neurological: negative for gait problems and tremors Behavioral/Psych: negative for abusive relationship, depression Endocrine: negative for temperature intolerance   Genitourinary:negative for abnormal menstrual periods, genital lesions, hot flashes, sexual problems and vaginal discharge Integument/breast: negative for breast lump, breast tenderness, nipple discharge and skin lesion(s)    Objective:       BP 184/88 mmHg  Pulse 77  Temp(Src) 98.5 F (36.9 C)  Ht 5\' 2"  (1.575 m)  Wt 215 lb (97.523 kg)  BMI 39.31  kg/m2 General:   alert  Skin:   no rash or abnormalities  Lungs:   clear to auscultation bilaterally  Heart:   regular rate and rhythm, S1, S2 normal, + murmur  Breasts:   normal without suspicious masses, skin or nipple changes or axillary nodes  Abdomen:  normal findings: no organomegaly, soft, non-tender and no hernia obese  Pelvis:  External genitalia: normal general appearance Urinary system: urethral meatus normal and bladder without fullness, nontender Vaginal: normal without tenderness, induration or masses Cervix: normal appearance Adnexa: normal bimanual exam Uterus: anteverted and non-tender, normal size   Lab Review Urine pregnancy test Labs reviewed yes Radiologic studies reviewed yes  50% of 30 min  visit spent on counseling and coordination of care.   Assessment:    Healthy female exam.    Plan:    Education reviewed: depression evaluation, low fat, low cholesterol diet, safe sex/STD prevention, self breast exams, skin cancer screening and weight bearing exercise. Contraception: status post hysterectomy. Mammogram ordered. Follow up in: 2 years.   Meds ordered this encounter  Medications  . cinacalcet (SENSIPAR) 60 MG tablet    Sig: Take 60 mg by mouth daily.   No orders of the defined types were placed in this encounter.

## 2015-02-21 NOTE — Addendum Note (Signed)
Addended by: Lewie Loron D on: 02/21/2015 03:24 PM   Modules accepted: Orders

## 2015-02-24 LAB — PAP IG (IMAGE GUIDED)

## 2015-03-12 ENCOUNTER — Emergency Department (HOSPITAL_COMMUNITY)
Admission: EM | Admit: 2015-03-12 | Discharge: 2015-03-12 | Disposition: A | Discharge status: Home / Self Care | Payer: Medicare Other | Attending: Emergency Medicine | Admitting: Emergency Medicine

## 2015-03-12 ENCOUNTER — Encounter (HOSPITAL_COMMUNITY): Payer: Self-pay | Admitting: Emergency Medicine

## 2015-03-12 DIAGNOSIS — Z87891 Personal history of nicotine dependence: Secondary | ICD-10-CM | POA: Insufficient documentation

## 2015-03-12 DIAGNOSIS — E119 Type 2 diabetes mellitus without complications: Secondary | ICD-10-CM | POA: Diagnosis not present

## 2015-03-12 DIAGNOSIS — Z7982 Long term (current) use of aspirin: Secondary | ICD-10-CM | POA: Insufficient documentation

## 2015-03-12 DIAGNOSIS — Z79899 Other long term (current) drug therapy: Secondary | ICD-10-CM | POA: Insufficient documentation

## 2015-03-12 DIAGNOSIS — G629 Polyneuropathy, unspecified: Secondary | ICD-10-CM | POA: Insufficient documentation

## 2015-03-12 DIAGNOSIS — I471 Supraventricular tachycardia: Secondary | ICD-10-CM

## 2015-03-12 DIAGNOSIS — K219 Gastro-esophageal reflux disease without esophagitis: Secondary | ICD-10-CM | POA: Insufficient documentation

## 2015-03-12 DIAGNOSIS — Z992 Dependence on renal dialysis: Secondary | ICD-10-CM | POA: Insufficient documentation

## 2015-03-12 DIAGNOSIS — E875 Hyperkalemia: Secondary | ICD-10-CM | POA: Diagnosis not present

## 2015-03-12 DIAGNOSIS — Z862 Personal history of diseases of the blood and blood-forming organs and certain disorders involving the immune mechanism: Secondary | ICD-10-CM | POA: Diagnosis not present

## 2015-03-12 DIAGNOSIS — Z794 Long term (current) use of insulin: Secondary | ICD-10-CM | POA: Diagnosis not present

## 2015-03-12 DIAGNOSIS — R0602 Shortness of breath: Secondary | ICD-10-CM | POA: Diagnosis present

## 2015-03-12 DIAGNOSIS — I10 Essential (primary) hypertension: Secondary | ICD-10-CM | POA: Diagnosis not present

## 2015-03-12 DIAGNOSIS — E785 Hyperlipidemia, unspecified: Secondary | ICD-10-CM | POA: Insufficient documentation

## 2015-03-12 DIAGNOSIS — E039 Hypothyroidism, unspecified: Secondary | ICD-10-CM | POA: Diagnosis not present

## 2015-03-12 LAB — BASIC METABOLIC PANEL
Anion gap: 13 (ref 5–15)
BUN: 19 mg/dL (ref 6–20)
CALCIUM: 8.5 mg/dL — AB (ref 8.9–10.3)
CO2: 29 mmol/L (ref 22–32)
Chloride: 96 mmol/L — ABNORMAL LOW (ref 101–111)
Creatinine, Ser: 5.5 mg/dL — ABNORMAL HIGH (ref 0.44–1.00)
GFR calc Af Amer: 9 mL/min — ABNORMAL LOW (ref >60)
GFR, EST NON AFRICAN AMERICAN: 8 mL/min — AB (ref >60)
GLUCOSE: 183 mg/dL — AB (ref 65–99)
Potassium: 6.2 mmol/L (ref 3.5–5.1)
Sodium: 138 mmol/L (ref 135–145)

## 2015-03-12 LAB — CBG MONITORING, ED
Glucose-Capillary: 150 mg/dL — ABNORMAL HIGH (ref 65–99)
Glucose-Capillary: 110 mg/dL — ABNORMAL HIGH (ref 65–99)

## 2015-03-12 LAB — MAGNESIUM: Magnesium: 1.9 mg/dL (ref 1.7–2.4)

## 2015-03-12 LAB — BASIC METABOLIC PANEL WITH GFR
Anion gap: 11 (ref 5–15)
BUN: 22 mg/dL — ABNORMAL HIGH (ref 6–20)
CO2: 31 mmol/L (ref 22–32)
Calcium: 8.2 mg/dL — ABNORMAL LOW (ref 8.9–10.3)
Chloride: 99 mmol/L — ABNORMAL LOW (ref 101–111)
Creatinine, Ser: 5.86 mg/dL — ABNORMAL HIGH (ref 0.44–1.00)
GFR calc Af Amer: 9 mL/min — ABNORMAL LOW
GFR calc non Af Amer: 7 mL/min — ABNORMAL LOW
Glucose, Bld: 112 mg/dL — ABNORMAL HIGH (ref 65–99)
Potassium: 4.3 mmol/L (ref 3.5–5.1)
Sodium: 141 mmol/L (ref 135–145)

## 2015-03-12 LAB — TROPONIN I: Troponin I: <0.03 ng/mL (ref <0.031)

## 2015-03-12 LAB — CBC
HCT: 40.3 % (ref 36.0–46.0)
Hemoglobin: 13 g/dL (ref 12.0–15.0)
MCH: 24.1 pg — ABNORMAL LOW (ref 26.0–34.0)
MCHC: 32.3 g/dL (ref 30.0–36.0)
MCV: 74.6 fL — ABNORMAL LOW (ref 78.0–100.0)
Platelets: 98 10*3/uL — ABNORMAL LOW (ref 150–400)
RBC: 5.4 MIL/uL — ABNORMAL HIGH (ref 3.87–5.11)
RDW: 18.8 % — ABNORMAL HIGH (ref 11.5–15.5)
WBC: 4.1 10*3/uL (ref 4.0–10.5)

## 2015-03-12 LAB — TSH: TSH: 1.907 u[IU]/mL (ref 0.350–4.500)

## 2015-03-12 MED ORDER — INSULIN ASPART 100 UNIT/ML IV SOLN
10.0000 [IU] | Freq: Once | INTRAVENOUS | Status: AC
  Administered 2015-03-12: 10 [IU] via INTRAVENOUS
  Filled 2015-03-12: qty 1

## 2015-03-12 MED ORDER — DEXTROSE 50 % IV SOLN
1.0000 | Freq: Once | INTRAVENOUS | Status: AC
  Administered 2015-03-12: 50 mL via INTRAVENOUS
  Filled 2015-03-12: qty 50

## 2015-03-12 MED ORDER — ALBUTEROL SULFATE (2.5 MG/3ML) 0.083% IN NEBU
10.0000 mg | INHALATION_SOLUTION | Freq: Once | RESPIRATORY_TRACT | Status: AC
  Administered 2015-03-12: 10 mg via RESPIRATORY_TRACT
  Filled 2015-03-12: qty 12

## 2015-03-12 MED ORDER — SODIUM BICARBONATE 8.4 % IV SOLN
50.0000 meq | Freq: Once | INTRAVENOUS | Status: AC
  Administered 2015-03-12: 50 meq via INTRAVENOUS
  Filled 2015-03-12: qty 50

## 2015-03-12 MED ORDER — ADENOSINE 6 MG/2ML IV SOLN
6.0000 mg | Freq: Once | INTRAVENOUS | Status: AC
  Administered 2015-03-12: 6 mg via INTRAVENOUS

## 2015-03-12 NOTE — ED Notes (Signed)
MD at bedside. 

## 2015-03-12 NOTE — ED Provider Notes (Signed)
CSN: [id number]     Arrival date & time 03/12/15  1751 History   First MD Initiated Contact with Patient 03/12/15 1754     Chief Complaint  Patient presents with  . Shortness of Breath    The patient was at home, she had gotten home from [facility] and had just gotten a a full treatment.  The patient said she started to have palpitations.    . Chest Pain    HPI  Patient presents with concern of palpitations, lightheadedness, syncope. Patient completed a typical session of dialysis earlier today, he notes that it was done without complication. Soon after arriving home she felt sudden onset of palpitations, lightheadedness. While transitioning to EMS gurney, patient lost consciousness.  Subsequently she was found hypotensive, but improved with positioning. Since that time she has remained conscious, awake, alert. Currently she describes mild lightheadedness, but no confusion, disorders, nausea. Patient acknowledged a history of SVT, requiring cardioversion in the distant past. Patient's chest pain is minimal, associated with the palpitations, nonradiating, sternal, sore.  Past Medical History  Diagnosis Date  . Diabetes mellitus   . Supraventricular tachycardia   . Renal disorder     Hemodialysis T, TH, Sat  . Thyroid disease   . Renal disorder   . Diabetes mellitus without complication   . Hypertension   . Thyroid disorder   . GERD (gastroesophageal reflux disease)   . Neuropathy   . Hypothyroidism   . Peripheral vascular disease   . Anemia   . Hemodialysis patient   . Hyperlipidemia   . SVT (supraventricular tachycardia)   . Sickle cell trait   . Gastroparesis due to DM    Past Surgical History  Procedure Laterality Date  . Abdominal hysterectomy    . Knee surgery      right  . Thyroid surgery    . Thyroidectomy    . Knee arthroplasty    . Cataract extraction, bilateral    . Colonoscopy  07/19/2012    Procedure: COLONOSCOPY;  Surgeon: Beryle Beams, MD;  Location:  [place];  Service: Endoscopy;  Laterality: N/A;  . Arteriovenous graft placement Left 2012    Dr. Harrington Challenger  2nd graft  . Shuntogram Left 02/14/2013    Procedure: FISTULOGRAM;  Surgeon: Serafina Mitchell, MD;  Location: [facility] CATH LAB;  Service: Cardiovascular;  Laterality: Left;   Family History  Problem Relation Age of Onset  . Diabetes Mother   . Hypertension Mother   . Heart attack Mother   . Diabetes Father   . Hypertension Father    History  Substance Use Topics  . Smoking status: Former Smoker    Types: Cigarettes    Quit date: 09/20/1993  . Smokeless tobacco: Never Used  . Alcohol Use: No   OB History    Gravida Para Term Preterm AB TAB SAB Ectopic Multiple Living            3     Review of Systems  Constitutional:       Per HPI, otherwise negative  HENT:       Per HPI, otherwise negative  Respiratory:       Per HPI, otherwise negative  Cardiovascular:       Per HPI, otherwise negative  Gastrointestinal: Negative for vomiting.  Endocrine:       Negative aside from HPI  Genitourinary:       Neg aside from HPI   Musculoskeletal:       Per HPI, otherwise  negative  Skin: Negative.   Neurological: Positive for syncope and weakness.      Allergies  Review of patient's allergies indicates no known allergies.  Home Medications   Prior to Admission medications   Medication Sig Start Date End Date Taking? Authorizing Provider  aspirin 81 MG chewable tablet Chew 81 mg by mouth daily.    Historical Provider, MD  cinacalcet (SENSIPAR) 60 MG tablet Take 60 mg by mouth daily.    Historical Provider, MD  cloNIDine (CATAPRES) 0.2 MG tablet Take 0.2 mg by mouth 2 (two) times daily. Takes twice daily.  On HD days (MWF), take first dose after HD.    Historical Provider, MD  diphenhydrAMINE (SOMINEX) 25 MG tablet Take 25 mg by mouth daily as needed for itching.    Historical Provider, MD  Efinaconazole (JUBLIA) 10 % SOLN Apply one drop daily to affected nails as  instructed Patient not taking: Reported on 09/25/2014 10/09/13   Harriet Masson, DPM  esomeprazole (NEXIUM) 40 MG capsule Take 40 mg by mouth daily before breakfast.    Historical Provider, MD  insulin lispro protamine-lispro (HUMALOG 75/25) (75-25) 100 UNIT/ML SUSP injection Inject 10-30 Units into the skin 3 (three) times daily. 30 units in the morning, sliding scale in the evening    Historical Provider, MD  labetalol (NORMODYNE) 300 MG tablet Take 300 mg by mouth 2 (two) times daily.    Historical Provider, MD  levothyroxine (SYNTHROID, LEVOTHROID) 125 MCG tablet Take 125 mcg by mouth daily before breakfast.     Historical Provider, MD  NIFEdipine (PROCARDIA XL/ADALAT-CC) 90 MG 24 hr tablet Take 90 mg by mouth daily.    Historical Provider, MD  NOVOFINE 32G X 6 MM MISC  11/27/14   Historical Provider, MD  oxyCODONE-acetaminophen (PERCOCET) 10-325 MG per tablet Take 1 tablet by mouth every 4 (four) hours as needed for pain.    Historical Provider, MD  pravastatin (PRAVACHOL) 40 MG tablet Take 40 mg by mouth at bedtime.     Historical Provider, MD  pregabalin (LYRICA) 150 MG capsule Take 1 capsule (150 mg total) by mouth 2 (two) times daily. 10/09/13   Harriet Masson, DPM  promethazine (PHENERGAN) 25 MG tablet Take 25 mg by mouth every 8 (eight) hours as needed for nausea.    Historical Provider, MD  sevelamer carbonate (RENVELA) 800 MG tablet Take 2,400 mg by mouth 3 (three) times daily with meals.    Historical Provider, MD  sevelamer carbonate (RENVELA) 800 MG tablet Take 1,600 mg by mouth as needed (take with snack).    Historical Provider, MD   SpO2 100% Physical Exam  Constitutional: She is oriented to person, place, and time. She appears well-developed and well-nourished. No distress.  HENT:  Head: Normocephalic and atraumatic.  Eyes: Conjunctivae and EOM are normal.  Cardiovascular: Regular rhythm and intact distal pulses.  Tachycardia present.   Pulmonary/Chest: Effort normal and breath  sounds normal. No stridor. No respiratory distress.  Abdominal: She exhibits no distension.  Musculoskeletal: She exhibits no edema.  Neurological: She is alert and oriented to person, place, and time. No cranial nerve deficit.  Skin: Skin is warm and dry.  Left upper arm fistula with palpable thrill  Psychiatric: She has a normal mood and affect.  Nursing note and vitals reviewed.   ED Course  CARDIOVERSION Date/Time: 03/12/2015 6:26 PM Performed by: Carmin Muskrat Authorized by: Carmin Muskrat Consent: The procedure was performed in an emergent situation. Verbal consent obtained. Risks and benefits: risks, benefits  and alternatives were discussed Consent given by: patient Patient understanding: patient states understanding of the procedure being performed Patient consent: the patient's understanding of the procedure matches consent given Procedure consent: procedure consent matches procedure scheduled Relevant documents: relevant documents present and verified Test results: test results available and properly labeled Site marked: the operative site was marked Imaging studies: imaging studies available Required items: required blood products, implants, devices, and special equipment available Patient identity confirmed: verbally with patient Time out: Immediately prior to procedure a "time out" was called to verify the correct patient, procedure, equipment, support staff and site/side marked as required. Patient sedated: no Cardioversion basis: emergent Pre-procedure rhythm: supraventricular tachycardia Patient position: patient was placed in a supine position Chest area: chest area exposed Number of attempts: 1 (adenosine 6mg  x1) Post-procedure rhythm: normal sinus rhythm Complications: no complications Patient tolerance: Patient tolerated the procedure well with no immediate complications   (including critical care time) Labs Review Labs Reviewed  CBC  BASIC METABOLIC  PANEL  TROPONIN I  MAGNESIUM  TSH   on initial arrival the patient is tachycardic, heart rate 160s, pulse ox symmetry 99% room air normal After discussion with the patient's EMS your team, reviewed rhythm strips, which had similar tachycardia, narrow complex, consistent with SVT. Chart review demonstrates multiple evaluations in the emergency department, including one recent cardioversion.   Imaging Review No results found.   EKG Interpretation   Date/Time:  Wednesday March 12 2015 17:53:33 EDT  #1 Ventricular Rate:  151 PR Interval:    QRS Duration: 82 QT Interval:  315 QTC Calculation: 499 R Axis:   74 Text Interpretation:  Sinus tachycardia Minimal ST depression, inferior  leads Borderline prolonged QT interval Baseline wander in lead(s) V3  Supraventricular tachycardia ST-t wave abnormality Abnormal ekg Confirmed  by Carmin Muskrat  MD [phone]) on 03/12/2015 6:22:36 PM      ECG #2  EKG Interpretation  Date/Time:  Wednesday March 12 2015 18:11:01 EDT Ventricular Rate:  118 PR Interval:  158 QRS Duration: 83 QT Interval:  337 QTC Calculation: 472 R Axis:   72 Text Interpretation:  Sinus tachycardia with irregular rate Minimal ST depression, inferior leads transition from SVT / ST to SR ST-t wave abnormality Abnormal ekg Confirmed by Carmin Muskrat  MD [phone]) on 03/12/2015 6:23:27 PM      ECG #3   EKG Interpretation  Date/Time:  Wednesday March 12 2015 18:11:30 EDT Ventricular Rate:  102 PR Interval:  162 QRS Duration: 84 QT Interval:  361 QTC Calculation: 470 R Axis:   68 Text Interpretation:  Sinus tachycardia Anterior infarct, old Sinus tachycardia ST-t wave abnormality Abnormal ekg Confirmed by Carmin Muskrat  MD ([id number]) on 03/12/2015 6:26:02 PM         6:21 PM Patient tolerated cardioversion w/o complication.  7:02 PM K 6.2 meds ordered  10:12 PM Potassium now normal.  Patient with no new complaints.  We discussed all findings, the need to  follow-up with primary care, nephrology.  MDM  Patient presents with lightheadedness, after an episode of syncope, with ongoing mild chest pain. Patient is found to be in supra ventricular tachycardia. Patient is awake, alert. Patient tolerated adenosine cardioversion well.  However, the patient was found to be hyperkalemic, possibly contributing to her supraventricular tachycardia. Patient received insulin, albuterol, dextrose, bicarbonate. Patient tolerated these interventions well, had repeat check that was reassuring for normalized potassium level. With hours of monitoring, with no recurrence of supraventricular tachycardia, and with resolution of her hyperkalemia,  the patient was discharged in stable condition to follow-up with nephrology, primary care, cardiology.  CRITICAL CARE Performed by: Carmin Muskrat Total critical care time: 45 Critical care time was exclusive of separately billable procedures and treating other patients. Critical care was necessary to treat or prevent imminent or life-threatening deterioration. Critical care was time spent personally by me on the following activities: development of treatment plan with patient and/or surrogate as well as nursing, discussions with consultants, evaluation of patient's response to treatment, examination of patient, obtaining history from patient or surrogate, ordering and performing treatments and interventions, ordering and review of laboratory studies, ordering and review of radiographic studies, pulse oximetry and re-evaluation of patient's condition.     Carmin Muskrat, MD 03/12/15 2213

## 2015-03-12 NOTE — Discharge Instructions (Signed)
As discussed, it is important that you follow up as soon as possible with your physician for continued management of your condition. ° °If you develop any new, or concerning changes in your condition, please return to the emergency department immediately. ° °

## 2015-03-12 NOTE — ED Notes (Signed)
The patient was at home, she had gotten home from [facility] and had just gotten a a full treatment.  The patient said she started to have palpitations.  The patient tried valsva maneuvers and they did not work.  She called EMS and they responded.  The patient was positive for orthostatic, they tired to stand her up and she was weak and they had to hold her up.  She rates her pain 10/10.

## 2015-03-12 NOTE — ED Notes (Signed)
CRITICAL VALUE ALERT  Critical value received:  Potassium 6.2  Date of notification:  03/12/2015  Time of notification:  1903  Critical value read back:Yes.    Nurse who received alert:  MG Braulio Conte, RN  MD notified (1st page):  Dr. Vanita Panda  Time of first page:  1903  MD notified (2nd page):  Time of second page:  Responding MD:  Dr. Vanita Panda  Time MD responded: [phone]

## 2015-03-20 ENCOUNTER — Ambulatory Visit (INDEPENDENT_AMBULATORY_CARE_PROVIDER_SITE_OTHER): Payer: Medicare Other | Admitting: Cardiology

## 2015-03-20 ENCOUNTER — Encounter: Payer: Self-pay | Admitting: Cardiology

## 2015-03-20 VITALS — BP 156/58 | HR 84 | Ht 62.0 in | Wt 216.8 lb

## 2015-03-20 DIAGNOSIS — N186 End stage renal disease: Secondary | ICD-10-CM

## 2015-03-20 DIAGNOSIS — E785 Hyperlipidemia, unspecified: Secondary | ICD-10-CM

## 2015-03-20 DIAGNOSIS — Z992 Dependence on renal dialysis: Secondary | ICD-10-CM

## 2015-03-20 DIAGNOSIS — Z794 Long term (current) use of insulin: Secondary | ICD-10-CM

## 2015-03-20 DIAGNOSIS — I739 Peripheral vascular disease, unspecified: Secondary | ICD-10-CM

## 2015-03-20 DIAGNOSIS — E1122 Type 2 diabetes mellitus with diabetic chronic kidney disease: Secondary | ICD-10-CM

## 2015-03-20 DIAGNOSIS — I471 Supraventricular tachycardia: Secondary | ICD-10-CM | POA: Diagnosis not present

## 2015-03-20 DIAGNOSIS — E669 Obesity, unspecified: Secondary | ICD-10-CM | POA: Diagnosis not present

## 2015-03-20 DIAGNOSIS — I10 Essential (primary) hypertension: Secondary | ICD-10-CM

## 2015-03-20 DIAGNOSIS — E119 Type 2 diabetes mellitus without complications: Secondary | ICD-10-CM

## 2015-03-20 NOTE — Patient Instructions (Signed)
WILL SEND INFORMATION TO DR Marval Regal TO ADJUST MEDICATION - BETA BLOCKER   NO OTHER CHANGES WITH CURRENT MEDICATION.  Your physician wants you to follow-up in OCT 2016 DR HARDING.  You will receive a reminder letter in the mail two months in advance. If you don't receive a letter, please call our office to schedule the follow-up appointment.

## 2015-03-20 NOTE — Progress Notes (Signed)
PATIENT: [name] MRN: [id number] DOB: 10-06-1960 PCP: Ricke Hey, MD  Nephrologist:  Dr. Arty Baumgartner - [facility] Current Cardiologist:  Dr. Hinda Kehr, [facility] - [place], [place]  [id number]  Clinic Note: Chief Complaint  Patient presents with  . New Evaluation    no chest pain (only when SVT comes), no shortness of breath, has edema(dialysis pt), no pain in legs, cramping in legs-has sometimes before dialysis, no lightheadedness, no dizziness  . Tachycardia    History of PSVT -- (fast HR - notes a fluttering, then starts beating fast) h/o -- what else is there to do other than taking Meds.  (had been 1-2  / yr, now > 3 this year); tries vagal maneuvers -- if no relief in 30-40 min -- to ER (adenosine)  . PAD     s/p PTA on both legs (2014) -- notes thigh bruising; no real claudication; just had dopplers this year (looked good)   HPI: [name] is a 54 y.o. female with a PMH below who presents today for past medical history significant for long-standing paroxysmal supraventricular tachycardia, PAD, hyperlipidemia, hypertension, type 2 diabetes mellitus, end-stage renal disease on hemodialysis, GERD, hypothyroidism, morbid obesity, neuropathy, and PEA cardiac arrest during hemodialysis requiring CPR.  Seen by Dr. Terrence Dupont in consult 07/11/2012.   Myoview 05/2008: Negative for inducible or reversible ischemia with pharmacologic stress. 63% ejection fraction.  Echo 06/2012: Left ventricle: The cavity size was normal. Wall thickness was increased in a pattern of mild LVH. Systolic function was normal. The estimated ejection fraction was in the range of 55% to 60%. Wall motion was normal; there were no regional wall motion abnormalities. - Atrial septum: No defect or patent foramen ovale wasidentified.  05/2013:  Admitted for sepsis. Has a Heart MD in [place] - now transferring care. (Records arrived the following day after clinic visit.  I did not get them until several days later and therefore this note is delayed.) -- See PMH/PSH for peripheral angiography and intervention procedures, Echocardiogram and Myoview.   She is now living and working here in [place]. She is looking to transfer care locally.  12/21/2014 & 03/12/2015: ER for SVT -- IV Adenosine - converted.   Interval History: [name] actually is doing quite well overall from a cardiac and PV standpoint. She decided she wanted to establish a cardiologist here locally after a visit to the emergency room for SVT. This was the second episode since April, and third this year She went to the emergency room late in the evening of April 1  complaining of irregular, rapid heartbeat that awoke her from sleep. She attempted to correct her heart rate with vagal maneuvers, but was unsuccessful.  After about an hour, she went to the emergency room because he is starting somewhat symptomatic with dyspnea and dizziness. She did have a little bit of chest pain when her heart rate was very fast. This was her first episode in over a year.She was given 6 mg of IV adenosine with conversion to sinus rhythm.  The remainder of cardiac review of systems is as follows: Cardiovascular ROS: no chest pain or dyspnea on exertion positive for - palpitations, rapid heart rate and Near syncope, associated with rapid SVT negative for - edema, loss of consciousness, orthopnea, paroxysmal nocturnal dyspnea, shortness of breath or TIA/amaurosis fugax   Past Medical History  Diagnosis Date  . Diabetes mellitus, type II, insulin dependent   . Paroxysmal supraventricular tachycardia   .  End stage renal disease on dialysis     Hemodialysis T, TH, Sat  . Hypothyroidism (acquired)   . Essential hypertension     Difficult control. On multiple medications.  . Diabetic peripheral neuropathy associated with type 2 diabetes mellitus   . Peripheral arterial disease December 2014    @ [place]: a) RSFA PTA (01/2013); b) L SFA [place] Atherectomy/PTA with 3V runoff; LEA Dopplers January 2016 [facility] post L SFA PTA): Mild, insignificant disease in the left CFA, profunda, SFA, popliteal artery and tibioperoneal trunk.  . 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 Hemodialysis patient   . Hyperlipidemia   . Sickle cell trait   . Gastroparesis due to DM   . GERD (gastroesophageal reflux disease)     Prior Cardiac Evaluation and Past Surgical History: Past Surgical History  Procedure Laterality Date  . Abdominal hysterectomy    . Knee surgery      right  . Thyroid surgery    . Thyroidectomy    . Knee arthroplasty    . Cataract extraction, bilateral    . Colonoscopy  07/19/2012    Procedure: COLONOSCOPY;  Surgeon: Beryle Beams, MD;  Location: [facility];  Service: Endoscopy;  Laterality: N/A;  . Arteriovenous graft placement Left 2012    Dr. Harrington Challenger  2nd graft  . Shuntogram Left 02/14/2013    Procedure: FISTULOGRAM;  Surgeon: Serafina Mitchell, MD;  Location: [facility] CATH LAB;  Service: Cardiovascular;  Laterality: Left;  . Nm myoview ltd  September 2009; 01/15/2015    a) @ [place] [facility] - Negative for inducible or reversible ischemia with pharmacologic stress. EF 63% ; b) [facility]: EF 68%. No RWMA, negative S4 reversible coronary ischemia or infarction.  . Carotid ultrasound   May 2011    Normal ([place], [place])  . Transthoracic echocardiogram  October 2013; January 2015    a) [name]) 2013: normal LV size and function. Mild LVH. EF 55-60%. no RWMA, No valve disease; b) [place] 1/'15: EF 70-75%, mild MR. Normal diastolic dysfunction  . Peripheral vascular catheterization  May 2014; December 2014    a) RSFA & PopA mod-severe disease -- R SFA PTA, Med Rx of L SFA; b) distal abdominal and iliac & CFA arteries patent.  m-d LSFA long 60%, LPop 60% -> 3 V runoff. RSFA ~40% mid, RPop 60%, 3V runoff  .  Peripheral angioplasty  May 2014    PTA of R SFA-PopA.  Marland Kitchen Peripheral athrectomy  December 2014    [place] Atherectomy of L SFA-L PopA reduction of 60% to 10% stenosis     No Known Allergies  Current Outpatient Prescriptions  Medication Sig Dispense Refill  . aspirin 81 MG chewable tablet Chew 81 mg by mouth daily.    . cinacalcet (SENSIPAR) 60 MG tablet Take 60 mg by mouth daily.    . cloNIDine (CATAPRES) 0.2 MG tablet Take 0.2 mg by mouth 2 (two) times daily. Takes twice daily.  On HD days (MWF), take first dose after HD.    Marland Kitchen Efinaconazole (JUBLIA) 10 % SOLN Apply one drop daily to affected nails as instructed 4 mL 10  . esomeprazole (NEXIUM) 40 MG capsule Take 40 mg by mouth daily before breakfast.    . insulin lispro protamine-lispro (HUMALOG 75/25) (75-25) 100 UNIT/ML SUSP injection Inject 10-30 Units into the skin 3 (three) times daily. 30 units in the morning, sliding scale in the evening    .  labetalol (NORMODYNE) 300 MG tablet Take 300 mg by mouth 2 (two) times daily.    Marland Kitchen levothyroxine (SYNTHROID, LEVOTHROID) 125 MCG tablet Take 125 mcg by mouth daily before breakfast.     . NIFEdipine (PROCARDIA XL/ADALAT-CC) 90 MG 24 hr tablet Take 90 mg by mouth daily.    Marland Kitchen NOVOFINE 32G X 6 MM MISC   0  . oxyCODONE-acetaminophen (PERCOCET) 10-325 MG per tablet Take 1 tablet by mouth every 4 (four) hours as needed for pain.    . pravastatin (PRAVACHOL) 40 MG tablet Take 40 mg by mouth at bedtime.     . pregabalin (LYRICA) 150 MG capsule Take 1 capsule (150 mg total) by mouth 2 (two) times daily. 60 capsule 5  . promethazine (PHENERGAN) 25 MG tablet Take 25 mg by mouth every 8 (eight) hours as needed for nausea.    . sevelamer carbonate (RENVELA) 800 MG tablet Take 2,400 mg by mouth 3 (three) times daily with meals.    Marland Kitchen HUMALOG MIX 75/25 KWIKPEN (75-25) 100 UNIT/ML Kwikpen Use 30 units in the morning, 20 units in the evening if needed if glucose os 180 or above take night time units  0   No  current facility-administered medications for this visit.    Social and Family History:  reports that she quit smoking about 21 years ago. Her smoking use included Cigarettes. She has never used smokeless tobacco. She reports that she uses illicit drugs (Marijuana). She reports that she does not drink alcohol.  family history includes Diabetes in her father and mother; Heart attack in her mother; Hypertension in her father and mother.  ROS: A comprehensive Review of Systems - was performed Review of Systems  Constitutional: Negative for malaise/fatigue.  HENT: Positive for nosebleeds.   Eyes: Negative for blurred vision.  Respiratory: Negative for cough and shortness of breath.   Cardiovascular: Negative for claudication and leg swelling.  Gastrointestinal: Negative for heartburn, constipation and blood in stool.  Genitourinary: Negative.        Is a hemodialysis patient  Musculoskeletal:       Leg cramping. Dialysis  Neurological: Positive for dizziness (Only with SVT).  Endo/Heme/Allergies: Bruises/bleeds easily (Bruising on her legs).  All other systems reviewed and are negative.   PHYSICAL EXAM BP 156/58 mmHg  Pulse 84  Ht 5\' 2"  (1.575 m)  Wt 98.34 kg (216 lb 12.8 oz)  BMI 39.64 kg/m2  HEENT: [place]/AT, EOMI, MMM, anicteric sclera General appearance: alert, cooperative, no distress, moderately obese and otherwise healthy appearing. Normal mood and affect. Neck: no adenopathy, no carotid bruit, no JVD, supple, symmetrical, trachea midline, thyroid not enlarged, symmetric, no tenderness/mass/nodules and Several access site scars from prior dialysis catheters. The fistula related bruit is heard throughout the entire left subclavian system to the heart. Lungs: clear to auscultation bilaterally, normal percussion bilaterally and Nonlabored, good air movement Heart: RRR, normal S1 and S2. Soft S4 gallop. No auscultated M/R.  nondisplaced PMI. Abdomen: soft, non-tender; bowel sounds  normal; no masses,  no organomegaly Extremities: extremities normal, atraumatic, no cyanosis or edema Pulses: 2+ and symmetric Skin: Skin color, texture, turgor normal. No rashes or lesions Neurologic: Alert and oriented X 3, normal strength and tone. Normal symmetric reflexes. Normal coordination and gait Cranial nerves: normal   Adult ECG Report - not checked  Recent Labs: n/a  ASSESSMENT / PLAN: She is a very pleasant woman with PAD and SVT. She has had peripheral angioplasty/atherectomy with recent Dopplers performed. She has had cardiac  evaluation with an echocardiogram and stress test both of which were negative. She is now presenting with recurrent SVT episodes. No active PAD symptoms.  Problem List Items Addressed This Visit    Diabetes mellitus, type II, insulin dependent (Chronic)    On insulin. Managed by PCP/nephrology      Relevant Medications   HUMALOG MIX 75/25 KWIKPEN (75-25) 100 UNIT/ML Kwikpen   End stage renal disease on dialysis due to type 2 diabetes mellitus (Chronic)   Relevant Medications   HUMALOG MIX 75/25 KWIKPEN (75-25) 100 UNIT/ML Kwikpen   Essential hypertension: Associated with renal disease (Chronic)    Not ideally controlled today. Will defer to her nephrologist. Would consider titrating up beta blocker dose to avoid additional medications. Can also increase Imdur to 60 mg, but this should be less effective. Otherwise would need calcium channel blocker or hydralazine. No significant evidence of hypertensive heart disease noted on echo.      Hyperlipidemia (Chronic)    Currently on Zetia plus Crestor. Labs are probably being followed by PCP. I just don't have any recent labs to review.      Obesity    The patient understands the need to lose weight with diet and exercise. We have discussed specific strategies for this.      Relevant Medications   HUMALOG MIX 75/25 KWIKPEN (75-25) 100 UNIT/ML Kwikpen   Paroxysmal supraventricular tachycardia -  Primary (Chronic)    She had been asymptomatic for quite a long time, but now has had over 3 episodes this year. Currently on carvedilol 6.25 mg twice a day.  With a heart rate of 84 bpm and a systolic blood pressure greater than [id number], she certainly could tolerate an increased dose of carvedilol. I will defer to her nephrologist for blood pressure medication titration. We reviewed vagal maneuvers including: Valsalva, cough, carotid massage and cold water. Also reviewed the importance of lying down or sitting down, and getting off the road if driving. Thankfully she has not had any syncopal episodes from SVT. I will for this note to electrophysiology to see if there would be any consideration for timing or appropriateness of  SVT ablation if symptocontinue to recur frequently.      Peripheral arterial disease (Chronic)    As far as I can tell, she only had left lower extremity Dopplers done back in 2015. Reportedly she had Dopplers done more recently than that, but on review it looks that these were venous Dopplers. I will order follow-up lower extremity arterial Dopplers at her 6 month follow-up appointment.  Currently not complaining of claudication. I don't think the bruising she has her legs is anything related to PAD. Is probably more related to how she puts her elbows on her knees when she leans forward.  Continue Plavix (she has been intolerant of aspirin) as well as lipid control with Zetia and Crestor.          Meds ordered this encounter  Medications  . HUMALOG MIX 75/25 KWIKPEN (75-25) 100 UNIT/ML Kwikpen    Sig: Use 30 units in the morning, 20 units in the evening if needed if glucose os 180 or above take night time units    Refill:  0    Followup: 6 months    Manju Kulkarni, Leonie Green, M.D., M.S. Interventional Cardiologist   Pager # [phone]

## 2015-03-26 ENCOUNTER — Encounter: Payer: Self-pay | Admitting: Cardiology

## 2015-03-26 DIAGNOSIS — D631 Anemia in chronic kidney disease: Secondary | ICD-10-CM | POA: Insufficient documentation

## 2015-03-26 DIAGNOSIS — E119 Type 2 diabetes mellitus without complications: Secondary | ICD-10-CM | POA: Insufficient documentation

## 2015-03-26 DIAGNOSIS — E785 Hyperlipidemia, unspecified: Secondary | ICD-10-CM | POA: Insufficient documentation

## 2015-03-26 DIAGNOSIS — Z794 Long term (current) use of insulin: Secondary | ICD-10-CM

## 2015-03-26 DIAGNOSIS — N039 Chronic nephritic syndrome with unspecified morphologic changes: Secondary | ICD-10-CM

## 2015-03-26 DIAGNOSIS — E1142 Type 2 diabetes mellitus with diabetic polyneuropathy: Secondary | ICD-10-CM | POA: Insufficient documentation

## 2015-03-26 DIAGNOSIS — E039 Hypothyroidism, unspecified: Secondary | ICD-10-CM | POA: Insufficient documentation

## 2015-03-26 NOTE — Assessment & Plan Note (Signed)
The patient understands the need to lose weight with diet and exercise. We have discussed specific strategies for this.  

## 2015-03-26 NOTE — Assessment & Plan Note (Addendum)
As far as I can tell, she only had left lower extremity Dopplers done back in 2015. Reportedly she had Dopplers done more recently than that, but on review it looks that these were venous Dopplers. I will order follow-up lower extremity arterial Dopplers at her 6 month follow-up appointment.  Currently not complaining of claudication. I don't think the bruising she has her legs is anything related to PAD. Is probably more related to how she puts her elbows on her knees when she leans forward.  Continue Plavix (she has been intolerant of aspirin) as well as lipid control with Zetia and Crestor.

## 2015-03-26 NOTE — Assessment & Plan Note (Signed)
Currently on Zetia plus Crestor. Labs are probably being followed by PCP. I just don't have any recent labs to review.

## 2015-03-26 NOTE — Assessment & Plan Note (Addendum)
She had been asymptomatic for quite a long time, but now has had over 3 episodes this year. Currently on carvedilol 6.25 mg twice a day.  With a heart rate of 84 bpm and a systolic blood pressure greater than Q000111Q, she certainly could tolerate an increased dose of carvedilol. I will defer to her nephrologist for blood pressure medication titration. We reviewed vagal maneuvers including: Valsalva, cough, carotid massage and cold water. Also reviewed the importance of lying down or sitting down, and getting off the road if driving. Thankfully she has not had any syncopal episodes from SVT. I will for this note to electrophysiology to see if there would be any consideration for timing or appropriateness of  SVT ablation if symptocontinue to recur frequently.

## 2015-03-26 NOTE — Assessment & Plan Note (Signed)
On insulin. Managed by PCP/nephrology

## 2015-03-26 NOTE — Assessment & Plan Note (Signed)
Not ideally controlled today. Will defer to her nephrologist. Would consider titrating up beta blocker dose to avoid additional medications. Can also increase Imdur to 60 mg, but this should be less effective. Otherwise would need calcium channel blocker or hydralazine. No significant evidence of hypertensive heart disease noted on echo.

## 2015-04-01 ENCOUNTER — Emergency Department (HOSPITAL_COMMUNITY)
Admission: EM | Admit: 2015-04-01 | Discharge: 2015-04-01 | Disposition: A | Discharge status: Home / Self Care | Payer: Medicare Other | Attending: Emergency Medicine | Admitting: Emergency Medicine

## 2015-04-01 ENCOUNTER — Emergency Department (HOSPITAL_COMMUNITY): Payer: Medicare Other

## 2015-04-01 ENCOUNTER — Encounter (HOSPITAL_COMMUNITY): Payer: Self-pay

## 2015-04-01 DIAGNOSIS — E039 Hypothyroidism, unspecified: Secondary | ICD-10-CM | POA: Diagnosis not present

## 2015-04-01 DIAGNOSIS — R011 Cardiac murmur, unspecified: Secondary | ICD-10-CM | POA: Insufficient documentation

## 2015-04-01 DIAGNOSIS — Z794 Long term (current) use of insulin: Secondary | ICD-10-CM | POA: Insufficient documentation

## 2015-04-01 DIAGNOSIS — E119 Type 2 diabetes mellitus without complications: Secondary | ICD-10-CM | POA: Diagnosis not present

## 2015-04-01 DIAGNOSIS — Z862 Personal history of diseases of the blood and blood-forming organs and certain disorders involving the immune mechanism: Secondary | ICD-10-CM | POA: Diagnosis not present

## 2015-04-01 DIAGNOSIS — R51 Headache: Secondary | ICD-10-CM | POA: Diagnosis not present

## 2015-04-01 DIAGNOSIS — I120 Hypertensive chronic kidney disease with stage 5 chronic kidney disease or end stage renal disease: Secondary | ICD-10-CM | POA: Insufficient documentation

## 2015-04-01 DIAGNOSIS — R000 Tachycardia, unspecified: Secondary | ICD-10-CM | POA: Diagnosis present

## 2015-04-01 DIAGNOSIS — Z79899 Other long term (current) drug therapy: Secondary | ICD-10-CM | POA: Diagnosis not present

## 2015-04-01 DIAGNOSIS — Z7982 Long term (current) use of aspirin: Secondary | ICD-10-CM | POA: Diagnosis not present

## 2015-04-01 DIAGNOSIS — Z992 Dependence on renal dialysis: Secondary | ICD-10-CM | POA: Insufficient documentation

## 2015-04-01 DIAGNOSIS — E785 Hyperlipidemia, unspecified: Secondary | ICD-10-CM | POA: Insufficient documentation

## 2015-04-01 DIAGNOSIS — I471 Supraventricular tachycardia: Secondary | ICD-10-CM | POA: Diagnosis not present

## 2015-04-01 DIAGNOSIS — K219 Gastro-esophageal reflux disease without esophagitis: Secondary | ICD-10-CM | POA: Insufficient documentation

## 2015-04-01 DIAGNOSIS — E1142 Type 2 diabetes mellitus with diabetic polyneuropathy: Secondary | ICD-10-CM | POA: Diagnosis not present

## 2015-04-01 DIAGNOSIS — Z87891 Personal history of nicotine dependence: Secondary | ICD-10-CM | POA: Diagnosis not present

## 2015-04-01 DIAGNOSIS — N186 End stage renal disease: Secondary | ICD-10-CM | POA: Insufficient documentation

## 2015-04-01 LAB — CBC WITH DIFFERENTIAL/PLATELET
BASOS PCT: 1 % (ref 0–1)
Basophils Absolute: 0 10*3/uL (ref 0.0–0.1)
EOS PCT: 5 % (ref 0–5)
Eosinophils Absolute: 0.3 10*3/uL (ref 0.0–0.7)
HEMATOCRIT: 36.9 % (ref 36.0–46.0)
Hemoglobin: 11.6 g/dL — ABNORMAL LOW (ref 12.0–15.0)
Lymphocytes Relative: 35 % (ref 12–46)
Lymphs Abs: 2.1 10*3/uL (ref 0.7–4.0)
MCH: 23.9 pg — AB (ref 26.0–34.0)
MCHC: 31.4 g/dL (ref 30.0–36.0)
MCV: 75.9 fL — ABNORMAL LOW (ref 78.0–100.0)
MONOS PCT: 10 % (ref 3–12)
Monocytes Absolute: 0.6 10*3/uL (ref 0.1–1.0)
NEUTROS PCT: 50 % (ref 43–77)
Neutro Abs: 3 10*3/uL (ref 1.7–7.7)
PLATELETS: 106 10*3/uL — AB (ref 150–400)
RBC: 4.86 MIL/uL (ref 3.87–5.11)
RDW: 19.9 % — ABNORMAL HIGH (ref 11.5–15.5)
WBC: 6 10*3/uL (ref 4.0–10.5)

## 2015-04-01 LAB — I-STAT CHEM 8, ED
BUN: 39 mg/dL — AB (ref 6–20)
CHLORIDE: 101 mmol/L (ref 101–111)
Calcium, Ion: 1.01 mmol/L — ABNORMAL LOW (ref 1.12–1.23)
Creatinine, Ser: 7.8 mg/dL — ABNORMAL HIGH (ref 0.44–1.00)
GLUCOSE: 83 mg/dL (ref 65–99)
HCT: 41 % (ref 36.0–46.0)
HEMOGLOBIN: 13.9 g/dL (ref 12.0–15.0)
Potassium: 4.8 mmol/L (ref 3.5–5.1)
SODIUM: 139 mmol/L (ref 135–145)
TCO2: 25 mmol/L (ref 0–100)

## 2015-04-01 LAB — BASIC METABOLIC PANEL
ANION GAP: 14 (ref 5–15)
BUN: 39 mg/dL — ABNORMAL HIGH (ref 6–20)
CO2: 26 mmol/L (ref 22–32)
Calcium: 8.8 mg/dL — ABNORMAL LOW (ref 8.9–10.3)
Chloride: 99 mmol/L — ABNORMAL LOW (ref 101–111)
Creatinine, Ser: 8.16 mg/dL — ABNORMAL HIGH (ref 0.44–1.00)
GFR calc Af Amer: 6 mL/min — ABNORMAL LOW (ref >60)
GFR calc non Af Amer: 5 mL/min — ABNORMAL LOW (ref >60)
GLUCOSE: 80 mg/dL (ref 65–99)
POTASSIUM: 4.9 mmol/L (ref 3.5–5.1)
SODIUM: 139 mmol/L (ref 135–145)

## 2015-04-01 LAB — TROPONIN I: Troponin I: <0.03 ng/mL (ref <0.031)

## 2015-04-01 MED ORDER — ADENOSINE 6 MG/2ML IV SOLN
6.0000 mg | Freq: Once | INTRAVENOUS | Status: AC
  Administered 2015-04-01: 6 mg via INTRAVENOUS
  Filled 2015-04-01: qty 2

## 2015-04-01 MED ORDER — ACETAMINOPHEN 500 MG PO TABS
1000.0000 mg | ORAL_TABLET | Freq: Once | ORAL | Status: AC
  Administered 2015-04-01: 1000 mg via ORAL
  Filled 2015-04-01: qty 2

## 2015-04-01 MED ORDER — ADENOSINE 6 MG/2ML IV SOLN
INTRAVENOUS | Status: AC
  Filled 2015-04-01: qty 4

## 2015-04-01 NOTE — ED Notes (Signed)
Pt comes from home via [facility], rapid HR started around 2am, pt has hx of SVT, HR around 144 with EMS, unrelieved by vagal maneuvers, pt is on dialysis, on the 22nd pt was taken off several BP meds by her doctor.

## 2015-04-01 NOTE — ED Provider Notes (Signed)
CSN: [id number]     Arrival date & time 04/01/15  0436 History   First MD Initiated Contact with Patient 04/01/15 [phone]     Chief Complaint  Patient presents with  . Tachycardia     (Consider location/radiation/quality/duration/timing/severity/associated sxs/prior Treatment) HPI  This a 54 year old female with a history of diabetes, paroxysmal SVT, end-stage renal disease on hemodialysis Tuesday, Thursday, and Saturday who presents with palpitations and shortness of breath. Patient reports onset of symptoms at 2 AM. She reports that she tried vagal maneuvers at home without any relief.  She denies any chest pain but reports shortness of breath. She reports that she last went to dialysis yesterday and had a full session. Denies any weakness, numbness, tingling.  Reports general fatigue.  Past Medical History  Diagnosis Date  . Diabetes mellitus, type II, insulin dependent   . Paroxysmal supraventricular tachycardia   . End stage renal disease on dialysis     Hemodialysis T, TH, Sat  . Hypothyroidism (acquired)   . Essential hypertension     Difficult control. On multiple medications.  . Diabetic peripheral neuropathy associated with type 2 diabetes mellitus   . Peripheral arterial disease December 2014    @ [facility]: a) RSFA PTA (01/2013); b) L SFA [place] Atherectomy/PTA with 3V runoff; LEA Dopplers January 2016 [facility] post L SFA PTA): Mild, insignificant disease in the left CFA, profunda, SFA, popliteal artery and tibioperoneal trunk.  . 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 Hemodialysis patient   . Hyperlipidemia   . Sickle cell trait   . Gastroparesis due to DM   . GERD (gastroesophageal reflux disease)    Past Surgical History  Procedure Laterality Date  . Abdominal hysterectomy    . Knee surgery      right  . Thyroid surgery    . Thyroidectomy    . Knee arthroplasty    . Cataract extraction, bilateral     . Colonoscopy  07/19/2012    Procedure: COLONOSCOPY;  Surgeon: Beryle Beams, MD;  Location: [facility];  Service: Endoscopy;  Laterality: N/A;  . Arteriovenous graft placement Left 2012    Dr. Harrington Challenger  2nd graft  . Shuntogram Left 02/14/2013    Procedure: FISTULOGRAM;  Surgeon: Serafina Mitchell, MD;  Location: [facility] CATH LAB;  Service: Cardiovascular;  Laterality: Left;  . Nm myoview ltd  September 2009; 01/15/2015    a) @ [facility] - Negative for inducible or reversible ischemia with pharmacologic stress. EF 63% ; b) [facility]: EF 68%. No RWMA, negative S4 reversible coronary ischemia or infarction.  . Carotid ultrasound   May 2011    Normal ([place], [place])  . Transthoracic echocardiogram  October 2013; January 2015    a) [name]) 2013: normal LV size and function. Mild LVH. EF 55-60%. no RWMA, No valve disease; b) [facility] 1/'15: EF 70-75%, mild MR. Normal diastolic dysfunction  . Peripheral vascular catheterization  May 2014; December 2014    a) RSFA & PopA mod-severe disease -- R SFA PTA, Med Rx of L SFA; b) distal abdominal and iliac & CFA arteries patent.  m-d LSFA long 60%, LPop 60% -> 3 V runoff. RSFA ~40% mid, RPop 60%, 3V runoff  . Peripheral angioplasty  May 2014    PTA of R SFA-PopA.  [facility] Peripheral athrectomy  December 2014    [place] Atherectomy of L SFA-L PopA reduction of 60% to 10% stenosis  Family History  Problem Relation Age of Onset  . Diabetes Mother   . Hypertension Mother   . Heart attack Mother   . Diabetes Father   . Hypertension Father    History  Substance Use Topics  . Smoking status: Former Smoker    Types: Cigarettes    Quit date: 09/20/1993  . Smokeless tobacco: Never Used  . Alcohol Use: No   OB History    Gravida Para Term Preterm AB TAB SAB Ectopic Multiple Living            3     Review of Systems  Constitutional: Positive for fatigue. Negative for fever.  Respiratory: Positive for shortness of breath.  Negative for cough and chest tightness.   Cardiovascular: Positive for palpitations. Negative for chest pain.  Gastrointestinal: Negative for nausea, vomiting and abdominal pain.  Genitourinary: Negative for dysuria.  Neurological: Positive for headaches.  Psychiatric/Behavioral: Negative for confusion.  All other systems reviewed and are negative.     Allergies  Review of patient's allergies indicates no known allergies.  Home Medications   Prior to Admission medications   Medication Sig Start Date End Date Taking? Authorizing Provider  aspirin 81 MG chewable tablet Chew 81 mg by mouth daily.   Yes Historical Provider, MD  cinacalcet (SENSIPAR) 60 MG tablet Take 60 mg by mouth daily.   Yes Historical Provider, MD  cloNIDine (CATAPRES) 0.2 MG tablet Take 0.2 mg by mouth 2 (two) times daily.    Yes Historical Provider, MD  esomeprazole (NEXIUM) 40 MG capsule Take 40 mg by mouth daily before breakfast.   Yes Historical Provider, MD  HUMALOG MIX 75/25 KWIKPEN (75-25) 100 UNIT/ML Kwikpen Use 30 units in the morning, 20 units in the evening if needed if glucose os 180 or above take night time units 02/25/15  Yes Historical Provider, MD  levothyroxine (SYNTHROID, LEVOTHROID) 125 MCG tablet Take 125 mcg by mouth daily before breakfast.    Yes Historical Provider, MD  metoprolol succinate (TOPROL-XL) 100 MG 24 hr tablet Take 100 mg by mouth daily. Take with or immediately following a meal.   Yes Historical Provider, MD  NIFEdipine (PROCARDIA XL/ADALAT-CC) 90 MG 24 hr tablet Take 90 mg by mouth daily.   Yes Historical Provider, MD  oxyCODONE-acetaminophen (PERCOCET) 10-325 MG per tablet Take 1 tablet by mouth every 4 (four) hours as needed for pain.   Yes Historical Provider, MD  pravastatin (PRAVACHOL) 40 MG tablet Take 40 mg by mouth at bedtime.    Yes Historical Provider, MD  pregabalin (LYRICA) 150 MG capsule Take 1 capsule (150 mg total) by mouth 2 (two) times daily. 10/09/13  Yes Richard  Blenda Mounts, DPM  promethazine (PHENERGAN) 25 MG tablet Take 25 mg by mouth every 8 (eight) hours as needed for nausea.   Yes Historical Provider, MD  sevelamer carbonate (RENVELA) 800 MG tablet Take 80-2,400 mg by mouth 3 (three) times daily with meals. Take 800mg  with snacks and 2400 with meals   Yes Historical Provider, MD   BP 118/70 mmHg  Pulse 142  Ht 5\' 4"  (1.626 m)  Wt 216 lb (97.977 kg)  BMI 37.06 kg/m2  SpO2 94% Physical Exam  Constitutional: She is oriented to person, place, and time. No distress.  HENT:  Head: Normocephalic and atraumatic.  Cardiovascular: Regular rhythm.   Murmur heard. Tachycardia  Pulmonary/Chest: Effort normal and breath sounds normal. No respiratory distress. She has no wheezes.  Abdominal: Soft. Bowel sounds are normal. There is no  tenderness. There is no rebound.  Neurological: She is alert and oriented to person, place, and time.  Skin: Skin is warm and dry.  Fistula left upper extremity  Psychiatric: She has a normal mood and affect.  Nursing note and vitals reviewed.   ED Course  Procedures (including critical care time)  CRITICAL CARE Performed by: Merryl Hacker   Total critical care time: 35 min  Critical care time was exclusive of separately billable procedures and treating other patients.  Critical care was necessary to treat or prevent imminent or life-threatening deterioration.  Critical care was time spent personally by me on the following activities: development of treatment plan with patient and/or surrogate as well as nursing, discussions with consultants, evaluation of patient's response to treatment, examination of patient, obtaining history from patient or surrogate, ordering and performing treatments and interventions, ordering and review of laboratory studies, ordering and review of radiographic studies, pulse oximetry and re-evaluation of patient's condition.  Labs Review Labs Reviewed  CBC WITH DIFFERENTIAL/PLATELET -  Abnormal; Notable for the following:    Hemoglobin 11.6 (*)    MCV 75.9 (*)    MCH 23.9 (*)    RDW 19.9 (*)    Platelets 106 (*)    All other components within normal limits  BASIC METABOLIC PANEL - Abnormal; Notable for the following:    Chloride 99 (*)    BUN 39 (*)    Creatinine, Ser 8.16 (*)    Calcium 8.8 (*)    GFR calc non Af Amer 5 (*)    GFR calc Af Amer 6 (*)    All other components within normal limits  I-STAT CHEM 8, ED - Abnormal; Notable for the following:    BUN 39 (*)    Creatinine, Ser 7.80 (*)    Calcium, Ion 1.01 (*)    All other components within normal limits  TROPONIN I    Imaging Review Dg Chest Portable 1 View  04/01/2015   CLINICAL DATA:  STT. Chest discomfort. Shortness of breath tonight.  EXAM: PORTABLE CHEST - 1 VIEW  COMPARISON:  12/20/2014  FINDINGS: Shallow inspiration. Mild cardiac enlargement. Normal pulmonary vascularity. Linear atelectasis in the right mid lung and left lung base. No focal airspace disease or consolidation. No blunting of costophrenic angles. No pneumothorax. Vascular graft in the left axilla.  IMPRESSION: Cardiac enlargement. Linear atelectasis in the right mid lung and left lung base.   Electronically Signed   By: Lucienne Capers M.D.   On: 04/01/2015 05:23     EKG Interpretation  #1  EKG Interpretation  Date/Time:  Tuesday April 01 2015 04:54 EDT Ventricular Rate:  142 PR Interval:   QRS Duration: 76 QT Interval:  312 QTC Calculation: 479 R Axis:   77 Text Interpretation:  Supraventricular tachycardia       #2 Date/Time:  Tuesday April 01 2015 05:01:11 EDT Ventricular Rate:  86 PR Interval:  150 QRS Duration: 76 QT Interval:  370 QTC Calculation: 442 R Axis:   71 Text Interpretation:  Normal sinus rhythm Normal ECG Confirmed by HORTON   MD, COURTNEY ([id number]) on 04/01/2015 6:07:48 AM      MDM   Final diagnoses:  SVT (supraventricular tachycardia)    Patient presents with SVT. History of same. Reports  shortness of breath. Vital signs otherwise reassuring. Patient tried vagal maneuvers in route without success. Last time she converted with 6 of adenosine.  Patient was placed on the monitor. She was given 6 milligrams of  adenosine with conversion of her heart rate. He is currently in the 38s. Now in sinus rhythm. Vital signs remained stable. Basic labwork including troponin reassuring. Chest x-ray shows no evidence of pulmonary edema.  6:54 AM Patient has been asymptomatic since conversion. She states she feels much better. She will follow-up closely with Dr. Ellyn Hack.  After history, exam, and medical workup I feel the patient has been appropriately medically screened and is safe for discharge home. Pertinent diagnoses were discussed with the patient. Patient was given return precautions.     Merryl Hacker, MD 04/01/15 [phone]

## 2015-04-01 NOTE — Discharge Instructions (Signed)
Call your cardiologist to follow-up as soon as possible.  Supraventricular Tachycardia Supraventricular tachycardia (SVT) is an abnormal heart rhythm (arrhythmia) that causes the heart to beat very fast (tachycardia). This kind of fast heartbeat originates in the upper chambers of the heart (atria). SVT can cause the heart to beat greater than 100 beats per minute. SVT can have a rapid burst of heartbeats. This can start and stop suddenly without warning and is called nonsustained. SVT can also be sustained, in which the heart beats at a continuous fast rate.  CAUSES  There can be different causes of SVT. Some of these include:  Heart valve problems such as mitral valve prolapse.  An enlarged heart (hypertrophic cardiomyopathy).  Congenital heart problems.  Heart inflammation (pericarditis).  Hyperthyroidism.  Low potassium or magnesium levels.  Caffeine.  Drug use such as cocaine, methamphetamines, or stimulants.  Some over-the-counter medicines such as:  Decongestants.  Diet medicines.  Herbal medicines. SYMPTOMS  Symptoms of SVT can vary. Symptoms depend on whether the SVT is sustained or nonsustained. You may experience:  No symptoms (asymptomatic).  An awareness of your heart beating rapidly (palpitations).  Shortness of breath.  Chest pain or pressure. If your blood pressure drops because of the SVT, you may experience:  Fainting or near fainting.  Weakness.  Dizziness. DIAGNOSIS  Different tests can be performed to diagnose SVT, such as:  An electrocardiogram (EKG). This is a painless test that records the electrical activity of your heart.  Holter monitor. This is a 24 hour recording of your heart rhythm. You will be given a diary. Write down all symptoms that you have and what you were doing at the time you experienced symptoms.  Arrhythmia monitor. This is a small device that your wear for several weeks. It records the heart rhythm when you have  symptoms.  Echocardiogram. This is an imaging test to help detect abnormal heart structure such as congenital abnormalities, heart valve problems, or heart enlargement.  Stress test. This test can help determine if the SVT is related to exercise.  Electrophysiology study (EPS). This is a procedure that evaluates your heart's electrical system and can help your caregiver find the cause of your SVT. TREATMENT  Treatment of SVT depends on the symptoms, how often it recurs, and whether there are any underlying heart problems.   If symptoms are rare and no other cardiac disease is present, no treatment may be needed.  Blood work may be done to check potassium, magnesium, and thyroid hormone levels to see if they are abnormal. If these levels are abnormal, treatment to correct the problems will occur. Medicines Your caregiver may use oral medicines to treat SVT. These medicines are given for long-term control of SVT. Medicines may be used alone or in combination with other treatments. These medicines work to slow nerve impulses in the heart muscle. These medicines can also be used to treat high blood pressure. Some of these medicines may include:  Calcium channel blockers.  Beta blockers.  Digoxin. Nonsurgical procedures Nonsurgical techniques may be used if oral medicines do not work. Some examples include:  Cardioversion. This technique uses either drugs or an electrical shock to restore a normal heart rhythm.  Cardioversion drugs may be given through an intravenous (IV) line to help "reset" the heart rhythm.  In electrical cardioversion, the caregiver shocks your heart to stop its beat for a split second. This helps to reset the heart to a normal rhythm.  Ablation. This procedure is done under  mild sedation. High frequency radio wave energy is used to destroy the area of heart tissue responsible for the SVT. HOME CARE INSTRUCTIONS   Do not smoke.  Only take medicines prescribed by  your caregiver. Check with your caregiver before using over-the-counter medicines.  Check with your caregiver about how much alcohol and caffeine (coffee, tea, colas, or chocolate) you may have.  It is very important to keep all follow-up referrals and appointments in order to properly manage this problem. SEEK IMMEDIATE MEDICAL CARE IF:  You have dizziness.  You faint or nearly faint.  You have shortness of breath.  You have chest pain or pressure.  You have sudden nausea or vomiting.  You have profuse sweating.  You are concerned about how long your symptoms last.  You are concerned about the frequency of your SVT episodes. If you have the above symptoms, call your local emergency services (911 in [place] immediately. Do not drive yourself to the hospital. MAKE SURE YOU:   Understand these instructions.  Will watch your condition.  Will get help right away if you are not doing well or get worse. Document Released: 09/06/2005 Document Revised: 11/29/2011 Document Reviewed: 12/19/2008 [facility] Patient Information 2015 [place], [place]. This information is not intended to replace advice given to you by your health care provider. Make sure you discuss any questions you have with your health care provider.

## 2015-04-10 ENCOUNTER — Telehealth: Payer: Self-pay | Admitting: *Deleted

## 2015-04-10 NOTE — Telephone Encounter (Signed)
Form states Medicare/Medicaid requires coverage determination for Lyrica in pt's with End-Stage Renal Disease.  I wrote back that pt had not been seen in our office since 09/2013 and had not received Lyrica since 09/2013.

## 2015-04-23 ENCOUNTER — Encounter: Payer: Self-pay | Admitting: *Deleted

## 2015-05-01 NOTE — Progress Notes (Signed)
Refill request for Lyrica 150mg  faxed from [facility] [phone].  Refaxed to [facility], that pt had not been seen in the office since 10/09/2014 and needed to be reevaluated.

## 2015-05-02 ENCOUNTER — Ambulatory Visit: Payer: Medicare Other | Admitting: Vascular Surgery

## 2015-05-08 ENCOUNTER — Encounter: Payer: Self-pay | Admitting: Vascular Surgery

## 2015-05-09 ENCOUNTER — Ambulatory Visit: Payer: Medicare Other | Admitting: Vascular Surgery

## 2015-05-15 ENCOUNTER — Encounter: Payer: Self-pay | Admitting: Vascular Surgery

## 2015-05-16 ENCOUNTER — Other Ambulatory Visit: Payer: Self-pay

## 2015-05-16 ENCOUNTER — Encounter: Payer: Self-pay | Admitting: Vascular Surgery

## 2015-05-16 ENCOUNTER — Ambulatory Visit (INDEPENDENT_AMBULATORY_CARE_PROVIDER_SITE_OTHER): Payer: Medicare Other | Admitting: Vascular Surgery

## 2015-05-16 ENCOUNTER — Ambulatory Visit: Payer: Medicare Other | Admitting: Vascular Surgery

## 2015-05-16 VITALS — BP 162/66 | HR 65 | Temp 97.9°F | Ht 64.0 in | Wt 224.0 lb

## 2015-05-16 DIAGNOSIS — E1122 Type 2 diabetes mellitus with diabetic chronic kidney disease: Secondary | ICD-10-CM | POA: Diagnosis not present

## 2015-05-16 DIAGNOSIS — N186 End stage renal disease: Secondary | ICD-10-CM

## 2015-05-16 DIAGNOSIS — Z992 Dependence on renal dialysis: Secondary | ICD-10-CM | POA: Diagnosis not present

## 2015-05-16 NOTE — Progress Notes (Signed)
Established Dialysis Access  History of Present Illness  [name] is a 54 y.o. (1961/04/02) female who presents for re-evaluation of Left upper arm AVG.  Reportedly this LUA AVG was placed in [place], [place] by Dr. Harrington Challenger.  Those records are not available.  Dr. Trula Slade has venoplastied a prior L SCV stent and placed another stent for a PSA in the past.  The patient denies any steal but does not have bled previously from the arm of thinning over her graft.  She currently denies any recent interventions and denies any current flow issues.  She was seen in the ED on 10/05/14 for a bleeding episode from that LUA AVG  Past Medical History  Diagnosis Date  . Diabetes mellitus, type II, insulin dependent   . Paroxysmal supraventricular tachycardia   . End stage renal disease on dialysis     Hemodialysis T, TH, Sat  . Hypothyroidism (acquired)   . Essential hypertension     Difficult control. On multiple medications.  . Diabetic peripheral neuropathy associated with type 2 diabetes mellitus   . Peripheral arterial disease December 2014    @ [place]: a) RSFA PTA (01/2013); b) L SFA [place] Atherectomy/PTA with 3V runoff; LEA Dopplers January 2016 [facility] post L SFA PTA): Mild, insignificant disease in the left CFA, profunda, SFA, popliteal artery and tibioperoneal trunk.  . 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 Hemodialysis patient   . Hyperlipidemia   . Sickle cell trait   . Gastroparesis due to DM   . GERD (gastroesophageal reflux disease)     Past Surgical History  Procedure Laterality Date  . Abdominal hysterectomy    . Knee surgery      right  . Thyroid surgery    . Thyroidectomy    . Knee arthroplasty    . Cataract extraction, bilateral    . Colonoscopy  07/19/2012    Procedure: COLONOSCOPY;  Surgeon: Beryle Beams, MD;  Location: [facility];  Service: Endoscopy;  Laterality: N/A;  . Arteriovenous graft  placement Left 2012    Dr. Harrington Challenger  2nd graft  . Shuntogram Left 02/14/2013    Procedure: FISTULOGRAM;  Surgeon: Serafina Mitchell, MD;  Location: [facility] CATH LAB;  Service: Cardiovascular;  Laterality: Left;  . Nm myoview ltd  September 2009; 01/15/2015    a) @ [facility] - Negative for inducible or reversible ischemia with pharmacologic stress. EF 63% ; b) [facility]: EF 68%. No RWMA, negative S4 reversible coronary ischemia or infarction.  . Carotid ultrasound   May 2011    Normal ([place], [place])  . Transthoracic echocardiogram  October 2013; January 2015    a) [name]) 2013: normal LV size and function. Mild LVH. EF 55-60%. no RWMA, No valve disease; b) [place] 1/'15: EF 70-75%, mild MR. Normal diastolic dysfunction  . Peripheral vascular catheterization  May 2014; December 2014    a) RSFA & PopA mod-severe disease -- R SFA PTA, Med Rx of L SFA; b) distal abdominal and iliac & CFA arteries patent.  m-d LSFA long 60%, LPop 60% -> 3 V runoff. RSFA ~40% mid, RPop 60%, 3V runoff  . Peripheral angioplasty  May 2014    PTA of R SFA-PopA.  Marland Kitchen Peripheral athrectomy  December 2014    [place] Atherectomy of L SFA-L PopA reduction of 60% to 10% stenosis     Social History   Social History  .  Marital Status: Divorced    Spouse Name: N/A  . Number of Children: 3  . Years of Education: N/A   Occupational History  . Not on file.   Social History Main Topics  . Smoking status: Former Smoker    Types: Cigarettes    Quit date: 09/20/1993  . Smokeless tobacco: Never Used  . Alcohol Use: No  . Drug Use: Yes    Special: Marijuana     Comment: abused drugs in the past  . Sexual Activity: No   Other Topics Concern  . Not on file   Social History Narrative   Drinks tea.   Former smoker   Does not drink alcohol    has 9 grandchildren. Lives alone. Is currently disabled. She does walk routinely.        Family History  Problem Relation Age of Onset  .  Diabetes Mother   . Hypertension Mother   . Heart attack Mother   . Diabetes Father   . Hypertension Father     Current Outpatient Prescriptions  Medication Sig Dispense Refill  . aspirin 81 MG chewable tablet Chew 81 mg by mouth daily.    . cinacalcet (SENSIPAR) 60 MG tablet Take 60 mg by mouth daily.    . cloNIDine (CATAPRES) 0.2 MG tablet Take 0.2 mg by mouth 2 (two) times daily.     Marland Kitchen esomeprazole (NEXIUM) 40 MG capsule Take 40 mg by mouth daily before breakfast.    . HUMALOG MIX 75/25 KWIKPEN (75-25) 100 UNIT/ML Kwikpen Use 30 units in the morning, 20 units in the evening if needed if glucose os 180 or above take night time units  0  . levothyroxine (SYNTHROID, LEVOTHROID) 125 MCG tablet Take 125 mcg by mouth daily before breakfast.     . metoprolol succinate (TOPROL-XL) 100 MG 24 hr tablet Take 100 mg by mouth daily. Take with or immediately following a meal.    . NIFEdipine (PROCARDIA XL/ADALAT-CC) 90 MG 24 hr tablet Take 90 mg by mouth daily.    Marland Kitchen oxyCODONE-acetaminophen (PERCOCET) 10-325 MG per tablet Take 1 tablet by mouth every 4 (four) hours as needed for pain.    . pravastatin (PRAVACHOL) 40 MG tablet Take 40 mg by mouth at bedtime.     . pregabalin (LYRICA) 150 MG capsule Take 1 capsule (150 mg total) by mouth 2 (two) times daily. 60 capsule 5  . promethazine (PHENERGAN) 25 MG tablet Take 25 mg by mouth every 8 (eight) hours as needed for nausea.    . sevelamer carbonate (RENVELA) 800 MG tablet Take 80-2,400 mg by mouth 3 (three) times daily with meals. Take 800mg  with snacks and 2400 with meals    . labetalol (NORMODYNE) 300 MG tablet     . LYRICA 75 MG capsule     . mupirocin ointment (BACTROBAN) 2 %     . NOVOFINE 32G X 6 MM MISC      No current facility-administered medications for this visit.     No Known Allergies   REVIEW OF SYSTEMS:  (Positives checked otherwise negative)  CARDIOVASCULAR:   [ ]  chest pain,  [ ]  chest pressure,  [ ]  palpitations,  [ ]   shortness of breath when laying flat,  [ ]  shortness of breath with exertion,   [ ]  pain in feet when walking,  [ ]  pain in feet when laying flat, [ ]  history of blood clot in veins (DVT),  [ ]  history of phlebitis,  [ ]  swelling  in legs,  [ ]  varicose veins  PULMONARY:   [ ]  productive cough,  [ ]  asthma,  [ ]  wheezing  NEUROLOGIC:   [ ]  weakness in arms or legs,  [ ]  numbness in arms or legs,  [ ]  difficulty speaking or slurred speech,  [ ]  temporary loss of vision in one eye,  [ ]  dizziness  HEMATOLOGIC:   [ ]  bleeding problems,  [ ]  problems with blood clotting too easily  MUSCULOSKEL:   [ ]  joint pain, [ ]  joint swelling  GASTROINTEST:   [ ]  vomiting blood,  [ ]  blood in stool     GENITOURINARY:   [ ]  burning with urination,  [ ]  blood in urine [x]  ESRD-HD: T-R-S  PSYCHIATRIC:   [ ]  history of major depression  INTEGUMENTARY:   [ ]  rashes,  [ ]  ulcers  CONSTITUTIONAL:   [ ]  fever,  [ ]  chills     Physical Examination  Filed Vitals:   05/16/15 1041  BP: 162/66  Pulse: 65  Temp: 97.9 F (36.6 C)  TempSrc: Oral  Height: 5\' 4"  (1.626 m)  Weight: 224 lb (101.606 kg)  SpO2: 94%   Body mass index is 38.43 kg/(m^2).  General: A&O x 3, WD, obese  Pulmonary: Sym exp, good air movt, CTAB, no rales, rhonchi, & wheezing  Cardiac: RRR, Nl S1, S2, no Murmurs, rubs or gallops  Vascular: Vessel Right Left  Radial Palpable Palpable  Ulnar Not Palpable Not Palpable  Brachial Palpable Palpable   Gastrointestinal: soft, NTND, no G/R, bo HSM, no masses, no CVAT B  Musculoskeletal: M/S 5/5 throughout , Extremities without  ischemic changes , + palpable thrill in access in L UA, + bruit in access: no obvious stenotic flow, moderate size PSA over distal 1/3 of LUA AVG  Neurologic: Pain and light touch intact in extremities , Motor exam as listed above   Medical Decision Making  [name] is a 54 y.o. female who presents with ESRD requiring  hemodialysis, LUA AVG s/p L SCV stenting & PSA stenting, LUA AVG PSA   Unfortunately, unclear exactly what procedures done on this LUA AVG due to the multiple locations interventions are taking place.  Would obtain a L UA shuntogram, possible intervention.    If the central venous structures are adequately patent, could consider immediate revision of this graft with an interposition segment otherwise another access will need to be consider at another point.  The patient has agreed to proceed with the above procedure which will be scheduled this coming month.   Adele Barthel, MD [facility] Office: [phone] Pager: [phone]  05/16/2015, 12:07 PM

## 2015-06-03 ENCOUNTER — Encounter (HOSPITAL_COMMUNITY): Payer: Self-pay | Admitting: *Deleted

## 2015-06-03 NOTE — Progress Notes (Signed)
Pt denies SOB and chest pain but is under the care of Dr. Ellyn Hack, cardiology. Pt stated that she was given instructions regarding insulin administration from surgeons office. Pt made aware to stop otc vitamins, NSAID's and herbal medications. Pt verbalized understanding of all pre-op instructions. Ebony Hail, [place], anesthesia, asked to review pt cardiac history.

## 2015-06-03 NOTE — Progress Notes (Signed)
Anesthesia Chart Review: SAME DAY WORK-UP.  Patient is a 54 year old female scheduled for fistulogram, possible intervention (Left), revision of LUE AVGG tomorrow by Dr. Bridgett Larsson. LUE AVGG was originally placed in [facility].   History includes ESRD on HD MWF, former smoker, DM2 with neuropathy and gastroparesis, long standing PSVT (last episode 04/01/15 that did not respond to home vagal maneuvers and converted after 6 mg IV adenosine in the ED), thyroidectomy with secondary hypothyroidism, HTN, PAD s/p left SFA PTA '14, Sickle Cell trait, HLD, GERD, hysterectomy. PEA arrest during hemodialysis in 06/2012 and required 5-10 minutes of CPR prior to arrival of EMS. She was intubated in the field and admitted to [facility] in acute pulmonary edema.  BMI is consistent with obesity.   PCP is listed as Dr. Ricke Hey. Cardiologist is Dr. Glenetta Hew, newly established on 03/26/15--she wanted a local cardiologist for follow-up since she was having recurrent SVT episodes. Med management to control her symptoms has been more challenging due to hypotension with hemodialysis. Dr. Griselda Tosh Quarry note said he could consider referral to EP cardiology for consideration of SVT ablation if her SVT episodes become more frequent. She is scheduled to see Dr. Ellyn Hack in follow-up on 06/23/15.   Meds include ASA 81 mg, Sensipar, clonidine, Nexium, levothyroxine, Humalog, Toprol, Nifedipine, oxycodone, pravastatin, Lyrica, promethazine, Renvela.  01/13/15 Nuclear stress test [facility], [place]): Negative stress sestamibi test for reversible coronary ischemia or scar, normal LV systolic function. LVEF 68%.  10/01/14 Echo [facility], [place]): LVEF 70-75%, normal LV wall motion, mild MR, trace TR.  04/01/15 1V CXR: IMPRESSION: Cardiac enlargement. Linear atelectasis in the right mid lung and left lung base.  She is for labs on arrival.   She has a long standing history of PSVT. Stress and echo earlier this year were unremarkable. She is  on b-blocker therapy. If no new/recurrent arrhythmias and labs are acceptable then I would anticipate that she could proceed as planned.  George Hugh [facility] Short Stay Center/Anesthesiology Phone [phone] 06/03/2015 12:11 PM

## 2015-06-04 ENCOUNTER — Ambulatory Visit (HOSPITAL_COMMUNITY): Payer: Medicare Other | Admitting: Vascular Surgery

## 2015-06-04 ENCOUNTER — Encounter (HOSPITAL_COMMUNITY): Payer: Self-pay | Admitting: Certified Registered"

## 2015-06-04 ENCOUNTER — Ambulatory Visit (HOSPITAL_COMMUNITY)
Admission: RE | Admit: 2015-06-04 | Discharge: 2015-06-04 | Disposition: A | Discharge status: Home / Self Care | Payer: Medicare Other | Source: Ambulatory Visit | Attending: Vascular Surgery | Admitting: Vascular Surgery

## 2015-06-04 ENCOUNTER — Encounter (HOSPITAL_COMMUNITY)
Admission: RE | Disposition: A | Discharge status: Home / Self Care | Payer: Self-pay | Source: Ambulatory Visit | Attending: Vascular Surgery

## 2015-06-04 DIAGNOSIS — N186 End stage renal disease: Secondary | ICD-10-CM | POA: Insufficient documentation

## 2015-06-04 DIAGNOSIS — Z992 Dependence on renal dialysis: Secondary | ICD-10-CM | POA: Diagnosis not present

## 2015-06-04 DIAGNOSIS — E1122 Type 2 diabetes mellitus with diabetic chronic kidney disease: Secondary | ICD-10-CM | POA: Diagnosis not present

## 2015-06-04 DIAGNOSIS — I120 Hypertensive chronic kidney disease with stage 5 chronic kidney disease or end stage renal disease: Secondary | ICD-10-CM | POA: Diagnosis not present

## 2015-06-04 DIAGNOSIS — Z794 Long term (current) use of insulin: Secondary | ICD-10-CM | POA: Insufficient documentation

## 2015-06-04 DIAGNOSIS — D573 Sickle-cell trait: Secondary | ICD-10-CM | POA: Diagnosis not present

## 2015-06-04 DIAGNOSIS — Z7982 Long term (current) use of aspirin: Secondary | ICD-10-CM | POA: Insufficient documentation

## 2015-06-04 DIAGNOSIS — E039 Hypothyroidism, unspecified: Secondary | ICD-10-CM | POA: Diagnosis not present

## 2015-06-04 DIAGNOSIS — E785 Hyperlipidemia, unspecified: Secondary | ICD-10-CM | POA: Insufficient documentation

## 2015-06-04 DIAGNOSIS — Z87891 Personal history of nicotine dependence: Secondary | ICD-10-CM | POA: Insufficient documentation

## 2015-06-04 DIAGNOSIS — E1142 Type 2 diabetes mellitus with diabetic polyneuropathy: Secondary | ICD-10-CM | POA: Diagnosis not present

## 2015-06-04 DIAGNOSIS — T82898A Other specified complication of vascular prosthetic devices, implants and grafts, initial encounter: Secondary | ICD-10-CM | POA: Diagnosis not present

## 2015-06-04 HISTORY — PX: FISTULOGRAM: SHX5832

## 2015-06-04 HISTORY — DX: Cardiac murmur, unspecified: R01.1

## 2015-06-04 HISTORY — DX: Unspecified osteoarthritis, unspecified site: M19.90

## 2015-06-04 HISTORY — PX: REVISION OF ARTERIOVENOUS GORETEX GRAFT: SHX6073

## 2015-06-04 LAB — POCT I-STAT 4, (NA,K, GLUC, HGB,HCT)
Glucose, Bld: 89 mg/dL (ref 65–99)
HCT: 45 % (ref 36.0–46.0)
Hemoglobin: 15.3 g/dL — ABNORMAL HIGH (ref 12.0–15.0)
Potassium: 5.9 mmol/L — ABNORMAL HIGH (ref 3.5–5.1)
Sodium: 136 mmol/L (ref 135–145)

## 2015-06-04 LAB — GLUCOSE, CAPILLARY
GLUCOSE-CAPILLARY: 115 mg/dL — AB (ref 65–99)
GLUCOSE-CAPILLARY: 82 mg/dL (ref 65–99)

## 2015-06-04 SURGERY — FISTULOGRAM
Anesthesia: General | Site: Arm Upper | Laterality: Left

## 2015-06-04 MED ORDER — DEXTROSE 5 % IV SOLN
1.5000 g | INTRAVENOUS | Status: AC
  Administered 2015-06-04: 1.5 g via INTRAVENOUS

## 2015-06-04 MED ORDER — THROMBIN 20000 UNITS EX SOLR
CUTANEOUS | Status: AC
  Filled 2015-06-04: qty 20000

## 2015-06-04 MED ORDER — LIDOCAINE HCL (CARDIAC) 20 MG/ML IV SOLN
INTRAVENOUS | Status: DC | PRN
  Administered 2015-06-04: 80 mg via INTRAVENOUS

## 2015-06-04 MED ORDER — PROTAMINE SULFATE 10 MG/ML IV SOLN
INTRAVENOUS | Status: DC | PRN
  Administered 2015-06-04 (×3): 10 mg via INTRAVENOUS

## 2015-06-04 MED ORDER — HEPARIN SODIUM (PORCINE) 1000 UNIT/ML IJ SOLN
INTRAMUSCULAR | Status: DC | PRN
  Administered 2015-06-04: 3000 [IU] via INTRAVENOUS
  Administered 2015-06-04: 6000 [IU] via INTRAVENOUS

## 2015-06-04 MED ORDER — HYDROMORPHONE HCL 1 MG/ML IJ SOLN
0.2500 mg | INTRAMUSCULAR | Status: DC | PRN
  Administered 2015-06-04 (×2): 0.5 mg via INTRAVENOUS

## 2015-06-04 MED ORDER — MIDAZOLAM HCL 2 MG/2ML IJ SOLN
INTRAMUSCULAR | Status: AC
  Filled 2015-06-04: qty 4

## 2015-06-04 MED ORDER — PROTAMINE SULFATE 10 MG/ML IV SOLN
INTRAVENOUS | Status: AC
  Filled 2015-06-04: qty 5

## 2015-06-04 MED ORDER — FENTANYL CITRATE (PF) 250 MCG/5ML IJ SOLN
INTRAMUSCULAR | Status: DC | PRN
  Administered 2015-06-04 (×4): 25 ug via INTRAVENOUS

## 2015-06-04 MED ORDER — OXYCODONE-ACETAMINOPHEN 10-325 MG PO TABS: 1.0000 | ORAL_TABLET | Freq: Four times a day (QID) | ORAL | Status: DC | PRN

## 2015-06-04 MED ORDER — HEPARIN SODIUM (PORCINE) 1000 UNIT/ML IJ SOLN
INTRAMUSCULAR | Status: AC
  Filled 2015-06-04: qty 1

## 2015-06-04 MED ORDER — PROPOFOL 10 MG/ML IV BOLUS
INTRAVENOUS | Status: AC
  Filled 2015-06-04: qty 20

## 2015-06-04 MED ORDER — ONDANSETRON HCL 4 MG/2ML IJ SOLN
INTRAMUSCULAR | Status: DC | PRN
  Administered 2015-06-04 (×2): 4 mg via INTRAVENOUS

## 2015-06-04 MED ORDER — CHLORHEXIDINE GLUCONATE CLOTH 2 % EX PADS: 6.0000 | MEDICATED_PAD | Freq: Once | CUTANEOUS | Status: DC

## 2015-06-04 MED ORDER — LIDOCAINE HCL (CARDIAC) 20 MG/ML IV SOLN
INTRAVENOUS | Status: AC
  Filled 2015-06-04: qty 5

## 2015-06-04 MED ORDER — 0.9 % SODIUM CHLORIDE (POUR BTL) OPTIME
TOPICAL | Status: DC | PRN
  Administered 2015-06-04: 1000 mL

## 2015-06-04 MED ORDER — SODIUM CHLORIDE 0.9 % IR SOLN
Status: DC | PRN
  Administered 2015-06-04: 500 mL

## 2015-06-04 MED ORDER — PROMETHAZINE HCL 25 MG/ML IJ SOLN: 6.2500 mg | INTRAMUSCULAR | Status: DC | PRN

## 2015-06-04 MED ORDER — HYDROMORPHONE HCL 1 MG/ML IJ SOLN
INTRAMUSCULAR | Status: AC
  Filled 2015-06-04: qty 1

## 2015-06-04 MED ORDER — ONDANSETRON HCL 4 MG/2ML IJ SOLN
INTRAMUSCULAR | Status: AC
  Filled 2015-06-04: qty 4

## 2015-06-04 MED ORDER — THROMBIN 20000 UNITS EX SOLR
CUTANEOUS | Status: DC | PRN
  Administered 2015-06-04: 20 mL via TOPICAL

## 2015-06-04 MED ORDER — SODIUM CHLORIDE 0.9 % IV SOLN
INTRAVENOUS | Status: DC
  Administered 2015-06-04 (×2): via INTRAVENOUS

## 2015-06-04 MED ORDER — FENTANYL CITRATE (PF) 250 MCG/5ML IJ SOLN
INTRAMUSCULAR | Status: AC
  Filled 2015-06-04: qty 5

## 2015-06-04 MED ORDER — DEXTROSE 5 % IV SOLN
INTRAVENOUS | Status: AC
  Filled 2015-06-04: qty 1.5

## 2015-06-04 MED ORDER — PROPOFOL 10 MG/ML IV BOLUS
INTRAVENOUS | Status: DC | PRN
  Administered 2015-06-04: 150 mg via INTRAVENOUS

## 2015-06-04 MED ORDER — IOHEXOL 300 MG/ML  SOLN
INTRAMUSCULAR | Status: DC | PRN
  Administered 2015-06-04: 25 mL via INTRAVENOUS

## 2015-06-04 SURGICAL SUPPLY — 54 items
BAG BANDED W/RUBBER/TAPE 36X54 (MISCELLANEOUS) ×2 IMPLANT
CANISTER SUCTION 2500CC (MISCELLANEOUS) ×2 IMPLANT
CATH ANGIO 5F BER2 65CM (CATHETERS) ×2 IMPLANT
CLIP TI MEDIUM 6 (CLIP) ×2 IMPLANT
CLIP TI WIDE RED SMALL 6 (CLIP) ×2 IMPLANT
COVER DOME SNAP 22 D (MISCELLANEOUS) ×2 IMPLANT
COVER PROBE W GEL 5X96 (DRAPES) ×2 IMPLANT
DECANTER SPIKE VIAL GLASS SM (MISCELLANEOUS) ×2 IMPLANT
DRSG TEGADERM 4X4.75 (GAUZE/BANDAGES/DRESSINGS) ×2 IMPLANT
ELECT REM PT RETURN 9FT ADLT (ELECTROSURGICAL) ×2
ELECTRODE REM PT RTRN 9FT ADLT (ELECTROSURGICAL) ×1 IMPLANT
GEL ULTRASOUND 20GR AQUASONIC (MISCELLANEOUS) ×2 IMPLANT
GLOVE BIO SURGEON STRL SZ7 (GLOVE) ×2 IMPLANT
GLOVE BIOGEL PI IND STRL 7.5 (GLOVE) ×1 IMPLANT
GLOVE BIOGEL PI INDICATOR 7.5 (GLOVE) ×1
GLOVE SURG SS PI 6.5 STRL IVOR (GLOVE) ×4 IMPLANT
GOWN STRL REUS W/ TWL LRG LVL3 (GOWN DISPOSABLE) ×3 IMPLANT
GOWN STRL REUS W/TWL LRG LVL3 (GOWN DISPOSABLE) ×3
GRAFT GORETEX STND 6X20 (Vascular Products) ×2 IMPLANT
GRAFT GORETEXSTD 6X20 (Vascular Products) ×1 IMPLANT
GUIDEWIRE ANGLED .035X150CM (WIRE) ×2 IMPLANT
INTRODUCER COOK 11FR (CATHETERS) IMPLANT
INTRODUCER SET COOK 14FR (MISCELLANEOUS) IMPLANT
KIT BASIN OR (CUSTOM PROCEDURE TRAY) ×2 IMPLANT
KIT ENCORE 26 ADVANTAGE (KITS) IMPLANT
KIT ROOM TURNOVER OR (KITS) ×2 IMPLANT
LIQUID BAND (GAUZE/BANDAGES/DRESSINGS) ×2 IMPLANT
NEEDLE HYPO 25GX1X1/2 BEV (NEEDLE) ×2 IMPLANT
NEEDLE PERC 18GX7CM (NEEDLE) ×2 IMPLANT
NS IRRIG 1000ML POUR BTL (IV SOLUTION) ×2 IMPLANT
PACK CV ACCESS (CUSTOM PROCEDURE TRAY) ×2 IMPLANT
PAD ARMBOARD 7.5X6 YLW CONV (MISCELLANEOUS) ×4 IMPLANT
PROTECTION STATION PRESSURIZED (MISCELLANEOUS) ×2
SET INTRODUCER 12FR PACEMAKER (SHEATH) IMPLANT
SET MICROPUNCTURE 5F STIFF (MISCELLANEOUS) ×2 IMPLANT
SHEATH PINNACLE R/O II 6F 4CM (SHEATH) ×2 IMPLANT
SPONGE SURGIFOAM ABS GEL 100 (HEMOSTASIS) IMPLANT
STATION PROTECTION PRESSURIZED (MISCELLANEOUS) ×1 IMPLANT
SUT GORETEX 5 0 TT13 24 (SUTURE) ×2 IMPLANT
SUT MNCRL AB 4-0 PS2 18 (SUTURE) ×4 IMPLANT
SUT PROLENE 5 0 C 1 24 (SUTURE) ×6 IMPLANT
SUT PROLENE 6 0 BV (SUTURE) ×4 IMPLANT
SUT PROLENE 7 0 BV 1 (SUTURE) IMPLANT
SUT SILK 2 0 FS (SUTURE) IMPLANT
SUT SILK 2 0 SH (SUTURE) ×2 IMPLANT
SUT VIC AB 3-0 SH 27 (SUTURE) ×4
SUT VIC AB 3-0 SH 27X BRD (SUTURE) ×4 IMPLANT
SYR 20CC LL (SYRINGE) ×2 IMPLANT
TUBING CIL FLEX 10 FLL-RA (TUBING) ×2 IMPLANT
UNDERPAD 30X30 INCONTINENT (UNDERPADS AND DIAPERS) ×2 IMPLANT
WATER STERILE IRR 1000ML POUR (IV SOLUTION) ×2 IMPLANT
WIRE BENTSON .035X145CM (WIRE) ×4 IMPLANT
WIRE HI TORQ VERSACORE J 260CM (WIRE) ×2 IMPLANT
WIRE ROSEN 145CM (WIRE) ×2 IMPLANT

## 2015-06-04 NOTE — Op Note (Signed)
OPERATIVE NOTE   PROCEDURE: 1. Left upper arm arteriovenous graft cannulation with ultrasound guidance 2. Left arm shuntogram  3. Revision of left upper arm arteriovenous graft   PRE-OPERATIVE DIAGNOSIS: Large left arm pseudoaneurysm  POST-OPERATIVE DIAGNOSIS: same as above   SURGEON: Adele Barthel, MD  ASSISTANT(S): Leontine Locket, PAC   ANESTHESIA: general  ESTIMATED BLOOD LOSS: 50 cc  FINDING(S): 1.  Patent upper arm arteriovenous graft  2.  Fractured left upper arm graft stent at pseudoaneurysm site 3.  Widely patent stented left subclavian and axillary vein 4.  Hybrid fistula/graft:  Distal access is dilated vein, proximally is a 6 mm PTFE  SPECIMEN(S):  none  INDICATIONS:   [name] is a 54 y.o. female who presents with large pseudoaneurysm in her left upper arm arteriovenous graft with prior bleeding complication.  I felt that further evaluation with a shuntogram and possible endovascular intervention was necessary,  Depending on the findings, revision of the left upper arm arteriovenous graft might be done.  I discussed with the patient the nature of angiographic procedures, especially the limited patencies of any endovascular intervention.  The patient is aware of that the risks of an angiographic procedure include but are not limited to: bleeding, infection, access site complications, renal failure, embolization, rupture of vessel, dissection, possible need for emergent surgical intervention, possible need for surgical procedures to treat the patient's pathology, anaphylactic reaction to contrast, and stroke and death.  Risk, benefits, and alternatives to access surgery were discussed.  The patient is aware the risks include but are not limited to: bleeding, infection, steal syndrome, nerve damage, ischemic monomelic neuropathy, failure to mature, need for additional procedures, death and stroke.  The patient explicitly refused tunneled dialysis catheter placement.   The patient agrees to proceed forward with the procedure.   DESCRIPTION: After obtaining full informed written consent, the patient was brought back to the operating room and placed supine upon the operating table.  The patient received IV antibiotics prior to induction.  After obtaining adequate anesthesia, the patient was prepped and draped in the standard fashion for: a left arm access procedure.  Under ultrasound guidance, I cannulated the left arm access near the antecubitum under ultrasound guidance with a micropuncture needle.  I loaded a microwire which curled in the pseudoaneurysm.  I loaded a microsheath over the wire.  I connected IV tubing to the sheath.  The left arm shuntogram was done in station with hand injections.  This demonstrated a fractured stent at the site of the pseudoaneurysm.  I placed a Rosen wire into the pseudoaneurysm via the sheath.  The microsheath was exchanged for a short 6-Fr sheath.  I loaded a Versacore wire and BER-2 catheter.  Using these, I could not cross the fractured stent.  I then exchanged the wire for a Glidewire and using this combination, I crossed the fractured stent.  The catheter would not not track, suggesting the wire had crossed a strut or the angulation of the fractured stent was jailing the catheter.  I did not think that crossing this fracture stent safely was possible.  This graft will need to be salvaged with an interposition graft.  I removed the wire and catheter.  I then made an incision longitudinally over the antecubital segment of this graft.  Using blunt dissection and electrocautery, I dissected out the graft at this site.  I was able to mobilize a 5 cm segment of graft.  I then made an incision longitudinally over  the distal segment of this graft.  Using blunt dissection and electrocautery, I encountered extensive dense scarring.  Eventually, I dissected out a large vein which was connected to the graft, suggesting this is a prior fistula  with a proximalization procedure completed with graft.  I dissected out a 5 cm segment of this proximal vein.    I then bluntly dissected the start of a subcutaneous tunnel in both incision.  I then dissected a subcutaneous tunnel from the antecubital incision to the proximal incision with a Gore tunneler.  I passed a 6 mm Goretex graft through this metal tunnel, taking care to maintain orientation.  I removed the tunnel, leaving the graft in place.  I gave the patient 9000 units of Heparin intravenously.   At this point, I clamped the graft proximally.  I tied off the graft distally in the antecubital incision.  I transected the graft in this incision, spatulating the graft for an end-to-end anastomosis in the process.   I spatulated the new graft to facilitate an end-to-end anastomosis.  The new graft was sewn to the old graft with a running stitch of CV-6.   I then pulled the graft to tension in the proximal incision.  I then tied off the fistula/graft proximally in this incision with two 2-0 ties.  I clamped the vein toward the axilla.  I transected this vein.  It was quite thickened and enlarged.  It became evident that this segment had a large pseudoaneurysm.  I dissected out this vein toward the axilla.  I reclamped the vein and transected off the pseudoaneurysm.  I spatulated this vein and the new graft to facilitate an end-to-end anastomosis.  The graft was sewn to the vein with a running stitch of 5-0 Prolene.  Prior to completing this anastomosis, I backbled the hybrid graft/fistula: no clot was present.  I also backbled the vein: adequate backbleeding was present without thrombus.  I completed this anastomosis in the usual fashion.  I released all clamps.  Thrombin and gelfoam was packed into all incisions.  The patient was given 50 mg of Protamine.    After waiting a few minutes, there was no further bleeding in the antecubital incision.  This was repaired with a double layer of 3-0 Vicryl and a  layer of 4-0 Monocryl in a subcuticular fashion.  The skin was cleaned, dried, and reinforced with Dermabond.  The proximal incisions had continued bleeding from the anastomosis.  I repaired the bleeding point with a figure of 8 stitch in the suture line.  At this point, I repacked the incisions with thrombin and gelfoam.  After waiting a few minutes, the bleeding stopped.  The incision was repaired with a layer of 3-0 Vicryl and a layer of 4-0 Vicryl in a subcuticular fashion.  The skin was cleaned, dried, and reinforced with Dermabond.  The doppler exam demonstrated intact brachial and radial flow signals.  The proximal vein signal was consistent with patent arteriovenous graft with faint pulsatile character.   COMPLICATIONS: none  CONDITION: stable   Adele Barthel, MD [facility] [place] Office: [phone] Pager: [phone]  06/04/2015, 2:58 PM

## 2015-06-04 NOTE — Transfer of Care (Signed)
Immediate Anesthesia Transfer of Care Note  Patient: [name]  Procedure(s) Performed: Procedure(s): FISTULOGRAM (Left) REVISION OF ARTERIOVENOUS GORETEX GRAFT (Left)  Patient Location: PACU  Anesthesia Type:General  Level of Consciousness: sedated and patient cooperative  Airway & Oxygen Therapy: Patient Spontanous Breathing and Patient connected to nasal cannula oxygen  Post-op Assessment: Report given to RN, Post -op Vital signs reviewed and stable and Patient moving all extremities  Post vital signs: Reviewed and stable  Last Vitals:  Filed Vitals:   06/04/15 1027  BP: 167/65  Pulse: 60  Temp: 36.6 C  Resp: 18    Complications: No apparent anesthesia complications

## 2015-06-04 NOTE — Anesthesia Postprocedure Evaluation (Signed)
Anesthesia Post Note  Patient: [name]  Procedure(s) Performed: Procedure(s) (LRB): FISTULOGRAM (Left) REVISION OF ARTERIOVENOUS GORETEX GRAFT (Left)  Anesthesia type: general  Patient location: PACU  Post pain: Pain level controlled  Post assessment: Patient's Cardiovascular Status Stable  Last Vitals:  Filed Vitals:   06/04/15 1645  BP: 136/54  Pulse:   Temp:   Resp:     Post vital signs: Reviewed and stable  Level of consciousness: sedated  Complications: No apparent anesthesia complications

## 2015-06-04 NOTE — Anesthesia Preprocedure Evaluation (Addendum)
Anesthesia Evaluation  Patient identified by MRN, date of birth, ID band Patient awake    Reviewed: Allergy & Precautions, NPO status , Patient's Chart, lab work & pertinent test results  History of Anesthesia Complications (+) PONVNegative for: history of anesthetic complications  Airway Mallampati: II  TM Distance: >3 FB Neck ROM: Full    Dental  (+) Edentulous Upper, Dental Advisory Given   Pulmonary former smoker,    Pulmonary exam normal        Cardiovascular hypertension, Pt. on medications and Pt. on home beta blockers + Peripheral Vascular Disease  Normal cardiovascular exam     Neuro/Psych negative neurological ROS  negative psych ROS   GI/Hepatic GERD  ,  Endo/Other  diabetesHypothyroidism Morbid obesity  Renal/GU Renal InsufficiencyRenal disease     Musculoskeletal   Abdominal   Peds  Hematology   Anesthesia Other Findings   Reproductive/Obstetrics                            Anesthesia Physical Anesthesia Plan  ASA: III  Anesthesia Plan: General   Post-op Pain Management:    Induction: Intravenous  Airway Management Planned: LMA  Additional Equipment:   Intra-op Plan:   Post-operative Plan: Extubation in OR  Informed Consent: I have reviewed the patients History and Physical, chart, labs and discussed the procedure including the risks, benefits and alternatives for the proposed anesthesia with the patient or authorized representative who has indicated his/her understanding and acceptance.   Dental advisory given  Plan Discussed with: Anesthesiologist, Surgeon and CRNA  Anesthesia Plan Comments:        Anesthesia Quick Evaluation

## 2015-06-04 NOTE — Anesthesia Procedure Notes (Signed)
Procedure Name: LMA Insertion Date/Time: 06/04/2015 12:07 PM Performed by: Julian Reil Pre-anesthesia Checklist: Patient identified, Emergency Drugs available, Suction available and Patient being monitored Patient Re-evaluated:Patient Re-evaluated prior to inductionOxygen Delivery Method: Circle system utilized Preoxygenation: Pre-oxygenation with 100% oxygen Intubation Type: IV induction LMA: LMA inserted LMA Size: 4.0 Tube type: Oral Number of attempts: 1 Placement Confirmation: positive ETCO2 and breath sounds checked- equal and bilateral Tube secured with: Tape Dental Injury: Teeth and Oropharynx as per pre-operative assessment

## 2015-06-04 NOTE — Interval H&P Note (Signed)
History and Physical Interval Note:  06/04/2015 12:08 PM  [name]  has presented today for surgery, with the diagnosis of End Stage Renal Disease N18.6  The various methods of treatment have been discussed with the patient and family. After consideration of risks, benefits and other options for treatment, the patient has consented to  Procedure(s): FISTULOGRAM; POSSIBLE INTERVENTION (Left) REVISION OF ARTERIOVENOUS GORETEX GRAFT (Left) as a surgical intervention .  The patient's history has been reviewed, patient examined, no change in status, stable for surgery.  I have reviewed the patient's chart and labs.  Questions were answered to the patient's satisfaction.     Adele Barthel

## 2015-06-04 NOTE — H&P (View-Only) (Signed)
Established Dialysis Access  History of Present Illness  [name] is a 54 y.o. ([birth year]) female who presents for re-evaluation of Left upper arm AVG.  Reportedly this LUA AVG was placed in [place], [place] by Dr. Harrington Challenger.  Those records are not available.  Dr. Trula Slade has venoplastied a prior L SCV stent and placed another stent for a PSA in the past.  The patient denies any steal but does not have bled previously from the arm of thinning over her graft.  She currently denies any recent interventions and denies any current flow issues.  She was seen in the ED on 10/05/14 for a bleeding episode from that LUA AVG  Past Medical History  Diagnosis Date  . Diabetes mellitus, type II, insulin dependent   . Paroxysmal supraventricular tachycardia   . End stage renal disease on dialysis     Hemodialysis T, TH, Sat  . Hypothyroidism (acquired)   . Essential hypertension     Difficult control. On multiple medications.  . Diabetic peripheral neuropathy associated with type 2 diabetes mellitus   . Peripheral arterial disease December 2014    @ [place]: a) RSFA PTA (01/2013); b) L SFA [place] Atherectomy/PTA with 3V runoff; LEA Dopplers January 2016 [facility] post L SFA PTA): Mild, insignificant disease in the left CFA, profunda, SFA, popliteal artery and tibioperoneal trunk.  . 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 Hemodialysis patient   . Hyperlipidemia   . Sickle cell trait   . Gastroparesis due to DM   . GERD (gastroesophageal reflux disease)     Past Surgical History  Procedure Laterality Date  . Abdominal hysterectomy    . Knee surgery      right  . Thyroid surgery    . Thyroidectomy    . Knee arthroplasty    . Cataract extraction, bilateral    . Colonoscopy  07/19/2012    Procedure: COLONOSCOPY;  Surgeon: Beryle Beams, MD;  Location: [place];  Service: Endoscopy;  Laterality: N/A;  . Arteriovenous graft  placement Left 2012    Dr. Harrington Challenger  2nd graft  . Shuntogram Left 02/14/2013    Procedure: FISTULOGRAM;  Surgeon: Serafina Mitchell, MD;  Location: [facility] C[facility] LAB;  Service: Cardiovascular;  Laterality: Left;  . Nm myoview ltd  September 2009; 01/15/2015    a) @ [facility] - Negative for inducible or reversible ischemia with pharmacologic stress. EF 63% ; b) [facility]: EF 68%. No RWMA, negative S4 reversible coronary ischemia or infarction.  . Carotid ultrasound   May 2011    Normal ([place], [place])  . Transthoracic echocardiogram  October 2013; January 2015    a) [name]) 2013: normal LV size and function. Mild LVH. EF 55-60%. no RWMA, No valve disease; b) [facility] 1/'15: EF 70-75%, mild MR. Normal diastolic dysfunction  . Peripheral vascular catheterization  May 2014; December 2014    a) RSFA & PopA mod-severe disease -- R SFA PTA, Med Rx of L SFA; b) distal abdominal and iliac & CFA arteries patent.  m-d LSFA long 60%, LPop 60% -> 3 V runoff. RSFA ~40% mid, RPop 60%, 3V runoff  . Peripheral angioplasty  May 2014    PTA of R SFA-PopA.  Marland Kitchen Peripheral athrectomy  December 2014    [place] Atherectomy of L SFA-L PopA reduction of 60% to 10% stenosis     Social History   Social History  .  Marital Status: Divorced    Spouse Name: N/A  . Number of Children: 3  . Years of Education: N/A   Occupational History  . Not on file.   Social History Main Topics  . Smoking status: Former Smoker    Types: Cigarettes    Quit date: 09/20/1993  . Smokeless tobacco: Never Used  . Alcohol Use: No  . Drug Use: Yes    Special: Marijuana     Comment: abused drugs in the past  . Sexual Activity: No   Other Topics Concern  . Not on file   Social History Narrative   Drinks tea.   Former smoker   Does not drink alcohol    has 9 grandchildren. Lives alone. Is currently disabled. She does walk routinely.        Family History  Problem Relation Age of Onset  .  Diabetes Mother   . Hypertension Mother   . Heart attack Mother   . Diabetes Father   . Hypertension Father     Current Outpatient Prescriptions  Medication Sig Dispense Refill  . aspirin 81 MG chewable tablet Chew 81 mg by mouth daily.    . cinacalcet (SENSIPAR) 60 MG tablet Take 60 mg by mouth daily.    . cloNIDine (CATAPRES) 0.2 MG tablet Take 0.2 mg by mouth 2 (two) times daily.     Marland Kitchen esomeprazole (NEXIUM) 40 MG capsule Take 40 mg by mouth daily before breakfast.    . HUMALOG MIX 75/25 KWIKPEN (75-25) 100 UNIT/ML Kwikpen Use 30 units in the morning, 20 units in the evening if needed if glucose os 180 or above take night time units  0  . levothyroxine (SYNTHROID, LEVOTHROID) 125 MCG tablet Take 125 mcg by mouth daily before breakfast.     . metoprolol succinate (TOPROL-XL) 100 MG 24 hr tablet Take 100 mg by mouth daily. Take with or immediately following a meal.    . NIFEdipine (PROCARDIA XL/ADALAT-CC) 90 MG 24 hr tablet Take 90 mg by mouth daily.    Marland Kitchen oxyCODONE-acetaminophen (PERCOCET) 10-325 MG per tablet Take 1 tablet by mouth every 4 (four) hours as needed for pain.    . pravastatin (PRAVACHOL) 40 MG tablet Take 40 mg by mouth at bedtime.     . pregabalin (LYRICA) 150 MG capsule Take 1 capsule (150 mg total) by mouth 2 (two) times daily. 60 capsule 5  . promethazine (PHENERGAN) 25 MG tablet Take 25 mg by mouth every 8 (eight) hours as needed for nausea.    . sevelamer carbonate (RENVELA) 800 MG tablet Take 80-2,400 mg by mouth 3 (three) times daily with meals. Take 800mg  with snacks and 2400 with meals    . labetalol (NORMODYNE) 300 MG tablet     . LYRICA 75 MG capsule     . mupirocin ointment (BACTROBAN) 2 %     . NOVOFINE 32G X 6 MM MISC      No current facility-administered medications for this visit.     No Known Allergies   REVIEW OF SYSTEMS:  (Positives checked otherwise negative)  CARDIOVASCULAR:   [ ]  chest pain,  [ ]  chest pressure,  [ ]  palpitations,  [ ]   shortness of breath when laying flat,  [ ]  shortness of breath with exertion,   [ ]  pain in feet when walking,  [ ]  pain in feet when laying flat, [ ]  history of blood clot in veins (DVT),  [ ]  history of phlebitis,  [ ]  swelling  in legs,  [ ]  varicose veins  PULMONARY:   [ ]  productive cough,  [ ]  asthma,  [ ]  wheezing  NEUROLOGIC:   [ ]  weakness in arms or legs,  [ ]  numbness in arms or legs,  [ ]  difficulty speaking or slurred speech,  [ ]  temporary loss of vision in one eye,  [ ]  dizziness  HEMATOLOGIC:   [ ]  bleeding problems,  [ ]  problems with blood clotting too easily  MUSCULOSKEL:   [ ]  joint pain, [ ]  joint swelling  GASTROINTEST:   [ ]  vomiting blood,  [ ]  blood in stool     GENITOURINARY:   [ ]  burning with urination,  [ ]  blood in urine [x]  ESRD-HD: T-R-S  PSYCHIATRIC:   [ ]  history of major depression  INTEGUMENTARY:   [ ]  rashes,  [ ]  ulcers  CONSTITUTIONAL:   [ ]  fever,  [ ]  chills     Physical Examination  Filed Vitals:   05/16/15 1041  BP: 162/66  Pulse: 65  Temp: 97.9 F (36.6 C)  TempSrc: Oral  Height: 5\' 4"  (1.626 m)  Weight: 224 lb (101.606 kg)  SpO2: 94%   Body mass index is 38.43 kg/(m^2).  General: A&O x 3, WD, obese  Pulmonary: Sym exp, good air movt, CTAB, no rales, rhonchi, & wheezing  Cardiac: RRR, Nl S1, S2, no Murmurs, rubs or gallops  Vascular: Vessel Right Left  Radial Palpable Palpable  Ulnar Not Palpable Not Palpable  Brachial Palpable Palpable   Gastrointestinal: soft, NTND, no G/R, bo HSM, no masses, no CVAT B  Musculoskeletal: M/S 5/5 throughout , Extremities without  ischemic changes , + palpable thrill in access in L UA, + bruit in access: no obvious stenotic flow, moderate size PSA over distal 1/3 of LUA AVG  Neurologic: Pain and light touch intact in extremities , Motor exam as listed above   Medical Decision Making  [name] is a 54 y.o. female who presents with ESRD requiring  hemodialysis, LUA AVG s/p L SCV stenting & PSA stenting, LUA AVG PSA   Unfortunately, unclear exactly what procedures done on this LUA AVG due to the multiple locations interventions are taking place.  Would obtain a L UA shuntogram, possible intervention.    If the central venous structures are adequately patent, could consider immediate revision of this graft with an interposition segment otherwise another access will need to be consider at another point.  The patient has agreed to proceed with the above procedure which will be scheduled this coming month.   Adele Barthel, MD [facility] Office: [phone] Pager: [phone]  05/16/2015, 12:07 PM

## 2015-06-04 NOTE — Discharge Instructions (Signed)
° ° °  06/04/2015 [name] [id number] March 11, 1961  Surgeon(s): Conrad [place], MD  Procedure(s): FISTULOGRAM REVISION OF ARTERIOVENOUS GORETEX GRAFT  x May stick graft on designated area only:  1. Do NOT stick lateral side of graft between incisions x 4 weeks. 2.  ONLY stick medial side of the graft. 3.  SEE DIAGRAM.

## 2015-06-05 ENCOUNTER — Telehealth: Payer: Self-pay | Admitting: Vascular Surgery

## 2015-06-05 ENCOUNTER — Encounter (HOSPITAL_COMMUNITY): Payer: Self-pay | Admitting: Vascular Surgery

## 2015-06-05 NOTE — Telephone Encounter (Addendum)
-----   Message from Mena Goes, RN sent at 06/04/2015  3:47 PM EDT ----- Regarding: schedule   ----- Message -----    From: Conrad Bryant, MD    Sent: 06/04/2015   3:23 PM      To: [facility] [address]  [name] [id number] October 05, 1960  PROCEDURE: Left upper arm arteriovenous graft cannulation with ultrasound guidance Left arm shuntogram  Revision of left upper arm arteriovenous graft   Asst: Leontine Locket, PAC   Follow-up: 4 weeks   notified patient of post op appt. on 07-11-15 at 9:15 am

## 2015-06-10 ENCOUNTER — Emergency Department (HOSPITAL_COMMUNITY): Payer: Medicare Other

## 2015-06-10 ENCOUNTER — Encounter (HOSPITAL_COMMUNITY): Payer: Self-pay | Admitting: Emergency Medicine

## 2015-06-10 ENCOUNTER — Inpatient Hospital Stay (HOSPITAL_COMMUNITY)
Admission: EM | Admit: 2015-06-10 | Discharge: 2015-06-12 | DRG: 314 | Disposition: A | Discharge status: Home / Self Care | Payer: Medicare Other | Attending: Internal Medicine | Admitting: Internal Medicine

## 2015-06-10 DIAGNOSIS — T82848A Pain from vascular prosthetic devices, implants and grafts, initial encounter: Principal | ICD-10-CM | POA: Diagnosis present

## 2015-06-10 DIAGNOSIS — Y828 Other medical devices associated with adverse incidents: Secondary | ICD-10-CM | POA: Diagnosis present

## 2015-06-10 DIAGNOSIS — Z8249 Family history of ischemic heart disease and other diseases of the circulatory system: Secondary | ICD-10-CM

## 2015-06-10 DIAGNOSIS — E890 Postprocedural hypothyroidism: Secondary | ICD-10-CM | POA: Diagnosis present

## 2015-06-10 DIAGNOSIS — R0902 Hypoxemia: Secondary | ICD-10-CM | POA: Diagnosis not present

## 2015-06-10 DIAGNOSIS — Z7982 Long term (current) use of aspirin: Secondary | ICD-10-CM

## 2015-06-10 DIAGNOSIS — N186 End stage renal disease: Secondary | ICD-10-CM | POA: Diagnosis present

## 2015-06-10 DIAGNOSIS — R0781 Pleurodynia: Secondary | ICD-10-CM | POA: Diagnosis present

## 2015-06-10 DIAGNOSIS — D631 Anemia in chronic kidney disease: Secondary | ICD-10-CM | POA: Diagnosis present

## 2015-06-10 DIAGNOSIS — Z90710 Acquired absence of both cervix and uterus: Secondary | ICD-10-CM

## 2015-06-10 DIAGNOSIS — F1290 Cannabis use, unspecified, uncomplicated: Secondary | ICD-10-CM | POA: Diagnosis present

## 2015-06-10 DIAGNOSIS — M1990 Unspecified osteoarthritis, unspecified site: Secondary | ICD-10-CM | POA: Diagnosis present

## 2015-06-10 DIAGNOSIS — Z6836 Body mass index (BMI) 36.0-36.9, adult: Secondary | ICD-10-CM

## 2015-06-10 DIAGNOSIS — I10 Essential (primary) hypertension: Secondary | ICD-10-CM | POA: Diagnosis present

## 2015-06-10 DIAGNOSIS — Z833 Family history of diabetes mellitus: Secondary | ICD-10-CM

## 2015-06-10 DIAGNOSIS — I120 Hypertensive chronic kidney disease with stage 5 chronic kidney disease or end stage renal disease: Secondary | ICD-10-CM | POA: Diagnosis present

## 2015-06-10 DIAGNOSIS — Z794 Long term (current) use of insulin: Secondary | ICD-10-CM

## 2015-06-10 DIAGNOSIS — E1143 Type 2 diabetes mellitus with diabetic autonomic (poly)neuropathy: Secondary | ICD-10-CM | POA: Diagnosis present

## 2015-06-10 DIAGNOSIS — Z992 Dependence on renal dialysis: Secondary | ICD-10-CM

## 2015-06-10 DIAGNOSIS — K219 Gastro-esophageal reflux disease without esophagitis: Secondary | ICD-10-CM | POA: Diagnosis present

## 2015-06-10 DIAGNOSIS — E1151 Type 2 diabetes mellitus with diabetic peripheral angiopathy without gangrene: Secondary | ICD-10-CM | POA: Diagnosis present

## 2015-06-10 DIAGNOSIS — K3184 Gastroparesis: Secondary | ICD-10-CM | POA: Diagnosis present

## 2015-06-10 DIAGNOSIS — R911 Solitary pulmonary nodule: Secondary | ICD-10-CM | POA: Diagnosis present

## 2015-06-10 DIAGNOSIS — I471 Supraventricular tachycardia: Secondary | ICD-10-CM | POA: Diagnosis present

## 2015-06-10 DIAGNOSIS — R011 Cardiac murmur, unspecified: Secondary | ICD-10-CM | POA: Diagnosis present

## 2015-06-10 DIAGNOSIS — Z87891 Personal history of nicotine dependence: Secondary | ICD-10-CM

## 2015-06-10 DIAGNOSIS — J9621 Acute and chronic respiratory failure with hypoxia: Secondary | ICD-10-CM | POA: Diagnosis present

## 2015-06-10 DIAGNOSIS — D573 Sickle-cell trait: Secondary | ICD-10-CM | POA: Diagnosis present

## 2015-06-10 DIAGNOSIS — E875 Hyperkalemia: Secondary | ICD-10-CM | POA: Diagnosis present

## 2015-06-10 DIAGNOSIS — E1122 Type 2 diabetes mellitus with diabetic chronic kidney disease: Secondary | ICD-10-CM | POA: Diagnosis present

## 2015-06-10 DIAGNOSIS — E785 Hyperlipidemia, unspecified: Secondary | ICD-10-CM | POA: Diagnosis present

## 2015-06-10 DIAGNOSIS — Z79899 Other long term (current) drug therapy: Secondary | ICD-10-CM

## 2015-06-10 DIAGNOSIS — E1140 Type 2 diabetes mellitus with diabetic neuropathy, unspecified: Secondary | ICD-10-CM | POA: Diagnosis present

## 2015-06-10 DIAGNOSIS — Z9862 Peripheral vascular angioplasty status: Secondary | ICD-10-CM

## 2015-06-10 DIAGNOSIS — E119 Type 2 diabetes mellitus without complications: Secondary | ICD-10-CM

## 2015-06-10 DIAGNOSIS — E079 Disorder of thyroid, unspecified: Secondary | ICD-10-CM | POA: Diagnosis present

## 2015-06-10 DIAGNOSIS — J9811 Atelectasis: Secondary | ICD-10-CM | POA: Diagnosis present

## 2015-06-10 LAB — I-STAT TROPONIN, ED: TROPONIN I, POC: 0 ng/mL (ref 0.00–0.08)

## 2015-06-10 LAB — CBC
HCT: 37.4 % (ref 36.0–46.0)
HEMOGLOBIN: 11.8 g/dL — AB (ref 12.0–15.0)
MCH: 24.5 pg — AB (ref 26.0–34.0)
MCHC: 31.6 g/dL (ref 30.0–36.0)
MCV: 77.8 fL — ABNORMAL LOW (ref 78.0–100.0)
Platelets: 88 10*3/uL — ABNORMAL LOW (ref 150–400)
RBC: 4.81 MIL/uL (ref 3.87–5.11)
RDW: 21.5 % — AB (ref 11.5–15.5)
WBC: 5.9 10*3/uL (ref 4.0–10.5)

## 2015-06-10 LAB — BASIC METABOLIC PANEL
ANION GAP: 16 — AB (ref 5–15)
BUN: 35 mg/dL — AB (ref 6–20)
CALCIUM: 7.6 mg/dL — AB (ref 8.9–10.3)
CO2: 23 mmol/L (ref 22–32)
Chloride: 97 mmol/L — ABNORMAL LOW (ref 101–111)
Creatinine, Ser: 9.71 mg/dL — ABNORMAL HIGH (ref 0.44–1.00)
GFR calc Af Amer: 5 mL/min — ABNORMAL LOW (ref >60)
GFR calc non Af Amer: 4 mL/min — ABNORMAL LOW (ref >60)
GLUCOSE: 133 mg/dL — AB (ref 65–99)
Potassium: 5.4 mmol/L — ABNORMAL HIGH (ref 3.5–5.1)
Sodium: 136 mmol/L (ref 135–145)

## 2015-06-10 MED ORDER — VANCOMYCIN HCL 10 G IV SOLR
1750.0000 mg | Freq: Once | INTRAVENOUS | Status: AC
  Administered 2015-06-10: 1750 mg via INTRAVENOUS
  Filled 2015-06-10: qty 1750

## 2015-06-10 MED ORDER — IOHEXOL 350 MG/ML SOLN
100.0000 mL | Freq: Once | INTRAVENOUS | Status: AC | PRN
  Administered 2015-06-10: 60 mL via INTRAVENOUS

## 2015-06-10 MED ORDER — DEXTROSE 5 % IV SOLN: 2.0000 g | INTRAVENOUS | Status: DC

## 2015-06-10 MED ORDER — HYDROMORPHONE HCL 1 MG/ML IJ SOLN
1.0000 mg | Freq: Once | INTRAMUSCULAR | Status: AC
Start: 2015-06-10 — End: 2015-06-10
  Administered 2015-06-10: 1 mg via INTRAVENOUS
  Filled 2015-06-10: qty 1

## 2015-06-10 MED ORDER — DEXTROSE 5 % IV SOLN
2.0000 g | Freq: Once | INTRAVENOUS | Status: AC
  Administered 2015-06-10: 2 g via INTRAVENOUS
  Filled 2015-06-10: qty 2

## 2015-06-10 MED ORDER — VANCOMYCIN HCL IN DEXTROSE 1-5 GM/200ML-% IV SOLN: 1000.0000 mg | INTRAVENOUS | Status: DC

## 2015-06-10 NOTE — Progress Notes (Signed)
ANTIBIOTIC CONSULT NOTE - INITIAL  Pharmacy Consult for Ceftazidime and Vancomycin Indication: HCAP  No Known Allergies  Patient Measurements: TBW 94 kg  Vital Signs: Temp: 98.2 F (36.8 C) (09/20 1750) Temp Source: Oral (09/20 1750) BP: 138/55 mmHg (09/20 1921) Pulse Rate: 75 (09/20 1921) Intake/Output from previous day:   Intake/Output from this shift:    Labs:  Recent Labs  06/10/15 1520  WBC 5.9  HGB 11.8*  PLT 88*  CREATININE 9.71*   Estimated Creatinine Clearance: 7.4 mL/min (by C-G formula based on Cr of 9.71). No results for input(s): VANCOTROUGH, VANCOPEAK, VANCORANDOM, GENTTROUGH, GENTPEAK, GENTRANDOM, TOBRATROUGH, TOBRAPEAK, TOBRARND, AMIKACINPEAK, AMIKACINTROU, AMIKACIN in the last 72 hours.   Microbiology: No results found for this or any previous visit (from the past 720 hour(s)).  Medical History: Past Medical History  Diagnosis Date  . Diabetes mellitus, type II, insulin dependent   . Paroxysmal supraventricular tachycardia   . End stage renal disease on dialysis     Hemodialysis T, TH, Sat  . Hypothyroidism (acquired)   . Essential hypertension     Difficult control. On multiple medications.  . Diabetic peripheral neuropathy associated with type 2 diabetes mellitus   . Peripheral arterial disease December 2014    @ [place]: a) RSFA PTA (01/2013); b) L SFA [place] Atherectomy/PTA with 3V runoff; LEA Dopplers January 2016 [facility] post L SFA PTA): Mild, insignificant disease in the left CFA, profunda, SFA, popliteal artery and tibioperoneal trunk.  . 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 Hemodialysis patient   . Hyperlipidemia   . Sickle cell trait   . Gastroparesis due to DM   . GERD (gastroesophageal reflux disease)   . Heart murmur   . Arthritis     Assessment: 54 yo F presents on 9/20 with SOB and possible PNA. Pharmacy consulted to dose abx for HCAP. Afebrile, WBC wnl.  PMH includes ESRD with HD on TTS.  Goal of Therapy:  Vancomycin trough level 15-20 mcg/ml  Resolution of infection  Plan:  Give ceftazidime 2g IV x 1, then start ceftazidime 2g IV QHD Give vancomycin 1750mg  IV x 1, then start vancomycin 1g IV QHD Monitor clinical picture, HD sessions F/U HD schedule, C&S, abx deescalation / LOT  Loxley Schmale J 06/10/2015,8:31 PM

## 2015-06-10 NOTE — ED Notes (Signed)
Permission granted from Dr. Jimmy Footman, on-call nephrologist to access dialysis port on right side of chest.

## 2015-06-10 NOTE — ED Notes (Signed)
Pt sts SOB after having dialysis cath placed in right chest; pt sts palpitations; pt sts some pain in left arm with swelling recently had procedure on left arm; pt sts painful to take deep breath

## 2015-06-10 NOTE — ED Provider Notes (Signed)
CSN: [id number]     Arrival date & time 06/10/15  1450 History   First MD Initiated Contact with Patient 06/10/15 1922     Chief Complaint  Patient presents with  . Shortness of Breath     (Consider location/radiation/quality/duration/timing/severity/associated sxs/prior Treatment) HPI  54 year old female presents with chest pain and shortness of breath since having a temporary subclavian dialysis catheter placed 5 days ago. The patient has been having issues with her left AV fistula and had revision by Dr. Bridgett Larsson. That same day she had the temporary catheter placed by Dr. Augustin Coupe at his outpatient facility. She states that immediately postop she's been having pain at the site. She has had multiple of these types of catheters they have never hurt more than one day. She is having pain to where Percocet is not controlling it. Any time she breathes she has pain right around the catheter insertion site. Has not no centigrade drainage, swelling, or redness. No fevers. No cough. Has been having shortness of breath however for the last 5 days. Denies any leg swelling. Last went to dialysis yesterday.  Past Medical History  Diagnosis Date  . Diabetes mellitus, type II, insulin dependent   . Paroxysmal supraventricular tachycardia   . End stage renal disease on dialysis     Hemodialysis T, TH, Sat  . Hypothyroidism (acquired)   . Essential hypertension     Difficult control. On multiple medications.  . Diabetic peripheral neuropathy associated with type 2 diabetes mellitus   . Peripheral arterial disease December 2014    @ [place]: a) RSFA PTA (01/2013); b) L SFA [place] Atherectomy/PTA with 3V runoff; LEA Dopplers January 2016 [facility] post L SFA PTA): Mild, insignificant disease in the left CFA, profunda, SFA, popliteal artery and tibioperoneal trunk.  . 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 Hemodialysis patient   . Hyperlipidemia   .  Sickle cell trait   . Gastroparesis due to DM   . GERD (gastroesophageal reflux disease)   . Heart murmur   . Arthritis    Past Surgical History  Procedure Laterality Date  . Abdominal hysterectomy    . Knee surgery      right  . Thyroid surgery    . Thyroidectomy    . Knee arthroplasty    . Cataract extraction, bilateral    . Colonoscopy  07/19/2012    Procedure: COLONOSCOPY;  Surgeon: Beryle Beams, MD;  Location: [place];  Service: Endoscopy;  Laterality: N/A;  . Arteriovenous graft placement Left 2012    Dr. Harrington Challenger  2nd graft  . Shuntogram Left 02/14/2013    Procedure: FISTULOGRAM;  Surgeon: Serafina Mitchell, MD;  Location: [facility] CATH LAB;  Service: Cardiovascular;  Laterality: Left;  . Nm myoview ltd  September 2009; 01/15/2015    a) @ [facility] - Negative for inducible or reversible ischemia with pharmacologic stress. EF 63% ; b) [facility]: EF 68%. No RWMA, negative S4 reversible coronary ischemia or infarction.  . Carotid ultrasound   May 2011    Normal ([place], [place])  . Transthoracic echocardiogram  October 2013; January 2015    a) [name]) 2013: normal LV size and function. Mild LVH. EF 55-60%. no RWMA, No valve disease; b) [facility] 1/'15: EF 70-75%, mild MR. Normal diastolic dysfunction  . Peripheral vascular catheterization  May 2014; December 2014    a) RSFA & PopA mod-severe disease -- R SFA PTA, Med  Rx of L SFA; b) distal abdominal and iliac & CFA arteries patent.  m-d LSFA long 60%, LPop 60% -> 3 V runoff. RSFA ~40% mid, RPop 60%, 3V runoff  . Peripheral angioplasty  May 2014    PTA of R SFA-PopA.  Marland Kitchen Peripheral athrectomy  December 2014    [place] Atherectomy of L SFA-L PopA reduction of 60% to 10% stenosis   . Fistulogram Left 06/04/2015    Procedure: FISTULOGRAM;  Surgeon: Conrad [place], MD;  Location: [facility] OR;  Service: Vascular;  Laterality: Left;  . Revision of arteriovenous goretex graft Left 06/04/2015    Procedure: REVISION  OF ARTERIOVENOUS GORETEX GRAFT;  Surgeon: Conrad [place], MD;  Location: [place];  Service: Vascular;  Laterality: Left;   Family History  Problem Relation Age of Onset  . Diabetes Mother   . Hypertension Mother   . Heart attack Mother   . Diabetes Father   . Hypertension Father    Social History  Substance Use Topics  . Smoking status: Former Smoker    Types: Cigarettes    Quit date: 09/20/1993  . Smokeless tobacco: Never Used  . Alcohol Use: No   OB History    Gravida Para Term Preterm AB TAB SAB Ectopic Multiple Living            3     Review of Systems  Constitutional: Negative for fever.  Respiratory: Positive for shortness of breath. Negative for cough.   Cardiovascular: Positive for chest pain. Negative for leg swelling.      Allergies  Review of patient's allergies indicates no known allergies.  Home Medications   Prior to Admission medications   Medication Sig Start Date End Date Taking? Authorizing Provider  aspirin 81 MG chewable tablet Chew 81 mg by mouth daily.    Historical Provider, MD  cinacalcet (SENSIPAR) 60 MG tablet Take 60 mg by mouth every Monday, Wednesday, and Friday. Take 1 capsule (60 mg) by mouth after dialysis on Monday, Wednesday, Friday    Historical Provider, MD  cloNIDine (CATAPRES) 0.2 MG tablet Take 0.2 mg by mouth 2 (two) times daily.     Historical Provider, MD  esomeprazole (NEXIUM) 40 MG capsule Take 40 mg by mouth daily before breakfast.    Historical Provider, MD  Insulin Lispro Prot & Lispro (HUMALOG MIX 75/25 KWIKPEN) (75-25) 100 UNIT/ML Kwikpen Inject 20-30 Units into the skin 2 (two) times daily with a meal. Inject 30 units subcutaneously with breakfast and 20 units with supper    Historical Provider, MD  levothyroxine (SYNTHROID, LEVOTHROID) 125 MCG tablet Take 125 mcg by mouth daily before breakfast.     Historical Provider, MD  metoprolol succinate (TOPROL-XL) 100 MG 24 hr tablet Take 100 mg by mouth daily with supper. Take with  or immediately following a meal.    Historical Provider, MD  NIFEdipine (PROCARDIA XL/ADALAT-CC) 90 MG 24 hr tablet Take 90 mg by mouth daily.    Historical Provider, MD  NOVOFINE 32G X 6 MM MISC  02/18/15   Historical Provider, MD  oxyCODONE-acetaminophen (PERCOCET) 10-325 MG per tablet Take 1 tablet by mouth every 6 (six) hours as needed for pain. 06/04/15   Samantha J Rhyne, PA-C  pravastatin (PRAVACHOL) 40 MG tablet Take 40 mg by mouth at bedtime.     Historical Provider, MD  pregabalin (LYRICA) 75 MG capsule Take 75 mg by mouth 2 (two) times daily.    Historical Provider, MD  promethazine (PHENERGAN) 25 MG tablet Take  25 mg by mouth every 8 (eight) hours as needed for nausea or vomiting.     Historical Provider, MD  sevelamer carbonate (RENVELA) 800 MG tablet Take 800-2,400 mg by mouth See admin instructions. Take 3 tablets (2400 mg) by mouth with meals, and 1 tablet (800 mg) with snacks    Historical Provider, MD   BP 138/55 mmHg  Pulse 75  Temp(Src) 98.2 F (36.8 C) (Oral)  Resp 18  SpO2 99% Physical Exam  Constitutional: She is oriented to person, place, and time. She appears well-developed and well-nourished. No distress.  HENT:  Head: Normocephalic and atraumatic.  Right Ear: External ear normal.  Left Ear: External ear normal.  Nose: Nose normal.  Eyes: Right eye exhibits no discharge. Left eye exhibits no discharge.  Cardiovascular: Normal rate, regular rhythm and normal heart sounds.   Pulmonary/Chest: Effort normal. She has rales (bibasilar). She exhibits tenderness.    Abdominal: Soft. There is no tenderness.  Neurological: She is alert and oriented to person, place, and time.  Skin: Skin is warm and dry. She is not diaphoretic.  Nursing note and vitals reviewed.   ED Course  Procedures (including critical care time) Labs Review Labs Reviewed  BASIC METABOLIC PANEL - Abnormal; Notable for the following:    Potassium 5.4 (*)    Chloride 97 (*)    Glucose, Bld 133  (*)    BUN 35 (*)    Creatinine, Ser 9.71 (*)    Calcium 7.6 (*)    GFR calc non Af Amer 4 (*)    GFR calc Af Amer 5 (*)    Anion gap 16 (*)    All other components within normal limits  CBC - Abnormal; Notable for the following:    Hemoglobin 11.8 (*)    MCV 77.8 (*)    MCH 24.5 (*)    RDW 21.5 (*)    Platelets 88 (*)    All other components within normal limits  CULTURE, BLOOD (ROUTINE X 2)  CULTURE, BLOOD (ROUTINE X 2)  I-STAT TROPOININ, ED    Imaging Review Dg Chest 2 View  06/10/2015   CLINICAL DATA:  Shortness of breath 4 days ago  EXAM: CHEST  2 VIEW  COMPARISON:  04/01/2015  FINDINGS: Cardiomediastinal silhouette is stable. There is dual lumen right IJ catheter with tip in right atrium. Linear atelectasis or infiltrate noted bilateral lower lobe. No pulmonary edema.  IMPRESSION: Bilateral lower lobe linear atelectasis or infiltrate. No pulmonary edema. Dual lumen right IJ catheter with tip in right atrium.   Electronically Signed   By: Lahoma Crocker M.D.   On: 06/10/2015 16:07   Ct Angio Chest Pe W/cm &/or Wo Cm  06/10/2015   CLINICAL DATA:  54 year old female with shortness of breath. Recent procedure on the left arm with pain in the left arm.  EXAM: CT ANGIOGRAPHY CHEST WITH CONTRAST  TECHNIQUE: Multidetector CT imaging of the chest was performed using the standard protocol during bolus administration of intravenous contrast. Multiplanar CT image reconstructions and MIPs were obtained to evaluate the vascular anatomy.  CONTRAST:  68mL OMNIPAQUE IOHEXOL 350 MG/ML SOLN  COMPARISON:  Radiograph dated 06/09/2015  FINDINGS: There are bibasilar linear atelectasis/ scarring. There is mild diffuse ground-glass opacity throughout the lungs, likely atelectatic changes. Pneumonia is less likely. There is an 8 mm subpleural nodule in the lingula (series 4, image 50). Follow-up as per Fleischner Society criteria recommended. There is no focal consolidation, pleural effusion, or pneumothorax. The  central  airways appear patent.  The thoracic aorta appears unremarkable. There is mild dilatation of the main pulmonary trunk indicative of a degree of pulmonary hypertension.  Evaluation of the pulmonary arteries is limited due to respiratory motion artifact as well as suboptimal opacification of the peripheral branches. No definite pulmonary emboli identified. There is no hilar or mediastinal adenopathy. Cardiomegaly. There is coronary vascular calcification. No pericardial effusion. The esophagus is predominantly collapsed. The thyroid gland is not visualized possibly surgically absent. Clinical correlation is recommended.  There is no axillary adenopathy. The chest wall soft tissues appear unremarkable. The osseous structures are intact. There is mild compression of the superior endplate of the [id number] vertebra, likely chronic. No definite acute fracture identified. Clinical correlation is recommended.  There are moderate renal atrophy bilaterally. The visualized upper abdomen otherwise appear unremarkable.  Review of the MIP images confirms the above findings.  IMPRESSION: No CT evidence of pulmonary embolism.  An 8 mm lingular subpleural nodule If the patient is at high risk for bronchogenic carcinoma, follow-up chest CT at 3-71months is recommended. If the patient is at low risk for bronchogenic carcinoma, follow-up chest CT at 6-12 months is recommended. This recommendation follows the consensus statement: Guidelines for Management of Small Pulmonary Nodules Detected on CT Scans: A Statement from the [place] as published in Radiology 2005; 237:395-400.   Electronically Signed   By: Anner Crete M.D.   On: 06/10/2015 23:42   I have personally reviewed and evaluated these images and lab results as part of my medical decision-making.   EKG Interpretation   Date/Time:  Tuesday June 10 2015 15:11:20 EDT Ventricular Rate:  72 PR Interval:  164 QRS Duration: 76 QT Interval:  414 QTC  Calculation: 453 R Axis:   59 Text Interpretation:  Normal sinus rhythm Cannot rule out Anterior infarct  , age undetermined Abnormal ECG no significant change since July 2016  Confirmed by Regenia Skeeter  MD, SCOTT [phone]) on 06/10/2015 7:21:44 PM      MDM   Final diagnoses:  Hypoxia    8:19 PM HDS. D/w Dr. Hal Hope about admission for possible PNA and excessive pain at dialysis cath site. Recommends CTA to r/o PE given she has been on dialysis over 4 years. Recommends consult again after CT results. D/w patient, who urinates very infrequently but is on dialysis until possible renal transplant. She understands this may hurt her already very limited renal function including ability to urinate but is OK with CT.  Patient's CT shows no PE but opacities that are atelectasis vs PNA.  Dropped sats to 84% with good waveform, placed on O2. Will continue antibiotics and admit to hospitalist.    Sherwood Gambler, MD 06/11/15 [phone]

## 2015-06-11 ENCOUNTER — Encounter (HOSPITAL_COMMUNITY): Payer: Self-pay | Admitting: Internal Medicine

## 2015-06-11 DIAGNOSIS — E1140 Type 2 diabetes mellitus with diabetic neuropathy, unspecified: Secondary | ICD-10-CM | POA: Diagnosis present

## 2015-06-11 DIAGNOSIS — D573 Sickle-cell trait: Secondary | ICD-10-CM | POA: Diagnosis present

## 2015-06-11 DIAGNOSIS — I120 Hypertensive chronic kidney disease with stage 5 chronic kidney disease or end stage renal disease: Secondary | ICD-10-CM | POA: Diagnosis present

## 2015-06-11 DIAGNOSIS — J9621 Acute and chronic respiratory failure with hypoxia: Secondary | ICD-10-CM | POA: Diagnosis present

## 2015-06-11 DIAGNOSIS — R0902 Hypoxemia: Secondary | ICD-10-CM | POA: Diagnosis present

## 2015-06-11 DIAGNOSIS — E785 Hyperlipidemia, unspecified: Secondary | ICD-10-CM | POA: Diagnosis present

## 2015-06-11 DIAGNOSIS — Z79899 Other long term (current) drug therapy: Secondary | ICD-10-CM | POA: Diagnosis not present

## 2015-06-11 DIAGNOSIS — E1143 Type 2 diabetes mellitus with diabetic autonomic (poly)neuropathy: Secondary | ICD-10-CM | POA: Diagnosis present

## 2015-06-11 DIAGNOSIS — E875 Hyperkalemia: Secondary | ICD-10-CM | POA: Diagnosis present

## 2015-06-11 DIAGNOSIS — Z833 Family history of diabetes mellitus: Secondary | ICD-10-CM | POA: Diagnosis not present

## 2015-06-11 DIAGNOSIS — T82848A Pain from vascular prosthetic devices, implants and grafts, initial encounter: Secondary | ICD-10-CM | POA: Diagnosis present

## 2015-06-11 DIAGNOSIS — Z7982 Long term (current) use of aspirin: Secondary | ICD-10-CM | POA: Diagnosis not present

## 2015-06-11 DIAGNOSIS — M1990 Unspecified osteoarthritis, unspecified site: Secondary | ICD-10-CM | POA: Diagnosis present

## 2015-06-11 DIAGNOSIS — N186 End stage renal disease: Secondary | ICD-10-CM | POA: Diagnosis present

## 2015-06-11 DIAGNOSIS — R0781 Pleurodynia: Secondary | ICD-10-CM | POA: Diagnosis present

## 2015-06-11 DIAGNOSIS — Z8249 Family history of ischemic heart disease and other diseases of the circulatory system: Secondary | ICD-10-CM | POA: Diagnosis not present

## 2015-06-11 DIAGNOSIS — E1151 Type 2 diabetes mellitus with diabetic peripheral angiopathy without gangrene: Secondary | ICD-10-CM | POA: Diagnosis present

## 2015-06-11 DIAGNOSIS — R911 Solitary pulmonary nodule: Secondary | ICD-10-CM | POA: Diagnosis present

## 2015-06-11 DIAGNOSIS — D631 Anemia in chronic kidney disease: Secondary | ICD-10-CM | POA: Diagnosis present

## 2015-06-11 DIAGNOSIS — J9811 Atelectasis: Secondary | ICD-10-CM | POA: Diagnosis present

## 2015-06-11 DIAGNOSIS — I471 Supraventricular tachycardia: Secondary | ICD-10-CM | POA: Diagnosis present

## 2015-06-11 DIAGNOSIS — Z90710 Acquired absence of both cervix and uterus: Secondary | ICD-10-CM | POA: Diagnosis not present

## 2015-06-11 DIAGNOSIS — Z9862 Peripheral vascular angioplasty status: Secondary | ICD-10-CM | POA: Diagnosis not present

## 2015-06-11 DIAGNOSIS — E1122 Type 2 diabetes mellitus with diabetic chronic kidney disease: Secondary | ICD-10-CM | POA: Diagnosis present

## 2015-06-11 DIAGNOSIS — Z87891 Personal history of nicotine dependence: Secondary | ICD-10-CM | POA: Diagnosis not present

## 2015-06-11 DIAGNOSIS — K3184 Gastroparesis: Secondary | ICD-10-CM | POA: Diagnosis present

## 2015-06-11 DIAGNOSIS — R011 Cardiac murmur, unspecified: Secondary | ICD-10-CM | POA: Diagnosis present

## 2015-06-11 DIAGNOSIS — Y828 Other medical devices associated with adverse incidents: Secondary | ICD-10-CM | POA: Diagnosis present

## 2015-06-11 DIAGNOSIS — Z6836 Body mass index (BMI) 36.0-36.9, adult: Secondary | ICD-10-CM | POA: Diagnosis not present

## 2015-06-11 DIAGNOSIS — K219 Gastro-esophageal reflux disease without esophagitis: Secondary | ICD-10-CM | POA: Diagnosis present

## 2015-06-11 DIAGNOSIS — Z794 Long term (current) use of insulin: Secondary | ICD-10-CM | POA: Diagnosis not present

## 2015-06-11 DIAGNOSIS — F1290 Cannabis use, unspecified, uncomplicated: Secondary | ICD-10-CM | POA: Diagnosis present

## 2015-06-11 DIAGNOSIS — Z992 Dependence on renal dialysis: Secondary | ICD-10-CM | POA: Diagnosis not present

## 2015-06-11 DIAGNOSIS — E890 Postprocedural hypothyroidism: Secondary | ICD-10-CM | POA: Diagnosis present

## 2015-06-11 LAB — GLUCOSE, CAPILLARY
GLUCOSE-CAPILLARY: 137 mg/dL — AB (ref 65–99)
GLUCOSE-CAPILLARY: 102 mg/dL — AB (ref 65–99)
Glucose-Capillary: 121 mg/dL — ABNORMAL HIGH (ref 65–99)
Glucose-Capillary: 171 mg/dL — ABNORMAL HIGH (ref 65–99)
Glucose-Capillary: 85 mg/dL (ref 65–99)

## 2015-06-11 LAB — POCT I-STAT, CHEM 8
BUN: 28 mg/dL — ABNORMAL HIGH (ref 6–20)
CALCIUM ION: 1.09 mmol/L — AB (ref 1.12–1.23)
Chloride: 96 mmol/L — ABNORMAL LOW (ref 101–111)
Creatinine, Ser: 5.1 mg/dL — ABNORMAL HIGH (ref 0.44–1.00)
GLUCOSE: 113 mg/dL — AB (ref 65–99)
HCT: 44 % (ref 36.0–46.0)
HEMOGLOBIN: 15 g/dL (ref 12.0–15.0)
Potassium: 4.2 mmol/L (ref 3.5–5.1)
Sodium: 136 mmol/L (ref 135–145)
TCO2: 26 mmol/L (ref 0–100)

## 2015-06-11 LAB — I-STAT TROPONIN, ED: TROPONIN I, POC: 0.01 ng/mL (ref 0.00–0.08)

## 2015-06-11 LAB — COMPREHENSIVE METABOLIC PANEL
ALK PHOS: 103 U/L (ref 38–126)
ALT: 10 U/L — ABNORMAL LOW (ref 14–54)
ANION GAP: 11 (ref 5–15)
AST: 15 U/L (ref 15–41)
Albumin: 3 g/dL — ABNORMAL LOW (ref 3.5–5.0)
BUN: 42 mg/dL — ABNORMAL HIGH (ref 6–20)
CALCIUM: 7.2 mg/dL — AB (ref 8.9–10.3)
CO2: 26 mmol/L (ref 22–32)
CREATININE: 10.67 mg/dL — AB (ref 0.44–1.00)
Chloride: 98 mmol/L — ABNORMAL LOW (ref 101–111)
GFR, EST AFRICAN AMERICAN: 4 mL/min — AB (ref >60)
GFR, EST NON AFRICAN AMERICAN: 4 mL/min — AB (ref >60)
Glucose, Bld: 156 mg/dL — ABNORMAL HIGH (ref 65–99)
Potassium: 6.1 mmol/L (ref 3.5–5.1)
SODIUM: 135 mmol/L (ref 135–145)
Total Bilirubin: 0.3 mg/dL (ref 0.3–1.2)
Total Protein: 6.7 g/dL (ref 6.5–8.1)

## 2015-06-11 LAB — CBC
HCT: 32.3 % — ABNORMAL LOW (ref 36.0–46.0)
HEMOGLOBIN: 10.1 g/dL — AB (ref 12.0–15.0)
MCH: 23.7 pg — AB (ref 26.0–34.0)
MCHC: 31.3 g/dL (ref 30.0–36.0)
MCV: 75.8 fL — AB (ref 78.0–100.0)
PLATELETS: 89 10*3/uL — AB (ref 150–400)
RBC: 4.26 MIL/uL (ref 3.87–5.11)
RDW: 20.7 % — ABNORMAL HIGH (ref 11.5–15.5)
WBC: 5.3 10*3/uL (ref 4.0–10.5)

## 2015-06-11 LAB — MRSA PCR SCREENING: MRSA BY PCR: NEGATIVE

## 2015-06-11 LAB — TROPONIN I

## 2015-06-11 MED ORDER — SODIUM CHLORIDE 0.9 % IV SOLN: 250.0000 mL | INTRAVENOUS | Status: DC | PRN

## 2015-06-11 MED ORDER — SEVELAMER CARBONATE 800 MG PO TABS
2400.0000 mg | ORAL_TABLET | Freq: Three times a day (TID) | ORAL | Status: DC
  Administered 2015-06-11 – 2015-06-12 (×4): 2400 mg via ORAL
  Filled 2015-06-11 (×3): qty 3

## 2015-06-11 MED ORDER — PRAVASTATIN SODIUM 40 MG PO TABS
40.0000 mg | ORAL_TABLET | Freq: Every day | ORAL | Status: DC
  Administered 2015-06-11 (×2): 40 mg via ORAL
  Filled 2015-06-11 (×2): qty 1

## 2015-06-11 MED ORDER — INSULIN LISPRO PROT & LISPRO (75-25 MIX) 100 UNIT/ML KWIKPEN: 20.0000 [IU] | PEN_INJECTOR | Freq: Two times a day (BID) | SUBCUTANEOUS | Status: DC

## 2015-06-11 MED ORDER — PANTOPRAZOLE SODIUM 40 MG PO TBEC
40.0000 mg | DELAYED_RELEASE_TABLET | Freq: Every day | ORAL | Status: DC
  Administered 2015-06-11 – 2015-06-12 (×2): 40 mg via ORAL
  Filled 2015-06-11 (×2): qty 1

## 2015-06-11 MED ORDER — OXYCODONE HCL 5 MG PO TABS
5.0000 mg | ORAL_TABLET | Freq: Four times a day (QID) | ORAL | Status: DC | PRN
  Administered 2015-06-11: 5 mg via ORAL
  Filled 2015-06-11: qty 1

## 2015-06-11 MED ORDER — LEVOTHYROXINE SODIUM 125 MCG PO TABS
125.0000 ug | ORAL_TABLET | Freq: Every day | ORAL | Status: DC
  Administered 2015-06-11 – 2015-06-12 (×2): 125 ug via ORAL
  Filled 2015-06-11 (×2): qty 1

## 2015-06-11 MED ORDER — INSULIN ASPART PROT & ASPART (70-30 MIX) 100 UNIT/ML ~~LOC~~ SUSP
20.0000 [IU] | Freq: Every day | SUBCUTANEOUS | Status: DC
  Administered 2015-06-11: 20 [IU] via SUBCUTANEOUS

## 2015-06-11 MED ORDER — CLONIDINE HCL 0.2 MG PO TABS
0.2000 mg | ORAL_TABLET | Freq: Two times a day (BID) | ORAL | Status: DC
  Administered 2015-06-11 – 2015-06-12 (×4): 0.2 mg via ORAL
  Filled 2015-06-11 (×4): qty 1

## 2015-06-11 MED ORDER — INSULIN ASPART 100 UNIT/ML ~~LOC~~ SOLN
0.0000 [IU] | Freq: Three times a day (TID) | SUBCUTANEOUS | Status: DC
  Administered 2015-06-11: 1 [IU] via SUBCUTANEOUS
  Administered 2015-06-11: 2 [IU] via SUBCUTANEOUS

## 2015-06-11 MED ORDER — SODIUM CHLORIDE 0.9 % IV SOLN
62.5000 mg | Freq: Once | INTRAVENOUS | Status: AC
  Administered 2015-06-11: 62.5 mg via INTRAVENOUS
  Filled 2015-06-11 (×2): qty 5

## 2015-06-11 MED ORDER — CINACALCET HCL 30 MG PO TABS
60.0000 mg | ORAL_TABLET | ORAL | Status: DC
  Administered 2015-06-11: 60 mg via ORAL
  Filled 2015-06-11: qty 2

## 2015-06-11 MED ORDER — DEXTROSE 5 % IV SOLN
2.0000 g | INTRAVENOUS | Status: DC
  Administered 2015-06-11 (×2): 2 g via INTRAVENOUS
  Filled 2015-06-11 (×2): qty 2

## 2015-06-11 MED ORDER — SODIUM CHLORIDE 0.9 % IJ SOLN: 3.0000 mL | INTRAMUSCULAR | Status: DC | PRN

## 2015-06-11 MED ORDER — SEVELAMER CARBONATE 800 MG PO TABS: 800.0000 mg | ORAL_TABLET | ORAL | Status: DC

## 2015-06-11 MED ORDER — METOPROLOL SUCCINATE ER 100 MG PO TB24
100.0000 mg | ORAL_TABLET | Freq: Every day | ORAL | Status: DC
  Administered 2015-06-11 (×2): 100 mg via ORAL
  Filled 2015-06-11 (×2): qty 1

## 2015-06-11 MED ORDER — SODIUM CHLORIDE 0.9 % IJ SOLN
3.0000 mL | Freq: Two times a day (BID) | INTRAMUSCULAR | Status: DC
  Administered 2015-06-11 (×2): 3 mL via INTRAVENOUS

## 2015-06-11 MED ORDER — SODIUM CHLORIDE 0.9 % IJ SOLN
3.0000 mL | Freq: Two times a day (BID) | INTRAMUSCULAR | Status: DC
  Administered 2015-06-11 – 2015-06-12 (×2): 3 mL via INTRAVENOUS

## 2015-06-11 MED ORDER — VANCOMYCIN HCL IN DEXTROSE 1-5 GM/200ML-% IV SOLN
1000.0000 mg | INTRAVENOUS | Status: DC
  Administered 2015-06-11: 1000 mg via INTRAVENOUS
  Filled 2015-06-11 (×2): qty 200

## 2015-06-11 MED ORDER — ONDANSETRON HCL 4 MG PO TABS: 4.0000 mg | ORAL_TABLET | Freq: Four times a day (QID) | ORAL | Status: DC | PRN

## 2015-06-11 MED ORDER — INSULIN ASPART PROT & ASPART (70-30 MIX) 100 UNIT/ML ~~LOC~~ SUSP
30.0000 [IU] | Freq: Every day | SUBCUTANEOUS | Status: DC
  Administered 2015-06-11 – 2015-06-12 (×2): 30 [IU] via SUBCUTANEOUS
  Filled 2015-06-11: qty 10

## 2015-06-11 MED ORDER — PROMETHAZINE HCL 25 MG PO TABS
25.0000 mg | ORAL_TABLET | Freq: Three times a day (TID) | ORAL | Status: DC | PRN
  Administered 2015-06-11: 25 mg via ORAL
  Filled 2015-06-11: qty 1

## 2015-06-11 MED ORDER — ONDANSETRON HCL 4 MG/2ML IJ SOLN: 4.0000 mg | Freq: Four times a day (QID) | INTRAMUSCULAR | Status: DC | PRN

## 2015-06-11 MED ORDER — OXYCODONE-ACETAMINOPHEN 5-325 MG PO TABS
1.0000 | ORAL_TABLET | Freq: Four times a day (QID) | ORAL | Status: DC | PRN
  Administered 2015-06-11 – 2015-06-12 (×3): 1 via ORAL
  Filled 2015-06-11 (×3): qty 1

## 2015-06-11 MED ORDER — HEPARIN SODIUM (PORCINE) 5000 UNIT/ML IJ SOLN
5000.0000 [IU] | Freq: Three times a day (TID) | INTRAMUSCULAR | Status: DC
  Administered 2015-06-11 – 2015-06-12 (×3): 5000 [IU] via SUBCUTANEOUS
  Filled 2015-06-11 (×2): qty 1

## 2015-06-11 MED ORDER — NIFEDIPINE ER OSMOTIC RELEASE 90 MG PO TB24
90.0000 mg | ORAL_TABLET | Freq: Every day | ORAL | Status: DC
  Administered 2015-06-11 – 2015-06-12 (×2): 90 mg via ORAL
  Filled 2015-06-11 (×2): qty 1

## 2015-06-11 MED ORDER — HYDROMORPHONE HCL 1 MG/ML IJ SOLN
1.0000 mg | Freq: Once | INTRAMUSCULAR | Status: AC
  Administered 2015-06-11: 1 mg via INTRAVENOUS
  Filled 2015-06-11: qty 1

## 2015-06-11 MED ORDER — OXYCODONE-ACETAMINOPHEN 10-325 MG PO TABS: 1.0000 | ORAL_TABLET | Freq: Four times a day (QID) | ORAL | Status: DC | PRN

## 2015-06-11 MED ORDER — ASPIRIN 81 MG PO CHEW
81.0000 mg | CHEWABLE_TABLET | Freq: Every day | ORAL | Status: DC
  Administered 2015-06-11 – 2015-06-12 (×2): 81 mg via ORAL
  Filled 2015-06-11 (×2): qty 1

## 2015-06-11 MED ORDER — PREGABALIN 75 MG PO CAPS
75.0000 mg | ORAL_CAPSULE | Freq: Two times a day (BID) | ORAL | Status: DC
  Administered 2015-06-11 – 2015-06-12 (×4): 75 mg via ORAL
  Filled 2015-06-11 (×2): qty 3
  Filled 2015-06-11 (×2): qty 1

## 2015-06-11 MED ORDER — ONDANSETRON HCL 4 MG/2ML IJ SOLN
4.0000 mg | Freq: Once | INTRAMUSCULAR | Status: AC
  Administered 2015-06-11: 4 mg via INTRAVENOUS
  Filled 2015-06-11: qty 2

## 2015-06-11 MED ORDER — SEVELAMER CARBONATE 800 MG PO TABS: 800.0000 mg | ORAL_TABLET | ORAL | Status: DC | PRN

## 2015-06-11 NOTE — Progress Notes (Signed)
Results for [name], [name] (MRN [id number]) as of 06/11/2015 06:57  Ref. Range 06/11/2015 05:30  Potassium Latest Ref Range: 3.5-5.1 mmol/L 6.1 (HH)  Paged MD.

## 2015-06-11 NOTE — Consult Note (Signed)
[facility] Renal Consultation Note    Indication for Consultation:  Management of ESRD/hemodialysis; anemia, hypertension/volume and secondary hyperparathyroidism PCP:  HPI: [name] is a 54 y.o. female with ESRD on hemodialysis MWF at [facility]. PMH significant for DMT2, HTN, Hypothyroidism, GERD, PSVT, hyperlipidemia, sickle cell trait, anemia of chronic disease, DM neuropathy, Gastroparesis due to DM morbid obesity.   Patient states that she has had pain/SOB in R chest since she had new hemodialysis catheter placed per Dr. Augustin Coupe at [facility] last Thursday. Patient states that she has had catheters before but never one that hurt. States that pain is 8/10. Worse when she coughs and takes a deep breath. States that it hurts when she has hemodialysis. Says the pain is relieved with pain medication. Patient has LUA AVG that was recently revised per Dr. Bridgett Larsson 06/04/15 and not able to be accessed yet due to swelling. Patient was admitted to [facility] for evaluation of chest pain/SOB. However today, patient has K+ 6.1 without evidence of hemolysis. Orders have been written for HD and will use catheter for HD today in order to lower K+ level.   Patient has hemodialysis. Usually stays on treatment although has missed treatment and early sign offs during last two weeks. Last in center values are as follows: Phos 3.6, Ca 8.8 C Ca 8.9 PTH 54 (05/14/15) . On Renvela and Sensipar. Hgb 12.0, receives weekly venofer.   Past Medical History  Diagnosis Date  . Diabetes mellitus, type II, insulin dependent   . Paroxysmal supraventricular tachycardia   . End stage renal disease on dialysis     Hemodialysis T, TH, Sat  . Hypothyroidism (acquired)   . Essential hypertension     Difficult control. On multiple medications.  . Diabetic peripheral neuropathy associated with type 2 diabetes mellitus   . Peripheral arterial disease December 2014    @ [place]: a)  RSFA PTA (01/2013); b) L SFA [place] Atherectomy/PTA with 3V runoff; LEA Dopplers January 2016 [facility] post L SFA PTA): Mild, insignificant disease in the left CFA, profunda, SFA, popliteal artery and tibioperoneal trunk.  . 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 Hemodialysis patient   . Hyperlipidemia   . Sickle cell trait   . Gastroparesis due to DM   . GERD (gastroesophageal reflux disease)   . Heart murmur   . Arthritis    Past Surgical History  Procedure Laterality Date  . Abdominal hysterectomy    . Knee surgery      right  . Thyroid surgery    . Thyroidectomy    . Knee arthroplasty    . Cataract extraction, bilateral    . Colonoscopy  07/19/2012    Procedure: COLONOSCOPY;  Surgeon: Beryle Beams, MD;  Location: [place];  Service: Endoscopy;  Laterality: N/A;  . Arteriovenous graft placement Left 2012    Dr. Harrington Challenger  2nd graft  . Shuntogram Left 02/14/2013    Procedure: FISTULOGRAM;  Surgeon: Serafina Mitchell, MD;  Location: [facility] CATH LAB;  Service: Cardiovascular;  Laterality: Left;  . Nm myoview ltd  September 2009; 01/15/2015    a) @ [facility] - Negative for inducible or reversible ischemia with pharmacologic stress. EF 63% ; b) [facility]: EF 68%. No RWMA, negative S4 reversible coronary ischemia or infarction.  . Carotid ultrasound   May 2011    Normal ([place], [place])  . Transthoracic echocardiogram  October 2013; January 2015  a) [name]) 2013: normal LV size and function. Mild LVH. EF 55-60%. no RWMA, No valve disease; b) [facility] 1/'15: EF 70-75%, mild MR. Normal diastolic dysfunction  . Peripheral vascular catheterization  May 2014; December 2014    a) RSFA & PopA mod-severe disease -- R SFA PTA, Med Rx of L SFA; b) distal abdominal and iliac & CFA arteries patent.  m-d LSFA long 60%, LPop 60% -> 3 V runoff. RSFA ~40% mid, RPop 60%, 3V runoff  . Peripheral angioplasty  May 2014    PTA of R  SFA-PopA.  Marland Kitchen Peripheral athrectomy  December 2014    [place] Atherectomy of L SFA-L PopA reduction of 60% to 10% stenosis   . Fistulogram Left 06/04/2015    Procedure: FISTULOGRAM;  Surgeon: Conrad Gearhart, MD;  Location: [facility] OR;  Service: Vascular;  Laterality: Left;  . Revision of arteriovenous goretex graft Left 06/04/2015    Procedure: REVISION OF ARTERIOVENOUS GORETEX GRAFT;  Surgeon: Conrad Kendall West, MD;  Location: [place];  Service: Vascular;  Laterality: Left;   Family History  Problem Relation Age of Onset  . Diabetes Mother   . Hypertension Mother   . Heart attack Mother   . Diabetes Father   . Hypertension Father    Social History:  reports that she quit smoking about 21 years ago. Her smoking use included Cigarettes. She has never used smokeless tobacco. She reports that she uses illicit drugs (Marijuana). She reports that she does not drink alcohol. No Known Allergies Prior to Admission medications   Medication Sig Start Date End Date Taking? Authorizing Provider  aspirin 81 MG chewable tablet Chew 81 mg by mouth daily.   Yes Historical Provider, MD  cinacalcet (SENSIPAR) 60 MG tablet Take 60 mg by mouth every Monday, Wednesday, and Friday. Take 1 capsule (60 mg) by mouth after dialysis on Monday, Wednesday, Friday   Yes Historical Provider, MD  cloNIDine (CATAPRES) 0.2 MG tablet Take 0.2 mg by mouth 2 (two) times daily.    Yes Historical Provider, MD  esomeprazole (NEXIUM) 40 MG capsule Take 40 mg by mouth daily before breakfast.   Yes Historical Provider, MD  Insulin Lispro Prot & Lispro (HUMALOG MIX 75/25 KWIKPEN) (75-25) 100 UNIT/ML Kwikpen Inject 20-30 Units into the skin 2 (two) times daily with a meal. Inject 30 units subcutaneously with breakfast and 20 units with supper   Yes Historical Provider, MD  levothyroxine (SYNTHROID, LEVOTHROID) 125 MCG tablet Take 125 mcg by mouth daily before breakfast.    Yes Historical Provider, MD  metoprolol succinate (TOPROL-XL) 100 MG 24 hr  tablet Take 100 mg by mouth daily with supper. Take with or immediately following a meal.   Yes Historical Provider, MD  NIFEdipine (PROCARDIA XL/ADALAT-CC) 90 MG 24 hr tablet Take 90 mg by mouth daily.   Yes Historical Provider, MD  oxyCODONE-acetaminophen (PERCOCET) 10-325 MG per tablet Take 1 tablet by mouth every 6 (six) hours as needed for pain. 06/04/15  Yes Samantha J Rhyne, PA-C  pravastatin (PRAVACHOL) 40 MG tablet Take 40 mg by mouth at bedtime.    Yes Historical Provider, MD  pregabalin (LYRICA) 75 MG capsule Take 75 mg by mouth 2 (two) times daily.   Yes Historical Provider, MD  promethazine (PHENERGAN) 25 MG tablet Take 25 mg by mouth every 8 (eight) hours as needed for nausea or vomiting.    Yes Historical Provider, MD  sevelamer carbonate (RENVELA) 800 MG tablet Take 800-2,400 mg by mouth See  admin instructions. Take 3 tablets (2400 mg) by mouth with meals, and 1 tablet (800 mg) with snacks   Yes Historical Provider, MD  terbinafine (LAMISIL) 250 MG tablet Take 250 mg by mouth daily at 6 PM. 05/30/15  Yes Historical Provider, MD   Current Facility-Administered Medications  Medication Dose Route Frequency Provider Last Rate Last Dose  . 0.9 %  sodium chloride infusion  250 mL Intravenous PRN Reubin Milan, MD      . aspirin chewable tablet 81 mg  81 mg Oral Daily Reubin Milan, MD   81 mg at 06/11/15 [id number]  . [START ON 06/12/2015] cefTAZidime (FORTAZ) 2 g in dextrose 5 % 50 mL IVPB  2 g Intravenous Q T,Th,Sa-HD Reginia Naas, RPH      . cinacalcet (SENSIPAR) tablet 60 mg  60 mg Oral Once per day on Mon Wed Fri Reubin Milan, MD   60 mg at 06/11/15 0831  . cloNIDine (CATAPRES) tablet 0.2 mg  0.2 mg Oral BID Reubin Milan, MD   0.2 mg at 06/11/15 0831  . insulin aspart (novoLOG) injection 0-9 Units  0-9 Units Subcutaneous TID WC Reubin Milan, MD   1 Units at 06/11/15 [phone]  . insulin aspart protamine- aspart (NOVOLOG MIX 70/30) injection 20 Units  20 Units  Subcutaneous Q supper Reubin Milan, MD      . insulin aspart protamine- aspart (NOVOLOG MIX 70/30) injection 30 Units  30 Units Subcutaneous Q breakfast Reubin Milan, MD   30 Units at 06/11/15 [phone]  . levothyroxine (SYNTHROID, LEVOTHROID) tablet 125 mcg  125 mcg Oral QAC breakfast Reubin Milan, MD   125 mcg at 06/11/15 [phone]  . metoprolol succinate (TOPROL-XL) 24 hr tablet 100 mg  100 mg Oral Q supper Reubin Milan, MD   100 mg at 06/11/15 0247  . NIFEdipine (PROCARDIA XL/ADALAT-CC) 24 hr tablet 90 mg  90 mg Oral Daily Reubin Milan, MD      . ondansetron [facility]) tablet 4 mg  4 mg Oral Q6H PRN Reubin Milan, MD       Or  . ondansetron [facility]) injection 4 mg  4 mg Intravenous Q6H PRN Reubin Milan, MD      . oxyCODONE-acetaminophen (PERCOCET/ROXICET) 5-325 MG per tablet 1 tablet  1 tablet Oral Q6H PRN Reubin Milan, MD   1 tablet at 06/11/15 [id number]   And  . oxyCODONE (Oxy IR/ROXICODONE) immediate release tablet 5 mg  5 mg Oral Q6H PRN Reubin Milan, MD   5 mg at 06/11/15 0843  . pantoprazole (PROTONIX) EC tablet 40 mg  40 mg Oral Daily Reubin Milan, MD   40 mg at 06/11/15 [id number]  . pravastatin (PRAVACHOL) tablet 40 mg  40 mg Oral QHS Reubin Milan, MD   40 mg at 06/11/15 0247  . pregabalin (LYRICA) capsule 75 mg  75 mg Oral BID Reubin Milan, MD   75 mg at 06/11/15 [id number]  . promethazine (PHENERGAN) tablet 25 mg  25 mg Oral Q8H PRN Reubin Milan, MD      . sevelamer carbonate (RENVELA) tablet 2,400 mg  2,400 mg Oral TID WC Reubin Milan, MD   2,400 mg at 06/11/15 [id number]  . sevelamer carbonate (RENVELA) tablet 800 mg  800 mg Oral PRN Reubin Milan, MD      . sodium chloride 0.9 % injection 3 mL  3 mL Intravenous Q12H Reubin Milan, MD  3 mL at 06/11/15 1000  . sodium chloride 0.9 % injection 3 mL  3 mL Intravenous Q12H Reubin Milan, MD   3 mL at 06/11/15 0248  . sodium chloride 0.9 % injection 3 mL  3 mL Intravenous  PRN Reubin Milan, MD      . Derrill Memo ON 06/12/2015] vancomycin (VANCOCIN) IVPB 1000 mg/200 mL premix  1,000 mg Intravenous Q T,Th,Sa-HD Reginia Naas, [facility]       Labs: Basic Metabolic Panel:  Recent Labs Lab 06/04/15 1152 06/10/15 1520 06/11/15 0530  NA 136 136 135  K 5.9* 5.4* 6.1*  CL  --  97* 98*  CO2  --  23 26  GLUCOSE 89 133* 156*  BUN  --  35* 42*  CREATININE  --  9.71* 10.67*  CALCIUM  --  7.6* 7.2*   Liver Function Tests:  Recent Labs Lab 06/11/15 0530  AST 15  ALT 10*  ALKPHOS 103  BILITOT 0.3  PROT 6.7  ALBUMIN 3.0*   No results for input(s): LIPASE, AMYLASE in the last 168 hours. No results for input(s): AMMONIA in the last 168 hours. CBC:  Recent Labs Lab 06/04/15 1152 06/10/15 1520 06/11/15 0530  WBC  --  5.9 5.3  HGB 15.3* 11.8* 10.1*  HCT 45.0 37.4 32.3*  MCV  --  77.8* 75.8*  PLT  --  88* 89*   Cardiac Enzymes:  Recent Labs Lab 06/11/15 0530  TROPONINI <0.03   CBG:  Recent Labs Lab 06/04/15 1025 06/04/15 1530 06/11/15 0218 06/11/15 0812  GLUCAP 115* 82 137* 121*   Iron Studies: No results for input(s): IRON, TIBC, TRANSFERRIN, FERRITIN in the last 72 hours. Studies/Results: Dg Chest 2 View  06/10/2015   CLINICAL DATA:  Shortness of breath 4 days ago  EXAM: CHEST  2 VIEW  COMPARISON:  04/01/2015  FINDINGS: Cardiomediastinal silhouette is stable. There is dual lumen right IJ catheter with tip in right atrium. Linear atelectasis or infiltrate noted bilateral lower lobe. No pulmonary edema.  IMPRESSION: Bilateral lower lobe linear atelectasis or infiltrate. No pulmonary edema. Dual lumen right IJ catheter with tip in right atrium.   Electronically Signed   By: Lahoma Crocker M.D.   On: 06/10/2015 16:07   Ct Angio Chest Pe W/cm &/or Wo Cm  06/10/2015   CLINICAL DATA:  54 year old female with shortness of breath. Recent procedure on the left arm with pain in the left arm.  EXAM: CT ANGIOGRAPHY CHEST WITH CONTRAST  TECHNIQUE:  Multidetector CT imaging of the chest was performed using the standard protocol during bolus administration of intravenous contrast. Multiplanar CT image reconstructions and MIPs were obtained to evaluate the vascular anatomy.  CONTRAST:  33mL OMNIPAQUE IOHEXOL 350 MG/ML SOLN  COMPARISON:  Radiograph dated 06/09/2015  FINDINGS: There are bibasilar linear atelectasis/ scarring. There is mild diffuse ground-glass opacity throughout the lungs, likely atelectatic changes. Pneumonia is less likely. There is an 8 mm subpleural nodule in the lingula (series 4, image 50). Follow-up as per Fleischner Society criteria recommended. There is no focal consolidation, pleural effusion, or pneumothorax. The central airways appear patent.  The thoracic aorta appears unremarkable. There is mild dilatation of the main pulmonary trunk indicative of a degree of pulmonary hypertension.  Evaluation of the pulmonary arteries is limited due to respiratory motion artifact as well as suboptimal opacification of the peripheral branches. No definite pulmonary emboli identified. There is no hilar or mediastinal adenopathy. Cardiomegaly. There is coronary vascular calcification. No pericardial effusion.  The esophagus is predominantly collapsed. The thyroid gland is not visualized possibly surgically absent. Clinical correlation is recommended.  There is no axillary adenopathy. The chest wall soft tissues appear unremarkable. The osseous structures are intact. There is mild compression of the superior endplate of the [id number] vertebra, likely chronic. No definite acute fracture identified. Clinical correlation is recommended.  There are moderate renal atrophy bilaterally. The visualized upper abdomen otherwise appear unremarkable.  Review of the MIP images confirms the above findings.  IMPRESSION: No CT evidence of pulmonary embolism.  An 8 mm lingular subpleural nodule If the patient is at high risk for bronchogenic carcinoma, follow-up chest CT at  3-29months is recommended. If the patient is at low risk for bronchogenic carcinoma, follow-up chest CT at 6-12 months is recommended. This recommendation follows the consensus statement: Guidelines for Management of Small Pulmonary Nodules Detected on CT Scans: A Statement from the [place] as published in Radiology 2005; 237:395-400.   Electronically Signed   By: Anner Crete M.D.   On: 06/10/2015 23:42    ROS: As per HPI otherwise negative.  Review of Systems: Gen: Denies any fever, chills, sweats, anorexia, fatigue, weakness, malaise, weight loss, and sleep disorder HEENT: No visual complaints, No history of Retinopathy. Normal external appearance No Epistaxis or Sore throat. No sinusitis.   CV: Denies chest pain, angina, palpitations, syncope, orthopnea, PND, peripheral edema, and claudication. Resp: Denies dyspnea at rest, dyspnea with exercise, cough, sputum, wheezing, coughing up blood, and pleurisy. GI: Denies vomiting blood, jaundice, and fecal incontinence.   Denies dysphagia or odynophagia. GU : Denies urinary burning, blood in urine, urinary frequency, urinary hesitancy, nocturnal urination, and urinary incontinence.  No renal calculi. MS: Denies joint pain, limitation of movement, and swelling, stiffness, low back pain, extremity pain. Denies muscle weakness, cramps, atrophy.  No use of non steroidal antiinflammatory drugs. Derm: Denies rash, itching, dry skin, hives, moles, warts, or unhealing ulcers.  Psych: Denies depression, anxiety, memory loss, suicidal ideation, hallucinations, paranoia, and confusion. Heme: Denies bruising, bleeding, and enlarged lymph nodes. Neuro: No headache.  No diplopia. No dysarthria.  No dysphasia.  No history of CVA.  No Seizures. No paresthesias.  No weakness. Endocrine No DM.  No Thyroid disease.  No Adrenal disease.  Physical Exam: Filed Vitals:   06/11/15 0030 06/11/15 0045 06/11/15 0223 06/11/15 0537  BP: 127/45 128/48 179/72  132/61  Pulse: 67 74 80 63  Temp:   98.1 F (36.7 C) 98.8 F (37.1 C)  TempSrc:   Oral Oral  Resp: 20 14 20 18   Height:   5\' 4"  (1.626 m)   Weight:   99.428 kg (219 lb 3.2 oz)   SpO2: 98% 94% 94% 95%     General: Well developed, well nourished, in no acute distress. Head: Normocephalic, atraumatic, sclera non-icteric, mucus membranes are moist Neck: Supple. JVD not elevated. Lungs: Clear bilaterally, decreased in bases. Breathing is unlabored. Heart: RRR with S1 S2. No murmurs, rubs, or gallops appreciated. Edema LUA AVG. Abdomen: Soft, non-tender, non-distended with normoactive bowel sounds. No rebound/guarding. No obvious abdominal masses. Lower extremities:without edema or ischemic changes, no open wounds  Neuro: Alert and oriented X 3. Moves all extremities spontaneously. Psych:  Responds to questions appropriately with a normal affect. Dialysis Access: RIJ Tunneled HD Catheter, Drsg C/D/I. LUA AVG + thrill/+ bruit.   [place] on MWF   4h   96.5kg  2/2.25 bath  P4  R IJ cath (placed last week), LUA AVF revised recently and  not using yet d/t swelling Venofer 50 mg / wk  Assessment/Plan: 1.  Pleuritic Chest Pain: S/P R. Perm cath placement 06/05/15. VVS consulted, Primary managing. Started on Vanc. BC pending.   2.  Hyperkalemia: K+ 6.1 without evidence of hemolysis. Will run one hour 1 K bath then recheck K+. Adjust bath if K+less than 4.6. 3.  ESRD -  MWF @ [facility]. HD today.   4. Hypertension/volume  - Take metoprolol, nifedipine XL and Clonidine for BP at home. OP EDW 96.5. Current wt 99.5 kg. Will attempt UFG 3-3.5 liters 5.  Anemia  - 10.1. No ESA OP. Will follow HGBs. 6.  Metabolic bone disease -  Continue renvela and sensipar as ordered 7.  Nutrition - Renal/Carb mod diet. Albumin 3.0. Renal Vit/Nepro. 8.  DM: Per primary.  Rita H. Owens Shark, NP-C 06/11/2015, 8:51 AM  [employer] [phone]  Pt seen, examined and agree w A/P as above. ESRD patient with  pleuritic CP and SOB since R IJ cath was placed last week.  Don't know what would be causing chest pain after cath placement, will ask vasc surgery to see.  She looks wet on the CT scan w ground-glass changes and thickened fissures at the lower lobes.  Will try for extra volume and possibly lower dry wt.   Kelly Splinter MD pager [phone]    cell [phone] 06/11/2015, 12:28 PM

## 2015-06-11 NOTE — Progress Notes (Signed)
Patient Demographics:    Magaline Kosiorek, is a 54 y.o. female, DOB - 09-Jun-1961, HR:[id number]  Admit date - 06/10/2015   Admitting Physician Reubin Milan, MD  Outpatient Primary MD for the patient is Ricke Hey, MD  LOS - 0   Chief Complaint  Patient presents with  . Shortness of Breath        Subjective:    [name] today has, No headache, No chest pain, No abdominal pain - No Nausea, No new weakness tingling or numbness, No Cough - SOB. Mild R IJ catheter site pain.   Assessment  & Plan :     1. Right IJ dialysis catheter site pain and discomfort. X-ray reveals proper placement, no signs of infection externally, catheter was placed by Dr. Augustin Coupe at the nephrology office. Nephrology team has been consulted. Further management will be deferred to them.   2. ESRD. Renal on board for dialysis per schedule.   3. Shortness of breath. Could be mild fluid overload due to underlying ESRD, questionable atelectasis on x-ray, no signs of pneumonia likely. Afebrile, no white count, we'll monitor 24 hours if stable will stop antibiotic. Fluid removal via dialysis. Continue supportive care as needed currently off oxygen.   4. Hypothyroidism. Continue home dose Synthroid.   5. Dyslipidemia. On statin continued.   6. Essential hypertension. Stable on current regimen which includes nifedipine, beta blocker along with clonidine.   7. DM type II. Currently on Lantus along with sliding scale which will be continued.  CBG (last 3)   Recent Labs  06/11/15 0218 06/11/15 0812  GLUCAP 137* 121*        Code Status : Full  Family Communication  : None present  Disposition Plan  : TBD  Consults  :  Renal  Procedures  :    DVT Prophylaxis  :   Heparin    Lab Results  Component  Value Date   PLT 89* 06/11/2015    Inpatient Medications  Scheduled Meds: . aspirin  81 mg Oral Daily  . cefTAZidime (FORTAZ)  IV  2 g Intravenous Q M,W,F-2000  . cinacalcet  60 mg Oral Once per day on Mon Wed Fri  . cloNIDine  0.2 mg Oral BID  . ferric gluconate (FERRLECIT/NULECIT) IV  62.5 mg Intravenous Once  . insulin aspart  0-9 Units Subcutaneous TID WC  . insulin aspart protamine- aspart  20 Units Subcutaneous Q supper  . insulin aspart protamine- aspart  30 Units Subcutaneous Q breakfast  . levothyroxine  125 mcg Oral QAC breakfast  . metoprolol succinate  100 mg Oral Q supper  . NIFEdipine  90 mg Oral Daily  . pantoprazole  40 mg Oral Daily  . pravastatin  40 mg Oral QHS  . pregabalin  75 mg Oral BID  . sevelamer carbonate  2,400 mg Oral TID WC  . sodium chloride  3 mL Intravenous Q12H  . sodium chloride  3 mL Intravenous Q12H  . vancomycin  1,000 mg Intravenous Q M,W,F-HD   Continuous Infusions:  PRN Meds:.sodium chloride, ondansetron **OR** ondansetron (ZOFRAN) IV, oxyCODONE-acetaminophen **AND** oxyCODONE, promethazine, sevelamer carbonate, sodium chloride  Antibiotics  :    Anti-infectives    Start     Dose/Rate Route Frequency  Ordered Stop   06/12/15 1200  cefTAZidime (FORTAZ) 2 g in dextrose 5 % 50 mL IVPB  Status:  Discontinued     2 g 100 mL/hr over 30 Minutes Intravenous Every T-Th-Sa (Hemodialysis) 06/10/15 2058 06/11/15 1010   06/12/15 1200  vancomycin (VANCOCIN) IVPB 1000 mg/200 mL premix  Status:  Discontinued     1,000 mg 200 mL/hr over 60 Minutes Intravenous Every T-Th-Sa (Hemodialysis) 06/10/15 2058 06/11/15 1010   06/11/15 2000  cefTAZidime (FORTAZ) 2 g in dextrose 5 % 50 mL IVPB     2 g 100 mL/hr over 30 Minutes Intravenous Every M-W-F (2000) 06/11/15 1010     06/11/15 1200  vancomycin (VANCOCIN) IVPB 1000 mg/200 mL premix     1,000 mg 200 mL/hr over 60 Minutes Intravenous Every M-W-F (Hemodialysis) 06/11/15 1010     06/10/15 2045  cefTAZidime  (FORTAZ) 2 g in dextrose 5 % 50 mL IVPB     2 g 100 mL/hr over 30 Minutes Intravenous  Once 06/10/15 2031 06/10/15 2233   06/10/15 2045  vancomycin (VANCOCIN) 1,750 mg in sodium chloride 0.9 % 500 mL IVPB     1,750 mg 250 mL/hr over 120 Minutes Intravenous  Once 06/10/15 2031 06/11/15 0037        Objective:   Filed Vitals:   06/11/15 1100 06/11/15 1130 06/11/15 1200 06/11/15 1230  BP: 130/72 116/59 124/58 135/72  Pulse: 74 73 74 72  Temp:      TempSrc:      Resp:  15 24 19   Height:      Weight:      SpO2:        Wt Readings from Last 3 Encounters:  06/11/15 100.5 kg (221 lb 9 oz)  06/04/15 93.895 kg (207 lb)  05/16/15 101.606 kg (224 lb)     Intake/Output Summary (Last 24 hours) at 06/11/15 1247 Last data filed at 06/11/15 0600  Gross per 24 hour  Intake    120 ml  Output      0 ml  Net    120 ml     Physical Exam  Awake Alert, Oriented X 3, No new F.N deficits, Normal affect [place].AT,PERRAL Supple Neck,No JVD, No cervical lymphadenopathy appriciated.  Symmetrical Chest wall movement, Good air movement bilaterally, CTAB RRR,No Gallops,Rubs or new Murmurs, No Parasternal Heave +ve B.Sounds, Abd Soft, No tenderness, No organomegaly appriciated, No rebound - guarding or rigidity. No Cyanosis, Clubbing or edema, No new Rash or bruise, L Arm AV fistula site stable, R IJ dialysis catheter appears stable no signs of infection    Data Review:   Micro Results Recent Results (from the past 240 hour(s))  Blood culture (routine x 2)     Status: None (Preliminary result)   Collection Time: 06/10/15  8:17 PM  Result Value Ref Range Status   Specimen Description BLOOD RIGHT HAND  Final   Special Requests BOTTLES DRAWN AEROBIC AND ANAEROBIC 4CC  Final   Culture NO GROWTH < 24 HOURS  Final   Report Status PENDING  Incomplete  Blood culture (routine x 2)     Status: None (Preliminary result)   Collection Time: 06/10/15  8:20 PM  Result Value Ref Range Status   Specimen  Description BLOOD RIGHT FINGER  Final   Special Requests BOTTLES DRAWN AEROBIC ONLY 2CC  Final   Culture NO GROWTH < 24 HOURS  Final   Report Status PENDING  Incomplete  MRSA PCR Screening     Status: None  Collection Time: 06/11/15  2:39 AM  Result Value Ref Range Status   MRSA by PCR NEGATIVE NEGATIVE Final    Comment:        The GeneXpert MRSA Assay (FDA approved for NASAL specimens only), is one component of a comprehensive MRSA colonization surveillance program. It is not intended to diagnose MRSA infection nor to guide or monitor treatment for MRSA infections.     Radiology Reports Dg Chest 2 View  06/10/2015   CLINICAL DATA:  Shortness of breath 4 days ago  EXAM: CHEST  2 VIEW  COMPARISON:  04/01/2015  FINDINGS: Cardiomediastinal silhouette is stable. There is dual lumen right IJ catheter with tip in right atrium. Linear atelectasis or infiltrate noted bilateral lower lobe. No pulmonary edema.  IMPRESSION: Bilateral lower lobe linear atelectasis or infiltrate. No pulmonary edema. Dual lumen right IJ catheter with tip in right atrium.   Electronically Signed   By: Lahoma Crocker M.D.   On: 06/10/2015 16:07   Ct Angio Chest Pe W/cm &/or Wo Cm  06/10/2015   CLINICAL DATA:  54 year old female with shortness of breath. Recent procedure on the left arm with pain in the left arm.  EXAM: CT ANGIOGRAPHY CHEST WITH CONTRAST  TECHNIQUE: Multidetector CT imaging of the chest was performed using the standard protocol during bolus administration of intravenous contrast. Multiplanar CT image reconstructions and MIPs were obtained to evaluate the vascular anatomy.  CONTRAST:  38mL OMNIPAQUE IOHEXOL 350 MG/ML SOLN  COMPARISON:  Radiograph dated 06/09/2015  FINDINGS: There are bibasilar linear atelectasis/ scarring. There is mild diffuse ground-glass opacity throughout the lungs, likely atelectatic changes. Pneumonia is less likely. There is an 8 mm subpleural nodule in the lingula (series 4, image 50).  Follow-up as per Fleischner Society criteria recommended. There is no focal consolidation, pleural effusion, or pneumothorax. The central airways appear patent.  The thoracic aorta appears unremarkable. There is mild dilatation of the main pulmonary trunk indicative of a degree of pulmonary hypertension.  Evaluation of the pulmonary arteries is limited due to respiratory motion artifact as well as suboptimal opacification of the peripheral branches. No definite pulmonary emboli identified. There is no hilar or mediastinal adenopathy. Cardiomegaly. There is coronary vascular calcification. No pericardial effusion. The esophagus is predominantly collapsed. The thyroid gland is not visualized possibly surgically absent. Clinical correlation is recommended.  There is no axillary adenopathy. The chest wall soft tissues appear unremarkable. The osseous structures are intact. There is mild compression of the superior endplate of the [id number] vertebra, likely chronic. No definite acute fracture identified. Clinical correlation is recommended.  There are moderate renal atrophy bilaterally. The visualized upper abdomen otherwise appear unremarkable.  Review of the MIP images confirms the above findings.  IMPRESSION: No CT evidence of pulmonary embolism.  An 8 mm lingular subpleural nodule If the patient is at high risk for bronchogenic carcinoma, follow-up chest CT at 3-18months is recommended. If the patient is at low risk for bronchogenic carcinoma, follow-up chest CT at 6-12 months is recommended. This recommendation follows the consensus statement: Guidelines for Management of Small Pulmonary Nodules Detected on CT Scans: A Statement from the [employer] as published in Radiology 2005; 237:395-400.   Electronically Signed   By: Anner Crete M.D.   On: 06/10/2015 23:42     CBC  Recent Labs Lab 06/10/15 1520 06/11/15 0530  WBC 5.9 5.3  HGB 11.8* 10.1*  HCT 37.4 32.3*  PLT 88* 89*  MCV 77.8* 75.8*  MCH  24.5* 23.7*  MCHC 31.6 31.3  RDW 21.5* 20.7*    Chemistries   Recent Labs Lab 06/10/15 1520 06/11/15 0530  NA 136 135  K 5.4* 6.1*  CL 97* 98*  CO2 23 26  GLUCOSE 133* 156*  BUN 35* 42*  CREATININE 9.71* 10.67*  CALCIUM 7.6* 7.2*  AST  --  15  ALT  --  10*  ALKPHOS  --  103  BILITOT  --  0.3   ------------------------------------------------------------------------------------------------------------------ estimated creatinine clearance is 6.9 mL/min (by C-G formula based on Cr of 10.67). ------------------------------------------------------------------------------------------------------------------ No results for input(s): HGBA1C in the last 72 hours. ------------------------------------------------------------------------------------------------------------------ No results for input(s): CHOL, HDL, LDLCALC, TRIG, CHOLHDL, LDLDIRECT in the last 72 hours. ------------------------------------------------------------------------------------------------------------------ No results for input(s): TSH, T4TOTAL, T3FREE, THYROIDAB in the last 72 hours.  Invalid input(s): FREET3 ------------------------------------------------------------------------------------------------------------------ No results for input(s): VITAMINB12, FOLATE, FERRITIN, TIBC, IRON, RETICCTPCT in the last 72 hours.  Coagulation profile No results for input(s): INR, PROTIME in the last 168 hours.  No results for input(s): DDIMER in the last 72 hours.  Cardiac Enzymes  Recent Labs Lab 06/11/15 0530  TROPONINI <0.03   ------------------------------------------------------------------------------------------------------------------ Invalid input(s): POCBNP   Time Spent in minutes 35   SINGH,PRASHANT K M.D on 06/11/2015 at 12:47 PM  Between 7am to 7pm - Pager - [phone]  After 7pm go to [email or web address] - password [facility]  [facility] -  Office  [phone]

## 2015-06-11 NOTE — H&P (Signed)
[facility] History and Physical  [name] [id number] DOB: [birth year] DOA: 06/10/2015  Referring physician: Sherwood Gambler, MD PCP: Ricke Hey, MD   Chief Complaint: Shortness of breath.  HPI: [name] is a 54 y.o. female with a past medical history of end-stage renal disease on hemodialysis, type 2 diabetes, hypothyroidism, hyperlipidemia, GERD, hypertension, diabetic peripheral neuropathy, anemia renal disease who comes to the ER with complaints of pleuritic chest pain and shortness of breath for 5 days since she had a temporary subclavian hemodialysis catheter placed. She states that the pain and the dyspnea have been progressively worse since then. She states that she has had previous temporary dialysis catheters placed, but this catheter placement was more difficult and painful than usual. She denies fever, chills, typical chest pain, palpitations, dizziness, diaphoresis. She states that the pain is better now after she was medicated earlier.   Review of Systems:  Constitutional:  No weight loss, night sweats, Fevers, chills, fatigue.  HEENT:  No headaches, Difficulty swallowing,Tooth/dental problems,Sore throat,  No sneezing, itching, ear ache, nasal congestion, post nasal drip,  Cardio-vascular:  No chest pain, Orthopnea, PND, swelling in lower extremities, anasarca, dizziness, palpitations  GI:  Frequent nausea. No heartburn, indigestion, abdominal pain,  vomiting, diarrhea, change in bowel habits, loss of appetite  Resp:  Positive pleuritic chest pain, shortness of breath with exertion or at rest.  No excess mucus, no productive cough, No non-productive cough, No coughing up of blood.No change in color of mucus.No wheezing.No chest wall deformity  Skin:  no rash or lesions.  GU:  Positive  change in color of urine  no dysuria, urgency or frequency. No flank pain.  Musculoskeletal:  No joint pain or swelling. No decreased range of motion. No  back pain.  Psych:  No change in mood or affect. No depression or anxiety. No memory loss.   Past Medical History  Diagnosis Date  . Diabetes mellitus, type II, insulin dependent   . Paroxysmal supraventricular tachycardia   . End stage renal disease on dialysis     Hemodialysis T, TH, Sat  . Hypothyroidism (acquired)   . Essential hypertension     Difficult control. On multiple medications.  . Diabetic peripheral neuropathy associated with type 2 diabetes mellitus   . Peripheral arterial disease December 2014    @ [place]: a) RSFA PTA (01/2013); b) L SFA [place] Atherectomy/PTA with 3V runoff; LEA Dopplers January 2016 [facility] post L SFA PTA): Mild, insignificant disease in the left CFA, profunda, SFA, popliteal artery and tibioperoneal trunk.  . 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 Hemodialysis patient   . Hyperlipidemia   . Sickle cell trait   . Gastroparesis due to DM   . GERD (gastroesophageal reflux disease)   . Heart murmur   . Arthritis    Past Surgical History  Procedure Laterality Date  . Abdominal hysterectomy    . Knee surgery      right  . Thyroid surgery    . Thyroidectomy    . Knee arthroplasty    . Cataract extraction, bilateral    . Colonoscopy  07/19/2012    Procedure: COLONOSCOPY;  Surgeon: Beryle Beams, MD;  Location: [facility];  Service: Endoscopy;  Laterality: N/A;  . Arteriovenous graft placement Left 2012    Dr. Harrington Challenger  2nd graft  . Shuntogram Left 02/14/2013    Procedure: FISTULOGRAM;  Surgeon: Serafina Mitchell, MD;  Location: [facility] CATH LAB;  Service: Cardiovascular;  Laterality: Left;  . Nm myoview ltd  September 2009; 01/15/2015    a) @ [place] [facility] - Negative for inducible or reversible ischemia with pharmacologic stress. EF 63% ; b) [facility]: EF 68%. No RWMA, negative S4 reversible coronary ischemia or infarction.  . Carotid ultrasound   May 2011    Normal ([place], [place])  . Transthoracic echocardiogram  October 2013; January 2015    a) [name]) 2013: normal LV size and function. Mild LVH. EF 55-60%. no RWMA, No valve disease; b) [place] 1/'15: EF 70-75%, mild MR. Normal diastolic dysfunction  . Peripheral vascular catheterization  May 2014; December 2014    a) RSFA & PopA mod-severe disease -- R SFA PTA, Med Rx of L SFA; b) distal abdominal and iliac & CFA arteries patent.  m-d LSFA long 60%, LPop 60% -> 3 V runoff. RSFA ~40% mid, RPop 60%, 3V runoff  . Peripheral angioplasty  May 2014    PTA of R SFA-PopA.  Marland Kitchen Peripheral athrectomy  December 2014    [place] Atherectomy of L SFA-L PopA reduction of 60% to 10% stenosis   . Fistulogram Left 06/04/2015    Procedure: FISTULOGRAM;  Surgeon: Conrad Grazierville, MD;  Location: [facility];  Service: Vascular;  Laterality: Left;  . Revision of arteriovenous goretex graft Left 06/04/2015    Procedure: REVISION OF ARTERIOVENOUS GORETEX GRAFT;  Surgeon: Conrad Ridgeville, MD;  Location: [facility];  Service: Vascular;  Laterality: Left;   Social History:  reports that she quit smoking about 21 years ago. Her smoking use included Cigarettes. She has never used smokeless tobacco. She reports that she uses illicit drugs (Marijuana). She reports that she does not drink alcohol.  No Known Allergies  Family History  Problem Relation Age of Onset  . Diabetes Mother   . Hypertension Mother   . Heart attack Mother   . Diabetes Father   . Hypertension Father     Prior to Admission medications   Medication Sig Start Date End Date Taking? Authorizing Provider  aspirin 81 MG chewable tablet Chew 81 mg by mouth daily.   Yes Historical Provider, MD  cinacalcet (SENSIPAR) 60 MG tablet Take 60 mg by mouth every Monday, Wednesday, and Friday. Take 1 capsule (60 mg) by mouth after dialysis on Monday, Wednesday, Friday   Yes Historical Provider, MD  cloNIDine (CATAPRES) 0.2 MG tablet Take 0.2 mg by mouth 2 (two) times daily.     Yes Historical Provider, MD  esomeprazole (NEXIUM) 40 MG capsule Take 40 mg by mouth daily before breakfast.   Yes Historical Provider, MD  Insulin Lispro Prot & Lispro (HUMALOG MIX 75/25 KWIKPEN) (75-25) 100 UNIT/ML Kwikpen Inject 20-30 Units into the skin 2 (two) times daily with a meal. Inject 30 units subcutaneously with breakfast and 20 units with supper   Yes Historical Provider, MD  levothyroxine (SYNTHROID, LEVOTHROID) 125 MCG tablet Take 125 mcg by mouth daily before breakfast.    Yes Historical Provider, MD  metoprolol succinate (TOPROL-XL) 100 MG 24 hr tablet Take 100 mg by mouth daily with supper. Take with or immediately following a meal.   Yes Historical Provider, MD  NIFEdipine (PROCARDIA XL/ADALAT-CC) 90 MG 24 hr tablet Take 90 mg by mouth daily.   Yes Historical Provider, MD  oxyCODONE-acetaminophen (PERCOCET) 10-325 MG per tablet Take 1 tablet by mouth every 6 (six) hours as needed for pain. 06/04/15  Yes Samantha J Rhyne, PA-C  pravastatin (  PRAVACHOL) 40 MG tablet Take 40 mg by mouth at bedtime.    Yes Historical Provider, MD  pregabalin (LYRICA) 75 MG capsule Take 75 mg by mouth 2 (two) times daily.   Yes Historical Provider, MD  promethazine (PHENERGAN) 25 MG tablet Take 25 mg by mouth every 8 (eight) hours as needed for nausea or vomiting.    Yes Historical Provider, MD  sevelamer carbonate (RENVELA) 800 MG tablet Take 800-2,400 mg by mouth See admin instructions. Take 3 tablets (2400 mg) by mouth with meals, and 1 tablet (800 mg) with snacks   Yes Historical Provider, MD  terbinafine (LAMISIL) 250 MG tablet Take 250 mg by mouth daily at 6 PM. 05/30/15  Yes Historical Provider, MD   Physical Exam: Filed Vitals:   06/11/15 0000 06/11/15 0015 06/11/15 0030 06/11/15 0045  BP: 121/50 126/50 127/45 128/48  Pulse: 73 67 67 74  Temp:      TempSrc:      Resp: 26 17 20 14   SpO2: 90% 90% 98% 94%    Wt Readings from Last 3 Encounters:  06/04/15 93.895 kg (207 lb)  05/16/15  101.606 kg (224 lb)  04/01/15 97.977 kg (216 lb)    General:  Appears calm and comfortable Eyes: PERRL, normal lids, irises & conjunctiva ENT: grossly normal hearing, lips & tongue Neck: no LAD, masses or thyromegaly Cardiovascular: RRR, no m/r/g. No LE edema. Catheter in place. Telemetry: SR, no arrhythmias  Respiratory: CTA bilaterally, no w/r/r. Normal respiratory effort. Abdomen: soft, ntnd Skin: no rash or induration seen on limited exam Musculoskeletal: Left arm edema and AV graft. Psychiatric: grossly normal mood and affect, speech fluent and appropriate Neurologic: grossly non-focal.          Labs on Admission:  Basic Metabolic Panel:  Recent Labs Lab 06/04/15 1152 06/10/15 1520  NA 136 136  K 5.9* 5.4*  CL  --  97*  CO2  --  23  GLUCOSE 89 133*  BUN  --  35*  CREATININE  --  9.71*  CALCIUM  --  7.6*   CBC:  Recent Labs Lab 06/04/15 1152 06/10/15 1520  WBC  --  5.9  HGB 15.3* 11.8*  HCT 45.0 37.4  MCV  --  77.8*  PLT  --  88*    CBG:  Recent Labs Lab 06/04/15 1025 06/04/15 1530  GLUCAP 115* 82    Radiological Exams on Admission: Dg Chest 2 View  06/10/2015   CLINICAL DATA:  Shortness of breath 4 days ago  EXAM: CHEST  2 VIEW  COMPARISON:  04/01/2015  FINDINGS: Cardiomediastinal silhouette is stable. There is dual lumen right IJ catheter with tip in right atrium. Linear atelectasis or infiltrate noted bilateral lower lobe. No pulmonary edema.  IMPRESSION: Bilateral lower lobe linear atelectasis or infiltrate. No pulmonary edema. Dual lumen right IJ catheter with tip in right atrium.   Electronically Signed   By: Lahoma Crocker M.D.   On: 06/10/2015 16:07   Ct Angio Chest Pe W/cm &/or Wo Cm  06/10/2015   CLINICAL DATA:  54 year old female with shortness of breath. Recent procedure on the left arm with pain in the left arm.  EXAM: CT ANGIOGRAPHY CHEST WITH CONTRAST  TECHNIQUE: Multidetector CT imaging of the chest was performed using the standard  protocol during bolus administration of intravenous contrast. Multiplanar CT image reconstructions and MIPs were obtained to evaluate the vascular anatomy.  CONTRAST:  50mL OMNIPAQUE IOHEXOL 350 MG/ML SOLN  COMPARISON:  Radiograph dated 06/09/2015  FINDINGS: There are bibasilar linear atelectasis/ scarring. There is mild diffuse ground-glass opacity throughout the lungs, likely atelectatic changes. Pneumonia is less likely. There is an 8 mm subpleural nodule in the lingula (series 4, image 50). Follow-up as per Fleischner Society criteria recommended. There is no focal consolidation, pleural effusion, or pneumothorax. The central airways appear patent.  The thoracic aorta appears unremarkable. There is mild dilatation of the main pulmonary trunk indicative of a degree of pulmonary hypertension.  Evaluation of the pulmonary arteries is limited due to respiratory motion artifact as well as suboptimal opacification of the peripheral branches. No definite pulmonary emboli identified. There is no hilar or mediastinal adenopathy. Cardiomegaly. There is coronary vascular calcification. No pericardial effusion. The esophagus is predominantly collapsed. The thyroid gland is not visualized possibly surgically absent. Clinical correlation is recommended.  There is no axillary adenopathy. The chest wall soft tissues appear unremarkable. The osseous structures are intact. There is mild compression of the superior endplate of the QA348G vertebra, likely chronic. No definite acute fracture identified. Clinical correlation is recommended.  There are moderate renal atrophy bilaterally. The visualized upper abdomen otherwise appear unremarkable.  Review of the MIP images confirms the above findings.  IMPRESSION: No CT evidence of pulmonary embolism.  An 8 mm lingular subpleural nodule If the patient is at high risk for bronchogenic carcinoma, follow-up chest CT at 3-87months is recommended. If the patient is at low risk for bronchogenic  carcinoma, follow-up chest CT at 6-12 months is recommended. This recommendation follows the consensus statement: Guidelines for Management of Small Pulmonary Nodules Detected on CT Scans: A Statement from the [place] as published in Radiology 2005; 237:395-400.   Electronically Signed   By: Anner Crete M.D.   On: 06/10/2015 23:42    EKG: Independently reviewed. Vent. rate 72 BPM PR interval 164 ms QRS duration 76 ms QT/QTc 414/453 ms P-R-T axes 57 59 40  Normal sinus rhythm Cannot rule out Anterior infarct , age undetermined Abnormal ECG   Assessment/Plan Principal Problem:   Pleuritic chest pain   Hypoxia Secondary to catheter placement. Decreased inspiratory effort to avoid pain has caused some atelectasis. Opinion supplemental oxygen. Continue IV antibiotic coverage for line sepsis and pneumonia. Continue pain management until the patient is  evaluated by nephrology or vascular surgery.   Active Problems:   Essential hypertension The current antihypertensive therapy and monitor blood pressure.    Thyroid disease Continue levothyroxine and monitor TSH.     End stage renal disease on dialysis Continue hemodialysis once nephrology or vascular surgery evaluates catheter.    Hyperkalemia Per patient, she usually has a high normal or high potassium level. Continue hemodialysis and monitor level.    Diabetes mellitus, type II, insulin dependent Continue long-acting insulin, CBG monitoring and regular insulin sliding scale.    Hyperlipidemia Continue pravastatin and monitor LFTs.    Code Status: Full code. DVT Prophylaxis: Lovenox subcutaneous. Family Communication:  Disposition Plan: Admit for monitoring and pain control. Consult vascular surgery in the morning.  Time spent: Over 70 minutes were spent in the process of this admission.  Reubin Milan [facility] Pager [phone]

## 2015-06-11 NOTE — Progress Notes (Signed)
Called ER RN for report. Room ready.  

## 2015-06-12 LAB — BASIC METABOLIC PANEL
ANION GAP: 13 (ref 5–15)
BUN: 35 mg/dL — AB (ref 6–20)
CALCIUM: 8.2 mg/dL — AB (ref 8.9–10.3)
CO2: 28 mmol/L (ref 22–32)
Chloride: 95 mmol/L — ABNORMAL LOW (ref 101–111)
Creatinine, Ser: 9.32 mg/dL — ABNORMAL HIGH (ref 0.44–1.00)
GFR calc Af Amer: 5 mL/min — ABNORMAL LOW (ref >60)
GFR calc non Af Amer: 4 mL/min — ABNORMAL LOW (ref >60)
GLUCOSE: 60 mg/dL — AB (ref 65–99)
Potassium: 5.1 mmol/L (ref 3.5–5.1)
Sodium: 136 mmol/L (ref 135–145)

## 2015-06-12 LAB — RENAL FUNCTION PANEL
Albumin: 3.3 g/dL — ABNORMAL LOW (ref 3.5–5.0)
Anion gap: 13 (ref 5–15)
BUN: 35 mg/dL — ABNORMAL HIGH (ref 6–20)
CO2: 27 mmol/L (ref 22–32)
Calcium: 8.2 mg/dL — ABNORMAL LOW (ref 8.9–10.3)
Chloride: 96 mmol/L — ABNORMAL LOW (ref 101–111)
Creatinine, Ser: 9.37 mg/dL — ABNORMAL HIGH (ref 0.44–1.00)
GFR calc Af Amer: 5 mL/min — ABNORMAL LOW (ref >60)
GFR calc non Af Amer: 4 mL/min — ABNORMAL LOW (ref >60)
Glucose, Bld: 64 mg/dL — ABNORMAL LOW (ref 65–99)
Phosphorus: 3.8 mg/dL (ref 2.5–4.6)
Potassium: 4.9 mmol/L (ref 3.5–5.1)
Sodium: 136 mmol/L (ref 135–145)

## 2015-06-12 LAB — GLUCOSE, CAPILLARY
GLUCOSE-CAPILLARY: 61 mg/dL — AB (ref 65–99)
GLUCOSE-CAPILLARY: 75 mg/dL (ref 65–99)
Glucose-Capillary: 58 mg/dL — ABNORMAL LOW (ref 65–99)
Glucose-Capillary: 62 mg/dL — ABNORMAL LOW (ref 65–99)
Glucose-Capillary: 146 mg/dL — ABNORMAL HIGH (ref 65–99)

## 2015-06-12 LAB — CBC
HCT: 37.6 % (ref 36.0–46.0)
Hemoglobin: 12 g/dL (ref 12.0–15.0)
MCH: 24.4 pg — ABNORMAL LOW (ref 26.0–34.0)
MCHC: 31.9 g/dL (ref 30.0–36.0)
MCV: 76.4 fL — ABNORMAL LOW (ref 78.0–100.0)
Platelets: 104 K/uL — ABNORMAL LOW (ref 150–400)
RBC: 4.92 MIL/uL (ref 3.87–5.11)
RDW: 21 % — ABNORMAL HIGH (ref 11.5–15.5)
WBC: 8.9 10*3/uL (ref 4.0–10.5)

## 2015-06-12 MED ORDER — DEXTROSE 50 % IV SOLN
INTRAVENOUS | Status: AC
  Filled 2015-06-12: qty 50

## 2015-06-12 MED ORDER — ALTEPLASE 2 MG IJ SOLR
2.0000 mg | Freq: Once | INTRAMUSCULAR | Status: DC | PRN
  Filled 2015-06-12: qty 2

## 2015-06-12 MED ORDER — SODIUM CHLORIDE 0.9 % IV SOLN: 100.0000 mL | INTRAVENOUS | Status: DC | PRN

## 2015-06-12 MED ORDER — LIDOCAINE HCL (PF) 1 % IJ SOLN: 5.0000 mL | INTRAMUSCULAR | Status: DC | PRN

## 2015-06-12 MED ORDER — DEXTROSE 5 % IV SOLN: 2.0000 g | INTRAVENOUS | Status: DC

## 2015-06-12 MED ORDER — LIDOCAINE-PRILOCAINE 2.5-2.5 % EX CREA: 1.0000 "application " | TOPICAL_CREAM | CUTANEOUS | Status: DC | PRN

## 2015-06-12 MED ORDER — HEPARIN SODIUM (PORCINE) 1000 UNIT/ML DIALYSIS
6500.0000 [IU] | Freq: Once | INTRAMUSCULAR | Status: DC
  Filled 2015-06-12: qty 7

## 2015-06-12 MED ORDER — PENTAFLUOROPROP-TETRAFLUOROETH EX AERO: 1.0000 "application " | INHALATION_SPRAY | CUTANEOUS | Status: DC | PRN

## 2015-06-12 MED ORDER — MOXIFLOXACIN HCL 400 MG PO TABS: 400.0000 mg | ORAL_TABLET | Freq: Every day | ORAL | Status: DC

## 2015-06-12 MED ORDER — ALTEPLASE 2 MG IJ SOLR
2.0000 mg | Freq: Once | INTRAMUSCULAR | Status: DC | PRN
Start: 2015-06-12 — End: 2015-06-12

## 2015-06-12 MED ORDER — HEPARIN SODIUM (PORCINE) 1000 UNIT/ML DIALYSIS: 1000.0000 [IU] | INTRAMUSCULAR | Status: DC | PRN

## 2015-06-12 NOTE — Progress Notes (Signed)
[facility] Progress Note  Assessment/Plan: 1. Pleuritic Chest Pain: S/P R. Perm cath placement 06/05/15. VVS consult cancelled , she will be referred back to Dr. Augustin Coupe of [facility] who placed the catheter. Not an acute issue. OK for dc.  2. Hyperkalemia: K+ 6.1 yesterday. Last K+ 4.2 3. ESRD - MWF @ [facility]. HD yesterday. Will write orders for tomorrow if not DC'd.  4. Hypertension/volume - Take metoprolol, nifedipine XL and Clonidine for BP at home. HD yesterday Net UF 4200. Had evidence of volume overload per chest xray. Post wt 96.5 which is OP EDW. Probably could lower at DC. 5. Anemia - 10.1. No ESA OP. Will follow HGBs. 6. Metabolic bone disease - Continue renvela and sensipar as ordered 7. Nutrition - Renal/Carb mod diet. Albumin 3.0. Renal Vit/Nepro. 8. DM: Per primary.  Rita H. Brown NP-C 06/12/2015, 9:42 AM  [facility] [phone]  Pt seen, examined and agree w A/P as above. Is ok for dc. Patient will be referred to Dr Augustin Coupe at [facility] where the R IJ cath was placed to eval for CP. Will plan for eval tomorrow as OP.  Kelly Splinter MD pager [phone]    cell [phone] 06/12/2015, 12:31 PM    Subjective: "When can I go home?"  Patient frustrated because she only came to hospital to get Xray and doesn't feel progress has been made in finding cause of RIJ catheter pain. States pain is improved, otherwise "I feel OK".   Objective Filed Vitals:   06/11/15 1339 06/11/15 1752 06/11/15 2105 06/12/15 0546  BP: 146/59 140/62 170/65 149/67  Pulse: 81 80 82 65  Temp: 98.5 F (36.9 C) 98.5 F (36.9 C) 99 F (37.2 C) 97.6 F (36.4 C)  TempSrc: Oral Oral Oral Oral  Resp: 20 20 18 16   Height:      Weight:   96.843 kg (213 lb 8 oz)   SpO2: 97% 98% 91% 99%   Physical Exam General: Well nourished, NAD Heart: S1,S2, RRR No M/G/R Lungs: Bilateral breath sounds CTA Abdomen: Soft, nontender with active BS Extremities: No LE edema Hemodialysis: LUA AVG +  thrill/+ bruit.   Hemodialysis Orders: [facility] on MWF 4h 96.5kg 2/2.25 bath P4 R IJ cath (placed last week), LUA AVF revised recently and not using yet d/t swelling Venofer 50 mg / wk  Additional Objective Labs: Basic Metabolic Panel:  Recent Labs Lab 06/10/15 1520 06/11/15 0530 06/11/15 1032  NA 136 135 136  K 5.4* 6.1* 4.2  CL 97* 98* 96*  CO2 23 26  --   GLUCOSE 133* 156* 113*  BUN 35* 42* 28*  CREATININE 9.71* 10.67* 5.10*  CALCIUM 7.6* 7.2*  --    Liver Function Tests:  Recent Labs Lab 06/11/15 0530  AST 15  ALT 10*  ALKPHOS 103  BILITOT 0.3  PROT 6.7  ALBUMIN 3.0*   No results for input(s): LIPASE, AMYLASE in the last 168 hours. CBC:  Recent Labs Lab 06/10/15 1520 06/11/15 0530 06/11/15 1032  WBC 5.9 5.3  --   HGB 11.8* 10.1* 15.0  HCT 37.4 32.3* 44.0  MCV 77.8* 75.8*  --   PLT 88* 89*  --    Blood Culture    Component Value Date/Time   SDES BLOOD RIGHT FINGER 06/10/2015 2020   SPECREQUEST BOTTLES DRAWN AEROBIC ONLY 2CC 06/10/2015 2020   CULT NO GROWTH < 24 HOURS 06/10/2015 2020   REPTSTATUS PENDING 06/10/2015 2020    Cardiac Enzymes:  Recent Labs Lab 06/11/15  0530  TROPONINI <0.03   CBG:  Recent Labs Lab 06/11/15 0812 06/11/15 1350 06/11/15 1647 06/11/15 2207 06/12/15 0758  GLUCAP 121* 102* 171* 85 61*   Iron Studies: No results for input(s): IRON, TIBC, TRANSFERRIN, FERRITIN in the last 72 hours. @lablastinr3 @ Studies/Results: Dg Chest 2 View  06/10/2015   CLINICAL DATA:  Shortness of breath 4 days ago  EXAM: CHEST  2 VIEW  COMPARISON:  04/01/2015  FINDINGS: Cardiomediastinal silhouette is stable. There is dual lumen right IJ catheter with tip in right atrium. Linear atelectasis or infiltrate noted bilateral lower lobe. No pulmonary edema.  IMPRESSION: Bilateral lower lobe linear atelectasis or infiltrate. No pulmonary edema. Dual lumen right IJ catheter with tip in right atrium.   Electronically Signed   By: Lahoma Crocker M.D.    On: 06/10/2015 16:07   Ct Angio Chest Pe W/cm &/or Wo Cm  06/10/2015   CLINICAL DATA:  54 year old female with shortness of breath. Recent procedure on the left arm with pain in the left arm.  EXAM: CT ANGIOGRAPHY CHEST WITH CONTRAST  TECHNIQUE: Multidetector CT imaging of the chest was performed using the standard protocol during bolus administration of intravenous contrast. Multiplanar CT image reconstructions and MIPs were obtained to evaluate the vascular anatomy.  CONTRAST:  4mL OMNIPAQUE IOHEXOL 350 MG/ML SOLN  COMPARISON:  Radiograph dated 06/09/2015  FINDINGS: There are bibasilar linear atelectasis/ scarring. There is mild diffuse ground-glass opacity throughout the lungs, likely atelectatic changes. Pneumonia is less likely. There is an 8 mm subpleural nodule in the lingula (series 4, image 50). Follow-up as per Fleischner Society criteria recommended. There is no focal consolidation, pleural effusion, or pneumothorax. The central airways appear patent.  The thoracic aorta appears unremarkable. There is mild dilatation of the main pulmonary trunk indicative of a degree of pulmonary hypertension.  Evaluation of the pulmonary arteries is limited due to respiratory motion artifact as well as suboptimal opacification of the peripheral branches. No definite pulmonary emboli identified. There is no hilar or mediastinal adenopathy. Cardiomegaly. There is coronary vascular calcification. No pericardial effusion. The esophagus is predominantly collapsed. The thyroid gland is not visualized possibly surgically absent. Clinical correlation is recommended.  There is no axillary adenopathy. The chest wall soft tissues appear unremarkable. The osseous structures are intact. There is mild compression of the superior endplate of the QA348G vertebra, likely chronic. No definite acute fracture identified. Clinical correlation is recommended.  There are moderate renal atrophy bilaterally. The visualized upper abdomen  otherwise appear unremarkable.  Review of the MIP images confirms the above findings.  IMPRESSION: No CT evidence of pulmonary embolism.  An 8 mm lingular subpleural nodule If the patient is at high risk for bronchogenic carcinoma, follow-up chest CT at 3-31months is recommended. If the patient is at low risk for bronchogenic carcinoma, follow-up chest CT at 6-12 months is recommended. This recommendation follows the consensus statement: Guidelines for Management of Small Pulmonary Nodules Detected on CT Scans: A Statement from the [place] as published in Radiology 2005; 237:395-400.   Electronically Signed   By: Anner Crete M.D.   On: 06/10/2015 23:42   Medications:   . aspirin  81 mg Oral Daily  . [START ON 06/13/2015] cefTAZidime (FORTAZ)  IV  2 g Intravenous Q M,W,F-HD  . cinacalcet  60 mg Oral Once per day on Mon Wed Fri  . cloNIDine  0.2 mg Oral BID  . heparin subcutaneous  5,000 Units Subcutaneous 3 times per day  . insulin aspart  0-9 Units Subcutaneous TID WC  . insulin aspart protamine- aspart  20 Units Subcutaneous Q supper  . insulin aspart protamine- aspart  30 Units Subcutaneous Q breakfast  . levothyroxine  125 mcg Oral QAC breakfast  . metoprolol succinate  100 mg Oral Q supper  . NIFEdipine  90 mg Oral Daily  . pantoprazole  40 mg Oral Daily  . pravastatin  40 mg Oral QHS  . pregabalin  75 mg Oral BID  . sevelamer carbonate  2,400 mg Oral TID WC  . sodium chloride  3 mL Intravenous Q12H  . sodium chloride  3 mL Intravenous Q12H  . vancomycin  1,000 mg Intravenous Q M,W,F-HD

## 2015-06-12 NOTE — Progress Notes (Signed)
Patient's CBG 62, paged Dr. Candiss Norse, ordered to give 1/2 D5 but patient declined saying that it makes her sick an also makes her throw up.  She just wanted to eat sandwich and some juice.  Made Dr. Candiss Norse aware of the situation, he was ok and said if her CBG is above 80 after an hour of her eating, she can go home.  Checked the CBG after an hour it was 146.  Patient was feeling good with no signs or any symptoms.  Patient was discharged.

## 2015-06-12 NOTE — Discharge Instructions (Signed)
Follow-up in 1-2 weeks with recommended pulmonary physician for lung nodule.  Follow with Primary MD Karina Hey, MD in 7 days   Get CBC, CMP, 2 view Chest X ray checked  by Primary MD next visit.    Activity: As tolerated with Full fall precautions use walker/cane & assistance as needed   Disposition Home     Diet: Renal-low carbohydrate. Check your Weight same time everyday, if you gain over 2 pounds, or you develop in leg swelling, experience more shortness of breath or chest pain, call your Primary MD immediately. Follow Cardiac Low Salt Diet and 1.5 lit/day fluid restriction.   On your next visit with your primary care physician please Get Medicines reviewed and adjusted.   Please request your Prim.MD to go over all Hospital Tests and Procedure/Radiological results at the follow up, please get all Hospital records sent to your Prim MD by signing hospital release before you go home.   If you experience worsening of your admission symptoms, develop shortness of breath, life threatening emergency, suicidal or homicidal thoughts you must seek medical attention immediately by calling 911 or calling your MD immediately  if symptoms less severe.  You Must read complete instructions/literature along with all the possible adverse reactions/side effects for all the Medicines you take and that have been prescribed to you. Take any new Medicines after you have completely understood and accpet all the possible adverse reactions/side effects.   Do not drive, operating heavy machinery, perform activities at heights, swimming or participation in water activities or provide baby sitting services if your were admitted for syncope or siezures until you have seen by Primary MD or a Neurologist and advised to do so again.  Do not drive when taking Pain medications.    Do not take more than prescribed Pain, Sleep and Anxiety Medications  Special Instructions: If you have smoked or chewed  Tobacco  in the last 2 yrs please stop smoking, stop any regular Alcohol  and or any Recreational drug use.  Wear Seat belts while driving.   Please note  You were cared for by a hospitalist during your hospital stay. If you have any questions about your discharge medications or the care you received while you were in the hospital after you are discharged, you can call the unit and asked to speak with the hospitalist on call if the hospitalist that took care of you is not available. Once you are discharged, your primary care physician will handle any further medical issues. Please note that NO REFILLS for any discharge medications will be authorized once you are discharged, as it is imperative that you return to your primary care physician (or establish a relationship with a primary care physician if you do not have one) for your aftercare needs so that they can reassess your need for medications and monitor your lab values.

## 2015-06-12 NOTE — Progress Notes (Signed)
Patient Discharge: Disposition: Patient discharged to home. Education: Reviewed medications, prescriptions, follow-up visits, educated about daily weights and importance of fluid intake, understood and acknowledged. IV: Discontinued IV before discharge. Telemetry: Discontinued before discharge, CCMD notified. Transportation: Patient walked out of the unit declined w/c. Belongings: Patient took all her belongings with her.

## 2015-06-12 NOTE — Discharge Summary (Signed)
[name], is a 54 y.o. female  DOB [birth year]  MRN [id number].  Admission date:  06/10/2015  Admitting Physician  Reubin Milan, MD  Discharge Date:  06/12/2015   Primary MD  Ricke Hey, MD  Recommendations for primary care physician for things to follow:   Check CBC, CMP 2 view chest x-ray next visit. Needs outpatient follow-up with her nephrologist and nephrology office within a week for recently placed right IJ dialysis catheter at nephrology office with some pain at the site.   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 Diabetes mellitus, type II, insulin dependent   . Paroxysmal supraventricular tachycardia   . End stage renal disease on dialysis     Hemodialysis T, TH, Sat  . Hypothyroidism (acquired)   . Essential hypertension     Difficult control. On multiple medications.  . Diabetic peripheral neuropathy associated with type 2 diabetes mellitus   . Peripheral arterial disease December 2014    @ [facility]: a) RSFA PTA (01/2013); b) L SFA [place] Atherectomy/PTA with 3V runoff; LEA Dopplers January 2016 [facility] post L SFA PTA): Mild, insignificant disease in the left CFA, profunda, SFA, popliteal artery and tibioperoneal trunk.  . 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 Hemodialysis patient   . Hyperlipidemia   . Sickle cell trait   .  Gastroparesis due to DM   . GERD (gastroesophageal reflux disease)   . Heart murmur   . Arthritis     Past Surgical History  Procedure Laterality Date  . Abdominal hysterectomy    . Knee surgery      right  . Thyroid surgery    . Thyroidectomy    . Knee arthroplasty    . Cataract extraction, bilateral    . Colonoscopy  07/19/2012    Procedure: COLONOSCOPY;  Surgeon: Beryle Beams, MD;  Location: [place] [facility];  Service: Endoscopy;  Laterality: N/A;  . Arteriovenous graft placement Left 2012    Dr. Harrington Challenger  2nd graft  . Shuntogram Left 02/14/2013    Procedure: FISTULOGRAM;  Surgeon: Serafina Mitchell, MD;  Location: [facility] CATH LAB;  Service: Cardiovascular;  Laterality: Left;  . Nm myoview ltd  September 2009; 01/15/2015    a) @ [facility] - Negative for inducible or reversible ischemia with pharmacologic stress. EF 63% ; b) [facility]: EF 68%. No RWMA, negative S4 reversible coronary ischemia or infarction.  . Carotid ultrasound   May 2011    Normal ([place], [place])  . Transthoracic echocardiogram  October 2013; January 2015  a) [name]) 2013: normal LV size and function. Mild LVH. EF 55-60%. no RWMA, No valve disease; b) [facility] 1/'15: EF 70-75%, mild MR. Normal diastolic dysfunction  . Peripheral vascular catheterization  May 2014; December 2014    a) RSFA & PopA mod-severe disease -- R SFA PTA, Med Rx of L SFA; b) distal abdominal and iliac & CFA arteries patent.  m-d LSFA long 60%, LPop 60% -> 3 V runoff. RSFA ~40% mid, RPop 60%, 3V runoff  . Peripheral angioplasty  May 2014    PTA of R SFA-PopA.  Marland Kitchen Peripheral athrectomy  December 2014    [place] Atherectomy of L SFA-L PopA reduction of 60% to 10% stenosis   . Fistulogram Left 06/04/2015    Procedure: FISTULOGRAM;  Surgeon: Conrad Aristes, MD;  Location: [facility];  Service: Vascular;  Laterality: Left;  . Revision of arteriovenous goretex graft Left 06/04/2015    Procedure: REVISION OF ARTERIOVENOUS  GORETEX GRAFT;  Surgeon: Conrad Madelia, MD;  Location: [facility];  Service: Vascular;  Laterality: Left;       HPI  from the history and physical done on the day of admission:   [name] is a 54 y.o. female with a past medical history of end-stage renal disease on hemodialysis, type 2 diabetes, hypothyroidism, hyperlipidemia, GERD, hypertension, diabetic peripheral neuropathy, anemia renal disease who comes to the ER with complaints of pleuritic chest pain and shortness of breath for 5 days since she had a temporary subclavian hemodialysis catheter placed. She states that the pain and the dyspnea have been progressively worse since then. She states that she has had previous temporary dialysis catheters placed, but this catheter placement was more difficult and painful than usual. She denies fever, chills, typical chest pain, palpitations, dizziness, diaphoresis. She states that the pain is better now after she was medicated earlier.     Hospital Course:     1. Right IJ dialysis catheter site pain and discomfort. X-ray reveals proper placement, no signs of infection externally, catheter was placed by Dr. Augustin Coupe at the nephrology office. Nephrology team has been consulted. Nephrology team discussed her case with vascular surgery who recommended follow-up with Dr. Augustin Coupe at the regional office. Patient much improved and will be discharged home today with follow-up with PCP and nephrology.   2. ESRD. Renal on board for dialysis per schedule.   3. Shortness of breath. Could be mild fluid overload due to underlying ESRD, questionable atelectasis on x-ray, no signs of pneumonia clinically. Afebrile, no white count, we'll monitor 24 hours if stable will stop antibiotic. Fluid removal via dialysis. Shortness of breath has resolved, we'll taper her down to Avelox for a few more days, currently symptom free without any oxygen need. Request PCP to check CBC CMP and a 2 view chest x-ray next visit.     4.  Hypothyroidism. Continue home dose Synthroid.   5. Dyslipidemia. On statin continued.   6. Essential hypertension. Stable on current regimen which includes nifedipine, beta blocker along with clonidine.   7. DM type II. Continue home regimen postdischarge.   8. Incidental finding of lung nodule on CT chest. Outpatient pulmonary follow up within a month. She will need repeat CT scan in 3 months.     Discharge Condition: Stable  Follow UP  Follow-up Information    Follow up with Ricke Hey, MD. Schedule an appointment as soon as possible for a visit in 1 week.   Specialty:  Family Medicine  Contact information:   [address] [address] [phone]       Follow up with [facility], MD. Schedule an appointment as soon as possible for a visit in 1 week.   Specialty:  Pulmonary Disease   Why:  Lung nodule   Contact information:   [place] [id number] [phone]       Follow up with [facility]. Schedule an appointment as soon as possible for a visit in 2 days.   Why:  Right IJ dialysis catheter pain placed at [facility] kidney office   Contact information:   [address] [address] [phone]        Consults obtained - Renal  Diet and Activity recommendation: See Discharge Instructions below  Discharge Instructions       Discharge Instructions    Discharge instructions    Complete by:  As directed   Follow-up in 1-2 weeks with recommended pulmonary physician for lung nodule.  Follow with Primary MD Ricke Hey, MD in 7 days   Get CBC, CMP, 2 view Chest X ray checked  by Primary MD next visit.    Activity: As tolerated with Full fall precautions use walker/cane & assistance as needed   Disposition Home     Diet: Renal-low carbohydrate. Check your Weight same time everyday, if you gain over 2 pounds, or you develop in leg swelling, experience more shortness of breath or chest pain, call  your Primary MD immediately. Follow Cardiac Low Salt Diet and 1.5 lit/day fluid restriction.   On your next visit with your primary care physician please Get Medicines reviewed and adjusted.   Please request your Prim.MD to go over all Hospital Tests and Procedure/Radiological results at the follow up, please get all Hospital records sent to your Prim MD by signing hospital release before you go home.   If you experience worsening of your admission symptoms, develop shortness of breath, life threatening emergency, suicidal or homicidal thoughts you must seek medical attention immediately by calling 911 or calling your MD immediately  if symptoms less severe.  You Must read complete instructions/literature along with all the possible adverse reactions/side effects for all the Medicines you take and that have been prescribed to you. Take any new Medicines after you have completely understood and accpet all the possible adverse reactions/side effects.   Do not drive, operating heavy machinery, perform activities at heights, swimming or participation in water activities or provide baby sitting services if your were admitted for syncope or siezures until you have seen by Primary MD or a Neurologist and advised to do so again.  Do not drive when taking Pain medications.    Do not take more than prescribed Pain, Sleep and Anxiety Medications  Special Instructions: If you have smoked or chewed Tobacco  in the last 2 yrs please stop smoking, stop any regular Alcohol  and or any Recreational drug use.  Wear Seat belts while driving.   Please note  You were cared for by a hospitalist during your hospital stay. If you have any questions about your discharge medications or the care you received while you were in the hospital after you are discharged, you can call the unit and asked to speak with the hospitalist on call if the hospitalist that took care of you is not available. Once you are discharged,  your primary care physician will handle any further medical issues. Please note that NO REFILLS for any discharge medications  will be authorized once you are discharged, as it is imperative that you return to your primary care physician (or establish a relationship with a primary care physician if you do not have one) for your aftercare needs so that they can reassess your need for medications and monitor your lab values.     Increase activity slowly    Complete by:  As directed              Discharge Medications       Medication List    TAKE these medications        aspirin 81 MG chewable tablet  Chew 81 mg by mouth daily.     cinacalcet 60 MG tablet  Commonly known as:  SENSIPAR  Take 60 mg by mouth every Monday, Wednesday, and Friday. Take 1 capsule (60 mg) by mouth after dialysis on Monday, Wednesday, Friday     cloNIDine 0.2 MG tablet  Commonly known as:  CATAPRES  Take 0.2 mg by mouth 2 (two) times daily.     esomeprazole 40 MG capsule  Commonly known as:  NEXIUM  Take 40 mg by mouth daily before breakfast.     HUMALOG MIX 75/25 KWIKPEN (75-25) 100 UNIT/ML Kwikpen  Generic drug:  Insulin Lispro Prot & Lispro  Inject 20-30 Units into the skin 2 (two) times daily with a meal. Inject 30 units subcutaneously with breakfast and 20 units with supper     levothyroxine 125 MCG tablet  Commonly known as:  SYNTHROID, LEVOTHROID  Take 125 mcg by mouth daily before breakfast.     metoprolol succinate 100 MG 24 hr tablet  Commonly known as:  TOPROL-XL  Take 100 mg by mouth daily with supper. Take with or immediately following a meal.     moxifloxacin 400 MG tablet  Commonly known as:  AVELOX  Take 1 tablet (400 mg total) by mouth daily at 8 pm.     NIFEdipine 90 MG 24 hr tablet  Commonly known as:  PROCARDIA XL/ADALAT-CC  Take 90 mg by mouth daily.     oxyCODONE-acetaminophen 10-325 MG per tablet  Commonly known as:  PERCOCET  Take 1 tablet by mouth every 6 (six) hours  as needed for pain.     pravastatin 40 MG tablet  Commonly known as:  PRAVACHOL  Take 40 mg by mouth at bedtime.     pregabalin 75 MG capsule  Commonly known as:  LYRICA  Take 75 mg by mouth 2 (two) times daily.     promethazine 25 MG tablet  Commonly known as:  PHENERGAN  Take 25 mg by mouth every 8 (eight) hours as needed for nausea or vomiting.     sevelamer carbonate 800 MG tablet  Commonly known as:  RENVELA  Take 800-2,400 mg by mouth See admin instructions. Take 3 tablets (2400 mg) by mouth with meals, and 1 tablet (800 mg) with snacks     terbinafine 250 MG tablet  Commonly known as:  LAMISIL  Take 250 mg by mouth daily at 6 PM.        Major procedures and Radiology Reports - PLEASE review detailed and final reports for all details, in brief -       Dg Chest 2 View  06/10/2015   CLINICAL DATA:  Shortness of breath 4 days ago  EXAM: CHEST  2 VIEW  COMPARISON:  04/01/2015  FINDINGS: Cardiomediastinal silhouette is stable. There is dual lumen right IJ catheter with tip in right atrium.  Linear atelectasis or infiltrate noted bilateral lower lobe. No pulmonary edema.  IMPRESSION: Bilateral lower lobe linear atelectasis or infiltrate. No pulmonary edema. Dual lumen right IJ catheter with tip in right atrium.   Electronically Signed   By: Lahoma Crocker M.D.   On: 06/10/2015 16:07   Ct Angio Chest Pe W/cm &/or Wo Cm  06/10/2015   CLINICAL DATA:  54 year old female with shortness of breath. Recent procedure on the left arm with pain in the left arm.  EXAM: CT ANGIOGRAPHY CHEST WITH CONTRAST  TECHNIQUE: Multidetector CT imaging of the chest was performed using the standard protocol during bolus administration of intravenous contrast. Multiplanar CT image reconstructions and MIPs were obtained to evaluate the vascular anatomy.  CONTRAST:  106mL OMNIPAQUE IOHEXOL 350 MG/ML SOLN  COMPARISON:  Radiograph dated 06/09/2015  FINDINGS: There are bibasilar linear atelectasis/ scarring. There is  mild diffuse ground-glass opacity throughout the lungs, likely atelectatic changes. Pneumonia is less likely. There is an 8 mm subpleural nodule in the lingula (series 4, image 50). Follow-up as per Fleischner Society criteria recommended. There is no focal consolidation, pleural effusion, or pneumothorax. The central airways appear patent.  The thoracic aorta appears unremarkable. There is mild dilatation of the main pulmonary trunk indicative of a degree of pulmonary hypertension.  Evaluation of the pulmonary arteries is limited due to respiratory motion artifact as well as suboptimal opacification of the peripheral branches. No definite pulmonary emboli identified. There is no hilar or mediastinal adenopathy. Cardiomegaly. There is coronary vascular calcification. No pericardial effusion. The esophagus is predominantly collapsed. The thyroid gland is not visualized possibly surgically absent. Clinical correlation is recommended.  There is no axillary adenopathy. The chest wall soft tissues appear unremarkable. The osseous structures are intact. There is mild compression of the superior endplate of the [id number] vertebra, likely chronic. No definite acute fracture identified. Clinical correlation is recommended.  There are moderate renal atrophy bilaterally. The visualized upper abdomen otherwise appear unremarkable.  Review of the MIP images confirms the above findings.  IMPRESSION: No CT evidence of pulmonary embolism.  An 8 mm lingular subpleural nodule If the patient is at high risk for bronchogenic carcinoma, follow-up chest CT at 3-53months is recommended. If the patient is at low risk for bronchogenic carcinoma, follow-up chest CT at 6-12 months is recommended. This recommendation follows the consensus statement: Guidelines for Management of Small Pulmonary Nodules Detected on CT Scans: A Statement from the [employer] as published in Radiology 2005; 237:395-400.   Electronically Signed   By: Anner Crete M.D.   On: 06/10/2015 23:42    Micro Results      Recent Results (from the past 240 hour(s))  Blood culture (routine x 2)     Status: None (Preliminary result)   Collection Time: 06/10/15  8:17 PM  Result Value Ref Range Status   Specimen Description BLOOD RIGHT HAND  Final   Special Requests BOTTLES DRAWN AEROBIC AND ANAEROBIC 4CC  Final   Culture NO GROWTH < 24 HOURS  Final   Report Status PENDING  Incomplete  Blood culture (routine x 2)     Status: None (Preliminary result)   Collection Time: 06/10/15  8:20 PM  Result Value Ref Range Status   Specimen Description BLOOD RIGHT FINGER  Final   Special Requests BOTTLES DRAWN AEROBIC ONLY 2CC  Final   Culture NO GROWTH < 24 HOURS  Final   Report Status PENDING  Incomplete  MRSA PCR Screening     Status:  None   Collection Time: 06/11/15  2:39 AM  Result Value Ref Range Status   MRSA by PCR NEGATIVE NEGATIVE Final    Comment:        The GeneXpert MRSA Assay (FDA approved for NASAL specimens only), is one component of a comprehensive MRSA colonization surveillance program. It is not intended to diagnose MRSA infection nor to guide or monitor treatment for MRSA infections.        Today   Subjective    [name] today has no headache,no chest abdominal pain,no new weakness tingling or numbness, feels much better wants to go home today.     Objective   Blood pressure 149/67, pulse 65, temperature 97.6 F (36.4 C), temperature source Oral, resp. rate 16, height 5\' 4"  (1.626 m), weight 96.843 kg (213 lb 8 oz), SpO2 99 %.   Intake/Output Summary (Last 24 hours) at 06/12/15 0940 Last data filed at 06/12/15 0900  Gross per 24 hour  Intake    980 ml  Output   4200 ml  Net  -3220 ml    Exam Awake Alert, Oriented x 3, No new F.N deficits, Normal affect [place].AT,PERRAL Supple Neck,No JVD, No cervical lymphadenopathy appriciated.  Symmetrical Chest wall movement, Good air movement bilaterally, CTAB, right  IJ catheter site appears clean RRR,No Gallops,Rubs or new Murmurs, No Parasternal Heave +ve B.Sounds, Abd Soft, Non tender, No organomegaly appriciated, No rebound -guarding or rigidity. No Cyanosis, Clubbing or edema, No new Rash or bruise   Data Review   CBC w Diff: Lab Results  Component Value Date   WBC 5.3 06/11/2015   HGB 15.0 06/11/2015   HCT 44.0 06/11/2015   PLT 89* 06/11/2015   LYMPHOPCT 35 04/01/2015   MONOPCT 10 04/01/2015   EOSPCT 5 04/01/2015   BASOPCT 1 04/01/2015    CMP: Lab Results  Component Value Date   NA 136 06/11/2015   K 4.2 06/11/2015   CL 96* 06/11/2015   CO2 26 06/11/2015   BUN 28* 06/11/2015   CREATININE 5.10* 06/11/2015   PROT 6.7 06/11/2015   ALBUMIN 3.0* 06/11/2015   BILITOT 0.3 06/11/2015   ALKPHOS 103 06/11/2015   AST 15 06/11/2015   ALT 10* 06/11/2015  .   Total Time in preparing paper work, data evaluation and todays exam - 35 minutes  Thurnell Lose M.D on 06/12/2015 at 9:40 AM  [facility]   Office  [phone]

## 2015-06-15 LAB — CULTURE, BLOOD (ROUTINE X 2)
CULTURE: NO GROWTH
CULTURE: NO GROWTH

## 2015-06-23 ENCOUNTER — Ambulatory Visit: Payer: Medicare Other | Admitting: Cardiology

## 2015-07-11 ENCOUNTER — Encounter: Payer: Medicare Other | Admitting: Vascular Surgery

## 2015-08-18 ENCOUNTER — Telehealth: Payer: Self-pay | Admitting: *Deleted

## 2015-08-18 NOTE — Telephone Encounter (Signed)
Faxed request received for Lyrica 150mg .  Pt has not been seen in office as directed since 2015 and has not had any refills since 2013.  Faxed to [facility] no refills needs an appt.

## 2015-09-15 ENCOUNTER — Telehealth: Payer: Self-pay | Admitting: *Deleted

## 2015-09-15 NOTE — Telephone Encounter (Signed)
Fax request for Lyrica 150mg . Pt has not been seen in our office since 10/09/2013, refills of Lyrica have not been given from [facility].  Faxed note stating pt not seen in over 1 year, refills not given by our office.

## 2015-11-03 ENCOUNTER — Encounter (HOSPITAL_COMMUNITY): Payer: Self-pay | Admitting: *Deleted

## 2015-11-03 ENCOUNTER — Emergency Department (HOSPITAL_COMMUNITY): Payer: Medicare Other

## 2015-11-03 ENCOUNTER — Inpatient Hospital Stay (HOSPITAL_COMMUNITY)
Admission: EM | Admit: 2015-11-03 | Discharge: 2015-11-09 | DRG: 291 | Disposition: A | Discharge status: Home / Self Care | Payer: Medicare Other | Attending: Internal Medicine | Admitting: Internal Medicine

## 2015-11-03 DIAGNOSIS — E872 Acidosis: Secondary | ICD-10-CM | POA: Diagnosis present

## 2015-11-03 DIAGNOSIS — E1142 Type 2 diabetes mellitus with diabetic polyneuropathy: Secondary | ICD-10-CM | POA: Diagnosis present

## 2015-11-03 DIAGNOSIS — E1122 Type 2 diabetes mellitus with diabetic chronic kidney disease: Secondary | ICD-10-CM

## 2015-11-03 DIAGNOSIS — E039 Hypothyroidism, unspecified: Secondary | ICD-10-CM | POA: Diagnosis present

## 2015-11-03 DIAGNOSIS — R0602 Shortness of breath: Secondary | ICD-10-CM

## 2015-11-03 DIAGNOSIS — J9601 Acute respiratory failure with hypoxia: Secondary | ICD-10-CM | POA: Diagnosis present

## 2015-11-03 DIAGNOSIS — Z6838 Body mass index (BMI) 38.0-38.9, adult: Secondary | ICD-10-CM

## 2015-11-03 DIAGNOSIS — Z87891 Personal history of nicotine dependence: Secondary | ICD-10-CM

## 2015-11-03 DIAGNOSIS — D696 Thrombocytopenia, unspecified: Secondary | ICD-10-CM | POA: Diagnosis present

## 2015-11-03 DIAGNOSIS — D631 Anemia in chronic kidney disease: Secondary | ICD-10-CM | POA: Diagnosis present

## 2015-11-03 DIAGNOSIS — E1151 Type 2 diabetes mellitus with diabetic peripheral angiopathy without gangrene: Secondary | ICD-10-CM | POA: Diagnosis present

## 2015-11-03 DIAGNOSIS — I130 Hypertensive heart and chronic kidney disease with heart failure and stage 1 through stage 4 chronic kidney disease, or unspecified chronic kidney disease: Principal | ICD-10-CM | POA: Diagnosis present

## 2015-11-03 DIAGNOSIS — R110 Nausea: Secondary | ICD-10-CM

## 2015-11-03 DIAGNOSIS — E119 Type 2 diabetes mellitus without complications: Secondary | ICD-10-CM

## 2015-11-03 DIAGNOSIS — E1143 Type 2 diabetes mellitus with diabetic autonomic (poly)neuropathy: Secondary | ICD-10-CM | POA: Diagnosis present

## 2015-11-03 DIAGNOSIS — Z7982 Long term (current) use of aspirin: Secondary | ICD-10-CM

## 2015-11-03 DIAGNOSIS — E785 Hyperlipidemia, unspecified: Secondary | ICD-10-CM | POA: Diagnosis present

## 2015-11-03 DIAGNOSIS — R059 Cough, unspecified: Secondary | ICD-10-CM

## 2015-11-03 DIAGNOSIS — K5900 Constipation, unspecified: Secondary | ICD-10-CM | POA: Diagnosis present

## 2015-11-03 DIAGNOSIS — Z8249 Family history of ischemic heart disease and other diseases of the circulatory system: Secondary | ICD-10-CM

## 2015-11-03 DIAGNOSIS — Z794 Long term (current) use of insulin: Secondary | ICD-10-CM

## 2015-11-03 DIAGNOSIS — Z992 Dependence on renal dialysis: Secondary | ICD-10-CM

## 2015-11-03 DIAGNOSIS — I5033 Acute on chronic diastolic (congestive) heart failure: Secondary | ICD-10-CM | POA: Diagnosis present

## 2015-11-03 DIAGNOSIS — N186 End stage renal disease: Secondary | ICD-10-CM

## 2015-11-03 DIAGNOSIS — I160 Hypertensive urgency: Secondary | ICD-10-CM | POA: Diagnosis present

## 2015-11-03 DIAGNOSIS — N2581 Secondary hyperparathyroidism of renal origin: Secondary | ICD-10-CM | POA: Diagnosis present

## 2015-11-03 DIAGNOSIS — I471 Supraventricular tachycardia: Secondary | ICD-10-CM | POA: Diagnosis present

## 2015-11-03 DIAGNOSIS — J189 Pneumonia, unspecified organism: Secondary | ICD-10-CM

## 2015-11-03 DIAGNOSIS — Z8701 Personal history of pneumonia (recurrent): Secondary | ICD-10-CM

## 2015-11-03 DIAGNOSIS — R000 Tachycardia, unspecified: Secondary | ICD-10-CM | POA: Insufficient documentation

## 2015-11-03 DIAGNOSIS — Z833 Family history of diabetes mellitus: Secondary | ICD-10-CM

## 2015-11-03 DIAGNOSIS — R05 Cough: Secondary | ICD-10-CM

## 2015-11-03 DIAGNOSIS — E875 Hyperkalemia: Secondary | ICD-10-CM | POA: Diagnosis present

## 2015-11-03 DIAGNOSIS — K219 Gastro-esophageal reflux disease without esophagitis: Secondary | ICD-10-CM | POA: Diagnosis present

## 2015-11-03 DIAGNOSIS — I10 Essential (primary) hypertension: Secondary | ICD-10-CM | POA: Diagnosis present

## 2015-11-03 DIAGNOSIS — Y95 Nosocomial condition: Secondary | ICD-10-CM | POA: Diagnosis present

## 2015-11-03 DIAGNOSIS — E669 Obesity, unspecified: Secondary | ICD-10-CM | POA: Diagnosis present

## 2015-11-03 LAB — CBC WITH DIFFERENTIAL/PLATELET
BASOS PCT: 0 %
Basophils Absolute: 0 10*3/uL (ref 0.0–0.1)
EOS ABS: 0 10*3/uL (ref 0.0–0.7)
EOS PCT: 1 %
HEMATOCRIT: 43.3 % (ref 36.0–46.0)
Hemoglobin: 13.9 g/dL (ref 12.0–15.0)
Lymphocytes Relative: 18 %
Lymphs Abs: 1.6 10*3/uL (ref 0.7–4.0)
MCH: 24.3 pg — ABNORMAL LOW (ref 26.0–34.0)
MCHC: 32.1 g/dL (ref 30.0–36.0)
MCV: 75.6 fL — ABNORMAL LOW (ref 78.0–100.0)
MONO ABS: 0.4 10*3/uL (ref 0.1–1.0)
MONOS PCT: 5 %
NEUTROS ABS: 6.6 10*3/uL (ref 1.7–7.7)
Neutrophils Relative %: 77 %
PLATELETS: 120 10*3/uL — AB (ref 150–400)
RBC: 5.73 MIL/uL — ABNORMAL HIGH (ref 3.87–5.11)
RDW: 19.5 % — AB (ref 11.5–15.5)
WBC: 8.7 10*3/uL (ref 4.0–10.5)

## 2015-11-03 LAB — COMPREHENSIVE METABOLIC PANEL
ALK PHOS: 207 U/L — AB (ref 38–126)
ALT: 32 U/L (ref 14–54)
ANION GAP: 19 — AB (ref 5–15)
AST: 32 U/L (ref 15–41)
Albumin: 3.6 g/dL (ref 3.5–5.0)
BUN: 60 mg/dL — ABNORMAL HIGH (ref 6–20)
CALCIUM: 8.9 mg/dL (ref 8.9–10.3)
CHLORIDE: 97 mmol/L — AB (ref 101–111)
CO2: 22 mmol/L (ref 22–32)
Creatinine, Ser: 10.44 mg/dL — ABNORMAL HIGH (ref 0.44–1.00)
GFR calc Af Amer: 4 mL/min — ABNORMAL LOW (ref >60)
GFR calc non Af Amer: 4 mL/min — ABNORMAL LOW (ref >60)
Glucose, Bld: 99 mg/dL (ref 65–99)
Potassium: 6.4 mmol/L (ref 3.5–5.1)
SODIUM: 138 mmol/L (ref 135–145)
Total Bilirubin: 0.6 mg/dL (ref 0.3–1.2)
Total Protein: 8.3 g/dL — ABNORMAL HIGH (ref 6.5–8.1)

## 2015-11-03 LAB — I-STAT TROPONIN, ED: Troponin i, poc: 0.02 ng/mL (ref 0.00–0.08)

## 2015-11-03 LAB — BRAIN NATRIURETIC PEPTIDE: B Natriuretic Peptide: 822.1 pg/mL — ABNORMAL HIGH (ref 0.0–100.0)

## 2015-11-03 MED ORDER — INSULIN ASPART 100 UNIT/ML IV SOLN
10.0000 [IU] | Freq: Once | INTRAVENOUS | Status: AC
  Administered 2015-11-04: 10 [IU] via INTRAVENOUS
  Filled 2015-11-03: qty 1

## 2015-11-03 MED ORDER — ALBUTEROL (5 MG/ML) CONTINUOUS INHALATION SOLN
15.0000 mg | INHALATION_SOLUTION | RESPIRATORY_TRACT | Status: DC
  Administered 2015-11-03: 15 mg via RESPIRATORY_TRACT
  Filled 2015-11-03: qty 20

## 2015-11-03 MED ORDER — FUROSEMIDE 10 MG/ML IJ SOLN
160.0000 mg | Freq: Once | INTRAVENOUS | Status: AC
  Administered 2015-11-04: 160 mg via INTRAVENOUS
  Filled 2015-11-03: qty 16

## 2015-11-03 MED ORDER — NITROGLYCERIN IN D5W 200-5 MCG/ML-% IV SOLN
0.0000 ug/min | INTRAVENOUS | Status: DC
  Administered 2015-11-04: 5 ug/min via INTRAVENOUS
  Filled 2015-11-03: qty 250

## 2015-11-03 MED ORDER — DEXTROSE 50 % IV SOLN
1.0000 | Freq: Once | INTRAVENOUS | Status: AC
  Administered 2015-11-04: 50 mL via INTRAVENOUS
  Filled 2015-11-03: qty 50

## 2015-11-03 MED ORDER — SODIUM POLYSTYRENE SULFONATE 15 GM/60ML PO SUSP
30.0000 g | Freq: Once | ORAL | Status: AC
  Administered 2015-11-04: 30 g via ORAL
  Filled 2015-11-03: qty 120

## 2015-11-03 MED ORDER — CLONIDINE HCL 0.2 MG PO TABS
0.2000 mg | ORAL_TABLET | Freq: Once | ORAL | Status: AC
  Administered 2015-11-03: 0.2 mg via ORAL
  Filled 2015-11-03: qty 1

## 2015-11-03 MED ORDER — METOPROLOL TARTRATE 25 MG PO TABS
100.0000 mg | ORAL_TABLET | Freq: Once | ORAL | Status: AC
  Administered 2015-11-03: 100 mg via ORAL
  Filled 2015-11-03: qty 4

## 2015-11-03 MED ORDER — ACETAMINOPHEN 500 MG PO TABS
1000.0000 mg | ORAL_TABLET | Freq: Once | ORAL | Status: AC
  Administered 2015-11-03: 1000 mg via ORAL
  Filled 2015-11-03: qty 2

## 2015-11-03 MED ORDER — SODIUM CHLORIDE 0.9 % IV SOLN
1.0000 g | Freq: Once | INTRAVENOUS | Status: AC
  Administered 2015-11-04: 1 g via INTRAVENOUS
  Filled 2015-11-03: qty 10

## 2015-11-03 MED ORDER — ALBUTEROL SULFATE (2.5 MG/3ML) 0.083% IN NEBU
5.0000 mg | INHALATION_SOLUTION | RESPIRATORY_TRACT | Status: DC
  Administered 2015-11-03: 5 mg via RESPIRATORY_TRACT
  Filled 2015-11-03: qty 6

## 2015-11-03 MED ORDER — ALBUTEROL SULFATE (2.5 MG/3ML) 0.083% IN NEBU
2.5000 mg | INHALATION_SOLUTION | Freq: Once | RESPIRATORY_TRACT | Status: AC
  Administered 2015-11-03: 2.5 mg via RESPIRATORY_TRACT
  Filled 2015-11-03: qty 3

## 2015-11-03 MED ORDER — NITROGLYCERIN 0.4 MG SL SUBL
0.4000 mg | SUBLINGUAL_TABLET | SUBLINGUAL | Status: DC | PRN
  Administered 2015-11-03: 0.4 mg via SUBLINGUAL
  Filled 2015-11-03: qty 1

## 2015-11-03 NOTE — ED Notes (Signed)
IV team at bedside 

## 2015-11-03 NOTE — ED Notes (Signed)
Multiple attempts by RN and phlebotomy to obtain lab work, IV team paged

## 2015-11-03 NOTE — ED Notes (Signed)
RT notified of need for bipap

## 2015-11-03 NOTE — ED Notes (Signed)
Pt states she has not taken her home BP medication, MD notified, orders recieved

## 2015-11-03 NOTE — ED Notes (Signed)
Pt tolerating bipap well, appears more comfortable

## 2015-11-03 NOTE — ED Notes (Signed)
MD Knapp at bedside 

## 2015-11-03 NOTE — ED Notes (Signed)
RT at bedside.

## 2015-11-03 NOTE — ED Provider Notes (Signed)
CSN: [id number]     Arrival date & time 11/03/15  1631 History   First MD Initiated Contact with Patient 11/03/15 1639     Chief Complaint  Patient presents with  . Shortness of Breath   Patient is a 55 y.o. female presenting with cough. The history is provided by the patient. No language interpreter was used.  Cough Cough characteristics:  Productive Sputum characteristics:  Nondescript Severity:  Moderate Onset quality:  Gradual Duration:  2 days Timing:  Constant Progression:  Worsening Chronicity:  Recurrent Smoker: no   Context: sick contacts   Relieved by:  None tried Worsened by:  Nothing tried Associated symptoms: shortness of breath and sinus congestion   Associated symptoms: no chest pain, no chills, no fever, no rash, no sore throat and no wheezing     Past Medical History  Diagnosis Date  . Diabetes mellitus, type II, insulin dependent ([place])   . Paroxysmal supraventricular tachycardia ([place])   . End stage renal disease on dialysis ([place])     Hemodialysis T, TH, Sat  . Hypothyroidism (acquired)   . Essential hypertension     Difficult control. On multiple medications.  . Diabetic peripheral neuropathy associated with type 2 diabetes mellitus ([place])   . Peripheral arterial disease [facility]) December 2014    @ [place]: a) RSFA PTA (01/2013); b) L SFA [place] Atherectomy/PTA with 3V runoff; LEA Dopplers January 2016 [facility] post L SFA PTA): Mild, insignificant disease in the left CFA, profunda, SFA, popliteal artery and tibioperoneal trunk.  . 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 Hemodialysis patient ([place])   . Hyperlipidemia   . Sickle cell trait ([place])   . Gastroparesis due to DM ([place])   . GERD (gastroesophageal reflux disease)   . Heart murmur   . Arthritis    Past Surgical History  Procedure Laterality Date  . Abdominal hysterectomy    . Knee surgery      right  . Thyroid surgery    .  Thyroidectomy    . Knee arthroplasty    . Cataract extraction, bilateral    . Colonoscopy  07/19/2012    Procedure: COLONOSCOPY;  Surgeon: Beryle Beams, MD;  Location: [place];  Service: Endoscopy;  Laterality: N/A;  . Arteriovenous graft placement Left 2012    Dr. Harrington Challenger  2nd graft  . Shuntogram Left 02/14/2013    Procedure: FISTULOGRAM;  Surgeon: Serafina Mitchell, MD;  Location: [facility] CATH LAB;  Service: Cardiovascular;  Laterality: Left;  . Nm myoview ltd  September 2009; 01/15/2015    a) @ [facility] - Negative for inducible or reversible ischemia with pharmacologic stress. EF 63% ; b) [facility]: EF 68%. No RWMA, negative S4 reversible coronary ischemia or infarction.  . Carotid ultrasound   May 2011    Normal [place]  . Transthoracic echocardiogram  October 2013; January 2015    a) [name]) 2013: normal LV size and function. Mild LVH. EF 55-60%. no RWMA, No valve disease; b) [facility] 1/'15: EF 70-75%, mild MR. Normal diastolic dysfunction  . Peripheral vascular catheterization  May 2014; December 2014    a) RSFA & PopA mod-severe disease -- R SFA PTA, Med Rx of L SFA; b) distal abdominal and iliac & CFA arteries patent.  m-d LSFA long 60%, LPop 60% -> 3 V runoff. RSFA ~40% mid, RPop 60%, 3V runoff  . Peripheral angioplasty  May 2014  PTA of R SFA-PopA.  Mar[facility] Kitchen Peripheral athrectomy  December 2014    [place] Atherectomy of L SFA-L PopA reduction of 60% to 10% stenosis   . Fistulogram Left 06/04/2015    Procedure: FISTULOGRAM;  Surgeon: Conrad Capon Bridge, MD;  Location: [facility] OR;  Service: Vascular;  Laterality: Left;  . Revision of arteriovenous goretex graft Left 06/04/2015    Procedure: REVISION OF ARTERIOVENOUS GORETEX GRAFT;  Surgeon: Conrad Quail Ridge, MD;  Location: [place];  Service: Vascular;  Laterality: Left;   Family History  Problem Relation Age of Onset  . Diabetes Mother   . Hypertension Mother   . Heart attack Mother   . Diabetes Father    . Hypertension Father    Social History  Substance Use Topics  . Smoking status: Former Smoker    Types: Cigarettes    Quit date: 09/20/1993  . Smokeless tobacco: Never Used  . Alcohol Use: No   OB History    Gravida Para Term Preterm AB TAB SAB Ectopic Multiple Living            3     Review of Systems  Constitutional: Negative for fever and chills.  HENT: Positive for congestion. Negative for sore throat.   Respiratory: Positive for cough and shortness of breath. Negative for wheezing.   Cardiovascular: Negative for chest pain.  Gastrointestinal: Negative for abdominal pain, diarrhea and constipation.  Endocrine: Negative for polydipsia and polyuria.  Genitourinary: Negative for dysuria and difficulty urinating.  Musculoskeletal: Negative for back pain, joint swelling and arthralgias.  Skin: Negative for rash and wound.  Neurological: Negative for dizziness, facial asymmetry and light-headedness.  Psychiatric/Behavioral: Negative for confusion and agitation.  All other systems reviewed and are negative.   Allergies  Review of patient's allergies indicates no known allergies.  Home Medications   Prior to Admission medications   Medication Sig Start Date End Date Taking? Authorizing Provider  aspirin 81 MG chewable tablet Chew 81 mg by mouth daily.   Yes Historical Provider, MD  cloNIDine (CATAPRES) 0.2 MG tablet Take 0.2 mg by mouth 2 (two) times daily.    Yes Historical Provider, MD  esomeprazole (NEXIUM) 40 MG capsule Take 40 mg by mouth daily before breakfast.   Yes Historical Provider, MD  Insulin Lispro Prot & Lispro (HUMALOG MIX 75/25 KWIKPEN) (75-25) 100 UNIT/ML Kwikpen Inject 20-30 Units into the skin 2 (two) times daily with a meal. Inject 30 units subcutaneously with breakfast and 20 units with supper   Yes Historical Provider, MD  levothyroxine (SYNTHROID, LEVOTHROID) 125 MCG tablet Take 125 mcg by mouth daily before breakfast.    Yes Historical Provider, MD   metoprolol succinate (TOPROL-XL) 100 MG 24 hr tablet Take 100 mg by mouth daily with supper. Take with or immediately following a meal.   Yes Historical Provider, MD  NIFEdipine (PROCARDIA-XL/ADALAT CC) 60 MG 24 hr tablet Take 60 mg by mouth 2 (two) times daily. 10/16/15  Yes Historical Provider, MD  oxyCODONE-acetaminophen (PERCOCET) 10-325 MG per tablet Take 1 tablet by mouth every 6 (six) hours as needed for pain. 06/04/15  Yes Samantha J Rhyne, PA-C  pravastatin (PRAVACHOL) 40 MG tablet Take 40 mg by mouth at bedtime.    Yes Historical Provider, MD  pregabalin (LYRICA) 75 MG capsule Take 75 mg by mouth 2 (two) times daily.   Yes Historical Provider, MD  promethazine (PHENERGAN) 25 MG tablet Take 25 mg by mouth every 8 (eight) hours as needed for nausea or vomiting.  Yes Historical Provider, MD  sevelamer carbonate (RENVELA) 800 MG tablet Take 800-1,600 mg by mouth 3 (three) times daily with meals. Take 2 tablets (1600 mg) by mouth with meals, and 1 tablet (800 mg) with snacks   Yes Historical Provider, MD   BP 186/74 mmHg  Pulse 89  Temp(Src) 98.9 F (37.2 C) (Oral)  Resp 26  Wt 97.523 kg  SpO2 100% Physical Exam  Constitutional: She is oriented to person, place, and time. She appears well-developed and well-nourished. No distress.  HENT:  Head: Normocephalic and atraumatic.  Eyes: Pupils are equal, round, and reactive to light.  Neck: Normal range of motion. Neck supple. No tracheal deviation present. No thyromegaly present.  Cardiovascular: Normal rate, regular rhythm, normal heart sounds and intact distal pulses.  Exam reveals no friction rub.   No murmur heard. Pulmonary/Chest: No respiratory distress. She has wheezes. She has no rales. She exhibits no tenderness.  Tachypnea  Abdominal: Soft. Bowel sounds are normal. She exhibits no distension. There is no tenderness.  Musculoskeletal: Normal range of motion. She exhibits no edema or tenderness.  Neurological: She is alert and  oriented to person, place, and time. No cranial nerve deficit.  Skin: Skin is warm and dry. She is not diaphoretic.  Psychiatric: She has a normal mood and affect. Her behavior is normal.  Nursing note and vitals reviewed.   ED Course  Procedures (including critical care time) Labs Review Labs Reviewed  COMPREHENSIVE METABOLIC PANEL - Abnormal; Notable for the following:    Potassium 6.4 (*)    Chloride 97 (*)    BUN 60 (*)    Creatinine, Ser 10.44 (*)    Total Protein 8.3 (*)    Alkaline Phosphatase 207 (*)    GFR calc non Af Amer 4 (*)    GFR calc Af Amer 4 (*)    Anion gap 19 (*)    All other components within normal limits  CBC WITH DIFFERENTIAL/PLATELET - Abnormal; Notable for the following:    RBC 5.73 (*)    MCV 75.6 (*)    MCH 24.3 (*)    RDW 19.5 (*)    Platelets 120 (*)    All other components within normal limits  BRAIN NATRIURETIC PEPTIDE - Abnormal; Notable for the following:    B Natriuretic Peptide 822.1 (*)    All other components within normal limits  BLOOD GAS, ARTERIAL  INFLUENZA PANEL BY PCR (TYPE A & B, H1N1)  I-STAT TROPOININ, ED    Imaging Review Dg Chest Portable 1 View  11/03/2015  CLINICAL DATA:  55 year old female with persistent progressive productive cough and sudden onset shortness of breath despite being on antibiotics for recent diagnosis of pneumonia EXAM: PORTABLE CHEST 1 VIEW COMPARISON:  Prior chest x-ray 06/10/15 FINDINGS: Stable cardiac and mediastinal contours. Diffuse bilateral interstitial and airspace opacities significantly increased compared to prior imaging. No pneumothorax or definite large pleural effusion. Long segment stent overlies the left shoulder girdle. This is likely within the venous system and related to an underlying hemodialysis access. No acute osseous abnormality. IMPRESSION: Mild cardiomegaly with diffuse bilateral interstitial and airspace opacities. The imaging appearance is most suggestive of moderate CHF versus  volume overload. Additional differential considerations include atypical or viral pneumonia, less likely multi lobar pneumonia or ARDS. Electronically Signed   By: Jacqulynn Cadet M.D.   On: 11/03/2015 17:10   I have personally reviewed and evaluated these images and lab results as part of my medical decision-making.  EKG Interpretation   Date/Time:  Monday November 03 2015 16:34:31 EST Ventricular Rate:  94 PR Interval:  157 QRS Duration: 80 QT Interval:  352 QTC Calculation: 440 R Axis:   53 Text Interpretation:  Sinus rhythm Anterior infarct, old No significant  change since last tracing Confirmed by KNAPP  MD-J, JON [id number]) on  11/03/2015 4:37:29 PM      MDM   Final diagnoses:  Hyperkalemia  Shortness of breath  Cough    Patient is a 55 year old female with history of type 2 diabetes, end-stage renal disease on dialysis who presents to the emergency department for evaluation of 36 hours of cough and shortness of breath. She has not missed any dialysis episodes. Patient endorses nonproductive cough. She denies any chest pain.  On arrival, patient afebrile with respiratory rate of 30. Hypertensive 204/76. Patient given home blood pressure medications. She was given nebulizer treatment.  Differential diagnosis includes volume overload vs. Pneumonia vs. URI.    Chest x-ray consistent with volume overload.  BNP elevated at 822. CBC unremarkable. CMP with elevated potassium of 6.4, creatinine 10.44. EKG without peak T waves.  Patient given calcium gluconate, insulin/glucose, albuterol treatment.  Patient with persistent tachypnea and increased work of breathing so was tried on BIPAP.  She did well with BiPAP and was admitted to stepdown unit for treatment of hyperkalemia and fluid overload with plans to dialyze urgently.  Patient updated on plan of care admit to stepdown unit in stable condition with no further ED events.  Discussed case and my attending Dr.  Tomi Bamberger.  Vira Blanco, MD 11/03/15 2340  Dorie Rank, MD 11/04/15 [phone]

## 2015-11-03 NOTE — ED Notes (Signed)
Pt in via EMS from home, pt c/o cough and congestion for the last month, continued since release from the hospital a month ago and was receiving treatment for pneumonia, states last night she started feeling worse, today c/o increased shortness of breath and cough, cough is productive with brown sputum, pt dyspneic on arrival, breathing treatment from EMS in process

## 2015-11-03 NOTE — ED Notes (Signed)
Phlebotomy at bedside.

## 2015-11-03 NOTE — H&P (Signed)
History and Physical  Patient Name: [name]     [id number]    DOB: 01-23-1961    DOA: 11/03/2015 Referring physician: Vira Blanco, MD PCP: Ricke Hey, MD      Chief Complaint: Dyspnea  HPI: [name] is a 55 y.o. female with a past medical history significant for ESRD on HD TThS, IDDM, hypothyroidism, and HTN who presents with dyspnea.  The patient was in her usual state of health until about one month ago when she developed a cough and fever, was admitted to the hospital with pneumonia and intubated briefly in [place].  She recovered well, was discharged home on antibiotics which she completed, and has been doing well since then with only a minor cough, resolving. She has been dialyzing regularly on schedule.  This weekend she came up from [place] to see family. Saturday and Sunday she was feeling completely back to her normal self, until Sunday night when she  started to have malaise, cough, increase and brown sputum, and nausea, and a restless night. In the morning she felt better until the afternoon when she picked the kids up from school, and while driving to pick up her granddaughter her breathing got worse to the point that she "couldn't catch any air", was coughing, had brown sputum, and felt malaise so she asked a bystander to call 9-1-1.   At no point this weekend she had fever or chills.  No known sick contacts.  In the ED, she was afebrile but tachycardic and tachypneic. She was hypertensive and required supplemental oxygen. K6.4, HCO3 22, creatinine 10.4, anion gap 19, WBC 8.7, BNP 822, TNI normal. Chest x-ray showed bilateral airspace opacities, consistent with pulmonary edema.  An ECG showed tachycardia without ST changes.  Insulin and glucose and calcium were administered.  TRH were asked to evaluate for admission.      Review of Systems:  Pt complains of dyspnea, cough, brown sputum, malaise, nausea. Pt denies any fever, chills, rigors,  decreased urine.  All other systems negative except as just noted or noted in the history of present illness.  No Known Allergies  Prior to Admission medications   Medication Sig Start Date End Date Taking? Authorizing Provider  aspirin 81 MG chewable tablet Chew 81 mg by mouth daily.   Yes Historical Provider, MD  cloNIDine (CATAPRES) 0.2 MG tablet Take 0.2 mg by mouth 2 (two) times daily.    Yes Historical Provider, MD  esomeprazole (NEXIUM) 40 MG capsule Take 40 mg by mouth daily before breakfast.   Yes Historical Provider, MD  Insulin Lispro Prot & Lispro (HUMALOG MIX 75/25 KWIKPEN) (75-25) 100 UNIT/ML Kwikpen Inject 20-30 Units into the skin 2 (two) times daily with a meal. Inject 30 units subcutaneously with breakfast and 20 units with supper   Yes Historical Provider, MD  levothyroxine (SYNTHROID, LEVOTHROID) 125 MCG tablet Take 125 mcg by mouth daily before breakfast.    Yes Historical Provider, MD  metoprolol succinate (TOPROL-XL) 100 MG 24 hr tablet Take 100 mg by mouth daily with supper. Take with or immediately following a meal.   Yes Historical Provider, MD  NIFEdipine (PROCARDIA-XL/ADALAT CC) 60 MG 24 hr tablet Take 60 mg by mouth 2 (two) times daily. 10/16/15  Yes Historical Provider, MD  oxyCODONE-acetaminophen (PERCOCET) 10-325 MG per tablet Take 1 tablet by mouth every 6 (six) hours as needed for pain. 06/04/15  Yes Samantha J Rhyne, PA-C  pravastatin (PRAVACHOL) 40 MG tablet Take 40 mg by mouth at  bedtime.    Yes Historical Provider, MD  pregabalin (LYRICA) 75 MG capsule Take 75 mg by mouth 2 (two) times daily.   Yes Historical Provider, MD  promethazine (PHENERGAN) 25 MG tablet Take 25 mg by mouth every 8 (eight) hours as needed for nausea or vomiting.    Yes Historical Provider, MD  sevelamer carbonate (RENVELA) 800 MG tablet Take 800-1,600 mg by mouth 3 (three) times daily with meals. Take 2 tablets (1600 mg) by mouth with meals, and 1 tablet (800 mg) with snacks   Yes Historical  Provider, MD  moxifloxacin (AVELOX) 400 MG tablet Take 1 tablet (400 mg total) by mouth daily at 8 pm. Patient not taking: Reported on 11/03/2015 06/12/15   Thurnell Lose, MD    Past Medical History  Diagnosis Date  . Diabetes mellitus, type II, insulin dependent ([place])   . Paroxysmal supraventricular tachycardia ([place])   . End stage renal disease on dialysis ([place])     Hemodialysis T, TH, Sat  . Hypothyroidism (acquired)   . Essential hypertension     Difficult control. On multiple medications.  . Diabetic peripheral neuropathy associated with type 2 diabetes mellitus ([place])   . Peripheral arterial disease [facility]) December 2014    @ [place]: a) RSFA PTA (01/2013); b) L SFA [place] Atherectomy/PTA with 3V runoff; LEA Dopplers January 2016 [facility] post L SFA PTA): Mild, insignificant disease in the left CFA, profunda, SFA, popliteal artery and tibioperoneal trunk.  . 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 Hemodialysis patient ([place])   . Hyperlipidemia   . Sickle cell trait ([place])   . Gastroparesis due to DM ([place])   . GERD (gastroesophageal reflux disease)   . Heart murmur   . Arthritis     Past Surgical History  Procedure Laterality Date  . Abdominal hysterectomy    . Knee surgery      right  . Thyroid surgery    . Thyroidectomy    . Knee arthroplasty    . Cataract extraction, bilateral    . Colonoscopy  07/19/2012    Procedure: COLONOSCOPY;  Surgeon: Beryle Beams, MD;  Location: [place];  Service: Endoscopy;  Laterality: N/A;  . Arteriovenous graft placement Left 2012    Dr. Harrington Challenger  2nd graft  . Shuntogram Left 02/14/2013    Procedure: FISTULOGRAM;  Surgeon: Serafina Mitchell, MD;  Location: [facility] CATH LAB;  Service: Cardiovascular;  Laterality: Left;  . Nm myoview ltd  September 2009; 01/15/2015    a) @ [facility] - Negative for inducible or reversible ischemia with pharmacologic stress. EF 63% ; b) [facility]: EF  68%. No RWMA, negative S4 reversible coronary ischemia or infarction.  . Carotid ultrasound   May 2011    Normal ([place], [place])  . Transthoracic echocardiogram  October 2013; January 2015    a) [name]) 2013: normal LV size and function. Mild LVH. EF 55-60%. no RWMA, No valve disease; b) [facility] 1/'15: EF 70-75%, mild MR. Normal diastolic dysfunction  . Peripheral vascular catheterization  May 2014; December 2014    a) RSFA & PopA mod-severe disease -- R SFA PTA, Med Rx of L SFA; b) distal abdominal and iliac & CFA arteries patent.  m-d LSFA long 60%, LPop 60% -> 3 V runoff. RSFA ~40% mid, RPop 60%, 3V runoff  . Peripheral angioplasty  May 2014    PTA of R SFA-PopA.  [facility] Peripheral athrectomy  December 2014    [place] Atherectomy of L SFA-L PopA reduction of 60% to 10% stenosis   . Fistulogram Left 06/04/2015    Procedure: FISTULOGRAM;  Surgeon: Conrad Rising Star, MD;  Location: [facility] OR;  Service: Vascular;  Laterality: Left;  . Revision of arteriovenous goretex graft Left 06/04/2015    Procedure: REVISION OF ARTERIOVENOUS GORETEX GRAFT;  Surgeon: Conrad Clayton, MD;  Location: [facility] OR;  Service: Vascular;  Laterality: Left;    Family history: family history includes Diabetes in her father and mother; Heart attack in her mother; Hypertension in her father and mother.  Social History: Patient lives by herself in [place].  She lived in [place] until four months ago.  She is a former smoker.  She is able to drive and is independent with all ADLs and IADLs.       Physical Exam: BP 186/74 mmHg  Pulse 89  Temp(Src) 98.9 F (37.2 C) (Oral)  Resp 26  Wt 97.523 kg (215 lb)  SpO2 100% General appearance: Well-developed, obese adult female, alert and in moderate distress from dyspnea.  Speaks in short sentences off BiPAP.   Eyes: Anicteric, conjunctiva pink, lids and lashes normal.     ENT: No nasal deformity, discharge, or epistaxis.  OP moist without lesions.   No posterior erythema. Lymph: No cervical or supraclavicular lymphadenopathy. Skin: Warm and dry.   Cardiac: Tachycardic, regular, nl S1-S2, no murmurs appreciated.  Capillary refill is brisk.  No JVD.  Mild bilateral non-pitting LE edema.  Radial and DP pulses 2+ and symmetric. Respiratory: Tachypnea, short of breath.  There are coarse bilateral airway sounds in all fields.  No wheezes. Abdomen: Abdomen soft without rigidity.  No TTP. No ascites, distension.   MSK: No deformities or effusions. Neuro: Sensorium intact and responding to questions, attention normal.  Speech is fluent.  Moves all extremities equally and with normal coordination.    Psych: Behavior appropriate.  Affect normal.  No evidence of aural or visual hallucinations or delusions.       Labs on Admission:  The metabolic panel shows hyperkalemia, normal bicarbonate, elevated creatinine, elevated anion gap. Glucose normal. Alkaline phosphatase is minimally elevated, transaminases and bilirubin are normal. The BNP is 822 pg per mL. The troponin is normal.  The complete blood count shows no leukocytosis or anemia.  Mild thrombocytopenia.   Radiological Exams on Admission: Personally reviewed: Dg Chest Portable 1 View  11/03/2015  CLINICAL DATA:  55 year old female with persistent progressive productive cough and sudden onset shortness of breath despite being on antibiotics for recent diagnosis of pneumonia EXAM: PORTABLE CHEST 1 VIEW COMPARISON:  Prior chest x-ray 06/10/15 FINDINGS: Stable cardiac and mediastinal contours. Diffuse bilateral interstitial and airspace opacities significantly increased compared to prior imaging. No pneumothorax or definite large pleural effusion. Long segment stent overlies the left shoulder girdle. This is likely within the venous system and related to an underlying hemodialysis access. No acute osseous abnormality. IMPRESSION: Mild cardiomegaly with diffuse bilateral interstitial and airspace  opacities. The imaging appearance is most suggestive of moderate CHF versus volume overload. Additional differential considerations include atypical or viral pneumonia, less likely multi lobar pneumonia or ARDS. Electronically Signed   By: Jacqulynn Cadet M.D.   On: 11/03/2015 17:10    EKG: Independently reviewed. Rate 94, QTC 440. Sinus, without ST changes.     Assessment/Plan 1. Acute hypoxic respiratory failure, suspect from fluid overload:  This is new.  The patient has acute onset dyspnea with bilateral pulmonary opacities  and no evidence of infection, no previous history of COPD.  Viral pneumonia is considered, but sepsis syndrome is doubted.  PE is doubted given infiltrates on CXR.   Acute CHF from fluid overload/ESRD is suspected.   -Admit to stepdown -Check ABG -Check lactic acid -Continue BiPAP -Consult to nephrology, appreciate cares -Trend CBC and culture blood/start antibiotics if fever or new leukocytosis -Nebulized albuterol PRN -Check echocardiogram and cycle troponins   2. Hyperkalemia:  Discussed with Nephrology, who recommended first shift dialysis -Furosemide 160 mg IV once -Kayexalate 30g oral once  -Consult to Nephrology, appreciate cares  3. ESRD on HD:  -Consult to Nephrology, appreciate cares  4. HTN with hypertensive urgency:  -Nitro drip -Continue home clonidine, BB, nifedipine, aspirin and statin -Furosemide as above  5. IDDM:  -Hold patient's 75/25 mix while inpatient -Glargine 10 units daily -Sliding scale corrections, low dose  6. Acidemia:  Suspect uremia.  Glucose normal, bicarb near normal -Will check lactic acid and ABG  7. Hypothyroidism:  -Continue levothyroxine  8. Neuropathy: -Continue home pregabalin and oxycodone PRN    DVT PPx: Heparin Diet: Renal Consultants: Nephrology Code Status: FULL Family Communication: None  Medical decision making: What exists of the patient's previous chart was reviewed in depth and the case  was discussed with Dr. Adela Glimpse and Dr. Augustin Coupe of Nephrology. Patient seen 10:42 PM on 11/03/2015.  Disposition Plan:  I recommend admission to stepdown.  Clinical condition: requiring NIV support.  Anticipate dialysis within next 24 hours, and monitoring for fever.  If improved with dialysis and no clinical worsening, consider discharge within 24-48 hours.      Edwin Dada [facility] Pager [phone]

## 2015-11-03 NOTE — ED Notes (Signed)
MD aware of lab delay

## 2015-11-03 NOTE — ED Notes (Signed)
Another phlebotomist at bedside attempting lab work

## 2015-11-04 ENCOUNTER — Observation Stay (HOSPITAL_COMMUNITY): Payer: Medicare Other

## 2015-11-04 DIAGNOSIS — Z992 Dependence on renal dialysis: Secondary | ICD-10-CM | POA: Diagnosis not present

## 2015-11-04 DIAGNOSIS — I160 Hypertensive urgency: Secondary | ICD-10-CM | POA: Diagnosis present

## 2015-11-04 DIAGNOSIS — I130 Hypertensive heart and chronic kidney disease with heart failure and stage 1 through stage 4 chronic kidney disease, or unspecified chronic kidney disease: Secondary | ICD-10-CM | POA: Diagnosis present

## 2015-11-04 DIAGNOSIS — K5900 Constipation, unspecified: Secondary | ICD-10-CM | POA: Diagnosis present

## 2015-11-04 DIAGNOSIS — I471 Supraventricular tachycardia: Secondary | ICD-10-CM | POA: Diagnosis present

## 2015-11-04 DIAGNOSIS — Z794 Long term (current) use of insulin: Secondary | ICD-10-CM | POA: Diagnosis not present

## 2015-11-04 DIAGNOSIS — E872 Acidosis: Secondary | ICD-10-CM | POA: Diagnosis present

## 2015-11-04 DIAGNOSIS — K219 Gastro-esophageal reflux disease without esophagitis: Secondary | ICD-10-CM | POA: Diagnosis present

## 2015-11-04 DIAGNOSIS — D696 Thrombocytopenia, unspecified: Secondary | ICD-10-CM | POA: Diagnosis present

## 2015-11-04 DIAGNOSIS — Z87891 Personal history of nicotine dependence: Secondary | ICD-10-CM | POA: Diagnosis not present

## 2015-11-04 DIAGNOSIS — E1151 Type 2 diabetes mellitus with diabetic peripheral angiopathy without gangrene: Secondary | ICD-10-CM | POA: Diagnosis present

## 2015-11-04 DIAGNOSIS — Z833 Family history of diabetes mellitus: Secondary | ICD-10-CM | POA: Diagnosis not present

## 2015-11-04 DIAGNOSIS — J189 Pneumonia, unspecified organism: Secondary | ICD-10-CM | POA: Diagnosis present

## 2015-11-04 DIAGNOSIS — I5033 Acute on chronic diastolic (congestive) heart failure: Secondary | ICD-10-CM | POA: Diagnosis present

## 2015-11-04 DIAGNOSIS — Z7982 Long term (current) use of aspirin: Secondary | ICD-10-CM | POA: Diagnosis not present

## 2015-11-04 DIAGNOSIS — Z6838 Body mass index (BMI) 38.0-38.9, adult: Secondary | ICD-10-CM | POA: Diagnosis not present

## 2015-11-04 DIAGNOSIS — E119 Type 2 diabetes mellitus without complications: Secondary | ICD-10-CM | POA: Diagnosis not present

## 2015-11-04 DIAGNOSIS — I10 Essential (primary) hypertension: Secondary | ICD-10-CM | POA: Diagnosis not present

## 2015-11-04 DIAGNOSIS — Z8249 Family history of ischemic heart disease and other diseases of the circulatory system: Secondary | ICD-10-CM | POA: Diagnosis not present

## 2015-11-04 DIAGNOSIS — D631 Anemia in chronic kidney disease: Secondary | ICD-10-CM | POA: Diagnosis present

## 2015-11-04 DIAGNOSIS — R0602 Shortness of breath: Secondary | ICD-10-CM | POA: Diagnosis present

## 2015-11-04 DIAGNOSIS — E1142 Type 2 diabetes mellitus with diabetic polyneuropathy: Secondary | ICD-10-CM | POA: Diagnosis present

## 2015-11-04 DIAGNOSIS — I509 Heart failure, unspecified: Secondary | ICD-10-CM | POA: Diagnosis not present

## 2015-11-04 DIAGNOSIS — E039 Hypothyroidism, unspecified: Secondary | ICD-10-CM | POA: Diagnosis present

## 2015-11-04 DIAGNOSIS — N2581 Secondary hyperparathyroidism of renal origin: Secondary | ICD-10-CM | POA: Diagnosis present

## 2015-11-04 DIAGNOSIS — E1122 Type 2 diabetes mellitus with diabetic chronic kidney disease: Secondary | ICD-10-CM | POA: Diagnosis present

## 2015-11-04 DIAGNOSIS — Y95 Nosocomial condition: Secondary | ICD-10-CM | POA: Diagnosis present

## 2015-11-04 DIAGNOSIS — Z8701 Personal history of pneumonia (recurrent): Secondary | ICD-10-CM | POA: Diagnosis not present

## 2015-11-04 DIAGNOSIS — E875 Hyperkalemia: Secondary | ICD-10-CM | POA: Diagnosis present

## 2015-11-04 DIAGNOSIS — J9601 Acute respiratory failure with hypoxia: Secondary | ICD-10-CM | POA: Diagnosis present

## 2015-11-04 DIAGNOSIS — E1143 Type 2 diabetes mellitus with diabetic autonomic (poly)neuropathy: Secondary | ICD-10-CM | POA: Diagnosis present

## 2015-11-04 DIAGNOSIS — E669 Obesity, unspecified: Secondary | ICD-10-CM | POA: Diagnosis present

## 2015-11-04 DIAGNOSIS — N186 End stage renal disease: Secondary | ICD-10-CM | POA: Diagnosis present

## 2015-11-04 DIAGNOSIS — R000 Tachycardia, unspecified: Secondary | ICD-10-CM | POA: Diagnosis not present

## 2015-11-04 DIAGNOSIS — E785 Hyperlipidemia, unspecified: Secondary | ICD-10-CM | POA: Diagnosis present

## 2015-11-04 LAB — I-STAT ARTERIAL BLOOD GAS, ED
ACID-BASE DEFICIT: 4 mmol/L — AB (ref 0.0–2.0)
BICARBONATE: 22.2 meq/L (ref 20.0–24.0)
O2 SAT: 93 %
PO2 ART: 71 mmHg — AB (ref 80.0–100.0)
Patient temperature: 98.7
TCO2: 23 mmol/L (ref 0–100)
pCO2 arterial: 42.1 mmHg (ref 35.0–45.0)
pH, Arterial: 7.33 — ABNORMAL LOW (ref 7.350–7.450)

## 2015-11-04 LAB — CBC
HCT: 36 % (ref 36.0–46.0)
HEMOGLOBIN: 11.6 g/dL — AB (ref 12.0–15.0)
MCH: 24.1 pg — AB (ref 26.0–34.0)
MCHC: 32.2 g/dL (ref 30.0–36.0)
MCV: 74.8 fL — ABNORMAL LOW (ref 78.0–100.0)
Platelets: 110 10*3/uL — ABNORMAL LOW (ref 150–400)
RBC: 4.81 MIL/uL (ref 3.87–5.11)
RDW: 19.1 % — ABNORMAL HIGH (ref 11.5–15.5)
WBC: 8.2 10*3/uL (ref 4.0–10.5)

## 2015-11-04 LAB — INFLUENZA PANEL BY PCR (TYPE A & B)
H1N1FLUPCR: NOT DETECTED
INFLBPCR: NEGATIVE
Influenza A By PCR: NEGATIVE

## 2015-11-04 LAB — RENAL FUNCTION PANEL
ANION GAP: 16 — AB (ref 5–15)
Albumin: 2.6 g/dL — ABNORMAL LOW (ref 3.5–5.0)
BUN: 62 mg/dL — ABNORMAL HIGH (ref 6–20)
CALCIUM: 8.4 mg/dL — AB (ref 8.9–10.3)
CO2: 22 mmol/L (ref 22–32)
Chloride: 101 mmol/L (ref 101–111)
Creatinine, Ser: 10.84 mg/dL — ABNORMAL HIGH (ref 0.44–1.00)
GFR calc non Af Amer: 4 mL/min — ABNORMAL LOW (ref >60)
GFR, EST AFRICAN AMERICAN: 4 mL/min — AB (ref >60)
Glucose, Bld: 120 mg/dL — ABNORMAL HIGH (ref 65–99)
Phosphorus: 3.7 mg/dL (ref 2.5–4.6)
Potassium: 5.2 mmol/L — ABNORMAL HIGH (ref 3.5–5.1)
SODIUM: 139 mmol/L (ref 135–145)

## 2015-11-04 LAB — LACTIC ACID, PLASMA: Lactic Acid, Venous: 1.6 mmol/L (ref 0.5–2.0)

## 2015-11-04 LAB — TROPONIN I
TROPONIN I: 0.14 ng/mL — AB (ref <0.031)
TROPONIN I: 0.12 ng/mL — AB (ref <0.031)
TROPONIN I: 0.08 ng/mL — AB (ref <0.031)

## 2015-11-04 LAB — GLUCOSE, CAPILLARY
GLUCOSE-CAPILLARY: 211 mg/dL — AB (ref 65–99)
Glucose-Capillary: 125 mg/dL — ABNORMAL HIGH (ref 65–99)
Glucose-Capillary: 143 mg/dL — ABNORMAL HIGH (ref 65–99)
Glucose-Capillary: 196 mg/dL — ABNORMAL HIGH (ref 65–99)

## 2015-11-04 LAB — MRSA PCR SCREENING: MRSA by PCR: NEGATIVE

## 2015-11-04 MED ORDER — LIDOCAINE HCL (PF) 1 % IJ SOLN: 5.0000 mL | INTRAMUSCULAR | Status: DC | PRN

## 2015-11-04 MED ORDER — SEVELAMER CARBONATE 800 MG PO TABS
1600.0000 mg | ORAL_TABLET | Freq: Three times a day (TID) | ORAL | Status: DC
  Administered 2015-11-04 – 2015-11-08 (×13): 1600 mg via ORAL
  Filled 2015-11-04 (×15): qty 2

## 2015-11-04 MED ORDER — ENOXAPARIN SODIUM 30 MG/0.3ML ~~LOC~~ SOLN
30.0000 mg | SUBCUTANEOUS | Status: DC
  Administered 2015-11-04 – 2015-11-08 (×5): 30 mg via SUBCUTANEOUS
  Filled 2015-11-04 (×6): qty 0.3

## 2015-11-04 MED ORDER — ALTEPLASE 2 MG IJ SOLR
2.0000 mg | Freq: Once | INTRAMUSCULAR | Status: DC | PRN
  Filled 2015-11-04: qty 2

## 2015-11-04 MED ORDER — OXYCODONE-ACETAMINOPHEN 10-325 MG PO TABS: 1.0000 | ORAL_TABLET | Freq: Four times a day (QID) | ORAL | Status: DC | PRN

## 2015-11-04 MED ORDER — OXYCODONE-ACETAMINOPHEN 5-325 MG PO TABS: 1.0000 | ORAL_TABLET | Freq: Four times a day (QID) | ORAL | Status: DC | PRN

## 2015-11-04 MED ORDER — METOPROLOL SUCCINATE ER 100 MG PO TB24
100.0000 mg | ORAL_TABLET | Freq: Every day | ORAL | Status: DC
  Administered 2015-11-04 – 2015-11-08 (×5): 100 mg via ORAL
  Filled 2015-11-04 (×5): qty 1

## 2015-11-04 MED ORDER — DIPHENHYDRAMINE HCL 25 MG PO CAPS
25.0000 mg | ORAL_CAPSULE | Freq: Once | ORAL | Status: AC
  Administered 2015-11-04: 25 mg via ORAL
  Filled 2015-11-04: qty 1

## 2015-11-04 MED ORDER — ONDANSETRON HCL 4 MG PO TABS: 4.0000 mg | ORAL_TABLET | Freq: Four times a day (QID) | ORAL | Status: DC | PRN

## 2015-11-04 MED ORDER — CLONIDINE HCL 0.2 MG PO TABS
0.2000 mg | ORAL_TABLET | Freq: Three times a day (TID) | ORAL | Status: DC
  Administered 2015-11-04 – 2015-11-09 (×13): 0.2 mg via ORAL
  Filled 2015-11-04 (×2): qty 2
  Filled 2015-11-04: qty 1
  Filled 2015-11-04: qty 2
  Filled 2015-11-04 (×4): qty 1
  Filled 2015-11-04 (×2): qty 2
  Filled 2015-11-04 (×4): qty 1

## 2015-11-04 MED ORDER — PROMETHAZINE HCL 25 MG/ML IJ SOLN
12.5000 mg | Freq: Four times a day (QID) | INTRAMUSCULAR | Status: DC | PRN
  Administered 2015-11-04: 12.5 mg via INTRAVENOUS
  Filled 2015-11-04: qty 1

## 2015-11-04 MED ORDER — CLONIDINE HCL 0.1 MG PO TABS
0.2000 mg | ORAL_TABLET | Freq: Two times a day (BID) | ORAL | Status: DC
  Filled 2015-11-04: qty 2

## 2015-11-04 MED ORDER — BISACODYL 10 MG RE SUPP
10.0000 mg | Freq: Every day | RECTAL | Status: DC
  Filled 2015-11-04 (×5): qty 1

## 2015-11-04 MED ORDER — VANCOMYCIN HCL 1000 MG IV SOLR
1500.0000 mg | Freq: Once | INTRAVENOUS | Status: AC
  Administered 2015-11-04: 1500 mg via INTRAVENOUS
  Filled 2015-11-04: qty 1500

## 2015-11-04 MED ORDER — ACETAMINOPHEN 325 MG PO TABS
650.0000 mg | ORAL_TABLET | ORAL | Status: DC | PRN
  Administered 2015-11-04 – 2015-11-08 (×3): 650 mg via ORAL
  Filled 2015-11-04 (×3): qty 2

## 2015-11-04 MED ORDER — ONDANSETRON HCL 4 MG/2ML IJ SOLN: 4.0000 mg | Freq: Four times a day (QID) | INTRAMUSCULAR | Status: DC | PRN

## 2015-11-04 MED ORDER — PREGABALIN 75 MG PO CAPS
75.0000 mg | ORAL_CAPSULE | Freq: Two times a day (BID) | ORAL | Status: DC
  Administered 2015-11-04 – 2015-11-09 (×9): 75 mg via ORAL
  Filled 2015-11-04 (×10): qty 1

## 2015-11-04 MED ORDER — PANTOPRAZOLE SODIUM 40 MG PO TBEC
80.0000 mg | DELAYED_RELEASE_TABLET | Freq: Every day | ORAL | Status: DC
  Administered 2015-11-04 – 2015-11-08 (×5): 80 mg via ORAL
  Filled 2015-11-04 (×5): qty 2

## 2015-11-04 MED ORDER — LEVOTHYROXINE SODIUM 125 MCG PO TABS
125.0000 ug | ORAL_TABLET | Freq: Every day | ORAL | Status: DC
  Administered 2015-11-04 – 2015-11-07 (×4): 125 ug via ORAL
  Filled 2015-11-04 (×7): qty 1

## 2015-11-04 MED ORDER — PENTAFLUOROPROP-TETRAFLUOROETH EX AERO: 1.0000 "application " | INHALATION_SPRAY | CUTANEOUS | Status: DC | PRN

## 2015-11-04 MED ORDER — PROMETHAZINE HCL 25 MG PO TABS: 25.0000 mg | ORAL_TABLET | Freq: Four times a day (QID) | ORAL | Status: DC | PRN

## 2015-11-04 MED ORDER — ASPIRIN 81 MG PO CHEW
81.0000 mg | CHEWABLE_TABLET | Freq: Every day | ORAL | Status: DC
  Administered 2015-11-04 – 2015-11-09 (×6): 81 mg via ORAL
  Filled 2015-11-04 (×6): qty 1

## 2015-11-04 MED ORDER — HEPARIN SODIUM (PORCINE) 1000 UNIT/ML DIALYSIS: 4000.0000 [IU] | Freq: Once | INTRAMUSCULAR | Status: DC

## 2015-11-04 MED ORDER — NIFEDIPINE ER 60 MG PO TB24
60.0000 mg | ORAL_TABLET | Freq: Two times a day (BID) | ORAL | Status: DC
  Administered 2015-11-04 – 2015-11-09 (×9): 60 mg via ORAL
  Filled 2015-11-04 (×15): qty 1

## 2015-11-04 MED ORDER — PROMETHAZINE HCL 25 MG RE SUPP: 25.0000 mg | Freq: Four times a day (QID) | RECTAL | Status: DC | PRN

## 2015-11-04 MED ORDER — PRAVASTATIN SODIUM 40 MG PO TABS
40.0000 mg | ORAL_TABLET | Freq: Every day | ORAL | Status: DC
  Administered 2015-11-04 – 2015-11-08 (×5): 40 mg via ORAL
  Filled 2015-11-04 (×5): qty 1

## 2015-11-04 MED ORDER — HYDRALAZINE HCL 20 MG/ML IJ SOLN: 10.0000 mg | Freq: Four times a day (QID) | INTRAMUSCULAR | Status: DC | PRN

## 2015-11-04 MED ORDER — HEPARIN SODIUM (PORCINE) 1000 UNIT/ML DIALYSIS: 1000.0000 [IU] | INTRAMUSCULAR | Status: DC | PRN

## 2015-11-04 MED ORDER — LIDOCAINE-PRILOCAINE 2.5-2.5 % EX CREA
1.0000 "application " | TOPICAL_CREAM | CUTANEOUS | Status: DC | PRN
  Filled 2015-11-04: qty 5

## 2015-11-04 MED ORDER — ONDANSETRON HCL 4 MG/2ML IJ SOLN
INTRAMUSCULAR | Status: AC
  Administered 2015-11-04: 4 mg
  Filled 2015-11-04: qty 2

## 2015-11-04 MED ORDER — OXYCODONE HCL 5 MG PO TABS
5.0000 mg | ORAL_TABLET | Freq: Four times a day (QID) | ORAL | Status: DC | PRN
  Administered 2015-11-04 – 2015-11-05 (×3): 5 mg via ORAL
  Filled 2015-11-04 (×3): qty 1

## 2015-11-04 MED ORDER — SODIUM CHLORIDE 0.9 % IV SOLN: 100.0000 mL | INTRAVENOUS | Status: DC | PRN

## 2015-11-04 MED ORDER — SEVELAMER CARBONATE 800 MG PO TABS: 800.0000 mg | ORAL_TABLET | ORAL | Status: DC | PRN

## 2015-11-04 MED ORDER — PIPERACILLIN-TAZOBACTAM IN DEX 2-0.25 GM/50ML IV SOLN
2.2500 g | Freq: Three times a day (TID) | INTRAVENOUS | Status: DC
  Administered 2015-11-04 – 2015-11-08 (×10): 2.25 g via INTRAVENOUS
  Filled 2015-11-04 (×15): qty 50

## 2015-11-04 MED ORDER — DOCUSATE SODIUM 100 MG PO CAPS
200.0000 mg | ORAL_CAPSULE | Freq: Two times a day (BID) | ORAL | Status: DC
  Administered 2015-11-04 – 2015-11-09 (×10): 200 mg via ORAL
  Filled 2015-11-04 (×10): qty 2

## 2015-11-04 MED ORDER — INSULIN GLARGINE 100 UNIT/ML ~~LOC~~ SOLN
10.0000 [IU] | Freq: Every day | SUBCUTANEOUS | Status: DC
  Administered 2015-11-04 – 2015-11-09 (×6): 10 [IU] via SUBCUTANEOUS
  Filled 2015-11-04 (×6): qty 0.1

## 2015-11-04 MED ORDER — INSULIN ASPART 100 UNIT/ML ~~LOC~~ SOLN
0.0000 [IU] | Freq: Three times a day (TID) | SUBCUTANEOUS | Status: DC
  Administered 2015-11-04: 1 [IU] via SUBCUTANEOUS
  Administered 2015-11-04: 3 [IU] via SUBCUTANEOUS
  Administered 2015-11-04: 1 [IU] via SUBCUTANEOUS
  Administered 2015-11-05: 2 [IU] via SUBCUTANEOUS
  Administered 2015-11-05 – 2015-11-07 (×3): 1 [IU] via SUBCUTANEOUS
  Administered 2015-11-07: 2 [IU] via SUBCUTANEOUS
  Administered 2015-11-08: 1 [IU] via SUBCUTANEOUS
  Administered 2015-11-08: 2 [IU] via SUBCUTANEOUS
  Administered 2015-11-09: 1 [IU] via SUBCUTANEOUS

## 2015-11-04 MED ORDER — ONDANSETRON HCL 4 MG/2ML IJ SOLN: 4.0000 mg | Freq: Three times a day (TID) | INTRAMUSCULAR | Status: DC | PRN

## 2015-11-04 MED ORDER — INSULIN ASPART 100 UNIT/ML ~~LOC~~ SOLN: 0.0000 [IU] | Freq: Every day | SUBCUTANEOUS | Status: DC

## 2015-11-04 MED ORDER — ALBUTEROL SULFATE (2.5 MG/3ML) 0.083% IN NEBU
2.5000 mg | INHALATION_SOLUTION | RESPIRATORY_TRACT | Status: DC | PRN
  Administered 2015-11-05: 2.5 mg via RESPIRATORY_TRACT
  Filled 2015-11-04: qty 3

## 2015-11-04 MED ORDER — ONDANSETRON HCL 4 MG PO TABS: 4.0000 mg | ORAL_TABLET | Freq: Three times a day (TID) | ORAL | Status: DC | PRN

## 2015-11-04 NOTE — ED Notes (Signed)
Lasix, and calcium gluconate not compatible NTG  Lasix and calcium gluconate infusing.

## 2015-11-04 NOTE — Consult Note (Signed)
Pharmacy Antibiotic Note  [name] is a 55 y.o. female admitted on 11/03/2015 with pneumonia.  Pharmacy has been consulted for vancomycin and zosyn dosing. She is ESRD on HD TTS, last HD session was this morning. Renal thinking she may require an extra session tomorrow volume overload but are waiting until tomorrow to reassess.  Plan: 1) Vancomycin 1500mg  IV x 1 loading dose 2) Zosyn 2.25g IV q8 3) Follow up HD schedule for further vancomycin maintenance doses  Height: 5\' 2"  (157.5 cm) Weight: 208 lb 5.4 oz (94.5 kg) IBW/kg (Calculated) : 50.1  Temp (24hrs), Avg:99.5 F (37.5 C), Min:97.9 F (36.6 C), Max:101.1 F (38.4 C)   Recent Labs Lab 11/03/15 1940 11/04/15 0500  WBC 8.7 8.2  CREATININE 10.44* 10.84*  LATICACIDVEN  --  1.6    Estimated Creatinine Clearance: 6.4 mL/min (by C-G formula based on Cr of 10.84).    No Known Allergies  Antimicrobials this admission: 2/14 Vancomycin >> 2/14 Zosyn >>  Dose adjustments this admission: n/a  Microbiology results: None yet  Thank you for allowing pharmacy to be a part of this patient's care.  Deboraha Sprang 11/04/2015 1:15 PM

## 2015-11-04 NOTE — Progress Notes (Signed)
[facility] Rounding Note Subjective:  Tolerated HD O2 weaned to NRB Now 2 kg under stated EDW of 96.5 (94.5 kg) Got just over 3 liters of with HD Off IV nitro Has temp 100 post HD  Objective Vital signs in last 24 hours: Filed Vitals:   11/04/15 0815 11/04/15 0830 11/04/15 0845 11/04/15 0900  BP: 134/67 133/72 135/65 167/82  Pulse: 86 89 88 91  Temp:      TempSrc:      Resp: 30 27 30  33  Height:      Weight:    94.5 kg (208 lb 5.4 oz)  SpO2: 96% 96% 94% 96%   Weight change:   Intake/Output Summary (Last 24 hours) at 11/04/15 0925 Last data filed at 11/04/15 0900  Gross per 24 hour  Intake  21.69 ml  Output   3095 ml  Net -3073.31 ml   Physical Exam:  BP 167/82 mmHg  Pulse 91  Temp(Src) 100 F (37.8 C) (Axillary)  Resp 33  Ht 5\' 2"  (1.575 m)  Wt 94.5 kg (208 lb 5.4 oz)  BMI 38.10 kg/m2  SpO2 96% NAD at this time. On NRB. Sats 100% but stilll tachypneic Neck: No JVD Resp: Few base crackles Cardio: regular rate and rhythm, S1, S2 normal, no murmur, click, rub or gallop GI: + BS No sig edema  Labs: Basic Metabolic Panel:  Recent Labs Lab 11/03/15 1940 11/04/15 0500  NA 138 139  K 6.4* 5.2*  CL 97* 101  CO2 22 22  GLUCOSE 99 120*  BUN 60* 62*  CREATININE 10.44* 10.84*  CALCIUM 8.9 8.4*  PHOS  --  3.7   Liver Function Tests:  Recent Labs Lab 11/03/15 1940 11/04/15 0500  AST 32  --   ALT 32  --   ALKPHOS 207*  --   BILITOT 0.6  --   PROT 8.3*  --   ALBUMIN 3.6 2.6*   CBC:  Recent Labs Lab 11/03/15 1940 11/04/15 0500  WBC 8.7 8.2  NEUTROABS 6.6  --   HGB 13.9 11.6*  HCT 43.3 36.0  MCV 75.6* 74.8*  PLT 120* 110*     Recent Labs Lab 11/04/15 0500  TROPONINI 0.14*   CBG:  Recent Labs Lab 11/04/15 0816  GLUCAP 125*    Studies/Results: Dg Chest Portable 1 View  11/03/2015  CLINICAL DATA:  55 year old female with persistent progressive productive cough and sudden onset shortness of breath despite being on antibiotics for recent  diagnosis of pneumonia EXAM: PORTABLE CHEST 1 VIEW COMPARISON:  Prior chest x-ray 06/10/15 FINDINGS: Stable cardiac and mediastinal contours. Diffuse bilateral interstitial and airspace opacities significantly increased compared to prior imaging. No pneumothorax or definite large pleural effusion. Long segment stent overlies the left shoulder girdle. This is likely within the venous system and related to an underlying hemodialysis access. No acute osseous abnormality. IMPRESSION: Mild cardiomegaly with diffuse bilateral interstitial and airspace opacities. The imaging appearance is most suggestive of moderate CHF versus volume overload. Additional differential considerations include atypical or viral pneumonia, less likely multi lobar pneumonia or ARDS. Electronically Signed   By: Jacqulynn Cadet M.D.   On: 11/03/2015 17:10   Medications: . nitroGLYCERIN Stopped (11/04/15 0720)   . aspirin  81 mg Oral Daily  . cloNIDine  0.2 mg Oral BID  . enoxaparin (LOVENOX) injection  30 mg Subcutaneous Q24H  . heparin  4,000 Units Dialysis Once in dialysis  . insulin aspart  0-5 Units Subcutaneous QHS  . insulin aspart  0-9  Units Subcutaneous TID WC  . insulin glargine  10 Units Subcutaneous Daily  . levothyroxine  125 mcg Oral QAC breakfast  . metoprolol succinate  100 mg Oral Q supper  . NIFEdipine  60 mg Oral BID  . pantoprazole  80 mg Oral Q1200  . pravastatin  40 mg Oral QHS  . pregabalin  75 mg Oral BID  . sevelamer carbonate  1,600 mg Oral TID WC   Background 55 y.o. female with a h/o DM, HTN and ESRD TTS regimen with home unit in [place], recently a transient at the [facility] unit with Dr. Marval Regal, last HD PTA on 11/01/2015 @ which time she left dialysis 0.6 kg up on her EDW of 96.5kg. Admitted a month ago with PNA requiring intubation but she completed a full course of antibiotics and was back to her baseline state of health. Sunday she started to have a cough with productive sputum which worsened  Monday with ensuing dyspnea as well. She was found to have findings c/w pulmonary edema on CXR in the ED with no obvious infiltrate, started on BiPAP + nitroglycerin gtt for hypertensive urgency. She required urgent dialysis this AM 2/14 because of a low O2 sat despite supplemental O2   Assessment/Plan: 1. CHF - decompensated with no obvious source of infiltrate on CXR, recently admitted in [place] with PNA but completed a full course of antibiotics. Sats in the high 80's and low 90's prior to initiating dialysis. 3 liters off with HD - sats 100% on NRB but remains tachypneic and now is febrile. She is by weights now 2 kg below prev EDW.  Certainly a cardiac w/u is warranted. Repeat CXR now that fluid has been removed (ordered - still not done).  2. Fever - on droplet precautions. Just had nasal swab for flu. Not on ATB's/ 3. ESRD - last HD PTA was  Sat 11/01/15 (usually on TTS regimen) home unit in [place], transient at the [facility] unit with Dr. Marval Regal. Tolerated HD today 3 liters off.   Plan on reassessing in AM and determining if she will need more dialysis/UF 4. Hypertensive urgency - certainly may have an element of demand ischemia as well but feeling better now that her BP is better + UF. Goal no more than 30% drop in BP in the first 24 hrs to decrease risk of cerebral hypoperfusion 5. Hyperkalemia - improved after cocktail + HD. Will reassess in am and see if she will actually need supplementation. 6. IDDM  Jamal Maes, MD [facility] [phone] pager 11/04/2015, 9:25 AM

## 2015-11-04 NOTE — Consult Note (Signed)
Reason for Consult:ESRD Referring Physician: Dr. Norina Buzzard  Chief Complaint: Dyspnea  Assessment/Plan: 1. CHF - decompensated with no obvious source of infiltrate on CXR, recently admitted in [place] with PNA but completed a full course of antibiotics. Sats in the high 80's and low 90's prior to initiating dialysis. She required supplemental O2 in the ED and was also found to be hypertensive (started on Nitroglycerin gtt) + tachypneic with no EKG changes.  - Already sats are in the high 90's after a few hours on dialysis and Nitro has been titrated off with the drop in BP. - Certainly a cardiac w/u is warranted. 2. ESRD - last HD on Sat  On 11/01/15 (usually on TTS regimen) home unit in [place], transient at the [facility] unit with Dr. Marval Regal.  - Seen on dialysis on 2/14 @ 725am --> sats much better and RR improved after starting dialysis. She is tolerating UF with nitroglycerin gtt now titrated completely off.  - 2K/2.2.5 Ca bath through left upper arm graft with no complaints.  - Off nitroglycerin and sats 98%  - Plan on reassessing in AM and determining if she will need more dialysis/UF --> she left dialysis on Sat ~0.6kg up on her presumed EDW of 96.5kg. 3. Hypertensive urgency - certainly may have an element of demand ischemia as well but feeling better now that her BP is better + UF. Goal no more than 30% drop in BP in the first 24 hrs to decrease risk of cerebral hypoperfusion; may need to limit UF if her BP drops more.  4. Hyperkalemia - improved after cocktail + HD. Will reassess in am and see if she will actually need supplementation. 5. IDDM    HPI: [name] is an 55 y.o. female with a h/o DM, HTN and ESRD TTS regimen with home unit in [place], recently a transient at the [facility] unit with Dr. Marval Regal, last HD on 11/01/2015 @ which time she left dialysis 0.6 kg up on her EDW of 96.5kg. She was admitted a month ago with PNA requiring intubation but she completed a  full course of antibiotics and was back to her baseline state of health. Unfortunately Sunday she started to have a cough with productive sputum which worsened Monday with ensuing dyspnea as well. She was found to have findings c/w pulmonary edema on CXR in the ED with no obvious infiltrate, started on BiPAP + nitroglycerin gtt for hypertensive urgency. She required urgent dialysis this AM because of a low O2 sat despite supplemental O2 but has since beein the 98% since having tolerated UF on HD with itration of the nitroglycerin off as well.  ROS Pertinent items are noted in HPI.  Chemistry and CBC: CREATININE, SER  Date/Time Value Ref Range Status  11/04/2015 05:00 AM 10.84* 0.44 - 1.00 mg/dL Final  11/03/2015 07:40 PM 10.44* 0.44 - 1.00 mg/dL Final  06/12/2015 10:48 AM 9.32* 0.44 - 1.00 mg/dL Final  06/12/2015 10:48 AM 9.37* 0.44 - 1.00 mg/dL Final    Comment:    DELTA CHECK NOTED  06/11/2015 10:32 AM 5.10* 0.44 - 1.00 mg/dL Final  06/11/2015 05:30 AM 10.67* 0.44 - 1.00 mg/dL Final  06/10/2015 03:20 PM 9.71* 0.44 - 1.00 mg/dL Final  04/01/2015 04:57 AM 7.80* 0.44 - 1.00 mg/dL Final  04/01/2015 04:44 AM 8.16* 0.44 - 1.00 mg/dL Final  03/12/2015 08:58 PM 5.86* 0.44 - 1.00 mg/dL Final  03/12/2015 06:01 PM 5.50* 0.44 - 1.00 mg/dL Final  12/15/2014 06:44 PM  9.79* 0.50 - 1.10 mg/dL Final  09/25/2014 01:30 PM 3.82* 0.50 - 1.10 mg/dL Final  02/01/2014 01:30 PM 4.00* 0.50 - 1.10 mg/dL Final  10/07/2013 03:07 PM 4.60* 0.50 - 1.10 mg/dL Final  10/07/2013 11:10 AM 8.07* 0.50 - 1.10 mg/dL Final  06/08/2013 07:36 AM 8.55* 0.50 - 1.10 mg/dL Final  06/06/2013 08:28 AM 9.44* 0.50 - 1.10 mg/dL Final  06/05/2013 04:50 AM 7.02* 0.50 - 1.10 mg/dL Final  06/04/2013 05:25 PM 5.09* 0.50 - 1.10 mg/dL Final  02/14/2013 09:24 AM 6.40* 0.50 - 1.10 mg/dL Final  07/20/2012 04:35 AM 4.79* 0.50 - 1.10 mg/dL Final    Comment:    DELTA CHECK NOTED  07/19/2012 04:50 AM 2.52* 0.50 - 1.10 mg/dL Final  07/18/2012  04:25 AM 3.38* 0.50 - 1.10 mg/dL Final    Comment:    DELTA CHECK NOTED  07/17/2012 04:40 AM 5.96* 0.50 - 1.10 mg/dL Final  07/16/2012 10:00 AM 4.84* 0.50 - 1.10 mg/dL Final  07/16/2012 04:10 AM 4.22* 0.50 - 1.10 mg/dL Final    Comment:    DELTA CHECK NOTED  07/15/2012 04:52 AM 6.98* 0.50 - 1.10 mg/dL Final    Comment:    DELTA CHECK NOTED  07/14/2012 03:30 AM 4.64* 0.50 - 1.10 mg/dL Final  07/13/2012 04:00 AM 4.33* 0.50 - 1.10 mg/dL Final  07/12/2012 11:50 PM 4.04* 0.50 - 1.10 mg/dL Final  07/12/2012 07:40 PM 3.38* 0.50 - 1.10 mg/dL Final  07/12/2012 04:00 PM 2.51* 0.50 - 1.10 mg/dL Final    Comment:    DELTA CHECK NOTED  07/12/2012 11:55 AM 5.52* 0.50 - 1.10 mg/dL Final  07/12/2012 09:20 AM 5.28* 0.50 - 1.10 mg/dL Final  07/12/2012 03:52 AM 4.80* 0.50 - 1.10 mg/dL Final  07/11/2012 10:49 PM 4.50* 0.50 - 1.10 mg/dL Final  07/11/2012 07:09 PM 1.77* 0.50 - 1.10 mg/dL Final    Comment:    DELTA CHECK NOTED  07/11/2012 06:20 PM 3.28* 0.50 - 1.10 mg/dL Final  07/11/2012 05:06 PM 3.00* 0.50 - 1.10 mg/dL Final  07/11/2012 04:54 PM 8.22* 0.50 - 1.10 mg/dL Final  07/11/2012 02:57 PM 7.00* 0.50 - 1.10 mg/dL Final  07/11/2012 12:07 PM 7.95* 0.50 - 1.10 mg/dL Final  07/11/2012 11:54 AM 6.40* 0.50 - 1.10 mg/dL Final  06/20/2012 02:33 PM 5.20* 0.50 - 1.10 mg/dL Final  05/22/2012 11:40 PM 8.41* 0.50 - 1.10 mg/dL Final  06/06/2009 02:30 AM 4.15* 0.4 - 1.2 mg/dL Final  03/13/2009 04:05 AM 3.24* 0.4 - 1.2 mg/dL Final  03/12/2009 05:10 AM 3.50* 0.4 - 1.2 mg/dL Final  03/11/2009 04:45 AM 3.71* 0.4 - 1.2 mg/dL Final  03/10/2009 05:30 AM 3.49* 0.4 - 1.2 mg/dL Final  03/09/2009 03:54 AM 3.15* 0.4 - 1.2 mg/dL Final    Recent Labs Lab 11/03/15 1940 11/04/15 0500  NA 138 139  K 6.4* 5.2*  CL 97* 101  CO2 22 22  GLUCOSE 99 120*  BUN 60* 62*  CREATININE 10.44* 10.84*  CALCIUM 8.9 8.4*  PHOS  --  3.7    Recent Labs Lab 11/03/15 1940 11/04/15 0500  WBC 8.7 8.2  NEUTROABS 6.6  --    HGB 13.9 11.6*  HCT 43.3 36.0  MCV 75.6* 74.8*  PLT 120* 110*   Liver Function Tests:  Recent Labs Lab 11/03/15 1940 11/04/15 0500  AST 32  --   ALT 32  --   ALKPHOS 207*  --   BILITOT 0.6  --   PROT 8.3*  --   ALBUMIN 3.6 2.6*  No results for input(s): LIPASE, AMYLASE in the last 168 hours. No results for input(s): AMMONIA in the last 168 hours. Cardiac Enzymes:  Recent Labs Lab 11/04/15 0500  TROPONINI 0.14*   Iron Studies: No results for input(s): IRON, TIBC, TRANSFERRIN, FERRITIN in the last 72 hours. PT/INR: @LABRCNTIP (inr:5)  Xrays/Other Studies: ) Results for orders placed or performed during the hospital encounter of 11/03/15 (from the past 48 hour(s))  Comprehensive metabolic panel     Status: Abnormal   Collection Time: 11/03/15  7:40 PM  Result Value Ref Range   Sodium 138 135 - 145 mmol/L   Potassium 6.4 (HH) 3.5 - 5.1 mmol/L    Comment: CRITICAL RESULT CALLED TO, READ BACK BY AND VERIFIED WITHNeysa Bonito RN [phone] [id number] GREEN R    Chloride 97 (L) 101 - 111 mmol/L   CO2 22 22 - 32 mmol/L   Glucose, Bld 99 65 - 99 mg/dL   BUN 60 (H) 6 - 20 mg/dL   Creatinine, Ser 10.44 (H) 0.44 - 1.00 mg/dL   Calcium 8.9 8.9 - 10.3 mg/dL   Total Protein 8.3 (H) 6.5 - 8.1 g/dL   Albumin 3.6 3.5 - 5.0 g/dL   AST 32 15 - 41 U/L   ALT 32 14 - 54 U/L   Alkaline Phosphatase 207 (H) 38 - 126 U/L   Total Bilirubin 0.6 0.3 - 1.2 mg/dL   GFR calc non Af Amer 4 (L) >60 mL/min   GFR calc Af Amer 4 (L) >60 mL/min    Comment: (NOTE) The eGFR has been calculated using the CKD EPI equation. This calculation has not been validated in all clinical situations. eGFR's persistently <60 mL/min signify possible Chronic Kidney Disease.    Anion gap 19 (H) 5 - 15  CBC with Differential     Status: Abnormal   Collection Time: 11/03/15  7:40 PM  Result Value Ref Range   WBC 8.7 4.0 - 10.5 K/uL   RBC 5.73 (H) 3.87 - 5.11 MIL/uL   Hemoglobin 13.9 12.0 - 15.0 g/dL   HCT 43.3 36.0 -  46.0 %   MCV 75.6 (L) 78.0 - 100.0 fL   MCH 24.3 (L) 26.0 - 34.0 pg   MCHC 32.1 30.0 - 36.0 g/dL   RDW 19.5 (H) 11.5 - 15.5 %   Platelets 120 (L) 150 - 400 K/uL   Neutrophils Relative % 77 %   Neutro Abs 6.6 1.7 - 7.7 K/uL   Lymphocytes Relative 18 %   Lymphs Abs 1.6 0.7 - 4.0 K/uL   Monocytes Relative 5 %   Monocytes Absolute 0.4 0.1 - 1.0 K/uL   Eosinophils Relative 1 %   Eosinophils Absolute 0.0 0.0 - 0.7 K/uL   Basophils Relative 0 %   Basophils Absolute 0.0 0.0 - 0.1 K/uL  Brain natriuretic peptide     Status: Abnormal   Collection Time: 11/03/15  7:40 PM  Result Value Ref Range   B Natriuretic Peptide 822.1 (H) 0.0 - 100.0 pg/mL  I-Stat Troponin, ED (not at [facility])     Status: None   Collection Time: 11/03/15  7:43 PM  Result Value Ref Range   Troponin i, poc 0.02 0.00 - 0.08 ng/mL   Comment 3            Comment: Due to the release kinetics of cTnI, a negative result within the first hours of the onset of symptoms does not rule out myocardial infarction with certainty. If myocardial infarction is still  suspected, repeat the test at appropriate intervals.   I-Stat arterial blood gas, ED     Status: Abnormal   Collection Time: 11/04/15  1:16 AM  Result Value Ref Range   pH, Arterial 7.330 (L) 7.350 - 7.450   pCO2 arterial 42.1 35.0 - 45.0 mmHg   pO2, Arterial 71.0 (L) 80.0 - 100.0 mmHg   Bicarbonate 22.2 20.0 - 24.0 mEq/L   TCO2 23 0 - 100 mmol/L   O2 Saturation 93.0 %   Acid-base deficit 4.0 (H) 0.0 - 2.0 mmol/L   Patient temperature 98.7 F    Collection site RADIAL, ALLEN'S TEST ACCEPTABLE    Drawn by Operator    Sample type ARTERIAL   MRSA PCR Screening     Status: None   Collection Time: 11/04/15  3:11 AM  Result Value Ref Range   MRSA by PCR NEGATIVE NEGATIVE    Comment:        The GeneXpert MRSA Assay (FDA approved for NASAL specimens only), is one component of a comprehensive MRSA colonization surveillance program. It is not intended to diagnose  MRSA infection nor to guide or monitor treatment for MRSA infections.   Renal function panel     Status: Abnormal   Collection Time: 11/04/15  5:00 AM  Result Value Ref Range   Sodium 139 135 - 145 mmol/L   Potassium 5.2 (H) 3.5 - 5.1 mmol/L    Comment: DELTA CHECK NOTED   Chloride 101 101 - 111 mmol/L   CO2 22 22 - 32 mmol/L   Glucose, Bld 120 (H) 65 - 99 mg/dL   BUN 62 (H) 6 - 20 mg/dL   Creatinine, Ser 10.84 (H) 0.44 - 1.00 mg/dL   Calcium 8.4 (L) 8.9 - 10.3 mg/dL   Phosphorus 3.7 2.5 - 4.6 mg/dL   Albumin 2.6 (L) 3.5 - 5.0 g/dL   GFR calc non Af Amer 4 (L) >60 mL/min   GFR calc Af Amer 4 (L) >60 mL/min    Comment: (NOTE) The eGFR has been calculated using the CKD EPI equation. This calculation has not been validated in all clinical situations. eGFR's persistently <60 mL/min signify possible Chronic Kidney Disease.    Anion gap 16 (H) 5 - 15  CBC     Status: Abnormal   Collection Time: 11/04/15  5:00 AM  Result Value Ref Range   WBC 8.2 4.0 - 10.5 K/uL   RBC 4.81 3.87 - 5.11 MIL/uL   Hemoglobin 11.6 (L) 12.0 - 15.0 g/dL   HCT 36.0 36.0 - 46.0 %   MCV 74.8 (L) 78.0 - 100.0 fL   MCH 24.1 (L) 26.0 - 34.0 pg   MCHC 32.2 30.0 - 36.0 g/dL   RDW 19.1 (H) 11.5 - 15.5 %   Platelets 110 (L) 150 - 400 K/uL    Comment: PLATELET COUNT CONFIRMED BY SMEAR REPEATED TO VERIFY   Troponin I     Status: Abnormal   Collection Time: 11/04/15  5:00 AM  Result Value Ref Range   Troponin I 0.14 (H) <0.031 ng/mL    Comment:        PERSISTENTLY INCREASED TROPONIN VALUES IN THE RANGE OF 0.04-0.49 ng/mL CAN BE SEEN IN:       -UNSTABLE ANGINA       -CONGESTIVE HEART FAILURE       -MYOCARDITIS       -CHEST TRAUMA       -ARRYHTHMIAS       -LATE PRESENTING MYOCARDIAL INFARCTION       -  COPD   CLINICAL FOLLOW-UP RECOMMENDED.   Lactic acid, plasma     Status: None   Collection Time: 11/04/15  5:00 AM  Result Value Ref Range   Lactic Acid, Venous 1.6 0.5 - 2.0 mmol/L   Dg Chest  Portable 1 View  11/03/2015  CLINICAL DATA:  55 year old female with persistent progressive productive cough and sudden onset shortness of breath despite being on antibiotics for recent diagnosis of pneumonia EXAM: PORTABLE CHEST 1 VIEW COMPARISON:  Prior chest x-ray 06/10/15 FINDINGS: Stable cardiac and mediastinal contours. Diffuse bilateral interstitial and airspace opacities significantly increased compared to prior imaging. No pneumothorax or definite large pleural effusion. Long segment stent overlies the left shoulder girdle. This is likely within the venous system and related to an underlying hemodialysis access. No acute osseous abnormality. IMPRESSION: Mild cardiomegaly with diffuse bilateral interstitial and airspace opacities. The imaging appearance is most suggestive of moderate CHF versus volume overload. Additional differential considerations include atypical or viral pneumonia, less likely multi lobar pneumonia or ARDS. Electronically Signed   By: Jacqulynn Cadet M.D.   On: 11/03/2015 17:10    PMH:   Past Medical History  Diagnosis Date  . Diabetes mellitus, type II, insulin dependent ([place])   . Paroxysmal supraventricular tachycardia ([place])   . End stage renal disease on dialysis ([place])     Hemodialysis T, TH, Sat  . Hypothyroidism (acquired)   . Essential hypertension     Difficult control. On multiple medications.  . Diabetic peripheral neuropathy associated with type 2 diabetes mellitus ([place])   . Peripheral arterial disease [facility]) December 2014    @ [place]: a) RSFA PTA (01/2013); b) L SFA [place] Atherectomy/PTA with 3V runoff; LEA Dopplers January 2016 [facility] post L SFA PTA): Mild, insignificant disease in the left CFA, profunda, SFA, popliteal artery and tibioperoneal trunk.  . 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 Hemodialysis patient ([place])   . Hyperlipidemia   . Sickle cell trait ([place])   . Gastroparesis  due to DM ([place])   . GERD (gastroesophageal reflux disease)   . Heart murmur   . Arthritis     PSH:   Past Surgical History  Procedure Laterality Date  . Abdominal hysterectomy    . Knee surgery      right  . Thyroid surgery    . Thyroidectomy    . Knee arthroplasty    . Cataract extraction, bilateral    . Colonoscopy  07/19/2012    Procedure: COLONOSCOPY;  Surgeon: Beryle Beams, MD;  Location: [place];  Service: Endoscopy;  Laterality: N/A;  . Arteriovenous graft placement Left 2012    Dr. Harrington Challenger  2nd graft  . Shuntogram Left 02/14/2013    Procedure: FISTULOGRAM;  Surgeon: Serafina Mitchell, MD;  Location: [facility] C[facility] LAB;  Service: Cardiovascular;  Laterality: Left;  . Nm myoview ltd  September 2009; 01/15/2015    a) @ [place] [facility] - Negative for inducible or reversible ischemia with pharmacologic stress. EF 63% ; b) [facility]: EF 68%. No RWMA, negative S4 reversible coronary ischemia or infarction.  . Carotid ultrasound   May 2011    Normal ([place], [place])  . Transthoracic echocardiogram  October 2013; January 2015    a) [name]) 2013: normal LV size and function. Mild LVH. EF 55-60%. no RWMA, No valve disease; b) [place] 1/'15: EF 70-75%, mild MR. Normal diastolic dysfunction  . Peripheral vascular catheterization  May  2014; December 2014    a) RSFA & PopA mod-severe disease -- R SFA PTA, Med Rx of L SFA; b) distal abdominal and iliac & CFA arteries patent.  m-d LSFA long 60%, LPop 60% -> 3 V runoff. RSFA ~40% mid, RPop 60%, 3V runoff  . Peripheral angioplasty  May 2014    PTA of R SFA-PopA.  Marland Kitchen Peripheral athrectomy  December 2014    [place] Atherectomy of L SFA-L PopA reduction of 60% to 10% stenosis   . Fistulogram Left 06/04/2015    Procedure: FISTULOGRAM;  Surgeon: Conrad [place], MD;  Location: [place];  Service: Vascular;  Laterality: Left;  . Revision of arteriovenous goretex graft Left 06/04/2015    Procedure: REVISION OF ARTERIOVENOUS  GORETEX GRAFT;  Surgeon: Conrad [place], MD;  Location: [place];  Service: Vascular;  Laterality: Left;    Allergies: No Known Allergies  Medications:   Prior to Admission medications   Medication Sig Start Date End Date Taking? Authorizing Provider  aspirin 81 MG chewable tablet Chew 81 mg by mouth daily.   Yes Historical Provider, MD  cloNIDine (CATAPRES) 0.2 MG tablet Take 0.2 mg by mouth 2 (two) times daily.    Yes Historical Provider, MD  esomeprazole (NEXIUM) 40 MG capsule Take 40 mg by mouth daily before breakfast.   Yes Historical Provider, MD  Insulin Lispro Prot & Lispro (HUMALOG MIX 75/25 KWIKPEN) (75-25) 100 UNIT/ML Kwikpen Inject 20-30 Units into the skin 2 (two) times daily with a meal. Inject 30 units subcutaneously with breakfast and 20 units with supper   Yes Historical Provider, MD  levothyroxine (SYNTHROID, LEVOTHROID) 125 MCG tablet Take 125 mcg by mouth daily before breakfast.    Yes Historical Provider, MD  metoprolol succinate (TOPROL-XL) 100 MG 24 hr tablet Take 100 mg by mouth daily with supper. Take with or immediately following a meal.   Yes Historical Provider, MD  NIFEdipine (PROCARDIA-XL/ADALAT CC) 60 MG 24 hr tablet Take 60 mg by mouth 2 (two) times daily. 10/16/15  Yes Historical Provider, MD  oxyCODONE-acetaminophen (PERCOCET) 10-325 MG per tablet Take 1 tablet by mouth every 6 (six) hours as needed for pain. 06/04/15  Yes Samantha J Rhyne, PA-C  pravastatin (PRAVACHOL) 40 MG tablet Take 40 mg by mouth at bedtime.    Yes Historical Provider, MD  pregabalin (LYRICA) 75 MG capsule Take 75 mg by mouth 2 (two) times daily.   Yes Historical Provider, MD  promethazine (PHENERGAN) 25 MG tablet Take 25 mg by mouth every 8 (eight) hours as needed for nausea or vomiting.    Yes Historical Provider, MD  sevelamer carbonate (RENVELA) 800 MG tablet Take 800-1,600 mg by mouth 3 (three) times daily with meals. Take 2 tablets (1600 mg) by mouth with meals, and 1 tablet (800 mg) with  snacks   Yes Historical Provider, MD    Discontinued Meds:   Medications Discontinued During This Encounter  Medication Reason  . NIFEdipine (PROCARDIA XL/ADALAT-CC) 90 MG 24 hr tablet Dose change  . cinacalcet (SENSIPAR) 60 MG tablet Discontinued by provider  . terbinafine (LAMISIL) 250 MG tablet Discontinued by provider  . albuterol (PROVENTIL) (2.5 MG/3ML) 0.083% nebulizer solution 5 mg   . moxifloxacin (AVELOX) 400 MG tablet   . albuterol (PROVENTIL,VENTOLIN) solution continuous neb   . nitroGLYCERIN (NITROSTAT) SL tablet 0.4 mg   . oxyCODONE-acetaminophen (PERCOCET) 10-325 MG per tablet 1 tablet Formulary change  . ondansetron (ZOFRAN) injection 4 mg Duplicate  . ondansetron (ZOFRAN) tablet 4 mg  Duplicate    Social History:  reports that she quit smoking about 22 years ago. Her smoking use included Cigarettes. She has never used smokeless tobacco. She reports that she uses illicit drugs (Marijuana). She reports that she does not drink alcohol.  Family History:   Family History  Problem Relation Age of Onset  . Diabetes Mother   . Hypertension Mother   . Heart attack Mother   . Diabetes Father   . Hypertension Father     Blood pressure 123/63, pulse 87, temperature 97.9 F (36.6 C), temperature source Oral, resp. rate 26, height 5' 2"  (1.575 m), weight 98.6 kg (217 lb 6 oz), SpO2 100 %. General appearance: mild distress Head: Normocephalic, without obvious abnormality, atraumatic Eyes: negative Neck: no adenopathy, no carotid bruit, no JVD, supple, symmetrical, trachea midline and thyroid not enlarged, symmetric, no tenderness/mass/nodules Back: symmetric, no curvature. ROM normal. No CVA tenderness. Resp: rales at bases only Chest wall: no tenderness Cardio: regular rate and rhythm, S1, S2 normal, no murmur, click, rub or gallop GI: decreased freq of BS Extremities: extremities normal, atraumatic, no cyanosis or edema Pulses: 2+ and symmetric Skin: Skin color, texture,  turgor normal. No rashes or lesions Lymph nodes: Cervical, supraclavicular, and axillary nodes normal. Neurologic: Mental status: Alert, oriented, thought content appropriate, lethargic       LIN, Hunt Oris, MD 11/04/2015, 7:33 AM

## 2015-11-04 NOTE — Progress Notes (Signed)
RT called to patient beside. Patient desat into the 6s on 2L [place]. RN placed patient on NRB. Patient sat in the 80s. RT placed patient back on BIPAP 16/8 and 70%. Patient tolerating well sat 95 to 96%. RT will continue to monitor.

## 2015-11-04 NOTE — Progress Notes (Addendum)
Patient Demographics:    [name], is a 55 y.o. female, DOB - [birth year], HR:[id number]  Admit date - 11/03/2015   Admitting Physician Edwin Dada, MD  Outpatient Primary MD for the patient is Ricke Hey, MD  LOS - 1   Chief Complaint  Patient presents with  . Shortness of Breath        Subjective:    [name] today has, No headache, No chest pain, No abdominal pain , No new weakness tingling or numbness, No Cough - Improved SOB and nausea.   Assessment  & Plan :     1. Acute hypoxic respiratory failure requiring BiPAP/nonrebreather mask. Due to fluid overload and a ESRD patient, EF of 60% on echogram in January 2016 without any diastolic dysfunction. This at best would be acute nonspecific CHF.  No fever or leukocytosis. Much improved after 3 hours of dialysis, currently resting comfortably, will likely require another dialysis run tomorrow for further fluid removal. Goal is to keep her between 3-4 L negative today. We'll repeat chest x-ray after dialysis and monitor. Note she was admitted one month ago for pneumonia at [facility]. We will continue to monitor for any signs of infection. Repeat TTE pending.  Addendum - 12.20 pm was paged by the RN temp 101.6, will get Blood and sputum cultures, Vanco + Zosyn, repeat CXR.   2. ESRD. Renal on board, being dialyzed, on Tuesday, Thursday and Saturday schedule. Continue dialysis may require an extra run.  3. Hypertensive urgency. Home medications adjusted, have increased Catapres dose, continue beta blocker & Nifedipine, Off NTG drip, as needed IV hydralazine. Blood pressures improved. Likely due to #1 above.  4. Hyperkalemia. Treated in the ER and now being dialyzed.  5. Dyslipidemia. Continue home dose statin.  6. GERD.  On PPI continue.  7. Diabetic neuropathy. On Pregablin continue.  8. Mild nausea. Much improved. Check KUB supportive care.   9.DM type II on Lantus and sliding scale continue to monitor.  No results found for: HGBA1C CBG (last 3)   Recent Labs  11/04/15 0816  GLUCAP 125*        Code Status : Full  Family Communication  : None present  Disposition Plan  : Stepdown  Consults  :  Renal  Procedures  : HD, TTE pending  DVT Prophylaxis  :  Lovenox   Lab Results  Component Value Date   PLT 110* 11/04/2015    Inpatient Medications  Scheduled Meds: . aspirin  81 mg Oral Daily  . cloNIDine  0.2 mg Oral TID  . enoxaparin (LOVENOX) injection  30 mg Subcutaneous Q24H  . heparin  4,000 Units Dialysis Once in dialysis  . insulin aspart  0-5 Units Subcutaneous QHS  . insulin aspart  0-9 Units Subcutaneous TID WC  . insulin glargine  10 Units Subcutaneous Daily  . levothyroxine  125 mcg Oral QAC breakfast  . metoprolol succinate  100 mg Oral Q supper  . NIFEdipine  60 mg Oral BID  . pantoprazole  80 mg Oral Q1200  . pravastatin  40 mg Oral QHS  . pregabalin  75 mg Oral BID  . sevelamer carbonate  1,600 mg Oral TID WC   Continuous Infusions: . nitroGLYCERIN Stopped (11/04/15 0720)  PRN Meds:.sodium chloride, sodium chloride, albuterol, alteplase, heparin, hydrALAZINE, lidocaine (PF), lidocaine-prilocaine, [DISCONTINUED] ondansetron **OR** ondansetron (ZOFRAN) IV, [DISCONTINUED] oxyCODONE-acetaminophen **AND** oxyCODONE, pentafluoroprop-tetrafluoroeth, sevelamer carbonate  Antibiotics  :     Anti-infectives    None        Objective:   Filed Vitals:   11/04/15 0815 11/04/15 0830 11/04/15 0845 11/04/15 0900  BP: 134/67 133/72 135/65 167/82  Pulse: 86 89 88 91  Temp:      TempSrc:      Resp: 30 27 30  33  Height:      Weight:    94.5 kg (208 lb 5.4 oz)  SpO2: 96% 96% 94% 96%    Wt Readings from Last 3 Encounters:  11/04/15 94.5 kg (208 lb 5.4 oz)   06/11/15 96.843 kg (213 lb 8 oz)  06/04/15 93.895 kg (207 lb)     Intake/Output Summary (Last 24 hours) at 11/04/15 1018 Last data filed at 11/04/15 0900  Gross per 24 hour  Intake  21.69 ml  Output   3095 ml  Net -3073.31 ml     Physical Exam  Awake Alert, Oriented X 3, No new F.N deficits, Normal affect [place].AT,PERRAL Supple Neck,No JVD, No cervical lymphadenopathy appriciated.  Symmetrical Chest wall movement, Good air movement bilaterally, bilateral rales RRR,No Gallops,Rubs or new Murmurs, No Parasternal Heave +ve B.Sounds, Abd Soft, No tenderness, No organomegaly appriciated, No rebound - guarding or rigidity. No Cyanosis, Clubbing or edema, No new Rash or bruise       Data Review:   Micro Results Recent Results (from the past 240 hour(s))  MRSA PCR Screening     Status: None   Collection Time: 11/04/15  3:11 AM  Result Value Ref Range Status   MRSA by PCR NEGATIVE NEGATIVE Final    Comment:        The GeneXpert MRSA Assay (FDA approved for NASAL specimens only), is one component of a comprehensive MRSA colonization surveillance program. It is not intended to diagnose MRSA infection nor to guide or monitor treatment for MRSA infections.     Radiology Reports Dg Chest Portable 1 View  11/03/2015  CLINICAL DATA:  56 year old female with persistent progressive productive cough and sudden onset shortness of breath despite being on antibiotics for recent diagnosis of pneumonia EXAM: PORTABLE CHEST 1 VIEW COMPARISON:  Prior chest x-ray 06/10/15 FINDINGS: Stable cardiac and mediastinal contours. Diffuse bilateral interstitial and airspace opacities significantly increased compared to prior imaging. No pneumothorax or definite large pleural effusion. Long segment stent overlies the left shoulder girdle. This is likely within the venous system and related to an underlying hemodialysis access. No acute osseous abnormality. IMPRESSION: Mild cardiomegaly with diffuse  bilateral interstitial and airspace opacities. The imaging appearance is most suggestive of moderate CHF versus volume overload. Additional differential considerations include atypical or viral pneumonia, less likely multi lobar pneumonia or ARDS. Electronically Signed   By: Jacqulynn Cadet M.D.   On: 11/03/2015 17:10     CBC  Recent Labs Lab 11/03/15 1940 11/04/15 0500  WBC 8.7 8.2  HGB 13.9 11.6*  HCT 43.3 36.0  PLT 120* 110*  MCV 75.6* 74.8*  MCH 24.3* 24.1*  MCHC 32.1 32.2  RDW 19.5* 19.1*  LYMPHSABS 1.6  --   MONOABS 0.4  --   EOSABS 0.0  --   BASOSABS 0.0  --     Chemistries   Recent Labs Lab 11/03/15 1940 11/04/15 0500  NA 138 139  K 6.4* 5.2*  CL 97* 101  CO2 22 22  GLUCOSE 99 120*  BUN 60* 62*  CREATININE 10.44* 10.84*  CALCIUM 8.9 8.4*  AST 32  --   ALT 32  --   ALKPHOS 207*  --   BILITOT 0.6  --    ------------------------------------------------------------------------------------------------------------------ No results for input(s): CHOL, HDL, LDLCALC, TRIG, CHOLHDL, LDLDIRECT in the last 72 hours.  No results found for: HGBA1C ------------------------------------------------------------------------------------------------------------------ No results for input(s): TSH, T4TOTAL, T3FREE, THYROIDAB in the last 72 hours.  Invalid input(s): FREET3 ------------------------------------------------------------------------------------------------------------------ No results for input(s): VITAMINB12, FOLATE, FERRITIN, TIBC, IRON, RETICCTPCT in the last 72 hours.  Coagulation profile No results for input(s): INR, PROTIME in the last 168 hours.  No results for input(s): DDIMER in the last 72 hours.  Cardiac Enzymes  Recent Labs Lab 11/04/15 0500  TROPONINI 0.14*   ------------------------------------------------------------------------------------------------------------------    Component Value Date/Time   BNP 822.1* 11/03/2015 1940     Time Spent in minutes  35   SINGH,PRASHANT K M.D on 11/04/2015 at 10:18 AM  Between 7am to 7pm - Pager - [phone]  After 7pm go to [email or web address] - password [facility]  [facility] -  Office  [phone]

## 2015-11-04 NOTE — Progress Notes (Signed)
Hemodialysis- Pt tolerated treatment well. 3L UF per orders. Pt had episode of nausea during first hour hd, has since subsided. Pt currently sleeping. Report given to primary RN.

## 2015-11-04 NOTE — Care Management Obs Status (Signed)
[place] NOTIFICATION   Patient Details  Name: [name] MRN: [id number] Date of Birth: 1961-03-19   Medicare Observation Status Notification Given:  Yes  Code 57 done  Lacretia Leigh, RN 11/04/2015, 2:03 PM

## 2015-11-05 ENCOUNTER — Other Ambulatory Visit (HOSPITAL_COMMUNITY): Payer: Medicare Other

## 2015-11-05 DIAGNOSIS — I10 Essential (primary) hypertension: Secondary | ICD-10-CM

## 2015-11-05 DIAGNOSIS — Z992 Dependence on renal dialysis: Secondary | ICD-10-CM

## 2015-11-05 DIAGNOSIS — E1122 Type 2 diabetes mellitus with diabetic chronic kidney disease: Secondary | ICD-10-CM

## 2015-11-05 DIAGNOSIS — E119 Type 2 diabetes mellitus without complications: Secondary | ICD-10-CM

## 2015-11-05 DIAGNOSIS — Z794 Long term (current) use of insulin: Secondary | ICD-10-CM

## 2015-11-05 DIAGNOSIS — N186 End stage renal disease: Secondary | ICD-10-CM

## 2015-11-05 DIAGNOSIS — J9601 Acute respiratory failure with hypoxia: Secondary | ICD-10-CM

## 2015-11-05 LAB — HEPATITIS B SURFACE ANTIGEN: HEP B S AG: NEGATIVE

## 2015-11-05 LAB — BASIC METABOLIC PANEL
ANION GAP: 14 (ref 5–15)
BUN: 40 mg/dL — ABNORMAL HIGH (ref 6–20)
CO2: 26 mmol/L (ref 22–32)
Calcium: 8.1 mg/dL — ABNORMAL LOW (ref 8.9–10.3)
Chloride: 96 mmol/L — ABNORMAL LOW (ref 101–111)
Creatinine, Ser: 8.85 mg/dL — ABNORMAL HIGH (ref 0.44–1.00)
GFR calc Af Amer: 5 mL/min — ABNORMAL LOW (ref >60)
GFR calc non Af Amer: 4 mL/min — ABNORMAL LOW (ref >60)
GLUCOSE: 174 mg/dL — AB (ref 65–99)
POTASSIUM: 5.3 mmol/L — AB (ref 3.5–5.1)
Sodium: 136 mmol/L (ref 135–145)

## 2015-11-05 LAB — GLUCOSE, CAPILLARY
GLUCOSE-CAPILLARY: 180 mg/dL — AB (ref 65–99)
GLUCOSE-CAPILLARY: 144 mg/dL — AB (ref 65–99)
Glucose-Capillary: 122 mg/dL — ABNORMAL HIGH (ref 65–99)

## 2015-11-05 MED ORDER — VANCOMYCIN HCL IN DEXTROSE 1-5 GM/200ML-% IV SOLN
1000.0000 mg | INTRAVENOUS | Status: DC
  Administered 2015-11-06: 1000 mg via INTRAVENOUS
  Filled 2015-11-05 (×3): qty 200

## 2015-11-05 MED ORDER — VANCOMYCIN HCL IN DEXTROSE 1-5 GM/200ML-% IV SOLN
INTRAVENOUS | Status: AC
  Administered 2015-11-05: 1 g via INTRAVENOUS
  Filled 2015-11-05: qty 200

## 2015-11-05 MED ORDER — VANCOMYCIN HCL IN DEXTROSE 1-5 GM/200ML-% IV SOLN
1000.0000 mg | Freq: Once | INTRAVENOUS | Status: AC
  Administered 2015-11-05: 1 g via INTRAVENOUS
  Filled 2015-11-05: qty 200

## 2015-11-05 MED ORDER — BENZONATATE 100 MG PO CAPS
100.0000 mg | ORAL_CAPSULE | Freq: Three times a day (TID) | ORAL | Status: DC | PRN
  Administered 2015-11-05 – 2015-11-06 (×2): 100 mg via ORAL
  Filled 2015-11-05 (×2): qty 1

## 2015-11-05 NOTE — Progress Notes (Signed)
New Admission Note:   Arrival Method: bed Mental Orientation: a/o x4 Telemetry: placed Assessment: Completed Skin: clean dry intact IV: LH SL Pain: none Tubes: none Safety Measures: Safety Fall Prevention Plan has been given, discussed and signed Admission: Completed Unit Orientation: Patient has been orientated to the room, unit and staff.  Family: n/a  Orders have been reviewed and implemented. Will continue to monitor the patient. Call light has been placed within reach and bed alarm has been activated.   Retta Mac BSN, RN

## 2015-11-05 NOTE — Progress Notes (Signed)
[facility] PROGRESS NOTE  [name] [id number] DOB: October 17, 1960 DOA: 11/03/2015 PCP: Ricke Hey, MD  HPI/Brief narrative Please see admit h and p from 2/13 for details. Briefly, 55 y.o. female with a past medical history significant for ESRD on HD TThS, IDDM, hypothyroidism, and HTN who presents with dyspnea, patient admitted with concerns for acute volume overload  Assessment/Plan: 1. Acute hypoxic respiratory failure requiring BiPAP/nonrebreather mask. Due to fluid overload in a ESRD patient, EF of 60% on echogram in January 2016 without any diastolic dysfunction. This at rest would be acute nonspecific CHF. Therefore no CHF. Much improved after dialysis, currently resting comfortably. Patient recently febrile and started on empiric vanc and zosyn. CXR with questionable findings of edema vs infiltrate. Currently afebrile. Anticipate de-escalating abx soon  2. ESRD. Renal on board, being dialyzed, on Tuesday, Thursday and Saturday schedule. Continue dialysis   3. Hypertensive urgency. Home medications adjusted, have increased Catapres dose, continue beta blocker & Nifedipine, Off NTG drip, as needed IV hydralazine. Blood pressures improved. Likely due to #1 above.  4. Hyperkalemia. Treated in the ER and now being dialyzed.  5. Dyslipidemia. Continue home dose statin.  6. GERD. On PPI continue.  7. Diabetic neuropathy. On Pregablin continue.  8. Mild nausea. Much improved. Pt constipated. Pt with BM overnight.   9.DM type II on Lantus and sliding scale continue to monitor.  Code Status: full Family Communication: Pt in room Disposition Plan: Possible d/c home in 24-48hrs   Consultants:  Nephrology  Procedures:    Antibiotics: Anti-infectives    Start     Dose/Rate Route Frequency Ordered Stop   11/06/15 1200  vancomycin (VANCOCIN) IVPB 1000 mg/200 mL premix     1,000 mg 200 mL/hr over 60 Minutes Intravenous Every T-Th-Sa (Hemodialysis) 11/05/15 1031      11/05/15 1600  vancomycin (VANCOCIN) IVPB 1000 mg/200 mL premix     1,000 mg 200 mL/hr over 60 Minutes Intravenous  Once 11/05/15 1031     11/04/15 1400  piperacillin-tazobactam (ZOSYN) IVPB 2.25 g     2.25 g 100 mL/hr over 30 Minutes Intravenous 3 times per day 11/04/15 1313     11/04/15 1400  vancomycin (VANCOCIN) 1,500 mg in sodium chloride 0.9 % 250 mL IVPB     1,500 mg 250 mL/hr over 60 Minutes Intravenous  Once 11/04/15 1314 11/04/15 1518       HPI/Subjective: Feels better today. Questioning about going home  Objective: Filed Vitals:   11/05/15 0414 11/05/15 0812 11/05/15 1003 11/05/15 1010  BP:  146/54 115/55 115/55  Pulse: 90 81    Temp:  99.5 F (37.5 C)    TempSrc:  Axillary    Resp: 16 21    Height:      Weight:      SpO2: 98% 100%      Intake/Output Summary (Last 24 hours) at 11/05/15 1038 Last data filed at 11/05/15 0600  Gross per 24 hour  Intake    150 ml  Output      0 ml  Net    150 ml   Filed Weights   11/04/15 0455 11/04/15 0900 11/05/15 0408  Weight: 98.6 kg (217 lb 6 oz) 94.5 kg (208 lb 5.4 oz) 95.664 kg (210 lb 14.4 oz)    Exam:   General:  Awake, in nad  Cardiovascular: regular, s1, s2  Respiratory: normal resp effort, no wheezing  Abdomen: soft,nondistended  Musculoskeletal: perfused, no clubbing   Data Reviewed: Basic Metabolic Panel:  Recent Labs Lab 11/03/15 1940 11/04/15 0500 11/05/15 0300  NA 138 139 136  K 6.4* 5.2* 5.3*  CL 97* 101 96*  CO2 22 22 26   GLUCOSE 99 120* 174*  BUN 60* 62* 40*  CREATININE 10.44* 10.84* 8.85*  CALCIUM 8.9 8.4* 8.1*  PHOS  --  3.7  --    Liver Function Tests:  Recent Labs Lab 11/03/15 1940 11/04/15 0500  AST 32  --   ALT 32  --   ALKPHOS 207*  --   BILITOT 0.6  --   PROT 8.3*  --   ALBUMIN 3.6 2.6*   No results for input(s): LIPASE, AMYLASE in the last 168 hours. No results for input(s): AMMONIA in the last 168 hours. CBC:  Recent Labs Lab 11/03/15 1940  11/04/15 0500  WBC 8.7 8.2  NEUTROABS 6.6  --   HGB 13.9 11.6*  HCT 43.3 36.0  MCV 75.6* 74.8*  PLT 120* 110*   Cardiac Enzymes:  Recent Labs Lab 11/04/15 0500 11/04/15 1416 11/04/15 1900  TROPONINI 0.14* 0.12* 0.08*   BNP (last 3 results)  Recent Labs  11/03/15 1940  BNP 822.1*    ProBNP (last 3 results) No results for input(s): PROBNP in the last 8760 hours.  CBG:  Recent Labs Lab 11/04/15 0816 11/04/15 1141 11/04/15 1711 11/04/15 2135 11/05/15 0815  GLUCAP 125* 143* 211* 196* 180*    Recent Results (from the past 240 hour(s))  MRSA PCR Screening     Status: None   Collection Time: 11/04/15  3:11 AM  Result Value Ref Range Status   MRSA by PCR NEGATIVE NEGATIVE Final    Comment:        The GeneXpert MRSA Assay (FDA approved for NASAL specimens only), is one component of a comprehensive MRSA colonization surveillance program. It is not intended to diagnose MRSA infection nor to guide or monitor treatment for MRSA infections.      Studies: Dg Chest Port 1 View  11/04/2015  CLINICAL DATA:  Productive cough worsening since Monday. Shortness of breath EXAM: PORTABLE CHEST 1 VIEW COMPARISON:  11/03/2015 FINDINGS: Bilateral predominately perihilar airspace opacities are noted, right greater than left. There is mild cardiomegaly. Low lung volumes. No effusions or acute bony abnormality. IMPRESSION: Bilateral perihilar airspace opacities, right greater than left. This could represent asymmetric edema or infection. Electronically Signed   By: Rolm Baptise M.D.   On: 11/04/2015 11:11   Dg Chest Portable 1 View  11/03/2015  CLINICAL DATA:  55 year old female with persistent progressive productive cough and sudden onset shortness of breath despite being on antibiotics for recent diagnosis of pneumonia EXAM: PORTABLE CHEST 1 VIEW COMPARISON:  Prior chest x-ray 06/10/15 FINDINGS: Stable cardiac and mediastinal contours. Diffuse bilateral interstitial and airspace  opacities significantly increased compared to prior imaging. No pneumothorax or definite large pleural effusion. Long segment stent overlies the left shoulder girdle. This is likely within the venous system and related to an underlying hemodialysis access. No acute osseous abnormality. IMPRESSION: Mild cardiomegaly with diffuse bilateral interstitial and airspace opacities. The imaging appearance is most suggestive of moderate CHF versus volume overload. Additional differential considerations include atypical or viral pneumonia, less likely multi lobar pneumonia or ARDS. Electronically Signed   By: Jacqulynn Cadet M.D.   On: 11/03/2015 17:10   Dg Abd Portable 1v  11/04/2015  CLINICAL DATA:  Nausea EXAM: PORTABLE ABDOMEN - 1 VIEW COMPARISON:  None. FINDINGS: Scattered large and small bowel gas is noted. Fecal material is noted  throughout the colon consistent with a mild degree of constipation. No abnormal mass is noted. The bony structures are within normal limits. IMPRESSION: Mild constipation. Electronically Signed   By: Inez Catalina M.D.   On: 11/04/2015 11:16    Scheduled Meds: . aspirin  81 mg Oral Daily  . bisacodyl  10 mg Rectal Daily  . cloNIDine  0.2 mg Oral TID  . docusate sodium  200 mg Oral BID  . enoxaparin (LOVENOX) injection  30 mg Subcutaneous Q24H  . heparin  4,000 Units Dialysis Once in dialysis  . insulin aspart  0-5 Units Subcutaneous QHS  . insulin aspart  0-9 Units Subcutaneous TID WC  . insulin glargine  10 Units Subcutaneous Daily  . levothyroxine  125 mcg Oral QAC breakfast  . metoprolol succinate  100 mg Oral Q supper  . NIFEdipine  60 mg Oral BID  . pantoprazole  80 mg Oral Q1200  . piperacillin-tazobactam (ZOSYN)  IV  2.25 g Intravenous 3 times per day  . pravastatin  40 mg Oral QHS  . pregabalin  75 mg Oral BID  . sevelamer carbonate  1,600 mg Oral TID WC  . vancomycin  1,000 mg Intravenous Once  . [START ON 11/06/2015] vancomycin  1,000 mg Intravenous Q  T,Th,Sa-HD   Continuous Infusions: . nitroGLYCERIN Stopped (11/04/15 0720)    Active Problems:   Essential hypertension: Associated with renal disease   Acute respiratory failure with hypoxia (HCC)   End stage renal disease on dialysis due to type 2 diabetes mellitus ([place])   Diabetes mellitus, type II, insulin dependent ([place])   Hypothyroidism (acquired)   Diabetic peripheral neuropathy associated with type 2 diabetes mellitus ([place])   Acute hypoxemic respiratory failure ([place])   [facility], [facility] Pager [phone]. If 7PM-7AM, please contact night-coverage at [email or web address], password [facility] 11/05/2015, 10:38 AM  LOS: 2 days

## 2015-11-05 NOTE — Procedures (Signed)
I have personally attended this patient's dialysis session.   2K bath for K 5.3 Tolerating UF so far with 3 liter goal and stable BP L AVF 400  Jamal Maes, MD [place] Pager 11/05/2015, 12:22 PM

## 2015-11-05 NOTE — Progress Notes (Signed)
[facility] Rounding Note Subjective:  Tolerated HD yesterday O2 weaned to NRB and then to nasal cannnula Still coughing and now says has coughed up some blood Now under stated EDW of 96.5 (94.5 kg post HD, 95.6 today) Got just over 3 liters of with HD Low grade fevers past 24 hours  Objective Vital signs in last 24 hours: Filed Vitals:   11/05/15 0018 11/05/15 0408 11/05/15 0414 11/05/15 0812  BP: 140/49 155/56  146/54  Pulse: 81  90 81  Temp: 98.5 F (36.9 C) 98.4 F (36.9 C)  99.5 F (37.5 C)  TempSrc: Oral Oral  Axillary  Resp: 27 12 16 21   Height:      Weight:  95.664 kg (210 lb 14.4 oz)    SpO2: 98% 99% 98% 100%   Weight change: -3.023 kg (-6 lb 10.6 oz)  Intake/Output Summary (Last 24 hours) at 11/05/15 0955 Last data filed at 11/05/15 0600  Gross per 24 hour  Intake    150 ml  Output      0 ml  Net    150 ml   Physical Exam:  BP 146/54 mmHg  Pulse 81  Temp(Src) 99.5 F (37.5 C) (Axillary)  Resp 21  Ht 5\' 2"  (1.575 m)  Wt 95.664 kg (210 lb 14.4 oz)  BMI 38.56 kg/m2  SpO2 100% NAD at this time.  Comfortable on nasal cannula Neck: No JVD Resp: Breath sounds are coarse Cardio: regular rate and rhythm, S1, S2 normal, GI: + BS non tender No sig edema of the lower extremities Left upper arm AVR + bruit  Labs: Basic Metabolic Panel:  Recent Labs Lab 11/03/15 1940 11/04/15 0500 11/05/15 0300  NA 138 139 136  K 6.4* 5.2* 5.3*  CL 97* 101 96*  CO2 22 22 26   GLUCOSE 99 120* 174*  BUN 60* 62* 40*  CREATININE 10.44* 10.84* 8.85*  CALCIUM 8.9 8.4* 8.1*  PHOS  --  3.7  --    Liver Function Tests:  Recent Labs Lab 11/03/15 1940 11/04/15 0500  AST 32  --   ALT 32  --   ALKPHOS 207*  --   BILITOT 0.6  --   PROT 8.3*  --   ALBUMIN 3.6 2.6*   CBC:  Recent Labs Lab 11/03/15 1940 11/04/15 0500  WBC 8.7 8.2  NEUTROABS 6.6  --   HGB 13.9 11.6*  HCT 43.3 36.0  MCV 75.6* 74.8*  PLT 120* 110*     Recent Labs Lab 11/04/15 0500 11/04/15 1416  11/04/15 1900  TROPONINI 0.14* 0.12* 0.08*   CBG:  Recent Labs Lab 11/04/15 0816 11/04/15 1141 11/04/15 1711 11/04/15 2135 11/05/15 0815  GLUCAP 125* 143* 211* 196* 180*    Studies/Results: Dg Chest Port 1 View  11/04/2015  CLINICAL DATA:  Productive cough worsening since Monday. Shortness of breath EXAM: PORTABLE CHEST 1 VIEW COMPARISON:  11/03/2015 FINDINGS: Bilateral predominately perihilar airspace opacities are noted, right greater than left. There is mild cardiomegaly. Low lung volumes. No effusions or acute bony abnormality. IMPRESSION: Bilateral perihilar airspace opacities, right greater than left. This could represent asymmetric edema or infection. Electronically Signed   By: Rolm Baptise M.D.   On: 11/04/2015 11:11   Dg Chest Portable 1 View  11/03/2015  CLINICAL DATA:  55 year old female with persistent progressive productive cough and sudden onset shortness of breath despite being on antibiotics for recent diagnosis of pneumonia EXAM: PORTABLE CHEST 1 VIEW COMPARISON:  Prior chest x-ray 06/10/15 FINDINGS: Stable cardiac and  mediastinal contours. Diffuse bilateral interstitial and airspace opacities significantly increased compared to prior imaging. No pneumothorax or definite large pleural effusion. Long segment stent overlies the left shoulder girdle. This is likely within the venous system and related to an underlying hemodialysis access. No acute osseous abnormality. IMPRESSION: Mild cardiomegaly with diffuse bilateral interstitial and airspace opacities. The imaging appearance is most suggestive of moderate CHF versus volume overload. Additional differential considerations include atypical or viral pneumonia, less likely multi lobar pneumonia or ARDS. Electronically Signed   By: Jacqulynn Cadet M.D.   On: 11/03/2015 17:10   Dg Abd Portable 1v  11/04/2015  CLINICAL DATA:  Nausea EXAM: PORTABLE ABDOMEN - 1 VIEW COMPARISON:  None. FINDINGS: Scattered large and small bowel gas  is noted. Fecal material is noted throughout the colon consistent with a mild degree of constipation. No abnormal mass is noted. The bony structures are within normal limits. IMPRESSION: Mild constipation. Electronically Signed   By: Inez Catalina M.D.   On: 11/04/2015 11:16   Medications: . nitroGLYCERIN Stopped (11/04/15 0720)   . aspirin  81 mg Oral Daily  . bisacodyl  10 mg Rectal Daily  . cloNIDine  0.2 mg Oral TID  . docusate sodium  200 mg Oral BID  . enoxaparin (LOVENOX) injection  30 mg Subcutaneous Q24H  . heparin  4,000 Units Dialysis Once in dialysis  . insulin aspart  0-5 Units Subcutaneous QHS  . insulin aspart  0-9 Units Subcutaneous TID WC  . insulin glargine  10 Units Subcutaneous Daily  . levothyroxine  125 mcg Oral QAC breakfast  . metoprolol succinate  100 mg Oral Q supper  . NIFEdipine  60 mg Oral BID  . pantoprazole  80 mg Oral Q1200  . piperacillin-tazobactam (ZOSYN)  IV  2.25 g Intravenous 3 times per day  . pravastatin  40 mg Oral QHS  . pregabalin  75 mg Oral BID  . sevelamer carbonate  1,600 mg Oral TID WC   Background 55 y.o. female with a h/o DM, HTN and ESRD TTS regimen with home unit in [place], recently a transient at the [facility] unit with Dr. Marval Regal, last HD PTA on 11/01/2015 @ which time she left dialysis 0.6 kg over her EDW of 96.5kg. Admitted a month ago with PNA requiring intubation but she completed a full course of antibiotics and was back to her baseline state of health. Sunday she started to have a cough with productive sputum which worsened Monday with ensuing dyspnea as well. She was found to have findings c/w pulmonary edema on CXR in the ED with no obvious infiltrate, started on BiPAP + nitroglycerin gtt for hypertensive urgency. She required urgent dialysis AM 2/14 because of a low O2 sat despite supplemental O2   Assessment/Plan: 1. CHF - decompensated with ? of infiltrate on CXR, (recently admitted in [place] with PNA but completed a  full course of antibiotics). Sats in the high 80's and low 90's prior to initiating dialysis 2/14. 3 liters off with HD 2/14. Now below previous EDW. Post HD CXR yesterday still with perihilar air space ds - edema vs infiltrate - and she is still coughing (having blood tinged sputum now). I think have to get her lungs dried out a little more to discern if evidence PNA. She is agreeable to another TMT today.  2. Fever - ? Recurrent PNA (just treated for same 1 month ago) Temp to 101.6 yesterday, today low grade. On Zosyn and vanco. Cultures pending. 3. ESRD -  last HD PTA was  Sat 11/01/15 (usually on TTS regimen) home unit in [place], transient at the [facility] unit with Dr. Marval Regal. Tolerated HD yesterday  3 liters off.  HD again today for volume and again tomorrow to get back on usual schedule.  4. Hypertensive urgency - certainly may have an element of demand ischemia as well but feeling better now that her BP is better + UF. Hold BP meds pre HD today 5. Hyperkalemia - improved after cocktail + HD. K 5.3 today  6. IDDM  Jamal Maes, MD [facility] [phone] pager 11/05/2015, 9:55 AM

## 2015-11-05 NOTE — Clinical Documentation Improvement (Signed)
Internal Medicine  Can the diagnosis of CHF be further specified by type and acuity? Thank you     Acuity - Acute, Chronic, Acute on Chronic   Type - Systolic, Diastolic, Systolic and Diastolic  Other  Clinically Undetermined   Document any associated diagnoses/conditions   Supporting Information: BNP 822.1   Please exercise your independent, professional judgment when responding. A specific answer is not anticipated or expected.   Thank You,  [place] [phone]

## 2015-11-06 ENCOUNTER — Inpatient Hospital Stay (HOSPITAL_COMMUNITY): Payer: Medicare Other

## 2015-11-06 DIAGNOSIS — R000 Tachycardia, unspecified: Secondary | ICD-10-CM

## 2015-11-06 DIAGNOSIS — I509 Heart failure, unspecified: Secondary | ICD-10-CM

## 2015-11-06 LAB — BASIC METABOLIC PANEL
Anion gap: 13 (ref 5–15)
BUN: 28 mg/dL — ABNORMAL HIGH (ref 6–20)
CO2: 26 mmol/L (ref 22–32)
Calcium: 8.5 mg/dL — ABNORMAL LOW (ref 8.9–10.3)
Chloride: 96 mmol/L — ABNORMAL LOW (ref 101–111)
Creatinine, Ser: 7.17 mg/dL — ABNORMAL HIGH (ref 0.44–1.00)
GFR calc Af Amer: 7 mL/min — ABNORMAL LOW (ref >60)
GFR calc non Af Amer: 6 mL/min — ABNORMAL LOW (ref >60)
Glucose, Bld: 106 mg/dL — ABNORMAL HIGH (ref 65–99)
Potassium: 4.4 mmol/L (ref 3.5–5.1)
Sodium: 135 mmol/L (ref 135–145)

## 2015-11-06 LAB — GLUCOSE, CAPILLARY
GLUCOSE-CAPILLARY: 109 mg/dL — AB (ref 65–99)
GLUCOSE-CAPILLARY: 98 mg/dL (ref 65–99)
GLUCOSE-CAPILLARY: 146 mg/dL — AB (ref 65–99)

## 2015-11-06 LAB — CBC
HEMATOCRIT: 33.9 % — AB (ref 36.0–46.0)
HEMOGLOBIN: 10.7 g/dL — AB (ref 12.0–15.0)
MCH: 23.3 pg — AB (ref 26.0–34.0)
MCHC: 31.6 g/dL (ref 30.0–36.0)
MCV: 73.9 fL — ABNORMAL LOW (ref 78.0–100.0)
Platelets: 96 10*3/uL — ABNORMAL LOW (ref 150–400)
RBC: 4.59 MIL/uL (ref 3.87–5.11)
RDW: 18.6 % — AB (ref 11.5–15.5)
WBC: 5.9 10*3/uL (ref 4.0–10.5)

## 2015-11-06 LAB — RENAL FUNCTION PANEL
ALBUMIN: 3 g/dL — AB (ref 3.5–5.0)
ANION GAP: 14 (ref 5–15)
BUN: 23 mg/dL — AB (ref 6–20)
CO2: 27 mmol/L (ref 22–32)
Calcium: 8.8 mg/dL — ABNORMAL LOW (ref 8.9–10.3)
Chloride: 96 mmol/L — ABNORMAL LOW (ref 101–111)
Creatinine, Ser: 6.63 mg/dL — ABNORMAL HIGH (ref 0.44–1.00)
GFR calc Af Amer: 7 mL/min — ABNORMAL LOW (ref >60)
GFR, EST NON AFRICAN AMERICAN: 6 mL/min — AB (ref >60)
GLUCOSE: 132 mg/dL — AB (ref 65–99)
PHOSPHORUS: 4 mg/dL (ref 2.5–4.6)
POTASSIUM: 4.2 mmol/L (ref 3.5–5.1)
SODIUM: 137 mmol/L (ref 135–145)

## 2015-11-06 MED ORDER — HEPARIN SODIUM (PORCINE) 1000 UNIT/ML DIALYSIS: 20.0000 [IU]/kg | INTRAMUSCULAR | Status: DC | PRN

## 2015-11-06 MED ORDER — LIDOCAINE HCL (PF) 1 % IJ SOLN: 5.0000 mL | INTRAMUSCULAR | Status: DC | PRN

## 2015-11-06 MED ORDER — METOPROLOL TARTRATE 12.5 MG HALF TABLET: 12.5000 mg | ORAL_TABLET | Freq: Two times a day (BID) | ORAL | Status: DC

## 2015-11-06 MED ORDER — SODIUM CHLORIDE 0.9 % IV SOLN: 100.0000 mL | INTRAVENOUS | Status: DC | PRN

## 2015-11-06 MED ORDER — ADENOSINE 6 MG/2ML IV SOLN
6.0000 mg | Freq: Once | INTRAVENOUS | Status: AC
  Administered 2015-11-06: 6 mg via INTRAVENOUS
  Filled 2015-11-06: qty 2

## 2015-11-06 MED ORDER — ZOLPIDEM TARTRATE 5 MG PO TABS
5.0000 mg | ORAL_TABLET | Freq: Once | ORAL | Status: AC
  Administered 2015-11-06: 5 mg via ORAL
  Filled 2015-11-06: qty 1

## 2015-11-06 MED ORDER — METOPROLOL TARTRATE 1 MG/ML IV SOLN
5.0000 mg | Freq: Once | INTRAVENOUS | Status: AC
  Administered 2015-11-06: 5 mg via INTRAVENOUS
  Filled 2015-11-06: qty 5

## 2015-11-06 MED ORDER — ALTEPLASE 2 MG IJ SOLR: 2.0000 mg | Freq: Once | INTRAMUSCULAR | Status: DC | PRN

## 2015-11-06 MED ORDER — PENTAFLUOROPROP-TETRAFLUOROETH EX AERO: 1.0000 "application " | INHALATION_SPRAY | CUTANEOUS | Status: DC | PRN

## 2015-11-06 MED ORDER — RENA-VITE PO TABS
1.0000 | ORAL_TABLET | Freq: Every day | ORAL | Status: DC
  Administered 2015-11-06 – 2015-11-08 (×3): 1 via ORAL
  Filled 2015-11-06 (×3): qty 1

## 2015-11-06 MED ORDER — LABETALOL HCL 5 MG/ML IV SOLN
5.0000 mg | INTRAVENOUS | Status: DC | PRN
  Administered 2015-11-06: 5 mg via INTRAVENOUS
  Filled 2015-11-06: qty 4

## 2015-11-06 MED ORDER — SODIUM CHLORIDE 0.9 % IV BOLUS (SEPSIS)
250.0000 mL | Freq: Once | INTRAVENOUS | Status: AC
  Administered 2015-11-06: 250 mL via INTRAVENOUS

## 2015-11-06 MED ORDER — HEPARIN SODIUM (PORCINE) 1000 UNIT/ML DIALYSIS: 1000.0000 [IU] | INTRAMUSCULAR | Status: DC | PRN

## 2015-11-06 MED ORDER — LIDOCAINE-PRILOCAINE 2.5-2.5 % EX CREA: 1.0000 "application " | TOPICAL_CREAM | CUTANEOUS | Status: DC | PRN

## 2015-11-06 NOTE — Significant Event (Signed)
Rapid Response Event Note  Overview:  Called to assist with patient with elevated HR Time Called: 0840 Arrival Time: 0845 Event Type: Cardiac  Initial Focused Assessment: On arrival patient supine in bed = awake and alert - warm and dry - states she has pressure in her chest and some SOB - moderate distress noted - Dr. Wyline Copas at bedside along with RN staff = has received 2 doses BB per MD report without effect - bil BS = clear - EKG shows SVT - patient states she has hx of this - BP 81/52 HR 153 RR 20 O2 sats 96% on 2 liters nasal cannula.  250 cc NS bolus infusing.    Interventions: Placed patient on Zoll pads and defib turned on - suction set up in room available = ambu bag to bedside along with crash cart - IV site checked - good blood return.  Dr. Wyline Copas at bedside - per his order  6 mg Adenosine IV pushed with 10cc fast NS flush - patient converted to NSR after short pause - rate 60 then to 95.  Patient states relief of pressure and SOB within minutes.  BP 115/50 HR 90 RR 16 O2 sats 95%.  0915 - Patient remains resting without pain or pressure - 109/52 HR 91 NSR RR 16 O2sats 96% on 2 liters.  Handoff to [employer] - to call as needed.    Event Summary: Name of Physician Notified: Dr. Wyline Copas at  (pta RRT)    at    Outcome: Stayed in room and stabalized  Event End Time: 0930  Quin Hoop

## 2015-11-06 NOTE — Progress Notes (Addendum)
[facility] PROGRESS NOTE  [name] [id number] DOB: 1961-06-14 DOA: 11/03/2015 PCP: Ricke Hey, MD  HPI/Brief narrative Please see admit h and p from 2/13 for details. Briefly, 55 y.o. female with a past medical history significant for ESRD on HD TThS, IDDM, hypothyroidism, and HTN who presents with dyspnea, patient admitted with concerns for acute volume overload  Assessment/Plan: 1. Acute hypoxic respiratory failure requiring BiPAP/nonrebreather mask. Due to fluid overload in a ESRD patient, EF of 60% on echogram in January 2016 without any diastolic dysfunction. This at rest would be acute nonspecific CHF. Therefore no CHF. Improved after dialysis. Patient recently febrile and continued on empiric vanc and zosyn. CXR with questionable findings of edema vs infiltrate. Currently afebrile. Anticipate de-escalating abx soon  2. ESRD. Renal on board, being dialyzed, on Tuesday, Thursday and Saturday schedule. Continue dialysis   3. Hypertensive urgency. Home medications adjusted, have increased Catapres dose, continue beta blocker & Nifedipine, Off NTG drip, as needed IV hydralazine. Blood pressures improved. Likely due to #1 above.  4. Hyperkalemia. Treated in the ER and now being dialyzed.  5. Dyslipidemia. Continue home dose statin.  6. GERD. On PPI continue.  7. Diabetic neuropathy. On Pregablin continue.  8. Mild nausea. Much improved. Pt constipated. Pt with BM overnight.   9.DM type II on Lantus and sliding scale continue to monitor.  10. Sinus tachycardia. HR to the 150's this AM. See nursing and RRT notation. Patient failed treatment with IV beta blocker x 2. Adenosine 6mg  ordered with RRT called to bedside for assistance. Patient placed on monitor and shortly after adenosine, HR quickly returned to NSR. Patient stabilized. Total critical care time spent stabilizing patient's uncontrolled HR 73min.  Code Status: full Family Communication: Pt in  room Disposition Plan: Possible d/c home in 24-48hrs   Consultants:  Nephrology  Procedures:    Antibiotics: Anti-infectives    Start     Dose/Rate Route Frequency Ordered Stop   11/06/15 1200  vancomycin (VANCOCIN) IVPB 1000 mg/200 mL premix     1,000 mg 200 mL/hr over 60 Minutes Intravenous Every T-Th-Sa (Hemodialysis) 11/05/15 1031     11/05/15 1600  vancomycin (VANCOCIN) IVPB 1000 mg/200 mL premix     1,000 mg 200 mL/hr over 60 Minutes Intravenous  Once 11/05/15 1031 11/05/15 1427   11/04/15 1400  piperacillin-tazobactam (ZOSYN) IVPB 2.25 g     2.25 g 100 mL/hr over 30 Minutes Intravenous 3 times per day 11/04/15 1313     11/04/15 1400  vancomycin (VANCOCIN) 1,500 mg in sodium chloride 0.9 % 250 mL IVPB     1,500 mg 250 mL/hr over 60 Minutes Intravenous  Once 11/04/15 1314 11/04/15 1518      HPI/Subjective: Increased HR this AM with sob  Objective: Filed Vitals:   11/06/15 0820 11/06/15 0839 11/06/15 0854 11/06/15 0900  BP: 110/59 76/42 81/52  115/50  Pulse: 155 156 155 90  Temp: 97.8 F (36.6 C)     TempSrc: Oral     Resp: 22     Height:      Weight:      SpO2: 92%       Intake/Output Summary (Last 24 hours) at 11/06/15 1043 Last data filed at 11/06/15 0600  Gross per 24 hour  Intake     50 ml  Output   3000 ml  Net  -2950 ml   Filed Weights   11/05/15 1104 11/05/15 1434 11/05/15 2205  Weight: 96.3 kg (212 lb 4.9 oz) 93.2 kg (  205 lb 7.5 oz) 94.2 kg (207 lb 10.8 oz)    Exam:   General:  Awake, appears tired  Cardiovascular: tachycardic, s1, s2  Respiratory: normal resp effort, no wheezing  Abdomen: soft,nondistended  Musculoskeletal: perfused, no clubbing   Data Reviewed: Basic Metabolic Panel:  Recent Labs Lab 11/03/15 1940 11/04/15 0500 11/05/15 0300 11/06/15 0600  NA 138 139 136 137  K 6.4* 5.2* 5.3* 4.2  CL 97* 101 96* 96*  CO2 22 22 26 27   GLUCOSE 99 120* 174* 132*  BUN 60* 62* 40* 23*  CREATININE 10.44* 10.84* 8.85* 6.63*   CALCIUM 8.9 8.4* 8.1* 8.8*  PHOS  --  3.7  --  4.0   Liver Function Tests:  Recent Labs Lab 11/03/15 1940 11/04/15 0500 11/06/15 0600  AST 32  --   --   ALT 32  --   --   ALKPHOS 207*  --   --   BILITOT 0.6  --   --   PROT 8.3*  --   --   ALBUMIN 3.6 2.6* 3.0*   No results for input(s): LIPASE, AMYLASE in the last 168 hours. No results for input(s): AMMONIA in the last 168 hours. CBC:  Recent Labs Lab 11/03/15 1940 11/04/15 0500 11/06/15 0600  WBC 8.7 8.2 5.9  NEUTROABS 6.6  --   --   HGB 13.9 11.6* 10.7*  HCT 43.3 36.0 33.9*  MCV 75.6* 74.8* 73.9*  PLT 120* 110* 96*   Cardiac Enzymes:  Recent Labs Lab 11/04/15 0500 11/04/15 1416 11/04/15 1900  TROPONINI 0.14* 0.12* 0.08*   BNP (last 3 results)  Recent Labs  11/03/15 1940  BNP 822.1*    ProBNP (last 3 results) No results for input(s): PROBNP in the last 8760 hours.  CBG:  Recent Labs Lab 11/04/15 1711 11/04/15 2135 11/05/15 0815 11/05/15 1605 11/05/15 2204  GLUCAP 211* 196* 180* 144* 122*    Recent Results (from the past 240 hour(s))  MRSA PCR Screening     Status: None   Collection Time: 11/04/15  3:11 AM  Result Value Ref Range Status   MRSA by PCR NEGATIVE NEGATIVE Final    Comment:        The GeneXpert MRSA Assay (FDA approved for NASAL specimens only), is one component of a comprehensive MRSA colonization surveillance program. It is not intended to diagnose MRSA infection nor to guide or monitor treatment for MRSA infections.   Culture, blood (routine x 2)     Status: None (Preliminary result)   Collection Time: 11/04/15  2:16 PM  Result Value Ref Range Status   Specimen Description BLOOD LEFT ARM  Final   Special Requests IN PEDIATRIC BOTTLE  0.5 CC  Final   Culture NO GROWTH 2 DAYS  Final   Report Status PENDING  Incomplete  Culture, blood (routine x 2)     Status: None (Preliminary result)   Collection Time: 11/04/15  2:25 PM  Result Value Ref Range Status   Specimen  Description BLOOD RIGHT HAND  Final   Special Requests IN PEDIATRIC BOTTLE 3 CC  Final   Culture NO GROWTH 2 DAYS  Final   Report Status PENDING  Incomplete     Studies: Dg Chest Port 1 View  11/04/2015  CLINICAL DATA:  Productive cough worsening since Monday. Shortness of breath EXAM: PORTABLE CHEST 1 VIEW COMPARISON:  11/03/2015 FINDINGS: Bilateral predominately perihilar airspace opacities are noted, right greater than left. There is mild cardiomegaly. Low lung volumes. No effusions  or acute bony abnormality. IMPRESSION: Bilateral perihilar airspace opacities, right greater than left. This could represent asymmetric edema or infection. Electronically Signed   By: Rolm Baptise M.D.   On: 11/04/2015 11:11   Dg Abd Portable 1v  11/04/2015  CLINICAL DATA:  Nausea EXAM: PORTABLE ABDOMEN - 1 VIEW COMPARISON:  None. FINDINGS: Scattered large and small bowel gas is noted. Fecal material is noted throughout the colon consistent with a mild degree of constipation. No abnormal mass is noted. The bony structures are within normal limits. IMPRESSION: Mild constipation. Electronically Signed   By: Inez Catalina M.D.   On: 11/04/2015 11:16    Scheduled Meds: . aspirin  81 mg Oral Daily  . bisacodyl  10 mg Rectal Daily  . cloNIDine  0.2 mg Oral TID  . docusate sodium  200 mg Oral BID  . enoxaparin (LOVENOX) injection  30 mg Subcutaneous Q24H  . heparin  4,000 Units Dialysis Once in dialysis  . insulin aspart  0-5 Units Subcutaneous QHS  . insulin aspart  0-9 Units Subcutaneous TID WC  . insulin glargine  10 Units Subcutaneous Daily  . levothyroxine  125 mcg Oral QAC breakfast  . metoprolol succinate  100 mg Oral Q supper  . NIFEdipine  60 mg Oral BID  . pantoprazole  80 mg Oral Q1200  . piperacillin-tazobactam (ZOSYN)  IV  2.25 g Intravenous 3 times per day  . pravastatin  40 mg Oral QHS  . pregabalin  75 mg Oral BID  . sevelamer carbonate  1,600 mg Oral TID WC  . vancomycin  1,000 mg Intravenous Q  T,Th,Sa-HD   Continuous Infusions: . nitroGLYCERIN Stopped (11/04/15 0720)    Active Problems:   Essential hypertension: Associated with renal disease   Acute respiratory failure with hypoxia (HCC)   End stage renal disease on dialysis due to type 2 diabetes mellitus ([place])   Diabetes mellitus, type II, insulin dependent ([place])   Hypothyroidism (acquired)   Diabetic peripheral neuropathy associated with type 2 diabetes mellitus ([place])   Acute hypoxemic respiratory failure ([place])   [facility], [facility] Pager [phone]. If 7PM-7AM, please contact night-coverage at [email or web address], password [facility] 11/06/2015, 10:43 AM  LOS: 3 days

## 2015-11-06 NOTE — Progress Notes (Signed)
Pt began to have an episode of coughing this am. During the coughing episode the patients heart rate increased to the 140's. Tesslon pearls were administered and calming techniques were used. The patients heart rate continued to elevated into the 150's. MD was notified and an EKG was taken showing SVT. Orders were placed for the patient to receive 5mg  of IV metoprolol and a 231ml bolus. IV team had been consulted earlier to start a new IV due to the current one being very difficult to flush, but they were unsuccessful. Amado Coe, RN attempted to start an IV on the patient but was also unsuccessful. Due to the current IV not appearing infiltrated and the patients heart rate continuing to increase, the IV metoprolol was administered, however, the IV was not able to tolerate the 237ml bolus at this time. IV team was contacted for a stat IV start. IV stick was successful. Heart rate continued to be elevated in the 150's after administration of the metoprolol. MD was notified and orders were placed for 5mg  IV labetalol q50min. One dose was administered with no decrease in pt's heart rate. MD notified and present at bedside and instructed this RN to  administered second dose. Upon checking pt's blood pressure before administering second dose, it was 76/42. MD instructed this RN to not administer the second dose of labetalol and that adenosine would be administered instead.  Patient resting comfortably after administration of adenosine.   Shelbie Hutching, RN, BSN

## 2015-11-06 NOTE — Care Management Important Message (Signed)
Important Message  Patient Details  Name: [name] MRN: [id number] Date of Birth: May 23, 1961   Medicare Important Message Given:  Yes    Saurav Crumble P [name] 11/06/2015, 12:09 PM

## 2015-11-06 NOTE — Progress Notes (Signed)
[facility] Progress Note   Background 55 y.o. female with a h/o DM, HTN and ESRD TTS regimen with home unit in [place], recently a transient at the [facility] unit with Dr. Marval Regal, last HD PTA on 11/01/2015 @ which time she left dialysis 0.6 kg over her EDW of 96.5kg. Admitted a month ago with PNA requiring intubation but she completed a full course of antibiotics and was back to her baseline state of health. Sunday she started to have a cough with productive sputum which worsened Monday with ensuing dyspnea as well. She was found to have findings c/w pulmonary edema on CXR in the ED with no obvious infiltrate, started on BiPAP + nitroglycerin gtt for hypertensive urgency. She required urgent dialysis AM 2/14 because of a low O2 sat despite supplemental O2   Assessment/Plan: 1. CHF - hx recent adm in [place] with PNA - EDW lowered this admission -had UF 3 L Tues and 3 L Wed to post wt  93.2; sats still in the low 90s; favor HD again today 3 hr to keep on schedule 2. PSVT - onset during coughing this adm; on MTP/s/p adenosine this am 3. ESRD - TTS - K 4.2 - resolved with HD 4. Anemia - Hgb 10.7 - stable off ESA for now - the last doseat her dialysis unit was 200 Mircera on 1/19 5. Secondary hyperparathyroidism - resume calcitriol 0.75 - last iPTH in 2/9 was 404 6. HTN/volume - BP improved with decrease in volume; + troponins trending down - may have had demand ischemia, this was before episode of SVT this am; had BP drop this am - may have been related to meds/event 7. Nutrition - alb 3- renal diet - add vitamins 8. Fever - tmax 99.5 - on empiric Vanc and Zosyn - WBC down to 5.9 9. DM- per primary 10. Thrombocytopenia - platelets trending down though were 95 2/9 as an outpt - usually 140 - 240 range 11. Hypothyroidism - last outpt TSH in Jan was 11 - recheck Myriam Jacobson, PA-C [place] [phone] 11/06/2015,9:06 AM  LOS: 3 days   I have seen and  examined this patient and agree with plan and assessment in the above note. For additional HD today to keep on schedule.Jamal Maes B,MD 11/06/2015 6:43 PM  Subjective:   Feels horrible after treatment for PSVT this am - has intermittently had this problem over the years. Still with cough  Objective Filed Vitals:   11/06/15 0459 11/06/15 0820 11/06/15 0854 11/06/15 0900  BP: 120/41 110/59 81/52 115/50  Pulse: 72 155 155 90  Temp: 98 F (36.7 C) 97.8 F (36.6 C)    TempSrc: Oral Oral    Resp: 16 22    Height:      Weight:      SpO2: 91% 92%     Physical Exam General: NAD Heart: RRR Lungs: no rales (but does have some dependent edema on left side which she was lying on) Abdomen: obese soft Extremities: no sig LE edema Dialysis Access: left upper AVGG + bruit  Dialysis Orders:  [facility] 4 hr EDW 96.5 2 K 2.5 Ca 450/700 left upper AVGG heparin 2500 calcitriol 0.75 last mircera 200 on 1/19 last outpt Hgb 11.2 2/9 no Fe  Additional Objective Labs: Basic Metabolic Panel:  Recent Labs Lab 11/04/15 0500 11/05/15 0300 11/06/15 0600  NA 139 136 137  K 5.2* 5.3* 4.2  CL 101 96* 96*  CO2 22 26 27  GLUCOSE 120* 174* 132*  BUN 62* 40* 23*  CREATININE 10.84* 8.85* 6.63*  CALCIUM 8.4* 8.1* 8.8*  PHOS 3.7  --  4.0   Liver Function Tests:  Recent Labs Lab 11/03/15 1940 11/04/15 0500 11/06/15 0600  AST 32  --   --   ALT 32  --   --   ALKPHOS 207*  --   --   BILITOT 0.6  --   --   PROT 8.3*  --   --   ALBUMIN 3.6 2.6* 3.0*   CBC:  Recent Labs Lab 11/03/15 1940 11/04/15 0500 11/06/15 0600  WBC 8.7 8.2 5.9  NEUTROABS 6.6  --   --   HGB 13.9 11.6* 10.7*  HCT 43.3 36.0 33.9*  MCV 75.6* 74.8* 73.9*  PLT 120* 110* 96*   Blood Culture    Component Value Date/Time   SDES BLOOD RIGHT HAND 11/04/2015 1425   SPECREQUEST IN PEDIATRIC BOTTLE 3 CC 11/04/2015 1425   CULT NO GROWTH 2 DAYS 11/04/2015 1425   REPTSTATUS PENDING 11/04/2015 1425    Cardiac  Enzymes:  Recent Labs Lab 11/04/15 0500 11/04/15 1416 11/04/15 1900  TROPONINI 0.14* 0.12* 0.08*   CBG:  Recent Labs Lab 11/04/15 1711 11/04/15 2135 11/05/15 0815 11/05/15 1605 11/05/15 2204  GLUCAP 211* 196* 180* 144* 122*    Studies/Results: Dg Chest Port 1 View  11/04/2015  CLINICAL DATA:  Productive cough worsening since Monday. Shortness of breath EXAM: PORTABLE CHEST 1 VIEW COMPARISON:  11/03/2015 FINDINGS: Bilateral predominately perihilar airspace opacities are noted, right greater than left. There is mild cardiomegaly. Low lung volumes. No effusions or acute bony abnormality. IMPRESSION: Bilateral perihilar airspace opacities, right greater than left. This could represent asymmetric edema or infection. Electronically Signed   By: Rolm Baptise M.D.   On: 11/04/2015 11:11   Dg Abd Portable 1v  11/04/2015  CLINICAL DATA:  Nausea EXAM: PORTABLE ABDOMEN - 1 VIEW COMPARISON:  None. FINDINGS: Scattered large and small bowel gas is noted. Fecal material is noted throughout the colon consistent with a mild degree of constipation. No abnormal mass is noted. The bony structures are within normal limits. IMPRESSION: Mild constipation. Electronically Signed   By: Inez Catalina M.D.   On: 11/04/2015 11:16   Medications: . nitroGLYCERIN Stopped (11/04/15 0720)   . adenosine (ADENOCARD) IV  6 mg Intravenous Once  . aspirin  81 mg Oral Daily  . bisacodyl  10 mg Rectal Daily  . cloNIDine  0.2 mg Oral TID  . docusate sodium  200 mg Oral BID  . enoxaparin (LOVENOX) injection  30 mg Subcutaneous Q24H  . heparin  4,000 Units Dialysis Once in dialysis  . insulin aspart  0-5 Units Subcutaneous QHS  . insulin aspart  0-9 Units Subcutaneous TID WC  . insulin glargine  10 Units Subcutaneous Daily  . levothyroxine  125 mcg Oral QAC breakfast  . metoprolol succinate  100 mg Oral Q supper  . NIFEdipine  60 mg Oral BID  . pantoprazole  80 mg Oral Q1200  . piperacillin-tazobactam (ZOSYN)  IV   2.25 g Intravenous 3 times per day  . pravastatin  40 mg Oral QHS  . pregabalin  75 mg Oral BID  . sevelamer carbonate  1,600 mg Oral TID WC  . vancomycin  1,000 mg Intravenous Q T,Th,Sa-HD

## 2015-11-06 NOTE — Progress Notes (Signed)
Echocardiogram 2D Echocardiogram has been performed.  [name] 11/06/2015, 11:41 AM

## 2015-11-07 DIAGNOSIS — E1142 Type 2 diabetes mellitus with diabetic polyneuropathy: Secondary | ICD-10-CM

## 2015-11-07 LAB — BASIC METABOLIC PANEL
ANION GAP: 15 (ref 5–15)
BUN: 24 mg/dL — ABNORMAL HIGH (ref 6–20)
CALCIUM: 8.5 mg/dL — AB (ref 8.9–10.3)
CO2: 25 mmol/L (ref 22–32)
CREATININE: 6.17 mg/dL — AB (ref 0.44–1.00)
Chloride: 95 mmol/L — ABNORMAL LOW (ref 101–111)
GFR, EST AFRICAN AMERICAN: 8 mL/min — AB (ref >60)
GFR, EST NON AFRICAN AMERICAN: 7 mL/min — AB (ref >60)
GLUCOSE: 160 mg/dL — AB (ref 65–99)
Potassium: 4.1 mmol/L (ref 3.5–5.1)
SODIUM: 135 mmol/L (ref 135–145)

## 2015-11-07 LAB — GLUCOSE, CAPILLARY
GLUCOSE-CAPILLARY: 129 mg/dL — AB (ref 65–99)
GLUCOSE-CAPILLARY: 146 mg/dL — AB (ref 65–99)
Glucose-Capillary: 124 mg/dL — ABNORMAL HIGH (ref 65–99)
Glucose-Capillary: 153 mg/dL — ABNORMAL HIGH (ref 65–99)
Glucose-Capillary: 151 mg/dL — ABNORMAL HIGH (ref 65–99)

## 2015-11-07 LAB — MAGNESIUM: Magnesium: 1.9 mg/dL (ref 1.7–2.4)

## 2015-11-07 MED ORDER — ZOLPIDEM TARTRATE 5 MG PO TABS
5.0000 mg | ORAL_TABLET | Freq: Once | ORAL | Status: AC
  Administered 2015-11-07: 5 mg via ORAL
  Filled 2015-11-07: qty 1

## 2015-11-07 NOTE — Consult Note (Signed)
Pharmacy Antibiotic Note  [name] is a 55 y.o. female admitted on 11/03/2015 with pneumonia.  Pharmacy has been consulted for vancomycin and zosyn dosing. She is ESRD on HD TTS, last HD yesterday for 3 hr @ BFR 400. All cultures ngtd.  Plan: 1) Vancomycin 1 g IV qHD - TuThSat -- consider stopping vancomycin  2) Zosyn 2.25 g IV q8h   Height: 5\' 2"  (157.5 cm) Weight: 204 lb 12.9 oz (92.9 kg) IBW/kg (Calculated) : 50.1  Temp (24hrs), Avg:98.1 F (36.7 C), Min:97.8 F (36.6 C), Max:98.4 F (36.9 C)   Recent Labs Lab 11/03/15 1940 11/04/15 0500 11/05/15 0300 11/06/15 0600 11/06/15 1500 11/07/15 1058  WBC 8.7 8.2  --  5.9  --   --   CREATININE 10.44* 10.84* 8.85* 6.63* 7.17* 6.17*  LATICACIDVEN  --  1.6  --   --   --   --     Estimated Creatinine Clearance: 11.1 mL/min (by C-G formula based on Cr of 6.17).    No Known Allergies  Antimicrobials this admission: 2/14 Vancomycin >> 2/14 Zosyn >>  Dose adjustments this admission: n/a  Microbiology results: None yet  Thank you for allowing pharmacy to be a part of this patient's care.  Harvel Quale 11/07/2015 3:32 PM

## 2015-11-07 NOTE — Progress Notes (Signed)
[facility] PROGRESS NOTE  [name] [id number] DOB: 29-Jun-1961 DOA: 11/03/2015 PCP: Ricke Hey, MD  HPI/Brief narrative Please see admit h and p from 2/13 for details. Briefly, 55 y.o. female with a past medical history significant for ESRD on HD TThS, IDDM, hypothyroidism, and HTN who presents with dyspnea, patient admitted with concerns for acute volume overload  Assessment/Plan: 1. Acute hypoxic respiratory failure requiring BiPAP/nonrebreather mask secondary to acutely decompensated grade 2 diastolic heart failure. Due to fluid overload in a ESRD patient. EF of 60% on echogram in January 2016 without any diastolic dysfunction. 2D echo this admission with EF 60-65% with grade 2 diastolic dysfunction. Symptoms improved after dialysis. Patient recently febrile and was started on empiric vanc and zosyn. CXR with questionable findings of edema vs infiltrate. Currently afebrile. Anticipate de-escalating abx soon. Will obtain repeat CXR in AM  2. ESRD. Renal on board, being dialyzed, on Tuesday, Thursday and Saturday schedule. Continue dialysis per Nephrology  3. Hypertensive urgency. Home medications adjusted, have increased Catapres dose, continue beta blocker & Nifedipine, Off NTG drip, as needed IV hydralazine. Blood pressures improved. Likely due to #1 above.  4. Hyperkalemia. Treated in the ER and now being dialyzed.  5. Dyslipidemia. Continue home dose statin.  6. GERD. On PPI continue.  7. Diabetic neuropathy. On Pregablin continue.  8. Mild nausea. Much improved. Pt constipated. Pt with BM overnight.   9.DM type II on Lantus and sliding scale continue to monitor.  10. Sinus tachycardia. HR to the 150's in the AM of 2/16 after coughing spell. See nursing and RRT notation. Patient failed to improve with IV beta blocker x 2. As patient was symptomatic, Adenosine 6mg  was given with rapid return to NSR.  Code Status: full Family Communication: Pt in  room Disposition Plan: Possible d/c home in 24-48hrs   Consultants:  Nephrology  Procedures:    Antibiotics: Anti-infectives    Start     Dose/Rate Route Frequency Ordered Stop   11/06/15 1200  vancomycin (VANCOCIN) IVPB 1000 mg/200 mL premix     1,000 mg 200 mL/hr over 60 Minutes Intravenous Every T-Th-Sa (Hemodialysis) 11/05/15 1031     11/05/15 1600  vancomycin (VANCOCIN) IVPB 1000 mg/200 mL premix     1,000 mg 200 mL/hr over 60 Minutes Intravenous  Once 11/05/15 1031 11/05/15 1427   11/04/15 1400  piperacillin-tazobactam (ZOSYN) IVPB 2.25 g     2.25 g 100 mL/hr over 30 Minutes Intravenous 3 times per day 11/04/15 1313     11/04/15 1400  vancomycin (VANCOCIN) 1,500 mg in sodium chloride 0.9 % 250 mL IVPB     1,500 mg 250 mL/hr over 60 Minutes Intravenous  Once 11/04/15 1314 11/04/15 1518      HPI/Subjective: Feels better this AM. Still reports mild sob that is worse with mild exertion  Objective: Filed Vitals:   11/07/15 0559 11/07/15 0600 11/07/15 0921 11/07/15 1655  BP: 134/49  143/60 141/70  Pulse: 70  69 72  Temp: 98 F (36.7 C)  97.8 F (36.6 C) 98.8 F (37.1 C)  TempSrc: Oral  Oral Oral  Resp: 20  16 16   Height:      Weight:      SpO2: 87% 100% 95% 99%    Intake/Output Summary (Last 24 hours) at 11/07/15 1834 Last data filed at 11/07/15 1821  Gross per 24 hour  Intake   1180 ml  Output      0 ml  Net   1180 ml  Filed Weights   11/05/15 2205 11/06/15 1400 11/06/15 1715  Weight: 94.2 kg (207 lb 10.8 oz) 94.4 kg (208 lb 1.8 oz) 92.9 kg (204 lb 12.9 oz)    Exam:   General:  Awake, laying in bed, in nad  Cardiovascular: tachycardic, s1, s2  Respiratory: normal resp effort, no wheezing  Abdomen: soft,nondistended  Musculoskeletal: perfused, no clubbing, no cyanosis  Data Reviewed: Basic Metabolic Panel:  Recent Labs Lab 11/04/15 0500 11/05/15 0300 11/06/15 0600 11/06/15 1500 11/07/15 1058  NA 139 136 137 135 135  K 5.2* 5.3* 4.2  4.4 4.1  CL 101 96* 96* 96* 95*  CO2 22 26 27 26 25   GLUCOSE 120* 174* 132* 106* 160*  BUN 62* 40* 23* 28* 24*  CREATININE 10.84* 8.85* 6.63* 7.17* 6.17*  CALCIUM 8.4* 8.1* 8.8* 8.5* 8.5*  MG  --   --   --   --  1.9  PHOS 3.7  --  4.0  --   --    Liver Function Tests:  Recent Labs Lab 11/03/15 1940 11/04/15 0500 11/06/15 0600  AST 32  --   --   ALT 32  --   --   ALKPHOS 207*  --   --   BILITOT 0.6  --   --   PROT 8.3*  --   --   ALBUMIN 3.6 2.6* 3.0*   No results for input(s): LIPASE, AMYLASE in the last 168 hours. No results for input(s): AMMONIA in the last 168 hours. CBC:  Recent Labs Lab 11/03/15 1940 11/04/15 0500 11/06/15 0600  WBC 8.7 8.2 5.9  NEUTROABS 6.6  --   --   HGB 13.9 11.6* 10.7*  HCT 43.3 36.0 33.9*  MCV 75.6* 74.8* 73.9*  PLT 120* 110* 96*   Cardiac Enzymes:  Recent Labs Lab 11/04/15 0500 11/04/15 1416 11/04/15 1900  TROPONINI 0.14* 0.12* 0.08*   BNP (last 3 results)  Recent Labs  11/03/15 1940  BNP 822.1*    ProBNP (last 3 results) No results for input(s): PROBNP in the last 8760 hours.  CBG:  Recent Labs Lab 11/06/15 1801 11/06/15 2201 11/07/15 0900 11/07/15 1115 11/07/15 1615  GLUCAP 98 146* 124* 153* 129*    Recent Results (from the past 240 hour(s))  MRSA PCR Screening     Status: None   Collection Time: 11/04/15  3:11 AM  Result Value Ref Range Status   MRSA by PCR NEGATIVE NEGATIVE Final    Comment:        The GeneXpert MRSA Assay (FDA approved for NASAL specimens only), is one component of a comprehensive MRSA colonization surveillance program. It is not intended to diagnose MRSA infection nor to guide or monitor treatment for MRSA infections.   Culture, blood (routine x 2)     Status: None (Preliminary result)   Collection Time: 11/04/15  2:16 PM  Result Value Ref Range Status   Specimen Description BLOOD LEFT ARM  Final   Special Requests IN PEDIATRIC BOTTLE  0.5 CC  Final   Culture NO GROWTH 3  DAYS  Final   Report Status PENDING  Incomplete  Culture, blood (routine x 2)     Status: None (Preliminary result)   Collection Time: 11/04/15  2:25 PM  Result Value Ref Range Status   Specimen Description BLOOD RIGHT HAND  Final   Special Requests IN PEDIATRIC BOTTLE 3 CC  Final   Culture NO GROWTH 3 DAYS  Final   Report Status PENDING  Incomplete  Studies: No results found.  Scheduled Meds: . aspirin  81 mg Oral Daily  . bisacodyl  10 mg Rectal Daily  . cloNIDine  0.2 mg Oral TID  . docusate sodium  200 mg Oral BID  . enoxaparin (LOVENOX) injection  30 mg Subcutaneous Q24H  . heparin  4,000 Units Dialysis Once in dialysis  . insulin aspart  0-5 Units Subcutaneous QHS  . insulin aspart  0-9 Units Subcutaneous TID WC  . insulin glargine  10 Units Subcutaneous Daily  . levothyroxine  125 mcg Oral QAC breakfast  . metoprolol succinate  100 mg Oral Q supper  . multivitamin  1 tablet Oral QHS  . NIFEdipine  60 mg Oral BID  . pantoprazole  80 mg Oral Q1200  . piperacillin-tazobactam (ZOSYN)  IV  2.25 g Intravenous 3 times per day  . pravastatin  40 mg Oral QHS  . pregabalin  75 mg Oral BID  . sevelamer carbonate  1,600 mg Oral TID WC  . vancomycin  1,000 mg Intravenous Q T,Th,Sa-HD   Continuous Infusions: . nitroGLYCERIN Stopped (11/04/15 0720)    Active Problems:   Essential hypertension: Associated with renal disease   Acute respiratory failure with hypoxia (HCC)   End stage renal disease on dialysis due to type 2 diabetes mellitus (HCC)   Diabetes mellitus, type II, insulin dependent ([place])   Hypothyroidism (acquired)   Diabetic peripheral neuropathy associated with type 2 diabetes mellitus ([place])   Acute hypoxemic respiratory failure ([place])   Sinus tachycardia ([place])   [name], [facility] Pager [phone]. If 7PM-7AM, please contact night-coverage at [email or web address], password [facility] 11/07/2015, 6:34 PM  LOS: 4 days

## 2015-11-07 NOTE — Progress Notes (Signed)
[facility] Rounding Note  Subjective:  Feels better /tol hd yest EDW has been lowered substantially - close to 6 kg Breathing and cough are much better  Objective Vital signs in last 24 hours: Filed Vitals:   11/06/15 2203 11/07/15 0559 11/07/15 0600 11/07/15 0921  BP: 150/56 134/49  143/60  Pulse: 72 70  69  Temp: 98.4 F (36.9 C) 98 F (36.7 C)  97.8 F (36.6 C)  TempSrc: Oral Oral  Oral  Resp: 17 20  16   Height:      Weight:      SpO2: 95% 87% 100% 95%   Weight change: -1.9 kg (-4 lb 3 oz)  Physical Exam: General: alert/ NAD/ appropriate Heart: RRR/ no m,r,g  Lungs: Rare R lower rhonchi/other cta Abdomen: BS + , obese , soft, NT, ND Extremities: no pedal edema Dialysis Access: pos bruit LUA AVF    OP Dialysis Orders: [facility] 4 hr  EDW 96.5 will be much lower at discharge 2 K 2.5 Ca 450/700 left upper AVGG heparin 2500 calcitriol 0.75 last mircera 200 on 1/19 last outpt Hgb 11.2 2/9 no Fe  Problem/Plan: 1. CHF -  Decompensated  With ? Infiltrate on CXR/hx recent adm in [place] with PNA - EDW lowered this admission -had UF 1.5L yest wt to 92.9 kg (OLD EDW 96.5 ) sats 95%this am 2l / currently O2 off and no sob/ O2 sat pend rm air; favor/ "walking in room no sob" Goal weight 91 kg with HD tomorrow. ECHO LVEF normal (2/16) 2. PSVT - onset during coughing this adm; on MTP/s/p adenosine yest>to NSR 3. ESRD - TTS - K 4.1 - Hyperkalemia on admit sec missed HD 4. Anemia - Hgb 10.7 - stable off ESA for now - the last dose at her dialysis unit was 200 Mircera on 1/19 5. Secondary hyperparathyroidism - resumed calcitriol 0.75 - last iPTH in 2/9 was 404/ phos 4.0  On Renvela  6. HTN/volume - BP 134/49  improved with decreased with  Volume uf/ + troponins trending down -probable  demand ischemia, / clonidine 0.2 tid  7. Fever - tmax 99.5 - on empiric Vanc and Zosyn - WBC down to 5.9 ?when to stop ATB's.  8 DM- per primary  Ernest Haber, PA-C [place] [phone] 11/07/2015,12:08 PM  LOS: 4 days    I have seen and examined this patient and agree with plan and assessment in the above note with highlighted additions. For HD again tomorrow. Still on ATBS for presumed PNA. Clinically much better. Sats OK at rest oon RA. Says still gets dyspneic with exertion but not nearly as bad.   Zaedyn Covin B,MD 11/07/2015 1:41 PM  Labs: Basic Metabolic Panel:  Recent Labs Lab 11/04/15 0500  11/06/15 0600 11/06/15 1500 11/07/15 1058  NA 139  < > 137 135 135  K 5.2*  < > 4.2 4.4 4.1  CL 101  < > 96* 96* 95*  CO2 22  < > 27 26 25   GLUCOSE 120*  < > 132* 106* 160*  BUN 62*  < > 23* 28* 24*  CREATININE 10.84*  < > 6.63* 7.17* 6.17*  CALCIUM 8.4*  < > 8.8* 8.5* 8.5*  PHOS 3.7  --  4.0  --   --   < > = values in this interval not displayed. Liver Function Tests:  Recent Labs Lab 11/03/15 1940 11/04/15 0500 11/06/15 0600  AST 32  --   --   ALT 32  --   --  ALKPHOS 207*  --   --   BILITOT 0.6  --   --   PROT 8.3*  --   --   ALBUMIN 3.6 2.6* 3.0*   CBC:  Recent Labs Lab 11/03/15 1940 11/04/15 0500 11/06/15 0600  WBC 8.7 8.2 5.9  NEUTROABS 6.6  --   --   HGB 13.9 11.6* 10.7*  HCT 43.3 36.0 33.9*  MCV 75.6* 74.8* 73.9*  PLT 120* 110* 96*   Cardiac Enzymes:  Recent Labs Lab 11/04/15 0500 11/04/15 1416 11/04/15 1900  TROPONINI 0.14* 0.12* 0.08*   CBG:  Recent Labs Lab 11/06/15 1155 11/06/15 1801 11/06/15 2201 11/07/15 0900 11/07/15 1115  GLUCAP 109* 98 146* 124* 153*    Studies/Results: No results found. Medications: . nitroGLYCERIN Stopped (11/04/15 0720)   . aspirin  81 mg Oral Daily  . bisacodyl  10 mg Rectal Daily  . cloNIDine  0.2 mg Oral TID  . docusate sodium  200 mg Oral BID  . enoxaparin (LOVENOX) injection  30 mg Subcutaneous Q24H  . heparin  4,000 Units Dialysis Once in dialysis  . insulin aspart  0-5 Units Subcutaneous QHS  . insulin aspart  0-9 Units Subcutaneous TID WC  .  insulin glargine  10 Units Subcutaneous Daily  . levothyroxine  125 mcg Oral QAC breakfast  . metoprolol succinate  100 mg Oral Q supper  . multivitamin  1 tablet Oral QHS  . NIFEdipine  60 mg Oral BID  . pantoprazole  80 mg Oral Q1200  . piperacillin-tazobactam (ZOSYN)  IV  2.25 g Intravenous 3 times per day  . pravastatin  40 mg Oral QHS  . pregabalin  75 mg Oral BID  . sevelamer carbonate  1,600 mg Oral TID WC  . vancomycin  1,000 mg Intravenous Q T,Th,Sa-HD

## 2015-11-08 ENCOUNTER — Inpatient Hospital Stay (HOSPITAL_COMMUNITY): Payer: Medicare Other

## 2015-11-08 LAB — BASIC METABOLIC PANEL
Anion gap: 16 — ABNORMAL HIGH (ref 5–15)
BUN: 36 mg/dL — ABNORMAL HIGH (ref 6–20)
CALCIUM: 8.4 mg/dL — AB (ref 8.9–10.3)
CHLORIDE: 92 mmol/L — AB (ref 101–111)
CO2: 24 mmol/L (ref 22–32)
CREATININE: 7.97 mg/dL — AB (ref 0.44–1.00)
GFR, EST AFRICAN AMERICAN: 6 mL/min — AB (ref >60)
GFR, EST NON AFRICAN AMERICAN: 5 mL/min — AB (ref >60)
Glucose, Bld: 173 mg/dL — ABNORMAL HIGH (ref 65–99)
Potassium: 4.6 mmol/L (ref 3.5–5.1)
SODIUM: 132 mmol/L — AB (ref 135–145)

## 2015-11-08 LAB — GLUCOSE, CAPILLARY
GLUCOSE-CAPILLARY: 158 mg/dL — AB (ref 65–99)
GLUCOSE-CAPILLARY: 108 mg/dL — AB (ref 65–99)
Glucose-Capillary: 136 mg/dL — ABNORMAL HIGH (ref 65–99)

## 2015-11-08 MED ORDER — LEVOFLOXACIN 750 MG PO TABS
750.0000 mg | ORAL_TABLET | Freq: Once | ORAL | Status: AC
  Administered 2015-11-08: 750 mg via ORAL
  Filled 2015-11-08: qty 1

## 2015-11-08 MED ORDER — ZOLPIDEM TARTRATE 5 MG PO TABS
5.0000 mg | ORAL_TABLET | Freq: Once | ORAL | Status: AC
  Administered 2015-11-08: 5 mg via ORAL

## 2015-11-08 NOTE — Progress Notes (Signed)
[facility] PROGRESS NOTE  [name] [id number] DOB: Jul 16, 1961 DOA: 11/03/2015 PCP: Ricke Hey, MD  HPI/Brief narrative Please see admit h and p from 2/13 for details. Briefly, 55 y.o. female with a past medical history significant for ESRD on HD TThS, IDDM, hypothyroidism, and HTN who presents with dyspnea, patient admitted with concerns for acute volume overload  Assessment/Plan: 1. Acute hypoxic respiratory failure requiring BiPAP/nonrebreather mask secondary to acutely decompensated grade 2 diastolic heart failure. Due to fluid overload in a ESRD patient. EF of 60% on echogram in January 2016 without any diastolic dysfunction. 2D echo this admission with EF 60-65% with grade 2 diastolic dysfunction. Symptoms improved after dialysis. Patient recently febrile and was started on empiric vanc and zosyn. CXR with questionable findings of edema vs infiltrate. Currently afebrile. Patient has completed 4 days of IV abx. Will give one dose of levaquin to complete course. Follow up CXR pending. Patient reports breathing better today  2. ESRD. Renal on board, being dialyzed, on Tuesday, Thursday and Saturday schedule. Continue dialysis per Nephrology. Fluctuations in weights noted. Patient to follow up at [facility] dialysis center on discharge  3. Hypertensive urgency. Home medications adjusted, have increased Catapres dose, continue beta blocker & Nifedipine, Off NTG drip, as needed IV hydralazine. Blood pressures have improved. Likely due to #1 above.  4. Hyperkalemia. Treated in the ER and now being dialyzed.  5. Dyslipidemia. Continue home dose statin.  6. GERD. On PPI continue.  7. Diabetic neuropathy. On Pregablin continue.  8. Mild nausea. Much improved. Pt constipated. Pt with BM overnight.   9.DM type II on Lantus and sliding scale continue to monitor.  10. Sinus tachycardia. HR to the 150's in the AM of 2/16 after coughing spell. See nursing and RRT notation.  Patient failed to improve with IV beta blocker x 2. As patient was symptomatic, Adenosine 6mg  was given with rapid return to NSR.  Code Status: full Family Communication: Pt in room Disposition Plan: Possible d/c home in 24-48hrs   Consultants:  Nephrology  Procedures:    Antibiotics: Anti-infectives    Start     Dose/Rate Route Frequency Ordered Stop   11/08/15 1200  levofloxacin (LEVAQUIN) tablet 750 mg     750 mg Oral  Once 11/08/15 0935     11/06/15 1200  vancomycin (VANCOCIN) IVPB 1000 mg/200 mL premix  Status:  Discontinued     1,000 mg 200 mL/hr over 60 Minutes Intravenous Every T-Th-Sa (Hemodialysis) 11/05/15 1031 11/08/15 0916   11/05/15 1600  vancomycin (VANCOCIN) IVPB 1000 mg/200 mL premix     1,000 mg 200 mL/hr over 60 Minutes Intravenous  Once 11/05/15 1031 11/05/15 1427   11/04/15 1400  piperacillin-tazobactam (ZOSYN) IVPB 2.25 g  Status:  Discontinued     2.25 g 100 mL/hr over 30 Minutes Intravenous 3 times per day 11/04/15 1313 11/08/15 0916   11/04/15 1400  vancomycin (VANCOCIN) 1,500 mg in sodium chloride 0.9 % 250 mL IVPB     1,500 mg 250 mL/hr over 60 Minutes Intravenous  Once 11/04/15 1314 11/04/15 1518      HPI/Subjective: Reports breathing better overall but concerned bout hypoxia on exertion  Objective: Filed Vitals:   11/08/15 0859 11/08/15 1436 11/08/15 1447 11/08/15 1500  BP: 143/64 135/82 179/64 161/79  Pulse: 64 77 66 67  Temp: 97.7 F (36.5 C) 97.7 F (36.5 C)    TempSrc: Oral Oral    Resp: 18     Height:      Weight:  95.7 kg (210 lb 15.7 oz)    SpO2: 94% 94%      Intake/Output Summary (Last 24 hours) at 11/08/15 1544 Last data filed at 11/08/15 1500  Gross per 24 hour  Intake    940 ml  Output      0 ml  Net    940 ml   Filed Weights   11/06/15 1715 11/07/15 1951 11/08/15 1436  Weight: 92.9 kg (204 lb 12.9 oz) 98.3 kg (216 lb 11.4 oz) 95.7 kg (210 lb 15.7 oz)    Exam:   General:  Awake, laying in bed, in  nad  Cardiovascular: tachycardic, s1, s2  Respiratory: normal resp effort, no wheezing  Abdomen: soft,nondistended, pos BS  Musculoskeletal: perfused, no cyanosis  Data Reviewed: Basic Metabolic Panel:  Recent Labs Lab 11/04/15 0500 11/05/15 0300 11/06/15 0600 11/06/15 1500 11/07/15 1058 11/08/15 0653  NA 139 136 137 135 135 132*  K 5.2* 5.3* 4.2 4.4 4.1 4.6  CL 101 96* 96* 96* 95* 92*  CO2 22 26 27 26 25 24   GLUCOSE 120* 174* 132* 106* 160* 173*  BUN 62* 40* 23* 28* 24* 36*  CREATININE 10.84* 8.85* 6.63* 7.17* 6.17* 7.97*  CALCIUM 8.4* 8.1* 8.8* 8.5* 8.5* 8.4*  MG  --   --   --   --  1.9  --   PHOS 3.7  --  4.0  --   --   --    Liver Function Tests:  Recent Labs Lab 11/03/15 1940 11/04/15 0500 11/06/15 0600  AST 32  --   --   ALT 32  --   --   ALKPHOS 207*  --   --   BILITOT 0.6  --   --   PROT 8.3*  --   --   ALBUMIN 3.6 2.6* 3.0*   No results for input(s): LIPASE, AMYLASE in the last 168 hours. No results for input(s): AMMONIA in the last 168 hours. CBC:  Recent Labs Lab 11/03/15 1940 11/04/15 0500 11/06/15 0600  WBC 8.7 8.2 5.9  NEUTROABS 6.6  --   --   HGB 13.9 11.6* 10.7*  HCT 43.3 36.0 33.9*  MCV 75.6* 74.8* 73.9*  PLT 120* 110* 96*   Cardiac Enzymes:  Recent Labs Lab 11/04/15 0500 11/04/15 1416 11/04/15 1900  TROPONINI 0.14* 0.12* 0.08*   BNP (last 3 results)  Recent Labs  11/03/15 1940  BNP 822.1*    ProBNP (last 3 results) No results for input(s): PROBNP in the last 8760 hours.  CBG:  Recent Labs Lab 11/07/15 1615 11/07/15 1949 11/07/15 2119 11/08/15 0811 11/08/15 1139  GLUCAP 129* 151* 146* 158* 136*    Recent Results (from the past 240 hour(s))  MRSA PCR Screening     Status: None   Collection Time: 11/04/15  3:11 AM  Result Value Ref Range Status   MRSA by PCR NEGATIVE NEGATIVE Final    Comment:        The GeneXpert MRSA Assay (FDA approved for NASAL specimens only), is one component of a comprehensive  MRSA colonization surveillance program. It is not intended to diagnose MRSA infection nor to guide or monitor treatment for MRSA infections.   Culture, blood (routine x 2)     Status: None (Preliminary result)   Collection Time: 11/04/15  2:16 PM  Result Value Ref Range Status   Specimen Description BLOOD LEFT ARM  Final   Special Requests IN PEDIATRIC BOTTLE  0.5 CC  Final   Culture NO  GROWTH 4 DAYS  Final   Report Status PENDING  Incomplete  Culture, blood (routine x 2)     Status: None (Preliminary result)   Collection Time: 11/04/15  2:25 PM  Result Value Ref Range Status   Specimen Description BLOOD RIGHT HAND  Final   Special Requests IN PEDIATRIC BOTTLE 3 CC  Final   Culture NO GROWTH 4 DAYS  Final   Report Status PENDING  Incomplete     Studies: No results found.  Scheduled Meds: . aspirin  81 mg Oral Daily  . bisacodyl  10 mg Rectal Daily  . cloNIDine  0.2 mg Oral TID  . docusate sodium  200 mg Oral BID  . enoxaparin (LOVENOX) injection  30 mg Subcutaneous Q24H  . heparin  4,000 Units Dialysis Once in dialysis  . insulin aspart  0-5 Units Subcutaneous QHS  . insulin aspart  0-9 Units Subcutaneous TID WC  . insulin glargine  10 Units Subcutaneous Daily  . levofloxacin  750 mg Oral Once  . levothyroxine  125 mcg Oral QAC breakfast  . metoprolol succinate  100 mg Oral Q supper  . multivitamin  1 tablet Oral QHS  . NIFEdipine  60 mg Oral BID  . pantoprazole  80 mg Oral Q1200  . pravastatin  40 mg Oral QHS  . pregabalin  75 mg Oral BID  . sevelamer carbonate  1,600 mg Oral TID WC   Continuous Infusions: . nitroGLYCERIN Stopped (11/04/15 0720)    Active Problems:   Essential hypertension: Associated with renal disease   Acute respiratory failure with hypoxia (HCC)   End stage renal disease on dialysis due to type 2 diabetes mellitus (HCC)   Diabetes mellitus, type II, insulin dependent ([place])   Hypothyroidism (acquired)   Diabetic peripheral neuropathy  associated with type 2 diabetes mellitus ([place])   Acute hypoxemic respiratory failure ([place])   Sinus tachycardia ([place])   Antwann Preziosi, [facility] Pager [phone]. If 7PM-7AM, please contact night-coverage at [email or web address], password [facility] 11/08/2015, 3:44 PM  LOS: 5 days

## 2015-11-08 NOTE — Progress Notes (Addendum)
ANTIBIOTIC CONSULT NOTE - INITIAL  Pharmacy Consult for Levofloxacin Indication: pneumonia and HCAP  No Known Allergies  Patient Measurements: Height: 5\' 2"  (157.5 cm) Weight: 216 lb 11.4 oz (98.3 kg) IBW/kg (Calculated) : 50.1   Vital Signs: Temp: 97.7 F (36.5 C) (02/18 0859) Temp Source: Oral (02/18 0859) BP: 143/64 mmHg (02/18 0859) Pulse Rate: 64 (02/18 0859) Intake/Output from previous day: 02/17 0701 - 02/18 0700 In: 1060 [P.O.:960; IV Piggyback:100] Out: -  Intake/Output from this shift:    Labs:  Recent Labs  11/06/15 0600 11/06/15 1500 11/07/15 1058 11/08/15 0653  WBC 5.9  --   --   --   HGB 10.7*  --   --   --   PLT 96*  --   --   --   CREATININE 6.63* 7.17* 6.17* 7.97*   Estimated Creatinine Clearance: 8.8 mL/min (by C-G formula based on Cr of 7.97). No results for input(s): VANCOTROUGH, VANCOPEAK, VANCORANDOM, GENTTROUGH, GENTPEAK, GENTRANDOM, TOBRATROUGH, TOBRAPEAK, TOBRARND, AMIKACINPEAK, AMIKACINTROU, AMIKACIN in the last 72 hours.   Microbiology: Recent Results (from the past 720 hour(s))  MRSA PCR Screening     Status: None   Collection Time: 11/04/15  3:11 AM  Result Value Ref Range Status   MRSA by PCR NEGATIVE NEGATIVE Final    Comment:        The GeneXpert MRSA Assay (FDA approved for NASAL specimens only), is one component of a comprehensive MRSA colonization surveillance program. It is not intended to diagnose MRSA infection nor to guide or monitor treatment for MRSA infections.   Culture, blood (routine x 2)     Status: None (Preliminary result)   Collection Time: 11/04/15  2:16 PM  Result Value Ref Range Status   Specimen Description BLOOD LEFT ARM  Final   Special Requests IN PEDIATRIC BOTTLE  0.5 CC  Final   Culture NO GROWTH 3 DAYS  Final   Report Status PENDING  Incomplete  Culture, blood (routine x 2)     Status: None (Preliminary result)   Collection Time: 11/04/15  2:25 PM  Result Value Ref Range Status   Specimen  Description BLOOD RIGHT HAND  Final   Special Requests IN PEDIATRIC BOTTLE 3 CC  Final   Culture NO GROWTH 3 DAYS  Final   Report Status PENDING  Incomplete    Medical History: Past Medical History  Diagnosis Date  . Diabetes mellitus, type II, insulin dependent ([place])   . Paroxysmal supraventricular tachycardia ([place])   . End stage renal disease on dialysis ([place])     Hemodialysis T, TH, Sat  . Hypothyroidism (acquired)   . Essential hypertension     Difficult control. On multiple medications.  . Diabetic peripheral neuropathy associated with type 2 diabetes mellitus ([place])   . Peripheral arterial disease [facility] December 2014    @ [place]: a) RSFA PTA (01/2013); b) L SFA [place] Atherectomy/PTA with 3V runoff; LEA Dopplers January 2016 [facility] post L SFA PTA): Mild, insignificant disease in the left CFA, profunda, SFA, popliteal artery and tibioperoneal trunk.  . 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 Hemodialysis patient ([place])   . Hyperlipidemia   . Sickle cell trait ([place])   . Gastroparesis due to DM ([place])   . GERD (gastroesophageal reflux disease)   . Heart murmur   . Arthritis     Medications:  Scheduled:  . aspirin  81 mg Oral Daily  . bisacodyl  10 mg Rectal Daily  .  cloNIDine  0.2 mg Oral TID  . docusate sodium  200 mg Oral BID  . enoxaparin (LOVENOX) injection  30 mg Subcutaneous Q24H  . heparin  4,000 Units Dialysis Once in dialysis  . insulin aspart  0-5 Units Subcutaneous QHS  . insulin aspart  0-9 Units Subcutaneous TID WC  . insulin glargine  10 Units Subcutaneous Daily  . levothyroxine  125 mcg Oral QAC breakfast  . metoprolol succinate  100 mg Oral Q supper  . multivitamin  1 tablet Oral QHS  . NIFEdipine  60 mg Oral BID  . pantoprazole  80 mg Oral Q1200  . pravastatin  40 mg Oral QHS  . pregabalin  75 mg Oral BID  . sevelamer carbonate  1,600 mg Oral TID WC   Infusions:  . nitroGLYCERIN  Stopped (11/04/15 0720)   PRN: sodium chloride, sodium chloride, acetaminophen, albuterol, benzonatate, hydrALAZINE, labetalol, [DISCONTINUED] ondansetron **OR** ondansetron (ZOFRAN) IV, [DISCONTINUED] oxyCODONE-acetaminophen **AND** oxyCODONE, sevelamer carbonate Assessment: 30 YOF with PMH of ESRD on HD TTS, hypothyroidism, HTN that presented with SOB and respiratory failure. Pt started on vanc and zosyn for 4 days for rule out pneumonia.  After discussion with MD, would like to de-escalate to levaquin for 1 day (total duration abx = 5 days).   Vanc 2/14>> 2/18 Zosyn 2/14>>2/18 Levaquin 2/18>>(2/18) 2/14 blood x2 >>ngtd  2/14 Nasal MRSA PCR neg  Goal of Therapy:  Eradication of infection   Plan:  Levofloxacin 750 mg PO x 1 to complete 5 days abx Monitor for s/sx of worsening infection  Bennye Alm, PharmD Pharmacy Resident [phone] 11/08/2015,9:26 AM

## 2015-11-08 NOTE — Procedures (Signed)
I have personally attended this patient's dialysis session.   There is a large discrepancy in weight-less so when re-weighed PreHD standing weight is 95.7. Goal is 3 liters now so post weight should be new EDW but won't be as low as we hoped. Previous dry is 96.5 2K bath  (Very frustrated because she still desats when she walks and "just does not understand that")  Jamal Maes, MD [place] Pager 11/08/2015, 3:34 PM

## 2015-11-08 NOTE — Progress Notes (Signed)
[facility] Rounding Note Subjective:  Eating brk'fast   No co walked in room last night no O2   No sob When dc going back to [facility]( [facility])  Objective Vital signs in last 24 hours: Filed Vitals:   11/07/15 0921 11/07/15 1655 11/07/15 1951 11/08/15 0530  BP: 143/60 141/70 143/54 142/55  Pulse: 69 72 64 66  Temp: 97.8 F (36.6 C) 98.8 F (37.1 C) 97.8 F (36.6 C) 97.7 F (36.5 C)  TempSrc: Oral Oral Oral   Resp: 16 16 20 18   Height:      Weight:   98.3 kg (216 lb 11.4 oz)   SpO2: 95% 99% 100% 93%   Weight change: 3.9 kg (8 lb 9.6 oz)  Physical Exam: General: alert/ NAD/ appropriate Heart: RRR/ no m,r,g  Lungs: cta Abdomen: BS + , obese , soft, NT, ND Extremities: no pedal edema Dialysis Access: pos bruit LUA AVF  OP Dialysis Orders: [facility] TTS 4 hr  EDW 96.5 will be much lower at discharge 2 K 2.5 Ca 450/700 left upper AVGG heparin 2500 calcitriol 0.75 last mircera 200 on 1/19 last outpt Hgb 11.2 2/9 no Fe  Problem/Plan: 1. CHF - Decompensated With ? Infiltrate on CXR/hx recent adm in [place] with PNA - EDW lowered this admission almost 6 kg lower   Goal weight 91 kg with HD today. ECHO LVEF normal (2/16) 2. PSVT - onset during coughing this adm; on MTP/s/p adenosine >to NSR/ Followed by Dr. Ellyn Hack  Op  3. ESRD - TTS - K 4.1 - Hyperkalemia on admit sec missed HD/ plans to return to [facility] at dc  4. Anemia - Hgb 10.7 - stable off ESA for now - the last dose at her dialysis unit was 200 Mircera on 1/19 5. Secondary hyperparathyroidism - resumed calcitriol 0.75 - last iPTH in 2/9 was 404/ phos 4.0 On Renvela  6. HTN/volume - BP 142/55 improved with decreased with Volume uf/ + troponins + on admit -probable demand ischemia, / clonidine 0.2 tid / Met100 hs/ Nifedipine 60mg  q d 7. Fever -afeb 24hr - per admit team on empiric Vanc and Zosyn - WBC down to 5.9 ?when to stop ATB's.  8 DM- per primary  Ernest Haber,  PA-C [place] [phone] 11/08/2015,8:19 AM  LOS: 5 days   I have seen and examined this patient and agree with plan and assessment in the above note. Dyspnea clearly improved. We had dropped weight down to about 93 kg and were going for a goal of 91 kg today (well under prior EDW) - floor weight up dramatically to 98,3 kg. Will reweigh pre and post hD (hopefully that floor weight is erroneous).  Primary team still has her on vanco and zosyn as well - need to de-escalate ATB's  Due for CXR today as well. Anticipate discharge in the near future.  She will be going directly back to [place] at discharge - will get info to [place] records to them.  Griselda Bramblett B,MD 11/08/2015 11:58 AM  Labs: Basic Metabolic Panel:  Recent Labs Lab 11/04/15 0500  11/06/15 0600 11/06/15 1500 11/07/15 1058  NA 139  < > 137 135 135  K 5.2*  < > 4.2 4.4 4.1  CL 101  < > 96* 96* 95*  CO2 22  < > 27 26 25   GLUCOSE 120*  < > 132* 106* 160*  BUN 62*  < > 23* 28* 24*  CREATININE 10.84*  < >  6.63* 7.17* 6.17*  CALCIUM 8.4*  < > 8.8* 8.5* 8.5*  PHOS 3.7  --  4.0  --   --   < > = values in this interval not displayed. Liver Function Tests:  Recent Labs Lab 11/03/15 1940 11/04/15 0500 11/06/15 0600  AST 32  --   --   ALT 32  --   --   ALKPHOS 207*  --   --   BILITOT 0.6  --   --   PROT 8.3*  --   --   ALBUMIN 3.6 2.6* 3.0*   CBC:  Recent Labs Lab 11/03/15 1940 11/04/15 0500 11/06/15 0600  WBC 8.7 8.2 5.9  NEUTROABS 6.6  --   --   HGB 13.9 11.6* 10.7*  HCT 43.3 36.0 33.9*  MCV 75.6* 74.8* 73.9*  PLT 120* 110* 96*   Cardiac Enzymes:  Recent Labs Lab 11/04/15 0500 11/04/15 1416 11/04/15 1900  TROPONINI 0.14* 0.12* 0.08*   CBG:  Recent Labs Lab 11/07/15 1115 11/07/15 1615 11/07/15 1949 11/07/15 2119 11/08/15 0811  GLUCAP 153* 129* 151* 146* 158*    Medications: . nitroGLYCERIN Stopped (11/04/15 0720)   . aspirin  81 mg Oral Daily  . bisacodyl  10  mg Rectal Daily  . cloNIDine  0.2 mg Oral TID  . docusate sodium  200 mg Oral BID  . enoxaparin (LOVENOX) injection  30 mg Subcutaneous Q24H  . heparin  4,000 Units Dialysis Once in dialysis  . insulin aspart  0-5 Units Subcutaneous QHS  . insulin aspart  0-9 Units Subcutaneous TID WC  . insulin glargine  10 Units Subcutaneous Daily  . levothyroxine  125 mcg Oral QAC breakfast  . metoprolol succinate  100 mg Oral Q supper  . multivitamin  1 tablet Oral QHS  . NIFEdipine  60 mg Oral BID  . pantoprazole  80 mg Oral Q1200  . piperacillin-tazobactam (ZOSYN)  IV  2.25 g Intravenous 3 times per day  . pravastatin  40 mg Oral QHS  . pregabalin  75 mg Oral BID  . sevelamer carbonate  1,600 mg Oral TID WC  . vancomycin  1,000 mg Intravenous Q T,Th,Sa-HD

## 2015-11-09 DIAGNOSIS — J189 Pneumonia, unspecified organism: Secondary | ICD-10-CM

## 2015-11-09 LAB — CULTURE, BLOOD (ROUTINE X 2)
CULTURE: NO GROWTH
Culture: NO GROWTH

## 2015-11-09 LAB — GLUCOSE, CAPILLARY: Glucose-Capillary: 150 mg/dL — ABNORMAL HIGH (ref 65–99)

## 2015-11-09 MED ORDER — LEVOTHYROXINE SODIUM 25 MCG PO TABS
125.0000 ug | ORAL_TABLET | Freq: Every day | ORAL | Status: DC
  Administered 2015-11-09: 125 ug via ORAL
  Filled 2015-11-09: qty 1

## 2015-11-09 NOTE — Progress Notes (Signed)
Patient returned from dialysis and complained of stomach cramping and nausea. Patient expressed concern that they took off too much fluid in dialysis. RN assured patient that it would subside. RN asked patient if the MD should be contacted to retreive medication for the nausea, patient declined. RN offered ginger ale. RN reassessed patient and patient seemed to be resting with no complaints of stomach cramping/nausea. RN will continue to monitor patient.  Ermalinda Memos, RN

## 2015-11-09 NOTE — Progress Notes (Signed)
Per previous nurse, patient is to walk without oxygen while monitoring o2 saturation. Patient states that she fatigued after dialysis and would like to walk tomorrow during day shift. RN will inform Public house manager.   Ermalinda Memos, RN

## 2015-11-09 NOTE — Progress Notes (Signed)
Discharge instructions and medications discussed with patient.  All questions answered.  

## 2015-11-09 NOTE — Discharge Instructions (Signed)
Please follow up with your scheduled hemodialysis sessions

## 2015-11-09 NOTE — Progress Notes (Signed)
Patient ambulated >75 feet, O2 sats 93-100% on ra.

## 2015-11-09 NOTE — Discharge Summary (Addendum)
Physician Discharge Summary  [name] [id number] DOB: 1961-07-10 DOA: 11/03/2015  PCP: Ricke Hey, MD  Admit date: 11/03/2015 Discharge date: 11/09/2015  Time spent: 20 minutes  Recommendations for Outpatient Follow-up:  1. Follow up with PCP in 2-3 weeks 2. Follow up with scheduled TTS HD in [place], [place]   Discharge Diagnoses:  Principal Problem:   Acute hypoxemic respiratory failure ([place]) Active Problems:   Essential hypertension: Associated with renal disease   Acute respiratory failure with hypoxia (HCC)   End stage renal disease on dialysis due to type 2 diabetes mellitus ([place])   Diabetes mellitus, type II, insulin dependent ([place])   Hypothyroidism (acquired)   Diabetic peripheral neuropathy associated with type 2 diabetes mellitus ([place])   Sinus tachycardia ([place])   HCAP (healthcare-associated pneumonia)   Discharge Condition: Improved  Diet recommendation: Renal, diabetic  Filed Weights   11/08/15 1436 11/08/15 1857 11/09/15 0500  Weight: 95.7 kg (210 lb 15.7 oz) 92.3 kg (203 lb 7.8 oz) 94.1 kg (207 lb 7.3 oz)    History of present illness:  Please see admit h and p from 2/13 for details. Briefly, 55 y.o. female with a past medical history significant for ESRD on HD TThS, IDDM, hypothyroidism, and HTN who presents with dyspnea, patient admitted with concerns for acute volume overload  Hospital Course:  1. Acute hypoxic respiratory failure requiring BiPAP/nonrebreather mask secondary to combination of acutely decompensated grade 2 diastolic heart failure and HCAP present on admission. Due to fluid overload in a ESRD patient. EF of 60% on echogram in January 2016 without any diastolic dysfunction. 2D echo this admission with EF 60-65% with grade 2 diastolic dysfunction. Symptoms improved after dialysis. Patient recently febrile and was started on empiric vanc and zosyn. CXR with questionable findings of edema vs infiltrate. Patient has completed 5 days of  antibiotics. Follow up CXR now clear. Patient ambulated in hallway on room air without difficulty  2. ESRD. Renal on board, being dialyzed, on Tuesday, Thursday and Saturday schedule. Continue dialysis per Nephrology. Fluctuations in weights noted. Patient to follow up at [facility] dialysis center on discharge. HD records to be faxed to patient's dialysis center  3. Hypertensive urgency. Home medications adjusted, have increased Catapres dose, continue beta blocker & Nifedipine, Off NTG drip, as needed IV hydralazine. Blood pressures have improved. Likely due to #1 above.  4. Hyperkalemia. Treated in the ER and now being dialyzed.  5. Dyslipidemia. Continue home dose statin.  6. GERD. On PPI this admission  7. Diabetic neuropathy. On Pregablin continue.  8. Mild nausea. Much improved. Pt constipated. Pt with BM overnight.   9.DM type II on Lantus and sliding scale continue to monitor.  10. Sinus tachycardia. HR to the 150's in the AM of 2/16 after coughing spell. See nursing and RRT notation. Patient failed to improve with IV beta blocker x 2. As patient was symptomatic, Adenosine 6mg  was given with rapid return to NSR. Patient has since remained stable  Consultations:  Nephrology  Discharge Exam: Filed Vitals:   11/08/15 1830 11/08/15 1857 11/09/15 0429 11/09/15 0500  BP: 155/71 160/67 110/41   Pulse: 75 79 61   Temp:  97.9 F (36.6 C) 98 F (36.7 C)   TempSrc:  Oral Oral   Resp:  21    Height:      Weight:  92.3 kg (203 lb 7.8 oz)  94.1 kg (207 lb 7.3 oz)  SpO2:  94% 95%     General: awake, in nad Cardiovascular:  regular, s1, s2 Respiratory: normal resp effort, no wheezing  Discharge Instructions     Medication List    TAKE these medications        aspirin 81 MG chewable tablet  Chew 81 mg by mouth daily.     cloNIDine 0.2 MG tablet  Commonly known as:  CATAPRES  Take 0.2 mg by mouth 2 (two) times daily.     esomeprazole 40 MG capsule  Commonly known  as:  NEXIUM  Take 40 mg by mouth daily before breakfast.     HUMALOG MIX 75/25 KWIKPEN (75-25) 100 UNIT/ML Kwikpen  Generic drug:  Insulin Lispro Prot & Lispro  Inject 20-30 Units into the skin 2 (two) times daily with a meal. Inject 30 units subcutaneously with breakfast and 20 units with supper     levothyroxine 125 MCG tablet  Commonly known as:  SYNTHROID, LEVOTHROID  Take 125 mcg by mouth daily before breakfast.     metoprolol succinate 100 MG 24 hr tablet  Commonly known as:  TOPROL-XL  Take 100 mg by mouth daily with supper. Take with or immediately following a meal.     NIFEdipine 60 MG 24 hr tablet  Commonly known as:  PROCARDIA-XL/ADALAT CC  Take 60 mg by mouth 2 (two) times daily.     oxyCODONE-acetaminophen 10-325 MG tablet  Commonly known as:  PERCOCET  Take 1 tablet by mouth every 6 (six) hours as needed for pain.     pravastatin 40 MG tablet  Commonly known as:  PRAVACHOL  Take 40 mg by mouth at bedtime.     pregabalin 75 MG capsule  Commonly known as:  LYRICA  Take 75 mg by mouth 2 (two) times daily.     promethazine 25 MG tablet  Commonly known as:  PHENERGAN  Take 25 mg by mouth every 8 (eight) hours as needed for nausea or vomiting.     sevelamer carbonate 800 MG tablet  Commonly known as:  RENVELA  Take 800-1,600 mg by mouth 3 (three) times daily with meals. Take 2 tablets (1600 mg) by mouth with meals, and 1 tablet (800 mg) with snacks       No Known Allergies Follow-up Information    Follow up with Ricke Hey, MD. Schedule an appointment as soon as possible for a visit in 2 weeks.   Specialty:  Family Medicine   Why:  Hospital follow up   Contact information:   [place] [address] [phone]        The results of significant diagnostics from this hospitalization (including imaging, microbiology, ancillary and laboratory) are listed below for reference.    Significant Diagnostic Studies: Dg Chest Port 1  View  11/08/2015  CLINICAL DATA:  Followup pneumonia, patient subjectively feels better EXAM: PORTABLE CHEST 1 VIEW COMPARISON:  11/04/2015 FINDINGS: The heart size and vascular pattern are normal. Extensive bilateral infiltrates have resolved. Left subclavian stent noted. No pleural effusion. IMPRESSION: Chest radiograph currently negative Electronically Signed   By: Skipper Cliche M.D.   On: 11/08/2015 20:40   Dg Chest Port 1 View  11/04/2015  CLINICAL DATA:  Productive cough worsening since Monday. Shortness of breath EXAM: PORTABLE CHEST 1 VIEW COMPARISON:  11/03/2015 FINDINGS: Bilateral predominately perihilar airspace opacities are noted, right greater than left. There is mild cardiomegaly. Low lung volumes. No effusions or acute bony abnormality. IMPRESSION: Bilateral perihilar airspace opacities, right greater than left. This could represent asymmetric edema or infection. Electronically Signed  By: Rolm Baptise M.D.   On: 11/04/2015 11:11   Dg Chest Portable 1 View  11/03/2015  CLINICAL DATA:  55 year old female with persistent progressive productive cough and sudden onset shortness of breath despite being on antibiotics for recent diagnosis of pneumonia EXAM: PORTABLE CHEST 1 VIEW COMPARISON:  Prior chest x-ray 06/10/15 FINDINGS: Stable cardiac and mediastinal contours. Diffuse bilateral interstitial and airspace opacities significantly increased compared to prior imaging. No pneumothorax or definite large pleural effusion. Long segment stent overlies the left shoulder girdle. This is likely within the venous system and related to an underlying hemodialysis access. No acute osseous abnormality. IMPRESSION: Mild cardiomegaly with diffuse bilateral interstitial and airspace opacities. The imaging appearance is most suggestive of moderate CHF versus volume overload. Additional differential considerations include atypical or viral pneumonia, less likely multi lobar pneumonia or ARDS. Electronically  Signed   By: Jacqulynn Cadet M.D.   On: 11/03/2015 17:10   Dg Abd Portable 1v  11/04/2015  CLINICAL DATA:  Nausea EXAM: PORTABLE ABDOMEN - 1 VIEW COMPARISON:  None. FINDINGS: Scattered large and small bowel gas is noted. Fecal material is noted throughout the colon consistent with a mild degree of constipation. No abnormal mass is noted. The bony structures are within normal limits. IMPRESSION: Mild constipation. Electronically Signed   By: Inez Catalina M.D.   On: 11/04/2015 11:16    Microbiology: Recent Results (from the past 240 hour(s))  MRSA PCR Screening     Status: None   Collection Time: 11/04/15  3:11 AM  Result Value Ref Range Status   MRSA by PCR NEGATIVE NEGATIVE Final    Comment:        The GeneXpert MRSA Assay (FDA approved for NASAL specimens only), is one component of a comprehensive MRSA colonization surveillance program. It is not intended to diagnose MRSA infection nor to guide or monitor treatment for MRSA infections.   Culture, blood (routine x 2)     Status: None (Preliminary result)   Collection Time: 11/04/15  2:16 PM  Result Value Ref Range Status   Specimen Description BLOOD LEFT ARM  Final   Special Requests IN PEDIATRIC BOTTLE  0.5 CC  Final   Culture NO GROWTH 4 DAYS  Final   Report Status PENDING  Incomplete  Culture, blood (routine x 2)     Status: None (Preliminary result)   Collection Time: 11/04/15  2:25 PM  Result Value Ref Range Status   Specimen Description BLOOD RIGHT HAND  Final   Special Requests IN PEDIATRIC BOTTLE 3 CC  Final   Culture NO GROWTH 4 DAYS  Final   Report Status PENDING  Incomplete     Labs: Basic Metabolic Panel:  Recent Labs Lab 11/04/15 0500 11/05/15 0300 11/06/15 0600 11/06/15 1500 11/07/15 1058 11/08/15 0653  NA 139 136 137 135 135 132*  K 5.2* 5.3* 4.2 4.4 4.1 4.6  CL 101 96* 96* 96* 95* 92*  CO2 22 26 27 26 25 24   GLUCOSE 120* 174* 132* 106* 160* 173*  BUN 62* 40* 23* 28* 24* 36*  CREATININE 10.84*  8.85* 6.63* 7.17* 6.17* 7.97*  CALCIUM 8.4* 8.1* 8.8* 8.5* 8.5* 8.4*  MG  --   --   --   --  1.9  --   PHOS 3.7  --  4.0  --   --   --    Liver Function Tests:  Recent Labs Lab 11/03/15 1940 11/04/15 0500 11/06/15 0600  AST 32  --   --  ALT 32  --   --   ALKPHOS 207*  --   --   BILITOT 0.6  --   --   PROT 8.3*  --   --   ALBUMIN 3.6 2.6* 3.0*   No results for input(s): LIPASE, AMYLASE in the last 168 hours. No results for input(s): AMMONIA in the last 168 hours. CBC:  Recent Labs Lab 11/03/15 1940 11/04/15 0500 11/06/15 0600  WBC 8.7 8.2 5.9  NEUTROABS 6.6  --   --   HGB 13.9 11.6* 10.7*  HCT 43.3 36.0 33.9*  MCV 75.6* 74.8* 73.9*  PLT 120* 110* 96*   Cardiac Enzymes:  Recent Labs Lab 11/04/15 0500 11/04/15 1416 11/04/15 1900  TROPONINI 0.14* 0.12* 0.08*   BNP: BNP (last 3 results)  Recent Labs  11/03/15 1940  BNP 822.1*    ProBNP (last 3 results) No results for input(s): PROBNP in the last 8760 hours.  CBG:  Recent Labs Lab 11/07/15 2119 11/08/15 0811 11/08/15 1139 11/08/15 2110 11/09/15 0746  GLUCAP 146* 158* 136* 108* 150*    Signed:  CHIU, STEPHEN K  [facility] 11/09/2015, 9:14 AM

## 2016-03-27 ENCOUNTER — Emergency Department (HOSPITAL_COMMUNITY): Payer: Medicare Other

## 2016-03-27 ENCOUNTER — Encounter (HOSPITAL_COMMUNITY): Payer: Self-pay | Admitting: Emergency Medicine

## 2016-03-27 ENCOUNTER — Inpatient Hospital Stay (HOSPITAL_COMMUNITY)
Admission: EM | Admit: 2016-03-27 | Discharge: 2016-03-31 | DRG: 189 | Disposition: A | Discharge status: Home / Self Care | Payer: Medicare Other | Attending: Internal Medicine | Admitting: Internal Medicine

## 2016-03-27 DIAGNOSIS — R011 Cardiac murmur, unspecified: Secondary | ICD-10-CM | POA: Diagnosis present

## 2016-03-27 DIAGNOSIS — R609 Edema, unspecified: Secondary | ICD-10-CM

## 2016-03-27 DIAGNOSIS — K219 Gastro-esophageal reflux disease without esophagitis: Secondary | ICD-10-CM | POA: Diagnosis present

## 2016-03-27 DIAGNOSIS — E785 Hyperlipidemia, unspecified: Secondary | ICD-10-CM | POA: Diagnosis present

## 2016-03-27 DIAGNOSIS — D573 Sickle-cell trait: Secondary | ICD-10-CM | POA: Diagnosis present

## 2016-03-27 DIAGNOSIS — Z8249 Family history of ischemic heart disease and other diseases of the circulatory system: Secondary | ICD-10-CM

## 2016-03-27 DIAGNOSIS — R52 Pain, unspecified: Secondary | ICD-10-CM

## 2016-03-27 DIAGNOSIS — Z7982 Long term (current) use of aspirin: Secondary | ICD-10-CM

## 2016-03-27 DIAGNOSIS — W182XXA Fall in (into) shower or empty bathtub, initial encounter: Secondary | ICD-10-CM | POA: Diagnosis present

## 2016-03-27 DIAGNOSIS — E1121 Type 2 diabetes mellitus with diabetic nephropathy: Secondary | ICD-10-CM | POA: Diagnosis present

## 2016-03-27 DIAGNOSIS — Y93E1 Activity, personal bathing and showering: Secondary | ICD-10-CM

## 2016-03-27 DIAGNOSIS — S01511A Laceration without foreign body of lip, initial encounter: Secondary | ICD-10-CM | POA: Diagnosis present

## 2016-03-27 DIAGNOSIS — K3184 Gastroparesis: Secondary | ICD-10-CM | POA: Diagnosis present

## 2016-03-27 DIAGNOSIS — Z87891 Personal history of nicotine dependence: Secondary | ICD-10-CM

## 2016-03-27 DIAGNOSIS — Z79899 Other long term (current) drug therapy: Secondary | ICD-10-CM

## 2016-03-27 DIAGNOSIS — J810 Acute pulmonary edema: Secondary | ICD-10-CM | POA: Diagnosis present

## 2016-03-27 DIAGNOSIS — I10 Essential (primary) hypertension: Secondary | ICD-10-CM | POA: Diagnosis present

## 2016-03-27 DIAGNOSIS — N186 End stage renal disease: Secondary | ICD-10-CM | POA: Diagnosis present

## 2016-03-27 DIAGNOSIS — E039 Hypothyroidism, unspecified: Secondary | ICD-10-CM | POA: Diagnosis present

## 2016-03-27 DIAGNOSIS — E1143 Type 2 diabetes mellitus with diabetic autonomic (poly)neuropathy: Secondary | ICD-10-CM | POA: Diagnosis present

## 2016-03-27 DIAGNOSIS — E079 Disorder of thyroid, unspecified: Secondary | ICD-10-CM | POA: Diagnosis present

## 2016-03-27 DIAGNOSIS — Z833 Family history of diabetes mellitus: Secondary | ICD-10-CM

## 2016-03-27 DIAGNOSIS — E8770 Fluid overload, unspecified: Secondary | ICD-10-CM | POA: Diagnosis present

## 2016-03-27 DIAGNOSIS — E1122 Type 2 diabetes mellitus with diabetic chronic kidney disease: Secondary | ICD-10-CM

## 2016-03-27 DIAGNOSIS — J9601 Acute respiratory failure with hypoxia: Secondary | ICD-10-CM | POA: Diagnosis not present

## 2016-03-27 DIAGNOSIS — I5033 Acute on chronic diastolic (congestive) heart failure: Secondary | ICD-10-CM | POA: Diagnosis present

## 2016-03-27 DIAGNOSIS — I471 Supraventricular tachycardia: Secondary | ICD-10-CM | POA: Diagnosis not present

## 2016-03-27 DIAGNOSIS — E1151 Type 2 diabetes mellitus with diabetic peripheral angiopathy without gangrene: Secondary | ICD-10-CM | POA: Diagnosis present

## 2016-03-27 DIAGNOSIS — E1142 Type 2 diabetes mellitus with diabetic polyneuropathy: Secondary | ICD-10-CM | POA: Diagnosis present

## 2016-03-27 DIAGNOSIS — N189 Chronic kidney disease, unspecified: Secondary | ICD-10-CM

## 2016-03-27 DIAGNOSIS — Z794 Long term (current) use of insulin: Secondary | ICD-10-CM

## 2016-03-27 DIAGNOSIS — Z992 Dependence on renal dialysis: Secondary | ICD-10-CM

## 2016-03-27 DIAGNOSIS — D696 Thrombocytopenia, unspecified: Secondary | ICD-10-CM | POA: Diagnosis present

## 2016-03-27 DIAGNOSIS — D631 Anemia in chronic kidney disease: Secondary | ICD-10-CM

## 2016-03-27 DIAGNOSIS — W19XXXA Unspecified fall, initial encounter: Secondary | ICD-10-CM

## 2016-03-27 DIAGNOSIS — I132 Hypertensive heart and chronic kidney disease with heart failure and with stage 5 chronic kidney disease, or end stage renal disease: Secondary | ICD-10-CM | POA: Diagnosis present

## 2016-03-27 MED ORDER — ALBUTEROL SULFATE (2.5 MG/3ML) 0.083% IN NEBU
5.0000 mg | INHALATION_SOLUTION | Freq: Once | RESPIRATORY_TRACT | Status: AC
  Administered 2016-03-27: 5 mg via RESPIRATORY_TRACT
  Filled 2016-03-27: qty 6

## 2016-03-27 NOTE — ED Notes (Signed)
Attempted IV without success X 2.  IV team consulted.

## 2016-03-27 NOTE — ED Provider Notes (Signed)
CSN: [id number]     Arrival date & time 03/27/16  2242 History   First MD Initiated Contact with Patient 03/27/16 2253     Chief Complaint  Patient presents with  . Shortness of Breath     (Consider location/radiation/quality/duration/timing/severity/associated sxs/prior Treatment) Patient is a 55 y.o. female presenting with shortness of breath. The history is provided by the patient.  Shortness of Breath She has a history of diabetes, hypertension, end-stage renal disease on hemodialysis. She comes in because of difficulty breathing which started tonight. She has been hospitalized several times for pneumonia-most recently 2 weeks ago. She had been discharged on an antibiotic but she's not sure which antibiotic was. She was doing well until last night when she started coughing. Tonight, she states that she slipped and the foot shower and hit her head and has been having worsening difficulty breathing since then. Her cough is productive of a small amount of clear sputum. She denies fever, chills, sweats. She denies chest pain, heaviness, tightness, pressure. She was last dialyzed yesterday.  Past Medical History  Diagnosis Date  . Diabetes mellitus, type II, insulin dependent ([place])   . Paroxysmal supraventricular tachycardia ([place])   . End stage renal disease on dialysis ([place])     Hemodialysis T, TH, Sat  . Hypothyroidism (acquired)   . Essential hypertension     Difficult control. On multiple medications.  . Diabetic peripheral neuropathy associated with type 2 diabetes mellitus ([place])   . Peripheral arterial disease [facility]) December 2014    @ [facility]: a) RSFA PTA (01/2013); b) L SFA [place] Atherectomy/PTA with 3V runoff; LEA Dopplers January 2016 [facility] post L SFA PTA): Mild, insignificant disease in the left CFA, profunda, SFA, popliteal artery and tibioperoneal trunk.  . 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 Hemodialysis  patient ([place])   . Hyperlipidemia   . Sickle cell trait ([place])   . Gastroparesis due to DM ([place])   . GERD (gastroesophageal reflux disease)   . Heart murmur   . Arthritis    Past Surgical History  Procedure Laterality Date  . Abdominal hysterectomy    . Knee surgery      right  . Thyroid surgery    . Thyroidectomy    . Knee arthroplasty    . Cataract extraction, bilateral    . Colonoscopy  07/19/2012    Procedure: COLONOSCOPY;  Surgeon: Beryle Beams, MD;  Location: [place];  Service: Endoscopy;  Laterality: N/A;  . Arteriovenous graft placement Left 2012    Dr. Harrington Challenger  2nd graft  . Shuntogram Left 02/14/2013    Procedure: FISTULOGRAM;  Surgeon: Serafina Mitchell, MD;  Location: [facility] CATH LAB;  Service: Cardiovascular;  Laterality: Left;  . Nm myoview ltd  September 2009; 01/15/2015    a) @ [facility] - Negative for inducible or reversible ischemia with pharmacologic stress. EF 63% ; b) [facility]: EF 68%. No RWMA, negative S4 reversible coronary ischemia or infarction.  . Carotid ultrasound   May 2011    Normal ([place], [place])  . Transthoracic echocardiogram  October 2013; January 2015    a) [name]) 2013: normal LV size and function. Mild LVH. EF 55-60%. no RWMA, No valve disease; b) [facility] 1/'15: EF 70-75%, mild MR. Normal diastolic dysfunction  . Peripheral vascular catheterization  May 2014; December 2014    a) RSFA & PopA mod-severe disease -- R SFA PTA, Med Rx of  L SFA; b) distal abdominal and iliac & CFA arteries patent.  m-d LSFA long 60%, LPop 60% -> 3 V runoff. RSFA ~40% mid, RPop 60%, 3V runoff  . Peripheral angioplasty  May 2014    PTA of R SFA-PopA.  [facility] Peripheral athrectomy  December 2014    [place] Atherectomy of L SFA-L PopA reduction of 60% to 10% stenosis   . Fistulogram Left 06/04/2015    Procedure: FISTULOGRAM;  Surgeon: Conrad [place], MD;  Location: [facility] OR;  Service: Vascular;  Laterality: Left;  . Revision of arteriovenous  goretex graft Left 06/04/2015    Procedure: REVISION OF ARTERIOVENOUS GORETEX GRAFT;  Surgeon: Conrad Sanderson, MD;  Location: [place];  Service: Vascular;  Laterality: Left;   Family History  Problem Relation Age of Onset  . Diabetes Mother   . Hypertension Mother   . Heart attack Mother   . Diabetes Father   . Hypertension Father    Social History  Substance Use Topics  . Smoking status: Former Smoker    Types: Cigarettes    Quit date: 09/20/1993  . Smokeless tobacco: Never Used  . Alcohol Use: No   OB History    Gravida Para Term Preterm AB TAB SAB Ectopic Multiple Living            3     Review of Systems  Respiratory: Positive for shortness of breath.   All other systems reviewed and are negative.     Allergies  Review of patient's allergies indicates no known allergies.  Home Medications   Prior to Admission medications   Medication Sig Start Date End Date Taking? Authorizing Provider  aspirin 81 MG chewable tablet Chew 81 mg by mouth daily.    Historical Provider, MD  cloNIDine (CATAPRES) 0.2 MG tablet Take 0.2 mg by mouth 2 (two) times daily.     Historical Provider, MD  esomeprazole (NEXIUM) 40 MG capsule Take 40 mg by mouth daily before breakfast.    Historical Provider, MD  Insulin Lispro Prot & Lispro (HUMALOG MIX 75/25 KWIKPEN) (75-25) 100 UNIT/ML Kwikpen Inject 20-30 Units into the skin 2 (two) times daily with a meal. Inject 30 units subcutaneously with breakfast and 20 units with supper    Historical Provider, MD  levothyroxine (SYNTHROID, LEVOTHROID) 125 MCG tablet Take 125 mcg by mouth daily before breakfast.     Historical Provider, MD  metoprolol succinate (TOPROL-XL) 100 MG 24 hr tablet Take 100 mg by mouth daily with supper. Take with or immediately following a meal.    Historical Provider, MD  NIFEdipine (PROCARDIA-XL/ADALAT CC) 60 MG 24 hr tablet Take 60 mg by mouth 2 (two) times daily. 10/16/15   Historical Provider, MD  oxyCODONE-acetaminophen  (PERCOCET) 10-325 MG per tablet Take 1 tablet by mouth every 6 (six) hours as needed for pain. 06/04/15   Samantha J Rhyne, PA-C  pravastatin (PRAVACHOL) 40 MG tablet Take 40 mg by mouth at bedtime.     Historical Provider, MD  pregabalin (LYRICA) 75 MG capsule Take 75 mg by mouth 2 (two) times daily.    Historical Provider, MD  promethazine (PHENERGAN) 25 MG tablet Take 25 mg by mouth every 8 (eight) hours as needed for nausea or vomiting.     Historical Provider, MD  sevelamer carbonate (RENVELA) 800 MG tablet Take 800-1,600 mg by mouth 3 (three) times daily with meals. Take 2 tablets (1600 mg) by mouth with meals, and 1 tablet (800 mg) with snacks    Historical  Provider, MD   Pulse 95  Temp(Src) 97.8 F (36.6 C) (Oral)  Resp 25  SpO2 95% Physical Exam  Nursing note and vitals reviewed.  55 year old female, in moderate respiratory distress. She is using accessory muscles of respiration but can speak in complete sentences. Vital signs are significant for tachypnea. Oxygen saturation is 50%, which is hypoxic, but rose to 95% when placed on a 100% oxygen nonrebreather mask. Head is normocephalic and atraumatic. PERRLA, EOMI. Oropharynx is clear. Neck is nontender and supple without adenopathy or JVD. Back is nontender and there is no CVA tenderness. Lungs have bibasilar rales halfway up, and scattered expiratory wheezes. Chest is nontender. Heart has regular rate and rhythm without murmur. Abdomen is soft, flat, nontender without masses or hepatosplenomegaly and peristalsis is normoactive. Extremities have 2+ edema, full range of motion is present. AV fistula is present in left upper arm with thrill present. Skin is warm and dry without rash. Neurologic: Mental status is normal, cranial nerves are intact, there are no motor or sensory deficits.  ED Course  Procedures (including critical care time) Labs Review Results for orders placed or performed during the hospital encounter of [id number]   Basic metabolic panel  Result Value Ref Range   Sodium 136 135 - 145 mmol/L   Potassium 5.0 3.5 - 5.1 mmol/L   Chloride 102 101 - 111 mmol/L   CO2 23 22 - 32 mmol/L   Glucose, Bld 199 (H) 65 - 99 mg/dL   BUN 54 (H) 6 - 20 mg/dL   Creatinine, Ser 8.50 (H) 0.44 - 1.00 mg/dL   Calcium 8.3 (L) 8.9 - 10.3 mg/dL   GFR calc non Af Amer 5 (L) >60 mL/min   GFR calc Af Amer 5 (L) >60 mL/min   Anion gap 11 5 - 15  CBC with Differential  Result Value Ref Range   WBC 6.5 4.0 - 10.5 K/uL   RBC 3.77 (L) 3.87 - 5.11 MIL/uL   Hemoglobin 8.9 (L) 12.0 - 15.0 g/dL   HCT 28.3 (L) 36.0 - 46.0 %   MCV 75.1 (L) 78.0 - 100.0 fL   MCH 23.6 (L) 26.0 - 34.0 pg   MCHC 31.4 30.0 - 36.0 g/dL   RDW 20.0 (H) 11.5 - 15.5 %   Platelets 115 (L) 150 - 400 K/uL   Neutrophils Relative % 65 %   Neutro Abs 4.2 1.7 - 7.7 K/uL   Lymphocytes Relative 28 %   Lymphs Abs 1.8 0.7 - 4.0 K/uL   Monocytes Relative 5 %   Monocytes Absolute 0.3 0.1 - 1.0 K/uL   Eosinophils Relative 2 %   Eosinophils Absolute 0.2 0.0 - 0.7 K/uL   Basophils Relative 0 %   Basophils Absolute 0.0 0.0 - 0.1 K/uL  Troponin I  Result Value Ref Range   Troponin I <0.03 <0.03 ng/mL   Imaging Review Dg Chest Port 1 View  03/27/2016  CLINICAL DATA:  Dyspnea. EXAM: PORTABLE CHEST 1 VIEW COMPARISON:  Most recent chest radiograph 11/08/2015, additional prior exams reviewed FINDINGS: Cardiomegaly with stable mediastinal contours. Bilateral perihilar and infrahilar opacities, right greater than left. No large pleural effusion. No pneumothorax. Unchanged osseous structures. IMPRESSION: Bilateral perihilar and infrahilar opacities, right greater than left, may be pulmonary edema or pneumonia. Electronically Signed   By: Jeb Levering M.D.   On: 03/27/2016 23:16   I have personally reviewed and evaluated these images and lab results as part of my medical decision-making.   EKG Interpretation  Date/Time:  Saturday March 27 2016 22:50:58 EDT Ventricular  Rate:  94 PR Interval:  182 QRS Duration: 80 QT Interval:  370 QTC Calculation: 462 R Axis:   78 Text Interpretation:  Normal sinus rhythm Anterior infarct , age  undetermined Abnormal ECG When compared with ECG of 11/06/2015, Sinus  rhythm has replaced Supraventricular tachycardia Confirmed by Roxanne Mins  MD,  Carneshia Raker ([id number]) on 03/27/2016 11:02:17 PM      CRITICAL CARE Performed by: [id number] Total critical care time: 40 minutes Critical care time was exclusive of separately billable procedures and treating other patients. Critical care was necessary to treat or prevent imminent or life-threatening deterioration. Critical care was time spent personally by me on the following activities: development of treatment plan with patient and/or surrogate as well as nursing, discussions with consultants, evaluation of patient's response to treatment, examination of patient, obtaining history from patient or surrogate, ordering and performing treatments and interventions, ordering and review of laboratory studies, ordering and review of radiographic studies, pulse oximetry and re-evaluation of patient's condition. MDM   Final diagnoses:  Acute pulmonary edema (HCC)  End-stage renal disease on hemodialysis (HCC)  Anemia of renal disease  Thrombocytopenia (HCC)    Acute dyspnea with findings more consistent with CHF and pneumonia. Old records are reviewed and she was hospitalized for pneumonia in February. Records from hospitalization at [facility] 2 weeks ago are not available. Chest x-ray will be obtained. The patient may need emergent dialysis.  Oxygen saturations have stabilized in an adequate range while on 100% oxygen. She is no longer using accessory muscles of respiration. Chest x-ray is consistent with pulmonary edema. Radiologist interpretation suggests that it may be pneumonia, but she does not have a fever and cough is nonproductive of. Sputum. She has other signs of fluid overload. She is  not stable to go home. She will need to be admitted pending dialysis. I discussed case with Dr. Jonnie Finner of [facility] who agrees to do dialysis in the morning. Have discussed case with Dr. Tamala Julian of [facility] who agrees to admit the patient.    Delora Fuel, MD [phone]

## 2016-03-27 NOTE — ED Notes (Signed)
Pt c/o SOB, pt difficulty catching her breath. Saturations 50% with a good waveform. Pt placed on NRB. Pt AAOX4. Pt states shes been battling pneumonia.

## 2016-03-27 NOTE — ED Notes (Signed)
IV team at the bedside. 

## 2016-03-28 ENCOUNTER — Observation Stay (HOSPITAL_COMMUNITY): Payer: Medicare Other

## 2016-03-28 DIAGNOSIS — E1122 Type 2 diabetes mellitus with diabetic chronic kidney disease: Secondary | ICD-10-CM | POA: Diagnosis present

## 2016-03-28 DIAGNOSIS — J9601 Acute respiratory failure with hypoxia: Secondary | ICD-10-CM | POA: Diagnosis present

## 2016-03-28 DIAGNOSIS — K219 Gastro-esophageal reflux disease without esophagitis: Secondary | ICD-10-CM | POA: Diagnosis present

## 2016-03-28 DIAGNOSIS — E785 Hyperlipidemia, unspecified: Secondary | ICD-10-CM | POA: Diagnosis present

## 2016-03-28 DIAGNOSIS — I10 Essential (primary) hypertension: Secondary | ICD-10-CM | POA: Diagnosis not present

## 2016-03-28 DIAGNOSIS — Z8249 Family history of ischemic heart disease and other diseases of the circulatory system: Secondary | ICD-10-CM | POA: Diagnosis not present

## 2016-03-28 DIAGNOSIS — Z794 Long term (current) use of insulin: Secondary | ICD-10-CM | POA: Diagnosis not present

## 2016-03-28 DIAGNOSIS — I471 Supraventricular tachycardia: Secondary | ICD-10-CM | POA: Diagnosis not present

## 2016-03-28 DIAGNOSIS — I132 Hypertensive heart and chronic kidney disease with heart failure and with stage 5 chronic kidney disease, or end stage renal disease: Secondary | ICD-10-CM | POA: Diagnosis present

## 2016-03-28 DIAGNOSIS — E8770 Fluid overload, unspecified: Secondary | ICD-10-CM | POA: Diagnosis present

## 2016-03-28 DIAGNOSIS — I5033 Acute on chronic diastolic (congestive) heart failure: Secondary | ICD-10-CM | POA: Diagnosis present

## 2016-03-28 DIAGNOSIS — K3184 Gastroparesis: Secondary | ICD-10-CM | POA: Diagnosis present

## 2016-03-28 DIAGNOSIS — Z833 Family history of diabetes mellitus: Secondary | ICD-10-CM | POA: Diagnosis not present

## 2016-03-28 DIAGNOSIS — E1151 Type 2 diabetes mellitus with diabetic peripheral angiopathy without gangrene: Secondary | ICD-10-CM | POA: Diagnosis present

## 2016-03-28 DIAGNOSIS — Z87891 Personal history of nicotine dependence: Secondary | ICD-10-CM | POA: Diagnosis not present

## 2016-03-28 DIAGNOSIS — E039 Hypothyroidism, unspecified: Secondary | ICD-10-CM | POA: Diagnosis present

## 2016-03-28 DIAGNOSIS — E079 Disorder of thyroid, unspecified: Secondary | ICD-10-CM

## 2016-03-28 DIAGNOSIS — E1142 Type 2 diabetes mellitus with diabetic polyneuropathy: Secondary | ICD-10-CM | POA: Diagnosis present

## 2016-03-28 DIAGNOSIS — R011 Cardiac murmur, unspecified: Secondary | ICD-10-CM | POA: Diagnosis present

## 2016-03-28 DIAGNOSIS — Z992 Dependence on renal dialysis: Secondary | ICD-10-CM | POA: Diagnosis not present

## 2016-03-28 DIAGNOSIS — E1143 Type 2 diabetes mellitus with diabetic autonomic (poly)neuropathy: Secondary | ICD-10-CM | POA: Diagnosis present

## 2016-03-28 DIAGNOSIS — J810 Acute pulmonary edema: Secondary | ICD-10-CM | POA: Diagnosis present

## 2016-03-28 DIAGNOSIS — R609 Edema, unspecified: Secondary | ICD-10-CM

## 2016-03-28 DIAGNOSIS — D696 Thrombocytopenia, unspecified: Secondary | ICD-10-CM | POA: Diagnosis present

## 2016-03-28 DIAGNOSIS — Z79899 Other long term (current) drug therapy: Secondary | ICD-10-CM | POA: Diagnosis not present

## 2016-03-28 DIAGNOSIS — D573 Sickle-cell trait: Secondary | ICD-10-CM | POA: Diagnosis present

## 2016-03-28 DIAGNOSIS — D631 Anemia in chronic kidney disease: Secondary | ICD-10-CM | POA: Diagnosis present

## 2016-03-28 DIAGNOSIS — S01511A Laceration without foreign body of lip, initial encounter: Secondary | ICD-10-CM | POA: Diagnosis present

## 2016-03-28 DIAGNOSIS — E1121 Type 2 diabetes mellitus with diabetic nephropathy: Secondary | ICD-10-CM | POA: Diagnosis present

## 2016-03-28 DIAGNOSIS — R0600 Dyspnea, unspecified: Secondary | ICD-10-CM | POA: Diagnosis not present

## 2016-03-28 DIAGNOSIS — Z7982 Long term (current) use of aspirin: Secondary | ICD-10-CM | POA: Diagnosis not present

## 2016-03-28 DIAGNOSIS — N186 End stage renal disease: Secondary | ICD-10-CM | POA: Diagnosis present

## 2016-03-28 DIAGNOSIS — W182XXA Fall in (into) shower or empty bathtub, initial encounter: Secondary | ICD-10-CM | POA: Diagnosis present

## 2016-03-28 DIAGNOSIS — Y93E1 Activity, personal bathing and showering: Secondary | ICD-10-CM | POA: Diagnosis not present

## 2016-03-28 LAB — BLOOD GAS, ARTERIAL
ACID-BASE EXCESS: 0.4 mmol/L (ref 0.0–2.0)
Bicarbonate: 24.1 mEq/L — ABNORMAL HIGH (ref 20.0–24.0)
DRAWN BY: 252031
O2 CONTENT: 6 L/min
O2 Saturation: 83.6 %
PATIENT TEMPERATURE: 98.6
TCO2: 25.2 mmol/L (ref 0–100)
pCO2 arterial: 36.4 mmHg (ref 35.0–45.0)
pH, Arterial: 7.436 (ref 7.350–7.450)
pO2, Arterial: 52.2 mmHg — ABNORMAL LOW (ref 80.0–100.0)

## 2016-03-28 LAB — COMPREHENSIVE METABOLIC PANEL
ALT: 14 U/L (ref 14–54)
AST: 18 U/L (ref 15–41)
Albumin: 3.5 g/dL (ref 3.5–5.0)
Alkaline Phosphatase: 149 U/L — ABNORMAL HIGH (ref 38–126)
Anion gap: 11 (ref 5–15)
BUN: 54 mg/dL — AB (ref 6–20)
CHLORIDE: 102 mmol/L (ref 101–111)
CO2: 24 mmol/L (ref 22–32)
CREATININE: 8.54 mg/dL — AB (ref 0.44–1.00)
Calcium: 8.3 mg/dL — ABNORMAL LOW (ref 8.9–10.3)
GFR, EST AFRICAN AMERICAN: 5 mL/min — AB (ref >60)
GFR, EST NON AFRICAN AMERICAN: 5 mL/min — AB (ref >60)
Glucose, Bld: 197 mg/dL — ABNORMAL HIGH (ref 65–99)
POTASSIUM: 5 mmol/L (ref 3.5–5.1)
SODIUM: 137 mmol/L (ref 135–145)
Total Bilirubin: 0.6 mg/dL (ref 0.3–1.2)
Total Protein: 7 g/dL (ref 6.5–8.1)

## 2016-03-28 LAB — BASIC METABOLIC PANEL
ANION GAP: 11 (ref 5–15)
BUN: 54 mg/dL — ABNORMAL HIGH (ref 6–20)
CO2: 23 mmol/L (ref 22–32)
Calcium: 8.3 mg/dL — ABNORMAL LOW (ref 8.9–10.3)
Chloride: 102 mmol/L (ref 101–111)
Creatinine, Ser: 8.5 mg/dL — ABNORMAL HIGH (ref 0.44–1.00)
GFR calc Af Amer: 5 mL/min — ABNORMAL LOW (ref >60)
GFR, EST NON AFRICAN AMERICAN: 5 mL/min — AB (ref >60)
GLUCOSE: 199 mg/dL — AB (ref 65–99)
POTASSIUM: 5 mmol/L (ref 3.5–5.1)
Sodium: 136 mmol/L (ref 135–145)

## 2016-03-28 LAB — CBC WITH DIFFERENTIAL/PLATELET
BASOS ABS: 0 10*3/uL (ref 0.0–0.1)
BASOS PCT: 0 %
EOS ABS: 0.2 10*3/uL (ref 0.0–0.7)
Eosinophils Relative: 2 %
HCT: 28.3 % — ABNORMAL LOW (ref 36.0–46.0)
HEMOGLOBIN: 8.9 g/dL — AB (ref 12.0–15.0)
Lymphocytes Relative: 28 %
Lymphs Abs: 1.8 10*3/uL (ref 0.7–4.0)
MCH: 23.6 pg — ABNORMAL LOW (ref 26.0–34.0)
MCHC: 31.4 g/dL (ref 30.0–36.0)
MCV: 75.1 fL — ABNORMAL LOW (ref 78.0–100.0)
Monocytes Absolute: 0.3 10*3/uL (ref 0.1–1.0)
Monocytes Relative: 5 %
NEUTROS PCT: 65 %
Neutro Abs: 4.2 10*3/uL (ref 1.7–7.7)
Platelets: 115 10*3/uL — ABNORMAL LOW (ref 150–400)
RBC: 3.77 MIL/uL — AB (ref 3.87–5.11)
RDW: 20 % — ABNORMAL HIGH (ref 11.5–15.5)
WBC: 6.5 10*3/uL (ref 4.0–10.5)

## 2016-03-28 LAB — GLUCOSE, CAPILLARY
GLUCOSE-CAPILLARY: 50 mg/dL — AB (ref 65–99)
GLUCOSE-CAPILLARY: 103 mg/dL — AB (ref 65–99)
Glucose-Capillary: 176 mg/dL — ABNORMAL HIGH (ref 65–99)
Glucose-Capillary: 57 mg/dL — ABNORMAL LOW (ref 65–99)
Glucose-Capillary: 72 mg/dL (ref 65–99)
Glucose-Capillary: 138 mg/dL — ABNORMAL HIGH (ref 65–99)

## 2016-03-28 LAB — PROCALCITONIN: PROCALCITONIN: 0.4 ng/mL

## 2016-03-28 LAB — SEDIMENTATION RATE: Sed Rate: 26 mm/hr — ABNORMAL HIGH (ref 0–22)

## 2016-03-28 LAB — TROPONIN I: Troponin I: <0.03 ng/mL (ref <0.03)

## 2016-03-28 LAB — MRSA PCR SCREENING: MRSA BY PCR: NEGATIVE

## 2016-03-28 MED ORDER — OXYCODONE HCL 5 MG PO TABS
5.0000 mg | ORAL_TABLET | ORAL | Status: DC | PRN
  Administered 2016-03-29: 5 mg via ORAL
  Administered 2016-03-29: 10 mg via ORAL
  Administered 2016-03-30: 5 mg via ORAL
  Filled 2016-03-28 (×2): qty 2
  Filled 2016-03-28: qty 1

## 2016-03-28 MED ORDER — INSULIN ASPART PROT & ASPART (70-30 MIX) 100 UNIT/ML ~~LOC~~ SUSP
20.0000 [IU] | Freq: Every day | SUBCUTANEOUS | Status: DC
  Filled 2016-03-28: qty 10

## 2016-03-28 MED ORDER — LEVALBUTEROL HCL 0.63 MG/3ML IN NEBU: 0.6300 mg | INHALATION_SOLUTION | RESPIRATORY_TRACT | Status: DC | PRN

## 2016-03-28 MED ORDER — PANTOPRAZOLE SODIUM 40 MG PO TBEC
80.0000 mg | DELAYED_RELEASE_TABLET | Freq: Every day | ORAL | Status: DC
  Administered 2016-03-28 – 2016-03-31 (×4): 80 mg via ORAL
  Filled 2016-03-28 (×4): qty 2

## 2016-03-28 MED ORDER — CLONIDINE HCL 0.2 MG PO TABS
0.2000 mg | ORAL_TABLET | Freq: Three times a day (TID) | ORAL | Status: DC
  Administered 2016-03-28 – 2016-03-31 (×9): 0.2 mg via ORAL
  Filled 2016-03-28 (×10): qty 1

## 2016-03-28 MED ORDER — HEPARIN SODIUM (PORCINE) 5000 UNIT/ML IJ SOLN
5000.0000 [IU] | Freq: Three times a day (TID) | INTRAMUSCULAR | Status: DC
  Administered 2016-03-28 – 2016-03-30 (×7): 5000 [IU] via SUBCUTANEOUS
  Filled 2016-03-28 (×8): qty 1

## 2016-03-28 MED ORDER — SODIUM CHLORIDE 0.9 % IV SOLN: 100.0000 mL | INTRAVENOUS | Status: DC | PRN

## 2016-03-28 MED ORDER — PENTAFLUOROPROP-TETRAFLUOROETH EX AERO: 1.0000 "application " | INHALATION_SPRAY | CUTANEOUS | Status: DC | PRN

## 2016-03-28 MED ORDER — LIDOCAINE HCL (PF) 1 % IJ SOLN: 5.0000 mL | INTRAMUSCULAR | Status: DC | PRN

## 2016-03-28 MED ORDER — OXYCODONE HCL 5 MG PO TABS
5.0000 mg | ORAL_TABLET | Freq: Four times a day (QID) | ORAL | Status: DC | PRN
  Administered 2016-03-28: 5 mg via ORAL
  Filled 2016-03-28: qty 1

## 2016-03-28 MED ORDER — PROMETHAZINE HCL 25 MG PO TABS
25.0000 mg | ORAL_TABLET | Freq: Three times a day (TID) | ORAL | Status: DC | PRN
  Administered 2016-03-29 – 2016-03-30 (×2): 25 mg via ORAL
  Filled 2016-03-28 (×2): qty 1

## 2016-03-28 MED ORDER — LEVOTHYROXINE SODIUM 25 MCG PO TABS
125.0000 ug | ORAL_TABLET | Freq: Every day | ORAL | Status: DC
  Administered 2016-03-28 – 2016-03-29 (×2): 125 ug via ORAL
  Filled 2016-03-28 (×2): qty 1

## 2016-03-28 MED ORDER — NIFEDIPINE ER 60 MG PO TB24
60.0000 mg | ORAL_TABLET | Freq: Two times a day (BID) | ORAL | Status: DC
  Administered 2016-03-28 – 2016-03-29 (×2): 60 mg via ORAL
  Filled 2016-03-28 (×3): qty 1

## 2016-03-28 MED ORDER — LEVOTHYROXINE SODIUM 125 MCG PO TABS
125.0000 ug | ORAL_TABLET | Freq: Every day | ORAL | Status: DC
  Filled 2016-03-28: qty 1

## 2016-03-28 MED ORDER — GUAIFENESIN ER 600 MG PO TB12
600.0000 mg | ORAL_TABLET | Freq: Two times a day (BID) | ORAL | Status: DC | PRN
  Filled 2016-03-28: qty 1

## 2016-03-28 MED ORDER — ONDANSETRON HCL 4 MG/2ML IJ SOLN
4.0000 mg | Freq: Four times a day (QID) | INTRAMUSCULAR | Status: DC | PRN
  Administered 2016-03-28: 4 mg via INTRAVENOUS
  Filled 2016-03-28: qty 2

## 2016-03-28 MED ORDER — ALBUTEROL SULFATE (2.5 MG/3ML) 0.083% IN NEBU
2.5000 mg | INHALATION_SOLUTION | RESPIRATORY_TRACT | Status: DC | PRN
  Administered 2016-03-28: 2.5 mg via RESPIRATORY_TRACT
  Filled 2016-03-28: qty 3

## 2016-03-28 MED ORDER — PRAVASTATIN SODIUM 40 MG PO TABS
40.0000 mg | ORAL_TABLET | Freq: Every day | ORAL | Status: DC
  Administered 2016-03-28 – 2016-03-30 (×3): 40 mg via ORAL
  Filled 2016-03-28 (×3): qty 1

## 2016-03-28 MED ORDER — SEVELAMER CARBONATE 800 MG PO TABS
800.0000 mg | ORAL_TABLET | Freq: Two times a day (BID) | ORAL | Status: DC | PRN
  Filled 2016-03-28: qty 1

## 2016-03-28 MED ORDER — HYDRALAZINE HCL 20 MG/ML IJ SOLN: 10.0000 mg | INTRAMUSCULAR | Status: DC | PRN

## 2016-03-28 MED ORDER — SEVELAMER CARBONATE 800 MG PO TABS
1600.0000 mg | ORAL_TABLET | Freq: Three times a day (TID) | ORAL | Status: DC
  Administered 2016-03-28 – 2016-03-31 (×10): 1600 mg via ORAL
  Filled 2016-03-28 (×11): qty 2

## 2016-03-28 MED ORDER — OXYCODONE-ACETAMINOPHEN 5-325 MG PO TABS: 1.0000 | ORAL_TABLET | Freq: Four times a day (QID) | ORAL | Status: DC | PRN

## 2016-03-28 MED ORDER — FUROSEMIDE 10 MG/ML IJ SOLN
80.0000 mg | Freq: Once | INTRAMUSCULAR | Status: AC
  Administered 2016-03-28: 80 mg via INTRAVENOUS
  Filled 2016-03-28: qty 8

## 2016-03-28 MED ORDER — HEPARIN SODIUM (PORCINE) 1000 UNIT/ML DIALYSIS: 1000.0000 [IU] | INTRAMUSCULAR | Status: DC | PRN

## 2016-03-28 MED ORDER — IPRATROPIUM BROMIDE 0.02 % IN SOLN: 0.5000 mg | RESPIRATORY_TRACT | Status: DC | PRN

## 2016-03-28 MED ORDER — ACETAMINOPHEN 650 MG RE SUPP: 650.0000 mg | Freq: Four times a day (QID) | RECTAL | Status: DC | PRN

## 2016-03-28 MED ORDER — PREGABALIN 75 MG PO CAPS
75.0000 mg | ORAL_CAPSULE | Freq: Two times a day (BID) | ORAL | Status: DC
  Administered 2016-03-28 – 2016-03-31 (×6): 75 mg via ORAL
  Filled 2016-03-28 (×6): qty 1

## 2016-03-28 MED ORDER — ASPIRIN 81 MG PO CHEW
81.0000 mg | CHEWABLE_TABLET | Freq: Every day | ORAL | Status: DC
  Administered 2016-03-28 – 2016-03-31 (×4): 81 mg via ORAL
  Filled 2016-03-28 (×4): qty 1

## 2016-03-28 MED ORDER — NITROGLYCERIN 2 % TD OINT
1.0000 [in_us] | TOPICAL_OINTMENT | Freq: Four times a day (QID) | TRANSDERMAL | Status: DC
  Administered 2016-03-28: 1 [in_us] via TOPICAL
  Filled 2016-03-28: qty 30

## 2016-03-28 MED ORDER — GUAIFENESIN ER 600 MG PO TB12
600.0000 mg | ORAL_TABLET | Freq: Two times a day (BID) | ORAL | Status: DC
  Administered 2016-03-28: 600 mg via ORAL
  Filled 2016-03-28 (×3): qty 1

## 2016-03-28 MED ORDER — HYDRALAZINE HCL 20 MG/ML IJ SOLN
10.0000 mg | Freq: Once | INTRAMUSCULAR | Status: AC
  Administered 2016-03-28: 10 mg via INTRAVENOUS
  Filled 2016-03-28: qty 1

## 2016-03-28 MED ORDER — INSULIN LISPRO PROT & LISPRO (75-25 MIX) 100 UNIT/ML KWIKPEN: 20.0000 [IU] | PEN_INJECTOR | Freq: Two times a day (BID) | SUBCUTANEOUS | Status: DC

## 2016-03-28 MED ORDER — INSULIN ASPART PROT & ASPART (70-30 MIX) 100 UNIT/ML ~~LOC~~ SUSP
30.0000 [IU] | Freq: Every day | SUBCUTANEOUS | Status: DC
  Administered 2016-03-28 – 2016-03-29 (×2): 30 [IU] via SUBCUTANEOUS
  Filled 2016-03-28: qty 10

## 2016-03-28 MED ORDER — INSULIN ASPART 100 UNIT/ML ~~LOC~~ SOLN
0.0000 [IU] | Freq: Three times a day (TID) | SUBCUTANEOUS | Status: DC
  Administered 2016-03-28: 2 [IU] via SUBCUTANEOUS
  Administered 2016-03-29 – 2016-03-30 (×2): 1 [IU] via SUBCUTANEOUS

## 2016-03-28 MED ORDER — HEPARIN SODIUM (PORCINE) 1000 UNIT/ML DIALYSIS: 2000.0000 [IU] | INTRAMUSCULAR | Status: DC | PRN

## 2016-03-28 MED ORDER — LIDOCAINE-PRILOCAINE 2.5-2.5 % EX CREA: 1.0000 "application " | TOPICAL_CREAM | CUTANEOUS | Status: DC | PRN

## 2016-03-28 MED ORDER — METOPROLOL SUCCINATE ER 100 MG PO TB24
100.0000 mg | ORAL_TABLET | Freq: Every day | ORAL | Status: DC
  Administered 2016-03-28 – 2016-03-30 (×3): 100 mg via ORAL
  Filled 2016-03-28 (×3): qty 1

## 2016-03-28 MED ORDER — BENZONATATE 100 MG PO CAPS
200.0000 mg | ORAL_CAPSULE | Freq: Two times a day (BID) | ORAL | Status: DC | PRN
  Filled 2016-03-28: qty 2

## 2016-03-28 MED ORDER — ALTEPLASE 2 MG IJ SOLR: 2.0000 mg | Freq: Once | INTRAMUSCULAR | Status: DC | PRN

## 2016-03-28 MED ORDER — OXYCODONE-ACETAMINOPHEN 10-325 MG PO TABS: 1.0000 | ORAL_TABLET | Freq: Four times a day (QID) | ORAL | Status: DC | PRN

## 2016-03-28 MED ORDER — ONDANSETRON HCL 4 MG PO TABS
4.0000 mg | ORAL_TABLET | Freq: Four times a day (QID) | ORAL | Status: DC | PRN
  Administered 2016-03-29: 4 mg via ORAL
  Filled 2016-03-28: qty 1

## 2016-03-28 MED ORDER — ACETAMINOPHEN 325 MG PO TABS
650.0000 mg | ORAL_TABLET | Freq: Four times a day (QID) | ORAL | Status: DC | PRN
  Filled 2016-03-28 (×2): qty 2

## 2016-03-28 MED ORDER — CLONIDINE HCL 0.2 MG PO TABS
0.2000 mg | ORAL_TABLET | Freq: Two times a day (BID) | ORAL | Status: DC
  Administered 2016-03-28: 0.2 mg via ORAL
  Filled 2016-03-28 (×2): qty 1

## 2016-03-28 NOTE — Progress Notes (Signed)
Patient seen and examined by me.  She has not missed a dialysis session.  She is basically at her dry weight.  She reports recent pneumonia, but had clinically improved from that even though she still has cough intermittently productive of clear sputum.  Reports acute shortness of breath.  Chest xray shows pulmonary edema, which could be flash pulmonary edema in the setting of accelerated hypertension.  However, PE, ACS, and CHF would still be on the differential but she really doesn't look grossly volume overloaded.  She denies history of blood clots.  No recent prolonged road trips or plane rides.  Hemodynamically she is improved after interventions on the floor (IV lasix, nitropaste, neb) but she is requiring high flow oxygen to maintain O2 sats 94-95%.  ABG showed pure hypoxia.  Consider CTA chest if timing can be coordinated with nephrology and plans for HD.  Will hold on empiric anticoagulation for now.  These recommendations were discussed explicitly with the patient, by me, and she voiced an understanding.

## 2016-03-28 NOTE — Progress Notes (Signed)
[place] TEAM 1 - Stepdown/ICU TEAM  [name]  [id number] DOB: Mar 02, 1961 DOA: 03/27/2016 PCP: Ricke Hey, MD    Brief Narrative:  55 y.o. female visiting from [place] with history of ESRD on HD M/W/F, HTN, DM2, paroxysmal SVT, hypothyroidism, and anemia of chronic disease who presented with shortness of breath and cough.  Pt was hospitalized at [facility] 2 weeks prior for pneumonia, and completed antibiotics one week prior. She had her regularly scheduled dialysis 7/5. She woke up in the middle of the night wheezing and short of breath, and her sx ultimatley worsened over the next 24hrs.  Patient reports her dry weight is 95 kg.  In the ED the patient was afebrile, respirations 26, O2 sats 50% on room air, blood pressure 215/66. Patient was placed on a nonrebreather with improvement of O2 saturations to 100%. Lab work revealed WBC 6.5, hemoglobin 8.9, and troponin negative.  Subjective: Pt is seen for a f/u visit.    Assessment & Plan:  Idiopathic acute hypoxic respiratory failure ?insufficienct HD as outpt + malignant HTN in setting of diastolic CHF > flash pulmonary edema w/o total body volume overload   ESRD on M/W/F HD Left arm AV graft - pt reports her dry weight is 95kg, but during similar admit in Feb it was actually lowered to 0000000  Grade 2 diastolic congestive heart failure (TTE Feb 2017)  Recent pneumonia  Malignant hypertension  Paroxysmal SVT  Peripheral arterial disease  Anemia of chronic kidney disease  Sickle cell trait  DM 2  Hypothyroidism  Hyperlipidemia  GERD  DVT prophylaxis: SQ heparin Code Status: FULL CODE Family Communication: no family present at time of exam  Disposition Plan:   Consultants:  Nephrology  Procedures: none  Antimicrobials:  none   Objective: Blood pressure 159/79, pulse 69, temperature 98.5 F (36.9 C), temperature source Oral, resp. rate 15, SpO2 98 %.  Intake/Output  Summary (Last 24 hours) at 03/28/16 0925 Last data filed at 03/28/16 0900  Gross per 24 hour  Intake    240 ml  Output      0 ml  Net    240 ml   There were no vitals filed for this visit.  Examination: Pt was seen for a f/u visit.    CBC:  Recent Labs Lab 03/28/16 0020  WBC 6.5  NEUTROABS 4.2  HGB 8.9*  HCT 28.3*  MCV 75.1*  PLT [id number]*   Basic Metabolic Panel:  Recent Labs Lab 03/28/16 0020  NA 137  136  K 5.0  5.0  CL 102  102  CO2 24  23  GLUCOSE 197*  199*  BUN 54*  54*  CREATININE 8.54*  8.50*  CALCIUM 8.3*  8.3*   GFR: CrCl cannot be calculated (Unknown ideal weight.).  Liver Function Tests:  Recent Labs Lab 03/28/16 0020  AST 18  ALT 14  ALKPHOS 149*  BILITOT 0.6  PROT 7.0  ALBUMIN 3.5   No results for input(s): LIPASE, AMYLASE in the last 168 hours. No results for input(s): AMMONIA in the last 168 hours.  Coagulation Profile: No results for input(s): INR, PROTIME in the last 168 hours.  Cardiac Enzymes:  Recent Labs Lab 03/28/16 0020  TROPONINI <0.03    HbA1C: No results found for: HGBA1C  CBG:  Recent Labs Lab 03/28/16 0727  GLUCAP 176*    Recent Results (from the past 240 hour(s))  MRSA PCR Screening     Status: None  Collection Time: 03/28/16  6:15 AM  Result Value Ref Range Status   MRSA by PCR NEGATIVE NEGATIVE Final    Comment:        The GeneXpert MRSA Assay (FDA approved for NASAL specimens only), is one component of a comprehensive MRSA colonization surveillance program. It is not intended to diagnose MRSA infection nor to guide or monitor treatment for MRSA infections.      Scheduled Meds: . aspirin  81 mg Oral Daily  . cloNIDine  0.2 mg Oral BID  . guaiFENesin  600 mg Oral BID  . heparin  5,000 Units Subcutaneous Q8H  . insulin aspart  0-9 Units Subcutaneous TID WC  . insulin aspart protamine- aspart  20 Units Subcutaneous Q supper  . insulin aspart protamine- aspart  30 Units Subcutaneous  Q breakfast  . levothyroxine  125 mcg Oral QAC breakfast  . metoprolol succinate  100 mg Oral Q supper  . NIFEdipine  60 mg Oral BID  . nitroGLYCERIN  1 inch Topical Q6H  . pantoprazole  80 mg Oral Q1200  . pravastatin  40 mg Oral QHS  . pregabalin  75 mg Oral BID  . sevelamer carbonate  1,600 mg Oral TID WC    Time spent: No Charge  Cherene Altes, MD [facility] Office  [phone] Pager - Text Page per Amion as per below:  On-Call/Text Page:      [email or web address]      password [email or web address]  If 7PM-7AM, please contact night-coverage [email or web address] Password [email or web address] 03/28/2016, 9:25 AM

## 2016-03-28 NOTE — Progress Notes (Signed)
Pt had difficulty breathing since she arrived on the floor. Per MD advise, pt is transferred to [phone]

## 2016-03-28 NOTE — Progress Notes (Signed)
VASCULAR LAB PRELIMINARY  PRELIMINARY  PRELIMINARY  PRELIMINARY  Bilateral lower extremity venous duplex completed.    Preliminary report:  There is no obvious evidence of DVT or SVT noted in the visualized veins of the bilateral lower extremities.   Tasheka Houseman, RVT 03/28/2016, 5:21 PM

## 2016-03-28 NOTE — Consult Note (Addendum)
Renal Service Consult Note [facility]  [name] 03/28/2016 [name] Requesting Physician:  Karina [name]  Reason for Consult:  ESRD pt with SOB HPI: The patient is a 55 y.o. year-old with history of IDDM, HTN and ESRD on HD since 2011.  Patient admitted last night with SOB and pulm edema per CXR. Trop and EKG were negative.  No CP. Asked to see for HD.    Patient started HD 2011 in [place] at [facility].   Last year in Nov 2016 she moved to [place] and is getting dialysis there now on MWF schedule.  Has LUA graft as of Jul 2017, same as she started with.  ESRD due to DM/ HTN.    Patient says she doesn't gain a lot of fluid, stays on the machine, always tells them to "take all the fluid off".  Has been leaving under dry wt and BP's have been higher recently.  Not trying to lose weight.  No recent CP , no history of MI/ stent/ angina or heart problems.  She had an echo in 2013 which was normal. ECHO in Feb 2107 showed normal EF but did show G2DD and also "dynamic obstruction in the mid cavity, with a peak velocity of 134 cm/sec and a peak gradient of 7 mm Hg".    Patient has 2 grown children in [place] and she came here this weekend to visit them and her grandkids.     Chart review: 2014 Fevers, poss HSV1. Graball pending at DC. ESRD HD TTS. DM. 2016 R upper chest pain d/t recently placed HD cath. Supp care. ESRD/HTN/DM.  2017 Resp distress/ pulm edema/ vol overload. Resolved w HD. Got 5d abx for poss hcap.    ROS  denies CP  no joint pain   no HA  no blurry vision  no rash  no diarrhea  no nausea/ vomiting  no dysuria  no difficulty voiding  no change in urine color    Past Medical History  Past Medical History  Diagnosis Date  . Diabetes mellitus, type II, insulin dependent ([place])   . Paroxysmal supraventricular tachycardia ([place])   . End stage renal disease on dialysis ([place])     Hemodialysis T, TH, Sat  . Hypothyroidism (acquired)   . Essential  hypertension     Difficult control. On multiple medications.  . Diabetic peripheral neuropathy associated with type 2 diabetes mellitus ([place])   . Peripheral arterial disease [facility]) December 2014    @ [place]: a) RSFA PTA (01/2013); b) L SFA [place] Atherectomy/PTA with 3V runoff; LEA Dopplers January 2016 [facility] post L SFA PTA): Mild, insignificant disease in the left CFA, profunda, SFA, popliteal artery and tibioperoneal trunk.  . 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 Hemodialysis patient ([place])   . Hyperlipidemia   . Sickle cell trait ([place])   . Gastroparesis due to DM ([place])   . GERD (gastroesophageal reflux disease)   . Heart murmur   . Arthritis    Past Surgical History  Past Surgical History  Procedure Laterality Date  . Abdominal hysterectomy    . Knee surgery      right  . Thyroid surgery    . Thyroidectomy    . Knee arthroplasty    . Cataract extraction, bilateral    . Colonoscopy  07/19/2012    Procedure: COLONOSCOPY;  Surgeon: [name];  Location: [place];  Service: Endoscopy;  Laterality: N/A;  . Arteriovenous  graft placement Left 2012    Karina. [facility] Lopez  2nd graft  . Shuntogram Left 02/14/2013    Procedure: FISTULOGRAM;  Surgeon: [name];  Location: [facility] CATH LAB;  Service: Cardiovascular;  Laterality: Left;  . Nm myoview ltd  September 2009; 01/15/2015    a) @ [name] - Negative for inducible or reversible ischemia with pharmacologic stress. EF 63% ; b) [name]: EF 68%. No RWMA, negative S4 reversible coronary ischemia or infarction.  . Carotid ultrasound   May 2011    Normal ([place], [place])  . Transthoracic echocardiogram  October 2013; January 2015    a) [name]) 2013: normal LV size and function. Mild LVH. EF 55-60%. no RWMA, No valve disease; b) [place] 1/'15: EF 70-75%, mild MR. Normal diastolic dysfunction  . Peripheral vascular  catheterization  May 2014; December 2014    a) RSFA & PopA mod-severe disease -- R SFA PTA, Med Rx of L SFA; b) distal abdominal and iliac & CFA arteries patent.  m-d LSFA long 60%, LPop 60% -> 3 V runoff. RSFA ~40% mid, RPop 60%, 3V runoff  . Peripheral angioplasty  May 2014    PTA of R SFA-PopA.  [facility] Peripheral athrectomy  December 2014    [place] Atherectomy of L SFA-L PopA reduction of 60% to 10% stenosis   . Fistulogram Left 06/04/2015    Procedure: FISTULOGRAM;  Surgeon: [name];  Location: [facility] OR;  Service: Vascular;  Laterality: Left;  . Revision of arteriovenous goretex graft Left 06/04/2015    Procedure: REVISION OF ARTERIOVENOUS GORETEX GRAFT;  Surgeon: [name];  Location: [place];  Service: Vascular;  Laterality: Left;   Family History  Family History  Problem Relation Age of Onset  . Diabetes Mother   . Hypertension Mother   . Heart attack Mother   . Diabetes Father   . Hypertension Father    Social History  reports that she quit smoking about 22 years ago. Her smoking use included Cigarettes. She has never used smokeless tobacco. She reports that she uses illicit drugs (Marijuana). She reports that she does not drink alcohol. Allergies No Known Allergies Home medications Prior to Admission medications   Medication Sig Start Date End Date Taking? Authorizing Provider  aspirin 81 MG chewable tablet Chew 81 mg by mouth daily.    Historical Provider, Lopez  cloNIDine (CATAPRES) 0.2 MG tablet Take 0.2 mg by mouth 2 (two) times daily.     Historical Provider, Lopez  esomeprazole (NEXIUM) 40 MG capsule Take 40 mg by mouth daily before breakfast.    Historical Provider, Lopez  Insulin Lispro Prot & Lispro (HUMALOG MIX 75/25 KWIKPEN) (75-25) 100 UNIT/ML Kwikpen Inject 20-30 Units into the skin 2 (two) times daily with a meal. Inject 30 units subcutaneously with breakfast and 20 units with supper    Historical Provider, Lopez  levothyroxine (SYNTHROID, LEVOTHROID) 125 MCG tablet  Take 125 mcg by mouth daily before breakfast.     Historical Provider, Lopez  metoprolol succinate (TOPROL-XL) 100 MG 24 hr tablet Take 100 mg by mouth daily with supper. Take with or immediately following a meal.    Historical Provider, Lopez  NIFEdipine (PROCARDIA-XL/ADALAT CC) 60 MG 24 hr tablet Take 60 mg by mouth 2 (two) times daily. 10/16/15   Historical Provider, Lopez  oxyCODONE-acetaminophen (PERCOCET) 10-325 MG per tablet Take 1 tablet by mouth every 6 (six) hours as needed for pain. 06/04/15   Aldona Bar  J Rhyne, PA-C  pravastatin (PRAVACHOL) 40 MG tablet Take 40 mg by mouth at bedtime.     Historical Provider, Lopez  pregabalin (LYRICA) 75 MG capsule Take 75 mg by mouth 2 (two) times daily.    Historical Provider, Lopez  promethazine (PHENERGAN) 25 MG tablet Take 25 mg by mouth every 8 (eight) hours as needed for nausea or vomiting.     Historical Provider, Lopez  sevelamer carbonate (RENVELA) 800 MG tablet Take 800-1,600 mg by mouth 3 (three) times daily with meals. Take 2 tablets (1600 mg) by mouth with meals, and 1 tablet (800 mg) with snacks    Historical Provider, Lopez   Liver Function Tests  Recent Labs Lab 03/28/16 0020  AST 18  ALT 14  ALKPHOS 149*  BILITOT 0.6  PROT 7.0  ALBUMIN 3.5   No results for input(s): LIPASE, AMYLASE in the last 168 hours. CBC  Recent Labs Lab 03/28/16 0020  WBC 6.5  NEUTROABS 4.2  HGB 8.9*  HCT 28.3*  MCV 75.1*  PLT [id number]*   Basic Metabolic Panel  Recent Labs Lab 03/28/16 0020  NA 137  136  K 5.0  5.0  CL 102  102  CO2 24  23  GLUCOSE 197*  199*  BUN 54*  54*  CREATININE 8.54*  8.50*  CALCIUM 8.3*  8.3*   Iron/TIBC/Ferritin/ %Sat    Component Value Date/Time   IRON 55 03/09/2009 1555   TIBC 262 03/09/2009 1555   FERRITIN 185 03/09/2009 1555   IRONPCTSAT 21 03/09/2009 1555    Filed Vitals:   03/28/16 0457 03/28/16 0610 03/28/16 0700 03/28/16 0724  BP: 157/123 167/74 173/79   Pulse:  92 80   Temp:    98.5 F (36.9 C)  TempSrc:     Oral  Resp:  15 21   SpO2:  90% 100%    Exam Gen obese pleasant AAF, on O2, +cough and dyspneic mildly No rash, cyanosis or gangrene Sclera anicteric, throat clear  No jvd or bruits Chest rales bilat bases RRR no MRG Abd soft ntnd no mass or ascites +bs GU defer MS no joint effusions or deformity Ext 1+ pitting bilat LE edema / no wounds or ulcers Neuro is alert, Ox 3 , nf LUA AVG +bruit  Home meds: Clon 0.2 bid, ToprolXL 100 qhs, proc XL 60 bid ASA, nexium, 75/25 humalog bid, statin, lyrica, synthroid, percocet Renvela    Dialysis: [facility] in [place] on MWF schedule  Assessment: 1.  Pulm edema - mild vol overload on exam, sig pulm edema but only 1-2 kg over dry wt. Hx of good compliance w fluids and HD.  Could have lean body wt loss, vs other cardiavasc cause of pulm edema (ischemia, HTN crisis; also LV outflow obst noted by echo on last admission here).  F/U ECHO ordered. Plan HD this am w max UF as tolerated.  Would keep here.  May need cards input depending on response to HD and ECHO results.   2.  ESRD MWF HD in [place] 3.  DM2 insulin dep 4.  HTN on BP meds x 3 at home   Plan - HD today, HD again tomorrow, get vol down further.   Kelly Splinter Lopez [employer] pager [phone]    cell [phone] 03/28/2016, 8:55 AM

## 2016-03-28 NOTE — Progress Notes (Signed)
Pt is having nausea, and vomiting, IV Zofran administered with little relief, patient is having difficulty breathing, MD notified new order received.

## 2016-03-28 NOTE — H&P (Signed)
History and Physical    [name] [id number] DOB: 10-15-1960 DOA: 03/27/2016  Referring MD/NP/PA: Dr. Roxanne Mins PCP: Ricke Hey, MD  Patient coming from: Visiting family's home  Chief Complaint: Shortness of breath  HPI: [name] is a 55 y.o. female with medical history significant of ESRD on HD M/W/F, HTN, DM2, paroxysmal SVT, hypothyroidism, anemia of chronic disease; who presents with complaints of shortness of breath and cough. Patient states that she is here visiting family this weekend from [place], [place]. Just 2 weeks ago hospitalized at [facility] for pneumonia which she completed all antibiotics one week ago. She does not use home oxygen. Reports having her regularly scheduled dialysis on 2 days ago (7/5). Friday night she states she woke up in the middle of the night wheezing and short of breath. Those symptoms self resolved and she was able to go back to sleep. The following morning she felt like her normal self. She went out with the family and did a lot of walking and states that she did not drink a lot of water compared to how much she was sweating. Associated symptoms included leg swelling which she relates to all the walking. She also notes that she may have eaten something that she shouldn't have. Later Saturday while taking a bath she slipped in the bathtub and hit her face and busted her lip. She is able to get back up but since that time started to have more shortness of breath and coughing bringing up mucus. She states that she cannot understand why she keeps having pneumonia. Patient reports her dry weight as 95 kg.  ED Course: Upon admission to the emergency department patient was evaluated and seen to be afebrile, respirations up to 26, O2 saturations as low as 50% on room air, blood pressure as high as 215/66. Patient was placed on a nonrebreather mass with improvement of O2 saturations to 100%. Lab work revealed WBC 6.5, hemoglobin 8.9,  platelets 1:15, sodium 136, potassium 5, chloride 102, CO2 23, BUN 54, creatinine 8.5, calcium 8.3, glucose 199, and troponin negative.  Review of Systems: As per HPI otherwise 10 point review of systems negative.   Past Medical History  Diagnosis Date  . Diabetes mellitus, type II, insulin dependent ([place])   . Paroxysmal supraventricular tachycardia ([place])   . End stage renal disease on dialysis ([place])     Hemodialysis T, TH, Sat  . Hypothyroidism (acquired)   . Essential hypertension     Difficult control. On multiple medications.  . Diabetic peripheral neuropathy associated with type 2 diabetes mellitus ([place])   . Peripheral arterial disease [facility]) December 2014    @ [place]: a) RSFA PTA (01/2013); b) L SFA [place] Atherectomy/PTA with 3V runoff; LEA Dopplers January 2016 [facility] post L SFA PTA): Mild, insignificant disease in the left CFA, profunda, SFA, popliteal artery and tibioperoneal trunk.  . 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 Hemodialysis patient ([place])   . Hyperlipidemia   . Sickle cell trait ([place])   . Gastroparesis due to DM ([place])   . GERD (gastroesophageal reflux disease)   . Heart murmur   . Arthritis     Past Surgical History  Procedure Laterality Date  . Abdominal hysterectomy    . Knee surgery      right  . Thyroid surgery    . Thyroidectomy    . Knee arthroplasty    . Cataract extraction, bilateral    .  Colonoscopy  07/19/2012    Procedure: COLONOSCOPY;  Surgeon: Beryle Beams, MD;  Location: [facility] ENDOSCOPY;  Service: Endoscopy;  Laterality: N/A;  . Arteriovenous graft placement Left 2012    Dr. Harrington Challenger  2nd graft  . Shuntogram Left 02/14/2013    Procedure: FISTULOGRAM;  Surgeon: Serafina Mitchell, MD;  Location: [facility] CATH LAB;  Service: Cardiovascular;  Laterality: Left;  . Nm myoview ltd  September 2009; 01/15/2015    a) @ [facility] - Negative for inducible or reversible ischemia with pharmacologic  stress. EF 63% ; b) [facility]: EF 68%. No RWMA, negative S4 reversible coronary ischemia or infarction.  . Carotid ultrasound   May 2011    Normal ([place], [place])  . Transthoracic echocardiogram  October 2013; January 2015    a) [name]) 2013: normal LV size and function. Mild LVH. EF 55-60%. no RWMA, No valve disease; b) [facility] 1/'15: EF 70-75%, mild MR. Normal diastolic dysfunction  . Peripheral vascular catheterization  May 2014; December 2014    a) RSFA & PopA mod-severe disease -- R SFA PTA, Med Rx of L SFA; b) distal abdominal and iliac & CFA arteries patent.  m-d LSFA long 60%, LPop 60% -> 3 V runoff. RSFA ~40% mid, RPop 60%, 3V runoff  . Peripheral angioplasty  May 2014    PTA of R SFA-PopA.  Marland [facility] Peripheral athrectomy  December 2014    [place] Atherectomy of L SFA-L PopA reduction of 60% to 10% stenosis   . Fistulogram Left 06/04/2015    Procedure: FISTULOGRAM;  Surgeon: Conrad [place], MD;  Location: [place];  Service: Vascular;  Laterality: Left;  . Revision of arteriovenous goretex graft Left 06/04/2015    Procedure: REVISION OF ARTERIOVENOUS GORETEX GRAFT;  Surgeon: Conrad [place], MD;  Location: [place];  Service: Vascular;  Laterality: Left;     reports that she quit smoking about 22 years ago. Her smoking use included Cigarettes. She has never used smokeless tobacco. She reports that she uses illicit drugs (Marijuana). She reports that she does not drink alcohol.  No Known Allergies  Family History  Problem Relation Age of Onset  . Diabetes Mother   . Hypertension Mother   . Heart attack Mother   . Diabetes Father   . Hypertension Father     Prior to Admission medications   Medication Sig Start Date End Date Taking? Authorizing Provider  aspirin 81 MG chewable tablet Chew 81 mg by mouth daily.    Historical Provider, MD  cloNIDine (CATAPRES) 0.2 MG tablet Take 0.2 mg by mouth 2 (two) times daily.     Historical Provider, MD    esomeprazole (NEXIUM) 40 MG capsule Take 40 mg by mouth daily before breakfast.    Historical Provider, MD  Insulin Lispro Prot & Lispro (HUMALOG MIX 75/25 KWIKPEN) (75-25) 100 UNIT/ML Kwikpen Inject 20-30 Units into the skin 2 (two) times daily with a meal. Inject 30 units subcutaneously with breakfast and 20 units with supper    Historical Provider, MD  levothyroxine (SYNTHROID, LEVOTHROID) 125 MCG tablet Take 125 mcg by mouth daily before breakfast.     Historical Provider, MD  metoprolol succinate (TOPROL-XL) 100 MG 24 hr tablet Take 100 mg by mouth daily with supper. Take with or immediately following a meal.    Historical Provider, MD  NIFEdipine (PROCARDIA-XL/ADALAT CC) 60 MG 24 hr tablet Take 60 mg by mouth 2 (two) times daily. 10/16/15   Historical Provider, MD  oxyCODONE-acetaminophen (PERCOCET) 10-325 MG per tablet Take 1 tablet by mouth every 6 (six) hours as needed for pain. 06/04/15   Samantha J Rhyne, PA-C  pravastatin (PRAVACHOL) 40 MG tablet Take 40 mg by mouth at bedtime.     Historical Provider, MD  pregabalin (LYRICA) 75 MG capsule Take 75 mg by mouth 2 (two) times daily.    Historical Provider, MD  promethazine (PHENERGAN) 25 MG tablet Take 25 mg by mouth every 8 (eight) hours as needed for nausea or vomiting.     Historical Provider, MD  sevelamer carbonate (RENVELA) 800 MG tablet Take 800-1,600 mg by mouth 3 (three) times daily with meals. Take 2 tablets (1600 mg) by mouth with meals, and 1 tablet (800 mg) with snacks    Historical Provider, MD    Physical Exam:    Constitutional:  Obese in moderate distress  Filed Vitals:   03/28/16 0000 03/28/16 0026 03/28/16 0100 03/28/16 0130  BP:  203/94 189/73 170/75  Pulse:      Temp:      TempSrc:      Resp:  12 13 13   SpO2: 100%      Eyes: PERRL, lids and conjunctivae normal ENMT: Mucous membranes are moist. Posterior pharynx clear of any exudate or lesions.Normal dentition.  Neck: normal, supple, no masses, no  thyromegaly Respiratory: Tachypneic positive crackles. Patient unable to speak in short sentences.  Cardiovascular: Tachycardic, no murmurs / rubs / gallops. +1 pitting edema of the bilateral lower extremities. 2+ pedal pulses. No carotid bruits.  Abdomen: no tenderness, no masses palpated. No hepatosplenomegaly. Bowel sounds positive.  Musculoskeletal: no clubbing / cyanosis. No joint deformity upper and lower extremities. Good ROM, no contractures. Normal muscle tone.  Skin: no rashes, lesions, ulcers. No induration Neurologic: CN 2-12 grossly intact. Strength 5/5 in all 4.  Psychiatric: Normal judgment and insight. Alert and oriented x 3. Normal mood.     Labs on Admission: I have personally reviewed following labs and imaging studies  CBC:  Recent Labs Lab 03/28/16 0020  WBC 6.5  NEUTROABS 4.2  HGB 8.9*  HCT 28.3*  MCV 75.1*  PLT [id number]*   Basic Metabolic Panel:  Recent Labs Lab 03/28/16 0020  NA 136  K 5.0  CL 102  CO2 23  GLUCOSE 199*  BUN 54*  CREATININE 8.50*  CALCIUM 8.3*   GFR: CrCl cannot be calculated (Unknown ideal weight.). Liver Function Tests: No results for input(s): AST, ALT, ALKPHOS, BILITOT, PROT, ALBUMIN in the last 168 hours. No results for input(s): LIPASE, AMYLASE in the last 168 hours. No results for input(s): AMMONIA in the last 168 hours. Coagulation Profile: No results for input(s): INR, PROTIME in the last 168 hours. Cardiac Enzymes:  Recent Labs Lab 03/28/16 0020  TROPONINI <0.03   BNP (last 3 results) No results for input(s): PROBNP in the last 8760 hours. HbA1C: No results for input(s): HGBA1C in the last 72 hours. CBG: No results for input(s): GLUCAP in the last 168 hours. Lipid Profile: No results for input(s): CHOL, HDL, LDLCALC, TRIG, CHOLHDL, LDLDIRECT in the last 72 hours. Thyroid Function Tests: No results for input(s): TSH, T4TOTAL, FREET4, T3FREE, THYROIDAB in the last 72 hours. Anemia Panel: No results for  input(s): VITAMINB12, FOLATE, FERRITIN, TIBC, IRON, RETICCTPCT in the last 72 hours. Urine analysis:    Component Value Date/Time   COLORURINE YELLOW 06/04/2013 1924   APPEARANCEUR CLEAR 06/04/2013 1924   LABSPEC 1.015 06/04/2013 1924   PHURINE 8.0 06/04/2013 1924  GLUCOSEU NEGATIVE 06/04/2013 1924   HGBUR NEGATIVE 06/04/2013 1924   BILIRUBINUR NEGATIVE 06/04/2013 1924   KETONESUR 15* 06/04/2013 1924   PROTEINUR 100* 06/04/2013 1924   UROBILINOGEN 0.2 06/04/2013 1924   NITRITE NEGATIVE 06/04/2013 1924   LEUKOCYTESUR NEGATIVE 06/04/2013 1924   Sepsis Labs: No results found for this or any previous visit (from the past 240 hour(s)).   Radiological Exams on Admission: Dg Chest Port 1 View  03/27/2016  CLINICAL DATA:  Dyspnea. EXAM: PORTABLE CHEST 1 VIEW COMPARISON:  Most recent chest radiograph 11/08/2015, additional prior exams reviewed FINDINGS: Cardiomegaly with stable mediastinal contours. Bilateral perihilar and infrahilar opacities, right greater than left. No large pleural effusion. No pneumothorax. Unchanged osseous structures. IMPRESSION: Bilateral perihilar and infrahilar opacities, right greater than left, may be pulmonary edema or pneumonia. Electronically Signed   By: Jeb Levering M.D.   On: 03/27/2016 23:16    EKG: Independently reviewed.   Assessment/Plan Pulmonary edema/ Fluid overload/ ESRD on HD: Acute on chronic. Question patient's compliance with dietary and fluid restrictions.  - Nephrology consulted and patient scheduled dialysis in a.m. - renal diet with fluid restriction  - continue Renvela  Acute Respiratory failure with hypoxia: Acute. Patient is not on his K oxygen at home O2 saturations on admission seemed as low as 50% on room air. - Continuous pulse oximetry with nasal cannula oxygen to keep O2 sats greater than 92% - DuoNeb's as needed for shortness of breath and wheezing  Recent community-acquired pneumonia - Mucinex  Fall with lip laceration:  Patient reports slipping in the shower - Continue to monitor  Essential hypertension, uncontrolled: Blood pressures as high as 216/66 while in the emergency department. - Continue metoprolol, clonidine, and nifedipine - Hydralazine when necessary  Diabetes mellitus type 2 - Hypoglycemic protocols - Continue patient's home 70/30 NovoLog regimen - CBGs every before meals and at bedtime with sensitive sliding scale insulin  Hypothyroidism - Continue levothyroxine   Hyperlipidemia  - Continue pravastatin  GERD - Pharmacy substitution of Protonix for Nexium   DVT prophylaxis: Heparin  Code Status:  Full Family Communication: No family present at bedside  Disposition Plan: Possible discharge home in 2- 3 days Consults called: Nephrology Admission status: Observation telemetry  Norval Morton MD [facility] Pager [phone]  If 7PM-7AM, please contact night-coverage [email or web address] Password [email or web address]  03/28/2016, 2:11 AM

## 2016-03-28 NOTE — Procedures (Signed)
  I was present at this dialysis session, have reviewed the session itself and made  appropriate changes Kelly Splinter MD [place] pager [phone]    cell [phone] 03/28/2016, 2:04 PM

## 2016-03-29 ENCOUNTER — Inpatient Hospital Stay (HOSPITAL_COMMUNITY): Payer: Medicare Other

## 2016-03-29 LAB — RENAL FUNCTION PANEL
ALBUMIN: 3.3 g/dL — AB (ref 3.5–5.0)
Anion gap: 10 (ref 5–15)
BUN: 30 mg/dL — AB (ref 6–20)
CALCIUM: 9 mg/dL (ref 8.9–10.3)
CO2: 26 mmol/L (ref 22–32)
CREATININE: 7.06 mg/dL — AB (ref 0.44–1.00)
Chloride: 100 mmol/L — ABNORMAL LOW (ref 101–111)
GFR calc Af Amer: 7 mL/min — ABNORMAL LOW (ref >60)
GFR, EST NON AFRICAN AMERICAN: 6 mL/min — AB (ref >60)
Glucose, Bld: 81 mg/dL (ref 65–99)
PHOSPHORUS: 3.8 mg/dL (ref 2.5–4.6)
POTASSIUM: 4.5 mmol/L (ref 3.5–5.1)
Sodium: 136 mmol/L (ref 135–145)

## 2016-03-29 LAB — CBC
HCT: 29.8 % — ABNORMAL LOW (ref 36.0–46.0)
HEMOGLOBIN: 9.3 g/dL — AB (ref 12.0–15.0)
MCH: 23.1 pg — AB (ref 26.0–34.0)
MCHC: 31.2 g/dL (ref 30.0–36.0)
MCV: 74.1 fL — ABNORMAL LOW (ref 78.0–100.0)
Platelets: 128 10*3/uL — ABNORMAL LOW (ref 150–400)
RBC: 4.02 MIL/uL (ref 3.87–5.11)
RDW: 20.4 % — ABNORMAL HIGH (ref 11.5–15.5)
WBC: 5.4 10*3/uL (ref 4.0–10.5)

## 2016-03-29 LAB — HEPATITIS B SURFACE ANTIGEN: Hepatitis B Surface Ag: NEGATIVE

## 2016-03-29 LAB — GLUCOSE, CAPILLARY
GLUCOSE-CAPILLARY: 153 mg/dL — AB (ref 65–99)
GLUCOSE-CAPILLARY: 116 mg/dL — AB (ref 65–99)
GLUCOSE-CAPILLARY: 72 mg/dL (ref 65–99)
Glucose-Capillary: 144 mg/dL — ABNORMAL HIGH (ref 65–99)

## 2016-03-29 LAB — TROPONIN I: Troponin I: <0.03 ng/mL (ref <0.03)

## 2016-03-29 MED ORDER — INSULIN ASPART PROT & ASPART (70-30 MIX) 100 UNIT/ML ~~LOC~~ SUSP
26.0000 [IU] | Freq: Every day | SUBCUTANEOUS | Status: DC
  Filled 2016-03-29: qty 10

## 2016-03-29 MED ORDER — ALTEPLASE 2 MG IJ SOLR: 2.0000 mg | Freq: Once | INTRAMUSCULAR | Status: DC | PRN

## 2016-03-29 MED ORDER — LIDOCAINE HCL (PF) 1 % IJ SOLN: 5.0000 mL | INTRAMUSCULAR | Status: DC | PRN

## 2016-03-29 MED ORDER — WHITE PETROLATUM GEL
Status: AC
  Administered 2016-03-29: 23:00:00
  Filled 2016-03-29: qty 1

## 2016-03-29 MED ORDER — SODIUM CHLORIDE 0.9 % IV SOLN: 100.0000 mL | INTRAVENOUS | Status: DC | PRN

## 2016-03-29 MED ORDER — NIFEDIPINE ER OSMOTIC RELEASE 90 MG PO TB24
90.0000 mg | ORAL_TABLET | Freq: Every day | ORAL | Status: DC
  Administered 2016-03-29 – 2016-03-31 (×3): 90 mg via ORAL
  Filled 2016-03-29 (×4): qty 1

## 2016-03-29 MED ORDER — HEPARIN SODIUM (PORCINE) 1000 UNIT/ML DIALYSIS: 1000.0000 [IU] | INTRAMUSCULAR | Status: DC | PRN

## 2016-03-29 MED ORDER — PENTAFLUOROPROP-TETRAFLUOROETH EX AERO: 1.0000 "application " | INHALATION_SPRAY | CUTANEOUS | Status: DC | PRN

## 2016-03-29 MED ORDER — SENNOSIDES-DOCUSATE SODIUM 8.6-50 MG PO TABS
1.0000 | ORAL_TABLET | Freq: Two times a day (BID) | ORAL | Status: DC
  Administered 2016-03-29 – 2016-03-31 (×5): 1 via ORAL
  Filled 2016-03-29 (×5): qty 1

## 2016-03-29 MED ORDER — LIDOCAINE-PRILOCAINE 2.5-2.5 % EX CREA: 1.0000 "application " | TOPICAL_CREAM | CUTANEOUS | Status: DC | PRN

## 2016-03-29 MED ORDER — WHITE PETROLATUM GEL
Status: AC
  Administered 2016-03-29: 09:00:00
  Filled 2016-03-29: qty 1

## 2016-03-29 MED ORDER — HEPARIN SODIUM (PORCINE) 1000 UNIT/ML DIALYSIS: 3500.0000 [IU] | INTRAMUSCULAR | Status: DC | PRN

## 2016-03-29 MED ORDER — NIFEDIPINE ER OSMOTIC RELEASE 90 MG PO TB24: 90.0000 mg | ORAL_TABLET | Freq: Two times a day (BID) | ORAL | Status: DC

## 2016-03-29 MED ORDER — INSULIN ASPART PROT & ASPART (70-30 MIX) 100 UNIT/ML ~~LOC~~ SUSP
16.0000 [IU] | Freq: Every day | SUBCUTANEOUS | Status: DC
  Filled 2016-03-29: qty 10

## 2016-03-29 NOTE — Progress Notes (Signed)
Admit: 03/27/2016 LOS: 1  66F ESRD [place] MWF admit with SOB/Hypoxia, suspected volume overload  Subjective:  HD yesterday, 3.2L UF BP down to 140s-170s SBP. Post weight 93.7kg Refusing HD this AM mainly 2/2 frustrataion with recent issus Discussed role of vol o/l for BP control, pt willing to rec HD today No other issues overnight, still on O2  07/09 0701 - 07/10 0700 In: 500 [P.O.:500] Out: 3200   Filed Weights   03/28/16 1120 03/28/16 1520 03/29/16 0500  Weight: 97.7 kg (215 lb 6.2 oz) 93.7 kg (206 lb 9.1 oz) 92.1 kg (203 lb 0.7 oz)    Scheduled Meds: . aspirin  81 mg Oral Daily  . cloNIDine  0.2 mg Oral TID  . heparin  5,000 Units Subcutaneous Q8H  . insulin aspart  0-9 Units Subcutaneous TID WC  . insulin aspart protamine- aspart  20 Units Subcutaneous Q supper  . insulin aspart protamine- aspart  30 Units Subcutaneous Q breakfast  . levothyroxine  125 mcg Oral QAC breakfast  . metoprolol succinate  100 mg Oral Q supper  . NIFEdipine  60 mg Oral BID  . pantoprazole  80 mg Oral Q1200  . pravastatin  40 mg Oral QHS  . pregabalin  75 mg Oral BID  . sevelamer carbonate  1,600 mg Oral TID WC   Continuous Infusions:  PRN Meds:.acetaminophen **OR** acetaminophen, benzonatate, guaiFENesin, heparin, hydrALAZINE, levalbuterol, ondansetron **OR** ondansetron (ZOFRAN) IV, oxyCODONE, promethazine, sevelamer carbonate  Current Labs: reviewed    Physical Exam:  Blood pressure 147/59, pulse 59, temperature 98.5 F (36.9 C), temperature source Oral, resp. rate 18, weight 92.1 kg (203 lb 0.7 oz), SpO2 100 %. RRR CTAB Nl WOB NAD No LEE S/nt/nd +B/T of LUE AVG  A 1. ESRD MWF [place] 2. Vol O/L, Pulm Edema, grade 2 DD 3. HTN on clonidine, nifedipine, MTP 4. DM2 5. Anemia of CKD  P 1. HD again today, probing down EDW for BP control 2. Might need more BP meds   Pearson Grippe MD 03/29/2016, 8:05 AM   Recent Labs Lab 03/28/16 0020  NA 137  136  K 5.0   5.0  CL 102  102  CO2 24  23  GLUCOSE 197*  199*  BUN 54*  54*  CREATININE 8.54*  8.50*  CALCIUM 8.3*  8.3*    Recent Labs Lab 03/28/16 0020 03/29/16 0702  WBC 6.5 5.4  NEUTROABS 4.2  --   HGB 8.9* 9.3*  HCT 28.3* 29.8*  MCV 75.1* 74.1*  PLT 115* 128*

## 2016-03-29 NOTE — Progress Notes (Signed)
[place] TEAM 1 - Stepdown/ICU TEAM  RUFUS MORRISETTE  [id number] DOB: 02/13/1961 DOA: 03/27/2016 PCP: Ricke Hey, MD    Brief Narrative:  55 y.o. female visiting from [place], [place] with history of ESRD on HD M/W/F, HTN, DM2, paroxysmal SVT, hypothyroidism, and anemia of chronic disease who presented with shortness of breath and cough.  Pt was hospitalized at [facility] 2 weeks prior for pneumonia, and completed antibiotics one week prior. She had her regularly scheduled dialysis 7/5. She woke up in the middle of the night wheezing and short of breath, and her sx ultimatley worsened over the next 24hrs.  Patient reports her dry weight is 95 kg.  In the ED the patient was afebrile, respirations 26, O2 sats 50% on room air, blood pressure 215/66. Patient was placed on a nonrebreather with improvement of O2 saturations to 100%. Lab work revealed WBC 6.5, hemoglobin 8.9, and troponin negative.  Subjective: The patient states she's feeling much better in general.  She does complain of some cramping in her hands.  She was initially reluctant to have dialysis again today but now has agreed to it after speaking with the nephrologist.  She denies chest pain nausea vomiting or abdominal pain.  Her appetite is good.  She feels her shortness of breath has improved though it is not yet back to baseline.  Assessment & Plan:  Idiopathic acute hypoxic respiratory failure ?insufficienct HD as outpt + malignant HTN in setting of diastolic CHF > flash pulmonary edema w/o total body volume overload - much improved at this time - cont to wean O2 as able   Malignant hypertension Blood pressure not yet at goal - adjust treatment again today and follow - repeat HD should help   ESRD on M/W/F HD Left arm AV graft - pt reports her dry weight is 95kg, but during similar admit in Feb it was actually lowered to 91kg - to have HD again today, w/ official weight to be obtained in HD unit  prior to tx   Grade 2 diastolic congestive heart failure (TTE Feb 2017) Follow up TTE pending  Recent pneumonia Procalcitonin not convincing for acute infection - no clear indication for antibiotics at this time  Paroxysmal SVT Well-controlled at present  Peripheral arterial disease No acute complications  Anemia of chronic kidney disease  Hemoglobin stable  Sickle cell trait [place]  DM 2 CBG is currently well-controlled  Hypothyroidism Continue home Synthroid dose  Hyperlipidemia Continue home Pravachol dose  GERD Continue PPI I as per home  DVT prophylaxis: SQ heparin Code Status: FULL CODE Family Communication: no family present at time of exam  Disposition Plan: Stable for transfer to renal floor - continue to titrate blood pressure medications - follow-up dry weight - follow up TTE - ambulate  Consultants:  Nephrology  Procedures: none  Antimicrobials:  none   Objective: Blood pressure 157/63, pulse 50, temperature 97.8 F (36.6 C), temperature source Oral, resp. rate 18, weight 92.1 kg (203 lb 0.7 oz), SpO2 100 %.  Intake/Output Summary (Last 24 hours) at 03/29/16 0925 Last data filed at 03/28/16 2100  Gross per 24 hour  Intake    380 ml  Output   3200 ml  Net  -2820 ml   Filed Weights   03/28/16 1120 03/28/16 1520 03/29/16 0500  Weight: 97.7 kg (215 lb 6.2 oz) 93.7 kg (206 lb 9.1 oz) 92.1 kg (203 lb 0.7 oz)    Examination: General: No acute respiratory distress  at rest in chair  Lungs: fine crackles th/o - no wheeze  Cardiovascular: Regular rate and rhythm without murmur gallop or rub normal S1 and S2 Abdomen: Nontender, nondistended, soft, bowel sounds positive, no rebound, no ascites, no appreciable mass Extremities: No significant cyanosis, or clubbing - trace edema bilateral lower extremities   CBC:  Recent Labs Lab 03/28/16 0020 03/29/16 0702  WBC 6.5 5.4  NEUTROABS 4.2  --   HGB 8.9* 9.3*  HCT 28.3* 29.8*  MCV 75.1* 74.1*   PLT 115* 0000000*   Basic Metabolic Panel:  Recent Labs Lab 03/28/16 0020  NA 137  136  K 5.0  5.0  CL 102  102  CO2 24  23  GLUCOSE 197*  199*  BUN 54*  54*  CREATININE 8.54*  8.50*  CALCIUM 8.3*  8.3*   GFR: Estimated Creatinine Clearance: 7.9 mL/min (by C-G formula based on Cr of 8.5).  Liver Function Tests:  Recent Labs Lab 03/28/16 0020  AST 18  ALT 14  ALKPHOS 149*  BILITOT 0.6  PROT 7.0  ALBUMIN 3.5    Cardiac Enzymes:  Recent Labs Lab 03/28/16 0020 03/28/16 1138 03/29/16 0307  TROPONINI <0.03 <0.03 <0.03   CBG:  Recent Labs Lab 03/28/16 1251 03/28/16 1311 03/28/16 1313 03/28/16 1608 03/28/16 2126  GLUCAP 50* 57* 72 103* 138*    Recent Results (from the past 240 hour(s))  MRSA PCR Screening     Status: None   Collection Time: 03/28/16  6:15 AM  Result Value Ref Range Status   MRSA by PCR NEGATIVE NEGATIVE Final    Comment:        The GeneXpert MRSA Assay (FDA approved for NASAL specimens only), is one component of a comprehensive MRSA colonization surveillance program. It is not intended to diagnose MRSA infection nor to guide or monitor treatment for MRSA infections.      Scheduled Meds: . aspirin  81 mg Oral Daily  . cloNIDine  0.2 mg Oral TID  . heparin  5,000 Units Subcutaneous Q8H  . insulin aspart  0-9 Units Subcutaneous TID WC  . insulin aspart protamine- aspart  20 Units Subcutaneous Q supper  . insulin aspart protamine- aspart  30 Units Subcutaneous Q breakfast  . levothyroxine  125 mcg Oral QAC breakfast  . metoprolol succinate  100 mg Oral Q supper  . NIFEdipine  60 mg Oral BID  . pantoprazole  80 mg Oral Q1200  . pravastatin  40 mg Oral QHS  . pregabalin  75 mg Oral BID  . sevelamer carbonate  1,600 mg Oral TID WC  . white petrolatum        Cherene Altes, MD [facility] Office  [phone] Pager - Text Page per Amion as per below:  On-Call/Text Page:      [email or web address]      password  [email or web address]  If 7PM-7AM, please contact night-coverage [email or web address] Password [email or web address] 03/29/2016, 9:25 AM

## 2016-03-29 NOTE — Care Management Important Message (Signed)
Important Message  Patient Details  Name: [name] MRN: [id number] Date of Birth: 11/17/1960   Medicare Important Message Given:  Yes    Loann Quill 03/29/2016, 8:14 AM

## 2016-03-29 NOTE — Progress Notes (Signed)
Called to get report prior to hemodialysis, per primary nurse. Patient refusing dialysis with morning.CN notified

## 2016-03-29 NOTE — Progress Notes (Signed)
Patient off treatment an hour early due to cramping. Md aware.

## 2016-03-29 NOTE — Procedures (Signed)
I was present at this dialysis session. I have reviewed the session itself and made appropriate changes.   4L goal. Toerating well.    Filed Weights   03/28/16 1520 03/29/16 0500 03/29/16 1150  Weight: 93.7 kg (206 lb 9.1 oz) 92.1 kg (203 lb 0.7 oz) 94.5 kg (208 lb 5.4 oz)     Recent Labs Lab 03/28/16 0020  NA 137  136  K 5.0  5.0  CL 102  102  CO2 24  23  GLUCOSE 197*  199*  BUN 54*  54*  CREATININE 8.54*  8.50*  CALCIUM 8.3*  8.3*     Recent Labs Lab 03/28/16 0020 03/29/16 0702  WBC 6.5 5.4  NEUTROABS 4.2  --   HGB 8.9* 9.3*  HCT 28.3* 29.8*  MCV 75.1* 74.1*  PLT 115* 128*    Scheduled Meds: . aspirin  81 mg Oral Daily  . cloNIDine  0.2 mg Oral TID  . heparin  5,000 Units Subcutaneous Q8H  . insulin aspart  0-9 Units Subcutaneous TID WC  . insulin aspart protamine- aspart  16 Units Subcutaneous Q supper  . [START ON 03/30/2016] insulin aspart protamine- aspart  26 Units Subcutaneous Q breakfast  . levothyroxine  125 mcg Oral QAC breakfast  . metoprolol succinate  100 mg Oral Q supper  . NIFEdipine  90 mg Oral Daily  . pantoprazole  80 mg Oral Q1200  . pravastatin  40 mg Oral QHS  . pregabalin  75 mg Oral BID  . senna-docusate  1 tablet Oral BID  . sevelamer carbonate  1,600 mg Oral TID WC   Continuous Infusions:  PRN Meds:.sodium chloride, sodium chloride, acetaminophen **OR** acetaminophen, alteplase, benzonatate, guaiFENesin, heparin, heparin, [START ON 03/30/2016] heparin, hydrALAZINE, levalbuterol, lidocaine (PF), lidocaine-prilocaine, ondansetron **OR** ondansetron (ZOFRAN) IV, oxyCODONE, pentafluoroprop-tetrafluoroeth, promethazine, sevelamer carbonate   Pearson Grippe  MD 03/29/2016, 12:42 PM

## 2016-03-29 NOTE — Progress Notes (Signed)
Pt transferred via wheelchair with RN and personal belongings including 1 cell phone. Report called to RN and pt tolerated transfer well. Pt transferred to 6E28 and placed in the chair with call bell in reach.

## 2016-03-30 ENCOUNTER — Inpatient Hospital Stay (HOSPITAL_COMMUNITY): Payer: Medicare Other

## 2016-03-30 DIAGNOSIS — Z992 Dependence on renal dialysis: Secondary | ICD-10-CM

## 2016-03-30 DIAGNOSIS — N186 End stage renal disease: Secondary | ICD-10-CM

## 2016-03-30 DIAGNOSIS — E1122 Type 2 diabetes mellitus with diabetic chronic kidney disease: Secondary | ICD-10-CM

## 2016-03-30 DIAGNOSIS — R0600 Dyspnea, unspecified: Secondary | ICD-10-CM

## 2016-03-30 DIAGNOSIS — J810 Acute pulmonary edema: Secondary | ICD-10-CM

## 2016-03-30 LAB — ECHOCARDIOGRAM COMPLETE
CHL CUP MV DEC (S): 250
E decel time: 250 msec
E/e' ratio: 13.68
FS: 33 % (ref 28–44)
IV/PV OW: 0.93
LADIAMINDEX: 1.97 cm/m2
LASIZE: 38 mm
LAVOL: 62.2 mL
LAVOLA4C: 59.5 mL
LAVOLIN: 32.2 mL/m2
LDCA: 2.84 cm2
LEFT ATRIUM END SYS DIAM: 38 mm
LV PW d: 11 mm — AB (ref 0.6–1.1)
LVEEAVG: 13.68
LVEEMED: 13.68
LVELAT: 8.7 cm/s
LVOT diameter: 19 mm
MV Peak grad: 6 mmHg
MVPKAVEL: 90.7 m/s
MVPKEVEL: 119 m/s
RV LATERAL S' VELOCITY: 11 cm/s
TAPSE: 19.4 mm
TDI e' lateral: 8.7
TDI e' medial: 7.18
Weight: 3273.39 oz

## 2016-03-30 LAB — GLUCOSE, CAPILLARY
GLUCOSE-CAPILLARY: 126 mg/dL — AB (ref 65–99)
GLUCOSE-CAPILLARY: 61 mg/dL — AB (ref 65–99)
GLUCOSE-CAPILLARY: 56 mg/dL — AB (ref 65–99)
GLUCOSE-CAPILLARY: 80 mg/dL (ref 65–99)
GLUCOSE-CAPILLARY: 65 mg/dL (ref 65–99)
Glucose-Capillary: 55 mg/dL — ABNORMAL LOW (ref 65–99)
Glucose-Capillary: 79 mg/dL (ref 65–99)
Glucose-Capillary: 54 mg/dL — ABNORMAL LOW (ref 65–99)
Glucose-Capillary: 88 mg/dL (ref 65–99)

## 2016-03-30 LAB — PROCALCITONIN: Procalcitonin: 0.72 ng/mL

## 2016-03-30 MED ORDER — INSULIN ASPART PROT & ASPART (70-30 MIX) 100 UNIT/ML ~~LOC~~ SUSP
26.0000 [IU] | Freq: Every day | SUBCUTANEOUS | Status: DC
  Administered 2016-03-30: 26 [IU] via SUBCUTANEOUS
  Filled 2016-03-30: qty 10

## 2016-03-30 MED ORDER — LEVOTHYROXINE SODIUM 25 MCG PO TABS
125.0000 ug | ORAL_TABLET | Freq: Every day | ORAL | Status: DC
  Administered 2016-03-30 – 2016-03-31 (×2): 125 ug via ORAL
  Filled 2016-03-30 (×2): qty 1

## 2016-03-30 NOTE — Progress Notes (Signed)
[name]  [id number] DOB: 28-Feb-1961 DOA: 03/27/2016 PCP: Ricke Hey, MD    Brief Narrative:  55 y.o. female visiting from [place], [place] with history of ESRD on HD M/W/F, HTN, DM2, paroxysmal SVT, hypothyroidism, and anemia of chronic disease who presented with shortness of breath and cough.  Pt was hospitalized at [facility] 2 weeks prior for pneumonia, and completed antibiotics one week prior. She had her regularly scheduled dialysis 7/5. She woke up in the middle of the night wheezing and short of breath, and her sx ultimatley worsened over the next 24hrs.  Patient reports her dry weight is 95 kg.  In the ED the patient was afebrile, respirations 26, O2 sats 50% on room air, blood pressure 215/66. Patient was placed on a nonrebreather with improvement of O2 saturations to 100%. Lab work revealed WBC 6.5, hemoglobin 8.9, and troponin negative.  Subjective: The patient states she's feeling much better in general.    Assessment & Plan:  Idiopathic acute hypoxic respiratory failure - ? insufficienct HD as outpt + malignant HTN in setting of diastolic CHF > flash pulmonary edema w/o total body volume overload - much improved at this time - cont to wean O2 as able   Malignant hypertension - SBP in 150's, improving  - repeat HD should help   ESRD on M/W/F HD - Left arm AV graft - pt reports her dry weight is 95kg, but during similar admit in Feb it was actually lowered to 91kg  - HD per nephrology team   Grade 2 diastolic congestive heart failure (TTE Feb 2017) - ECHO pending   Recent pneumonia - Procalcitonin not convincing for acute infection  - no clear indication for antibiotics at this time  Paroxysmal SVT - Well-controlled at present  Peripheral arterial disease - No acute complications  Anemia of chronic kidney disease  - Hemoglobin stable, no signs of bleeding  - CBC in AM  Sickle cell trait  DM 2 with complications of  nephropathy  - CBG is currently well controlled  Thrombocytopenia - mild, monitor   Hypothyroidism - Continue home Synthroid dose  Hyperlipidemia - Continue home Pravachol dose  GERD - Continue PPI I as per home  DVT prophylaxis: SQ heparin Code Status: FULL CODE Family Communication: no family present at time of exam  Disposition Plan: Home in 1-2 days   Consultants:  Nephrology  Procedures: None   Antimicrobials:  None    Objective: Blood pressure 151/55, pulse 61, temperature 98.2 F (36.8 C), temperature source Oral, resp. rate 16, weight 92.8 kg (204 lb 9.4 oz), SpO2 93 %.  Intake/Output Summary (Last 24 hours) at 03/30/16 1842 Last data filed at 03/30/16 0903  Gross per 24 hour  Intake    240 ml  Output      0 ml  Net    240 ml   Filed Weights   03/29/16 1150 03/29/16 1425 03/29/16 2033  Weight: 94.5 kg (208 lb 5.4 oz) 92.2 kg (203 lb 4.2 oz) 92.8 kg (204 lb 9.4 oz)    Examination: General: No acute respiratory distress at rest in chair  Lungs: fine crackles th/o - no wheeze  Cardiovascular: Regular rate and rhythm without murmur gallop or rub normal S1 and S2 Abdomen: Nontender, nondistended, soft, bowel sounds positive, no rebound, no ascites, no appreciable mass Extremities: No significant cyanosis, or clubbing - trace edema bilateral lower extremities   CBC:  Recent Labs Lab 03/28/16 0020 03/29/16 0702  WBC  6.5 5.4  NEUTROABS 4.2  --   HGB 8.9* 9.3*  HCT 28.3* 29.8*  MCV 75.1* 74.1*  PLT 115* 0000000*   Basic Metabolic Panel:  Recent Labs Lab 03/28/16 0020 03/29/16 1330  NA 137  136 136  K 5.0  5.0 4.5  CL 102  102 100*  CO2 24  23 26   GLUCOSE 197*  199* 81  BUN 54*  54* 30*  CREATININE 8.54*  8.50* 7.06*  CALCIUM 8.3*  8.3* 9.0  PHOS  --  3.8   Liver Function Tests:  Recent Labs Lab 03/28/16 0020 03/29/16 1330  AST 18  --   ALT 14  --   ALKPHOS 149*  --   BILITOT 0.6  --   PROT 7.0  --   ALBUMIN 3.5 3.3*     Cardiac Enzymes:  Recent Labs Lab 03/28/16 0020 03/28/16 1138 03/29/16 0307  TROPONINI <0.03 <0.03 <0.03   CBG:  Recent Labs Lab 03/30/16 1151 03/30/16 1241 03/30/16 1301 03/30/16 1337 03/30/16 1722  GLUCAP 61* 56* 55* 79 54*    Recent Results (from the past 240 hour(s))  MRSA PCR Screening     Status: None   Collection Time: 03/28/16  6:15 AM  Result Value Ref Range Status   MRSA by PCR NEGATIVE NEGATIVE Final    Comment:        The GeneXpert MRSA Assay (FDA approved for NASAL specimens only), is one component of a comprehensive MRSA colonization surveillance program. It is not intended to diagnose MRSA infection nor to guide or monitor treatment for MRSA infections.      Scheduled Meds: . aspirin  81 mg Oral Daily  . cloNIDine  0.2 mg Oral TID  . heparin  5,000 Units Subcutaneous Q8H  . insulin aspart  0-9 Units Subcutaneous TID WC  . insulin aspart protamine- aspart  16 Units Subcutaneous Q supper  . insulin aspart protamine- aspart  26 Units Subcutaneous Q breakfast  . levothyroxine  125 mcg Oral QAC breakfast  . metoprolol succinate  100 mg Oral Q supper  . NIFEdipine  90 mg Oral Daily  . pantoprazole  80 mg Oral Q1200  . pravastatin  40 mg Oral QHS  . pregabalin  75 mg Oral BID  . senna-docusate  1 tablet Oral BID  . sevelamer carbonate  1,600 mg Oral TID WC    Mart Piggs, MD [facility] Office  [phone] Pager [phone]  On-Call/Text Page:      [email or web address]      password [email or web address]  If 7PM-7AM, please contact night-coverage [email or web address] Password [facility] 03/30/2016, 6:42 PM

## 2016-03-30 NOTE — Progress Notes (Signed)
Hypoglycemic Event  CBG: 56  Treatment:  Carb snack  Symptoms: none  Follow-up CBG: Time: 13:01 CBG Result:55  Possible Reasons for Event: unknown  Comments/MD notified: yes    [name]

## 2016-03-30 NOTE — Progress Notes (Signed)
Hypoglycemic Event  CBG: 55  Treatment: carb snack  Symptoms: none  Follow-up CBG: Time: 13:37 CBG Result: 79  Possible Reasons for Event: unknown  Comments/MD notified: yes    [name]

## 2016-03-30 NOTE — Progress Notes (Signed)
Admit: 03/27/2016 LOS: 2  12F ESRD [place] MWF admit with SOB/Hypoxia, suspected volume overload  Subjective:  HD yesterday: post weight 92.2kg kg, 2.1L UF; tolerated ok but cramped limiting UF goal BP improved today On RA TTE pending Feels better  07/10 0701 - 07/11 0700 In: 290 [P.O.:290] Out: 2100   Filed Weights   03/29/16 1150 03/29/16 1425 03/29/16 2033  Weight: 94.5 kg (208 lb 5.4 oz) 92.2 kg (203 lb 4.2 oz) 92.8 kg (204 lb 9.4 oz)    Scheduled Meds: . aspirin  81 mg Oral Daily  . cloNIDine  0.2 mg Oral TID  . heparin  5,000 Units Subcutaneous Q8H  . insulin aspart  0-9 Units Subcutaneous TID WC  . insulin aspart protamine- aspart  16 Units Subcutaneous Q supper  . insulin aspart protamine- aspart  26 Units Subcutaneous Q breakfast  . levothyroxine  125 mcg Oral QAC breakfast  . metoprolol succinate  100 mg Oral Q supper  . NIFEdipine  90 mg Oral Daily  . pantoprazole  80 mg Oral Q1200  . pravastatin  40 mg Oral QHS  . pregabalin  75 mg Oral BID  . senna-docusate  1 tablet Oral BID  . sevelamer carbonate  1,600 mg Oral TID WC   Continuous Infusions:  PRN Meds:.acetaminophen **OR** acetaminophen, benzonatate, guaiFENesin, heparin, hydrALAZINE, levalbuterol, ondansetron **OR** ondansetron (ZOFRAN) IV, oxyCODONE, promethazine, sevelamer carbonate  Current Labs: reviewed    Physical Exam:  Blood pressure 147/56, pulse 65, temperature 98.2 F (36.8 C), temperature source Oral, resp. rate 16, weight 92.8 kg (204 lb 9.4 oz), SpO2 93 %. RRR CTAB Nl WOB NAD No LEE S/nt/nd +B/T of LUE AVG  A 1. ESRD MWF [place] 2. Vol O/L, Pulm Edema, grade 2 DD 3. HTN on clonidine, nifedipine, MTP; imprved control 4. DM2 5. Anemia of CKD  P 1. Next HD 7/12 if here, EDW 91.5kg 2. Counseled pt that accurate EDW and minimal IDWG will be critical for stable BP and resp status   Pearson Grippe MD 03/30/2016, 10:39 AM   Recent Labs Lab 03/28/16 0020  03/29/16 1330  NA 137  136 136  K 5.0  5.0 4.5  CL 102  102 100*  CO2 24  23 26   GLUCOSE 197*  199* 81  BUN 54*  54* 30*  CREATININE 8.54*  8.50* 7.06*  CALCIUM 8.3*  8.3* 9.0  PHOS  --  3.8    Recent Labs Lab 03/28/16 0020 03/29/16 0702  WBC 6.5 5.4  NEUTROABS 4.2  --   HGB 8.9* 9.3*  HCT 28.3* 29.8*  MCV 75.1* 74.1*  PLT 115* 128*

## 2016-03-30 NOTE — Progress Notes (Signed)
Hypoglycemic Event  CBG: 61  Treatment: carb snack  Symptoms: none  Follow-up CBG: Time: 12:41 CBG Result:56  Possible Reasons for Event: unknown  Comments/MD notified: yes    [name]

## 2016-03-30 NOTE — Progress Notes (Signed)
  Echocardiogram 2D Echocardiogram has been performed.  [name] 03/30/2016, 3:56 PM

## 2016-03-31 ENCOUNTER — Inpatient Hospital Stay (HOSPITAL_COMMUNITY): Payer: Medicare Other

## 2016-03-31 LAB — RENAL FUNCTION PANEL
ALBUMIN: 3.2 g/dL — AB (ref 3.5–5.0)
ANION GAP: 13 (ref 5–15)
BUN: 39 mg/dL — ABNORMAL HIGH (ref 6–20)
CO2: 25 mmol/L (ref 22–32)
CREATININE: 8.67 mg/dL — AB (ref 0.44–1.00)
Calcium: 8.8 mg/dL — ABNORMAL LOW (ref 8.9–10.3)
Chloride: 95 mmol/L — ABNORMAL LOW (ref 101–111)
GFR, EST AFRICAN AMERICAN: 5 mL/min — AB (ref >60)
GFR, EST NON AFRICAN AMERICAN: 5 mL/min — AB (ref >60)
Glucose, Bld: 101 mg/dL — ABNORMAL HIGH (ref 65–99)
Phosphorus: 4.6 mg/dL (ref 2.5–4.6)
Potassium: 4.3 mmol/L (ref 3.5–5.1)
SODIUM: 133 mmol/L — AB (ref 135–145)

## 2016-03-31 LAB — CBC
HEMATOCRIT: 28.7 % — AB (ref 36.0–46.0)
Hemoglobin: 8.8 g/dL — ABNORMAL LOW (ref 12.0–15.0)
MCH: 22.7 pg — AB (ref 26.0–34.0)
MCHC: 30.7 g/dL (ref 30.0–36.0)
MCV: 74.2 fL — AB (ref 78.0–100.0)
Platelets: 149 10*3/uL — ABNORMAL LOW (ref 150–400)
RBC: 3.87 MIL/uL (ref 3.87–5.11)
RDW: 19.9 % — AB (ref 11.5–15.5)
WBC: 5.6 10*3/uL (ref 4.0–10.5)

## 2016-03-31 LAB — GLUCOSE, CAPILLARY
Glucose-Capillary: 87 mg/dL (ref 65–99)
Glucose-Capillary: 121 mg/dL — ABNORMAL HIGH (ref 65–99)

## 2016-03-31 MED ORDER — OXYCODONE-ACETAMINOPHEN 10-325 MG PO TABS: 1.0000 | ORAL_TABLET | Freq: Four times a day (QID) | ORAL | Status: DC | PRN

## 2016-03-31 MED ORDER — HEPARIN SODIUM (PORCINE) 1000 UNIT/ML DIALYSIS: 20.0000 [IU]/kg | INTRAMUSCULAR | Status: DC | PRN

## 2016-03-31 MED ORDER — NIFEDIPINE ER 60 MG PO TB24: 60.0000 mg | ORAL_TABLET | Freq: Every day | ORAL | Status: AC

## 2016-03-31 MED ORDER — BENZONATATE 200 MG PO CAPS: 200.0000 mg | ORAL_CAPSULE | Freq: Two times a day (BID) | ORAL | Status: DC | PRN

## 2016-03-31 MED ORDER — INSULIN LISPRO PROT & LISPRO (75-25 MIX) 100 UNIT/ML KWIKPEN: 20.0000 [IU] | PEN_INJECTOR | Freq: Two times a day (BID) | SUBCUTANEOUS | Status: DC

## 2016-03-31 MED ORDER — METOPROLOL SUCCINATE ER 50 MG PO TB24: 50.0000 mg | ORAL_TABLET | Freq: Every day | ORAL | Status: DC

## 2016-03-31 NOTE — Procedures (Signed)
I was present at this dialysis session. I have reviewed the session itself and made appropriate changes.   Feels well.  UF goal 2.7L, challenging down to 91.5kg today.  OK with DC>  Next HD 7/14.  Filed Weights   03/29/16 2033 03/30/16 2133 03/31/16 0645  Weight: 92.8 kg (204 lb 9.4 oz) 96.1 kg (211 lb 13.8 oz) 93.7 kg (206 lb 9.1 oz)     Recent Labs Lab 03/31/16 0650  NA 133*  K 4.3  CL 95*  CO2 25  GLUCOSE 101*  BUN 39*  CREATININE 8.67*  CALCIUM 8.8*  PHOS 4.6     Recent Labs Lab 03/28/16 0020 03/29/16 0702 03/31/16 0650  WBC 6.5 5.4 5.6  NEUTROABS 4.2  --   --   HGB 8.9* 9.3* 8.8*  HCT 28.3* 29.8* 28.7*  MCV 75.1* 74.1* 74.2*  PLT 115* 128* 149*    Scheduled Meds: . aspirin  81 mg Oral Daily  . cloNIDine  0.2 mg Oral TID  . heparin  5,000 Units Subcutaneous Q8H  . insulin aspart  0-9 Units Subcutaneous TID WC  . insulin aspart protamine- aspart  16 Units Subcutaneous Q supper  . insulin aspart protamine- aspart  26 Units Subcutaneous Q breakfast  . levothyroxine  125 mcg Oral QAC breakfast  . metoprolol succinate  100 mg Oral Q supper  . NIFEdipine  90 mg Oral Daily  . pantoprazole  80 mg Oral Q1200  . pravastatin  40 mg Oral QHS  . pregabalin  75 mg Oral BID  . senna-docusate  1 tablet Oral BID  . sevelamer carbonate  1,600 mg Oral TID WC   Continuous Infusions:  PRN Meds:.acetaminophen **OR** acetaminophen, benzonatate, guaiFENesin, heparin, heparin, hydrALAZINE, levalbuterol, ondansetron **OR** ondansetron (ZOFRAN) IV, oxyCODONE, promethazine, sevelamer carbonate   Pearson Grippe  MD 03/31/2016, 8:44 AM

## 2016-03-31 NOTE — Progress Notes (Signed)
Discharge instructions, Prescriptions, Medications and follow up appointments reviewed with the patient and family.  All voice understanding to teaching. Copy of D/C instructions given to the patient. Ambulatory to the door.  Home via [place] with her daughter driving.

## 2016-03-31 NOTE — Discharge Summary (Addendum)
Physician Discharge Summary  [name] [id number] DOB: 1960/10/03 DOA: 03/27/2016  PCP: Karina Hey, MD  Admit date: 03/27/2016 Discharge date: 03/31/2016  Recommendations for Outpatient Follow-up:  1. 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 female visiting from [place], [place] with history of ESRD on HD M/W/F, HTN, DM2, paroxysmal SVT, hypothyroidism, and anemia of chronic disease who presented with shortness of breath and cough. Pt was hospitalized at [facility] 2 weeks prior for pneumonia, and completed antibiotics one week prior. She had her regularly scheduled dialysis 7/5. She woke up in the middle of the night wheezing and short of breath, and her sx ultimatley worsened over the next 24hrs. Patient reports her dry weight is 95 kg.  Assessment & Plan:  Idiopathic acute hypoxic respiratory failure - ? insufficienct HD as outpt + malignant HTN in setting of diastolic CHF > flash pulmonary edema w/o total body volume overload  - much improved at this time  - respiratory status stable on RA   Malignant hypertension - SBP in 150's, improving  - tolerating HD and BP controlled with HD   ESRD on M/W/F HD - Left arm AV graft - pt reports her dry weight is 95kg, but during similar admit in Feb it was actually lowered to 91kg  - HD tolerated well - nephrology team cleared for discharge   Acute on chronic Grade 2 diastolic congestive heart failure (TTE Feb 2017) - with evidence of volume overload on admission requiring HD - ECHO with stable EF,  confirms diastolic CHF   Recent pneumonia - Procalcitonin not convincing for acute infection  - no clear indication for antibiotics at this time  Paroxysmal SVT - Well-controlled at present  Peripheral arterial disease - No acute complications  Anemia of chronic kidney disease  - Hemoglobin stable, no signs of bleeding   Sickle cell trait  DM 2 with complications of nephropathy  - CBG is currently well controlled  Thrombocytopenia - mild, monitor   Hypothyroidism - Continue home Synthroid dose  Hyperlipidemia - Continue home Pravachol dose  GERD - Continue PPI I as per home  DVT prophylaxis: SQ heparin Code Status: FULL CODE Family Communication: no family present at time of exam  Disposition Plan: Home   Consultants:  Nephrology  Procedures: None   Antimicrobials:  None   Discharge Exam: Filed Vitals:   03/31/16 0930 03/31/16 1000  BP: 103/55 98/56  Pulse: 59 58  Temp:    Resp:     Filed Vitals:   03/31/16 0830 03/31/16 0900 03/31/16 0930 03/31/16 1000  BP: 99/58 108/57 103/55 98/56  Pulse: 58 56 59 58  Temp:      TempSrc:      Resp:      Weight:      SpO2:        General: Pt is alert, follows commands appropriately, not in acute distress Cardiovascular: Regular rate and rhythm, S1/S2 +, no murmurs, no rubs, no gallops Respiratory: Clear to auscultation bilaterally, no wheezing, no crackles, no rhonchi Abdominal: Soft, non tender, non distended, bowel sounds +,  no guarding  Discharge Instructions  Discharge Instructions    Diet - low sodium heart healthy    Complete by:  As directed      Increase activity slowly    Complete by:  As directed             Medication List    TAKE these medications        aspirin 81 MG chewable tablet  Chew 81 mg by mouth daily.     benzonatate 200 MG capsule  Commonly known as:  TESSALON  Take 1 capsule (200 mg total) by mouth 2 (two) times daily as needed for cough.     cloNIDine 0.2 MG  tablet  Commonly known as:  CATAPRES  Take 0.2 mg by mouth 2 (two) times daily.     diphenhydrAMINE 25 mg capsule  Commonly known as:  BENADRYL  Take 25 mg by mouth every 6 (six) hours as needed for allergies.     esomeprazole 40 MG capsule  Commonly known as:  NEXIUM  Take 40 mg by mouth daily before breakfast.     Insulin Lispro Prot & Lispro (75-25) 100 UNIT/ML Kwikpen  Commonly known as:  HUMALOG MIX 75/25 KWIKPEN  Inject 20-30 Units into the skin 2 (two) times daily with a meal. 30 units in the morning and 0-20 units in the evening (depending on BGL)     levothyroxine 125 MCG tablet  Commonly known as:  SYNTHROID, LEVOTHROID  Take 125 mcg by mouth daily before breakfast.     metoprolol succinate 50 MG 24 hr tablet  Commonly known as:  TOPROL-XL  Take 1 tablet (50 mg total) by mouth daily. Take with or immediately following a meal.     NIFEdipine 60 MG 24 hr tablet  Commonly known as:  PROCARDIA-XL/ADALAT CC  Take 1 tablet (60 mg total) by mouth daily.     oxyCODONE-acetaminophen 10-325 MG tablet  Commonly known as:  PERCOCET  Take 1 tablet by mouth every 6 (six) hours as needed for pain.     pravastatin 40 MG tablet  Commonly known as:  PRAVACHOL  Take 40 mg by mouth at bedtime.     pregabalin 75 MG capsule  Commonly known as:  LYRICA  Take 75 mg by mouth 2 (two) times daily.     promethazine 25 MG tablet  Commonly known as:  PHENERGAN  Take 25 mg by mouth every 8 (eight) hours as needed for nausea or vomiting.     sevelamer carbonate 800 MG tablet  Commonly known as:  RENVELA  Take 800-1,600 mg by mouth 3 (three) times daily with meals. Take 2 tablets (1600 mg) by mouth with meals, and 1 tablet (800 mg) with snacks            Follow-up Information    Follow up with Karina Hey, MD.   Specialty:  Family Medicine   Contact information:   [address] [place] [address] [phone]       Call Faye Ramsay, MD.   Specialty:   Internal Medicine   Why:  As needed   Contact information:   [address] [place] [address] [phone]        The results of significant diagnostics from this hospitalization (including imaging, microbiology, ancillary and laboratory) are listed below for reference.     Microbiology: Recent Results (from the past 240 hour(s))  MRSA PCR Screening     Status: None  Collection Time: 03/28/16  6:15 AM  Result Value Ref Range Status   MRSA by PCR NEGATIVE NEGATIVE Final    Comment:        The GeneXpert MRSA Assay (FDA approved for NASAL specimens only), is one component of a comprehensive MRSA colonization surveillance program. It is not intended to diagnose MRSA infection nor to guide or monitor treatment for MRSA infections.      Labs: Basic Metabolic Panel:  Recent Labs Lab 03/28/16 0020 03/29/16 1330 03/31/16 0650  NA 137  136 136 133*  K 5.0  5.0 4.5 4.3  CL 102  102 100* 95*  CO2 24  23 26 25   GLUCOSE 197*  199* 81 101*  BUN 54*  54* 30* 39*  CREATININE 8.54*  8.50* 7.06* 8.67*  CALCIUM 8.3*  8.3* 9.0 8.8*  PHOS  --  3.8 4.6   Liver Function Tests:  Recent Labs Lab 03/28/16 0020 03/29/16 1330 03/31/16 0650  AST 18  --   --   ALT 14  --   --   ALKPHOS 149*  --   --   BILITOT 0.6  --   --   PROT 7.0  --   --   ALBUMIN 3.5 3.3* 3.2*   No results for input(s): LIPASE, AMYLASE in the last 168 hours. No results for input(s): AMMONIA in the last 168 hours. CBC:  Recent Labs Lab 03/28/16 0020 03/29/16 0702 03/31/16 0650  WBC 6.5 5.4 5.6  NEUTROABS 4.2  --   --   HGB 8.9* 9.3* 8.8*  HCT 28.3* 29.8* 28.7*  MCV 75.1* 74.1* 74.2*  PLT 115* 128* 149*   Cardiac Enzymes:  Recent Labs Lab 03/28/16 0020 03/28/16 1138 03/29/16 0307  TROPONINI <0.03 <0.03 <0.03   BNP: BNP (last 3 results)  Recent Labs  11/03/15 1940  BNP 822.1*    ProBNP (last 3 results) No results for input(s): PROBNP in the last 8760  hours.  CBG:  Recent Labs Lab 03/30/16 1722 03/30/16 1830 03/30/16 2131 03/30/16 2304 03/31/16 0444  GLUCAP 54* 80 65 88 87   SIGNED: Time coordinating discharge: 30 minutes  MAGICK-MYERS, ISKRA, MD  [facility] 03/31/2016, 10:33 AM Pager [phone]  If 7PM-7AM, please contact night-coverage [email or web address] Password [email or web address]

## 2016-03-31 NOTE — Progress Notes (Signed)
Inpatient Diabetes Program Recommendations  AACE/ADA: New Consensus Statement on Inpatient Glycemic Control (2015)  Target Ranges:  Prepandial:   less than 140 mg/dL      Peak postprandial:   less than 180 mg/dL (1-2 hours)      Critically ill patients:  140 - 180 mg/dL    Inpatient Diabetes Program Recommendations:  Reviewed CBGs. Please consider decrease in 70/30 insulin while eating poorly. Will follow.  Thank you, Nani Gasser. Karina Dimock, RN, MSN, CDE Inpatient Glycemic Control Team Team Pager [phone] (8am-5pm) 03/31/2016 3:27 PM

## 2016-03-31 NOTE — Discharge Instructions (Signed)
Anemia, Nonspecific Anemia is a condition in which the concentration of red blood cells or hemoglobin in the blood is below normal. Hemoglobin is a substance in red blood cells that carries oxygen to the tissues of the body. Anemia results in not enough oxygen reaching these tissues.  CAUSES  Common causes of anemia include:   Excessive bleeding. Bleeding may be internal or external. This includes excessive bleeding from periods (in women) or from the intestine.   Poor nutrition.   Chronic kidney, thyroid, and liver disease.  Bone marrow disorders that decrease red blood cell production.  Cancer and treatments for cancer.  HIV, AIDS, and their treatments.  Spleen problems that increase red blood cell destruction.  Blood disorders.  Excess destruction of red blood cells due to infection, medicines, and autoimmune disorders. SIGNS AND SYMPTOMS   Minor weakness.   Dizziness.   Headache.  Palpitations.   Shortness of breath, especially with exercise.   Paleness.  Cold sensitivity.  Indigestion.  Nausea.  Difficulty sleeping.  Difficulty concentrating. Symptoms may occur suddenly or they may develop slowly.  DIAGNOSIS  Additional blood tests are often needed. These help your health care provider determine the best treatment. Your health care provider will check your stool for blood and look for other causes of blood loss.  TREATMENT  Treatment varies depending on the cause of the anemia. Treatment can include:   Supplements of iron, vitamin B12, or folic acid.   Hormone medicines.   A blood transfusion. This may be needed if blood loss is severe.   Hospitalization. This may be needed if there is significant continual blood loss.   Dietary changes.  Spleen removal. HOME CARE INSTRUCTIONS Keep all follow-up appointments. It often takes many weeks to correct anemia, and having your health care provider check on your condition and your response to  treatment is very important. SEEK IMMEDIATE MEDICAL CARE IF:   You develop extreme weakness, shortness of breath, or chest pain.   You become dizzy or have trouble concentrating.  You develop heavy vaginal bleeding.   You develop a rash.   You have bloody or black, tarry stools.   You faint.   You vomit up blood.   You vomit repeatedly.   You have abdominal pain.  You have a fever or persistent symptoms for more than 2-3 days.   You have a fever and your symptoms suddenly get worse.   You are dehydrated.  MAKE SURE YOU:  Understand these instructions.  Will watch your condition.  Will get help right away if you are not doing well or get worse.   This information is not intended to replace advice given to you by your health care provider. Make sure you discuss any questions you have with your health care provider.   Document Released: 10/14/2004 Document Revised: 05/09/2013 Document Reviewed: 03/02/2013 Elsevier Interactive Patient Education 2016 [employer] Inc[employer]  

## 2016-07-14 IMAGING — DX DG TIBIA/FIBULA 2V*R*
4 series · 4 of 4 positions shown · non-contrast
Comparison: None.

CLINICAL DATA: Right tibia fibula knot, pain  to touch

EXAM:
RIGHT TIBIA AND FIBULA - 2 VIEW

[t tib-fib ap right (1 of 2)]
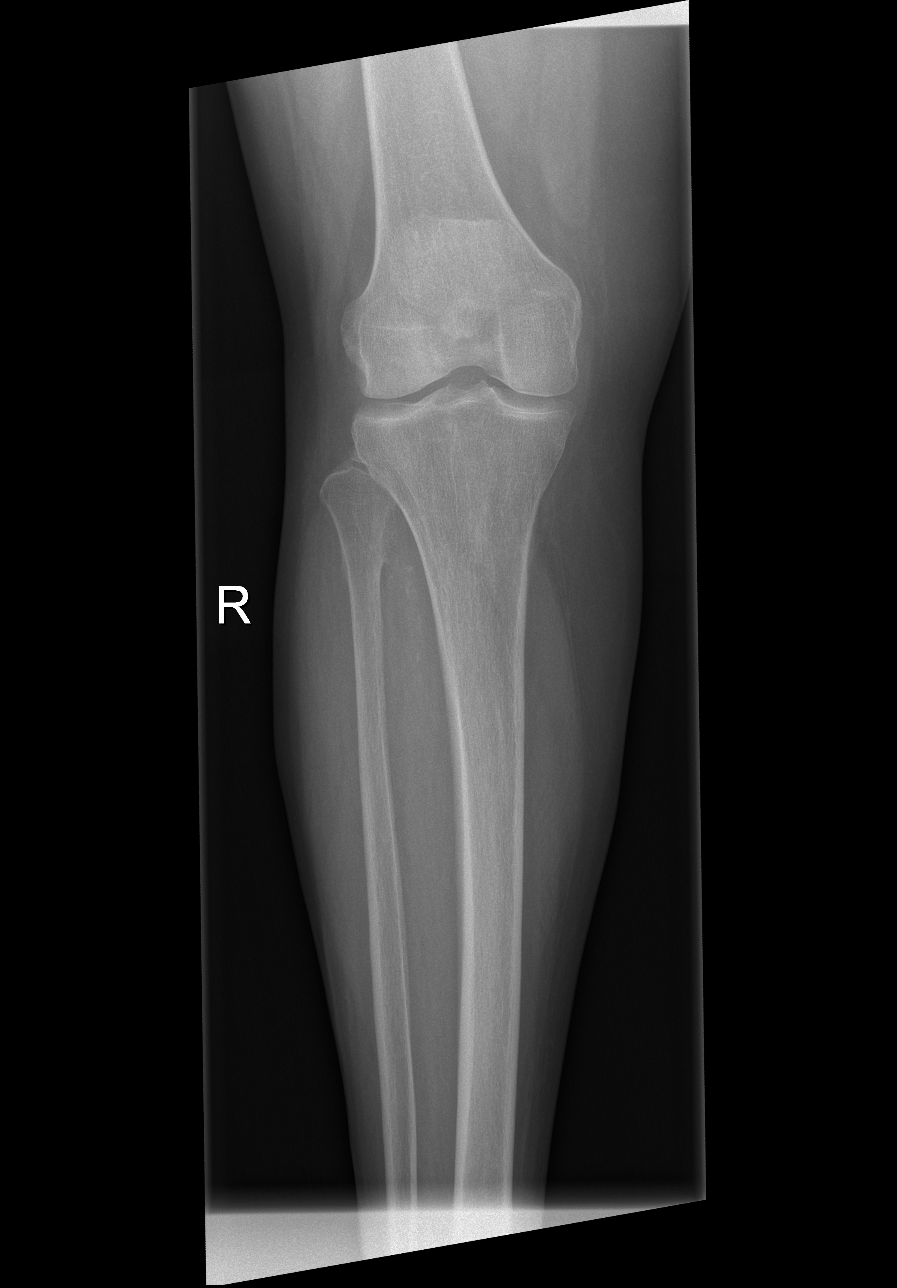

[t tib-fib ap right (2 of 2)]
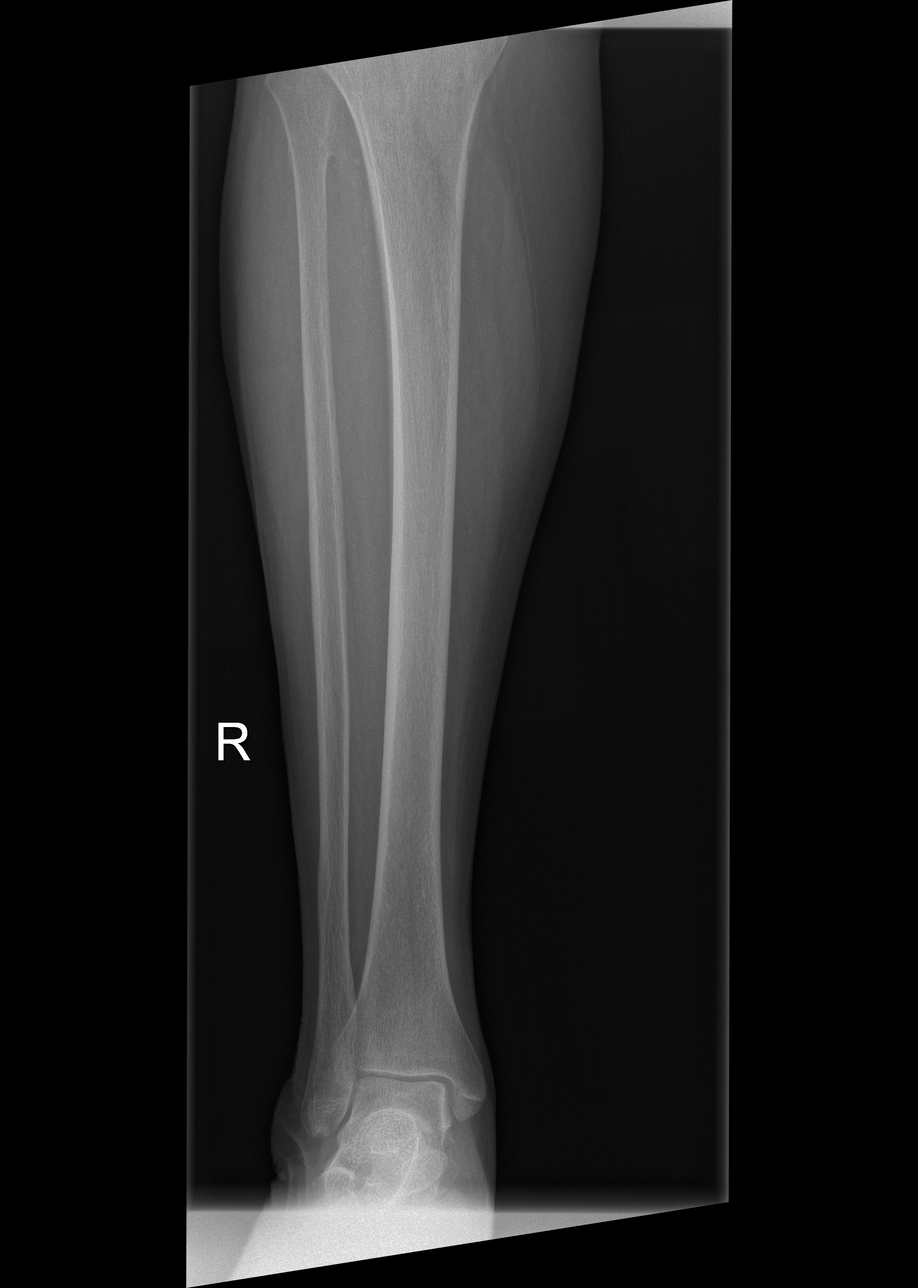

[t tib-fib lat right (1 of 2)]
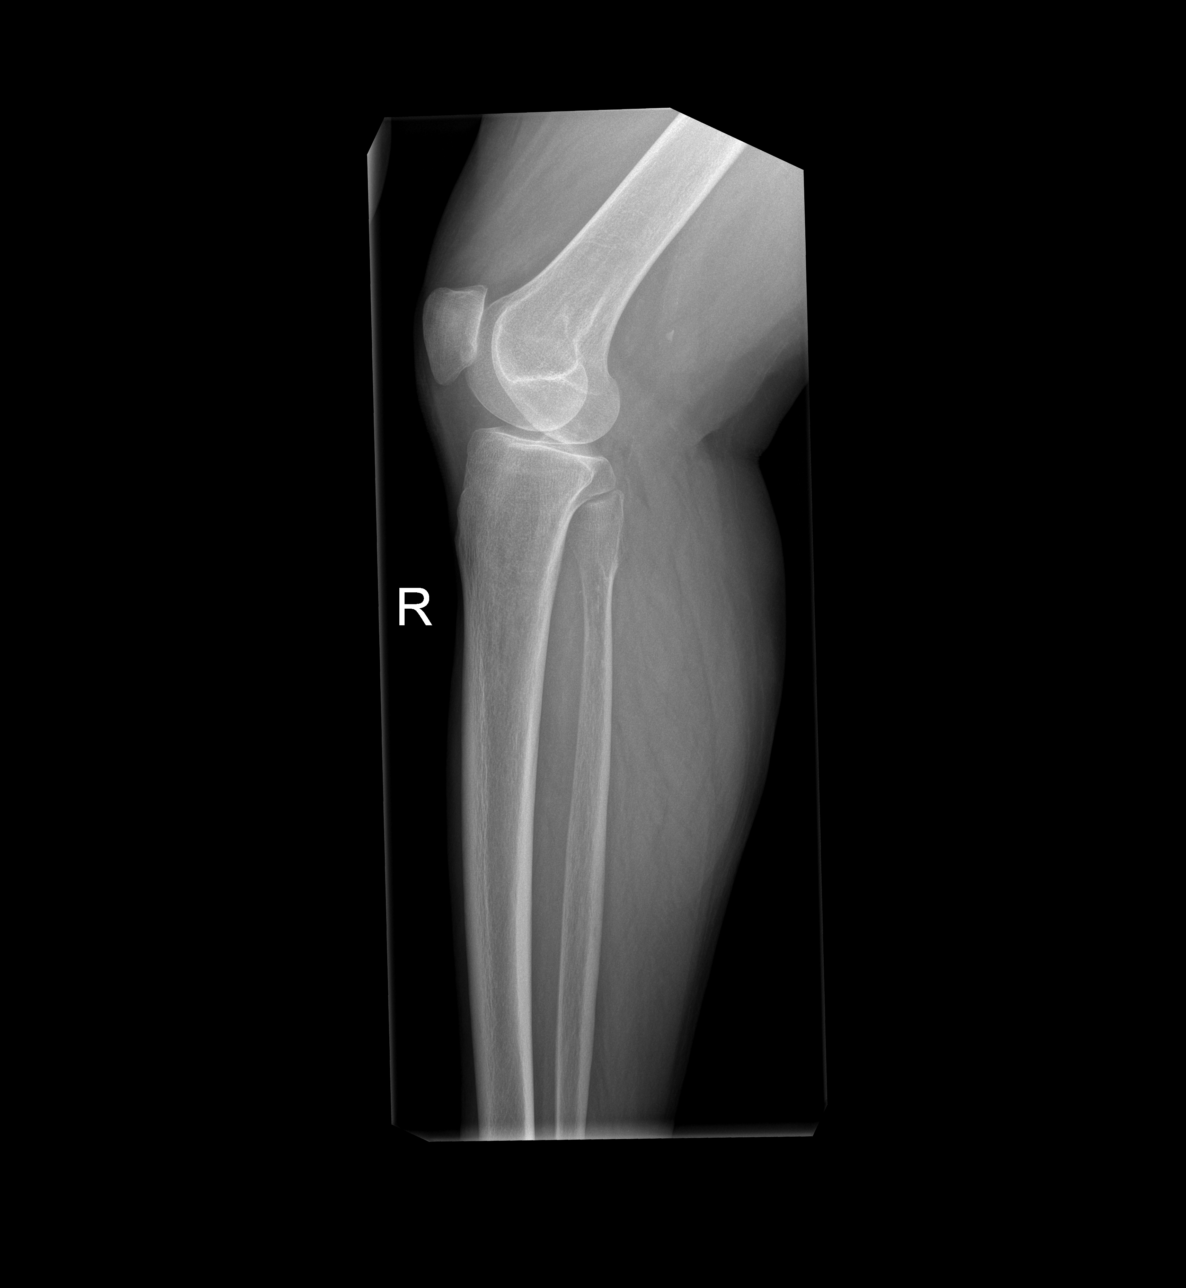

[t tib-fib lat right (2 of 2)]
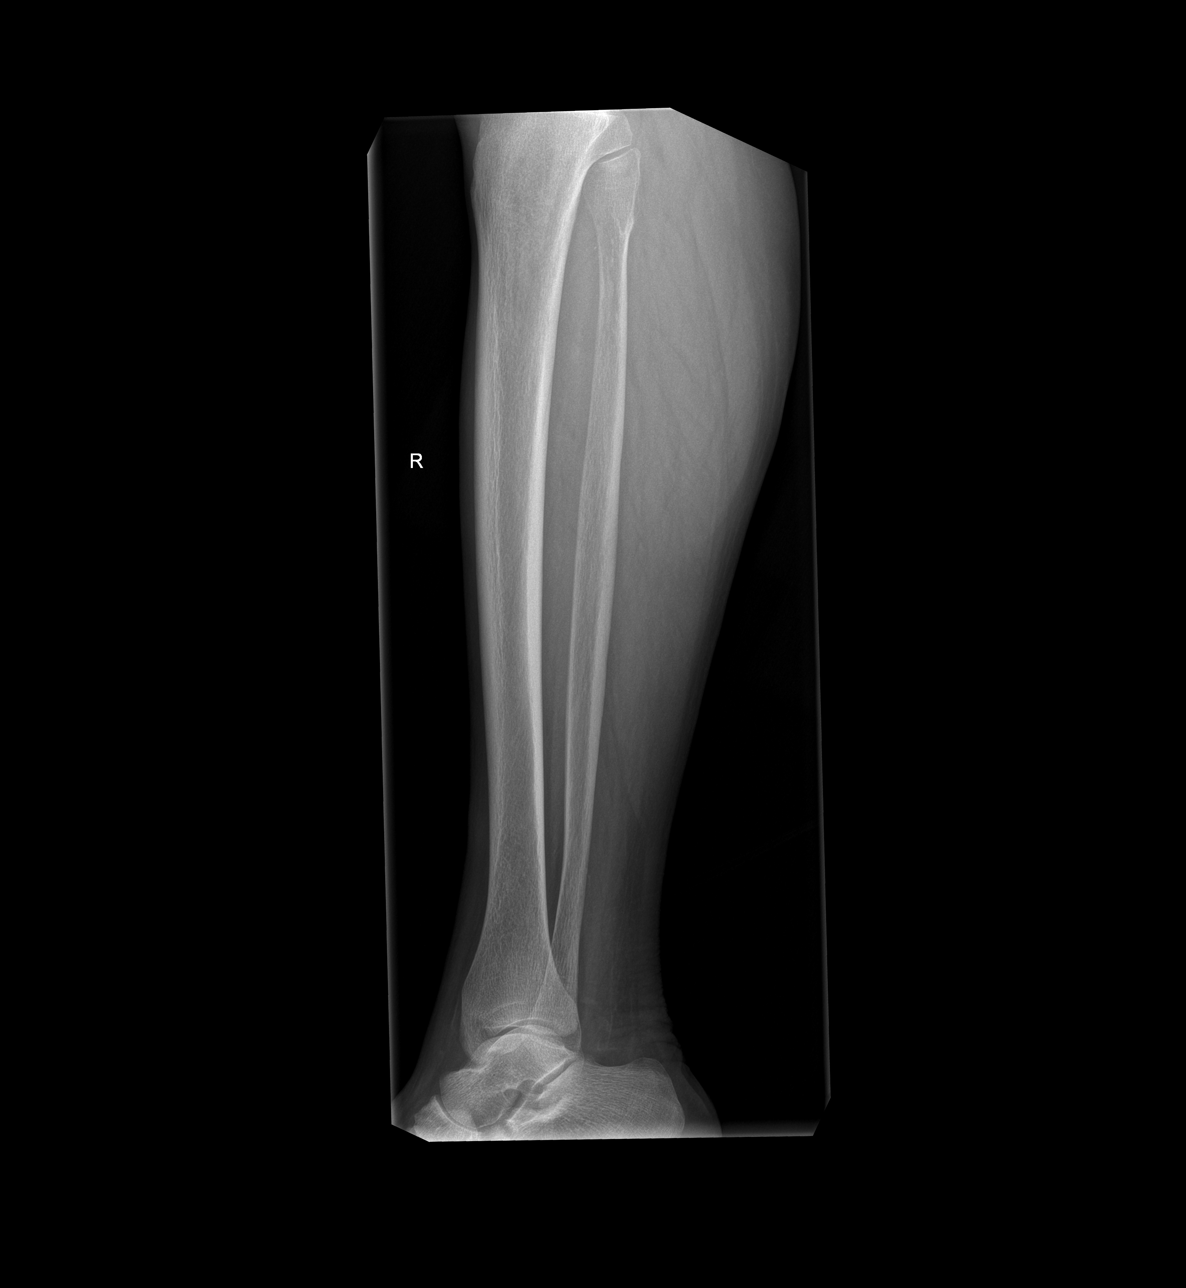

[4 of 4 positions shown; findings below may reference images not displayed]

FINDINGS: Four views of the right tibia-fibula submitted. No acute fracture or
subluxation. No bony lesion is noted. No periosteal reaction or bony
erosion. Visualized knee joint and ankle joint are unremarkable.
IMPRESSION: Negative.

## 2018-01-17 ENCOUNTER — Emergency Department (HOSPITAL_COMMUNITY): Payer: Medicare Other

## 2018-01-17 ENCOUNTER — Emergency Department (HOSPITAL_COMMUNITY)
Admission: EM | Admit: 2018-01-17 | Discharge: 2018-01-17 | Disposition: A | Discharge status: Home / Self Care | Payer: Medicare Other | Attending: Emergency Medicine | Admitting: Emergency Medicine

## 2018-01-17 ENCOUNTER — Other Ambulatory Visit: Payer: Self-pay

## 2018-01-17 ENCOUNTER — Encounter (HOSPITAL_COMMUNITY): Payer: Self-pay | Admitting: Emergency Medicine

## 2018-01-17 DIAGNOSIS — N186 End stage renal disease: Secondary | ICD-10-CM | POA: Insufficient documentation

## 2018-01-17 DIAGNOSIS — Z992 Dependence on renal dialysis: Secondary | ICD-10-CM | POA: Diagnosis not present

## 2018-01-17 DIAGNOSIS — E039 Hypothyroidism, unspecified: Secondary | ICD-10-CM | POA: Diagnosis not present

## 2018-01-17 DIAGNOSIS — M79605 Pain in left leg: Secondary | ICD-10-CM | POA: Diagnosis present

## 2018-01-17 DIAGNOSIS — I120 Hypertensive chronic kidney disease with stage 5 chronic kidney disease or end stage renal disease: Secondary | ICD-10-CM | POA: Insufficient documentation

## 2018-01-17 DIAGNOSIS — W19XXXA Unspecified fall, initial encounter: Secondary | ICD-10-CM

## 2018-01-17 DIAGNOSIS — E119 Type 2 diabetes mellitus without complications: Secondary | ICD-10-CM | POA: Diagnosis not present

## 2018-01-17 DIAGNOSIS — Z87891 Personal history of nicotine dependence: Secondary | ICD-10-CM | POA: Insufficient documentation

## 2018-01-17 MED ORDER — NAPROXEN 500 MG PO TABS: 500.0000 mg | ORAL_TABLET | Freq: Two times a day (BID) | ORAL | 0 refills | Status: DC

## 2018-01-17 MED ORDER — METHOCARBAMOL 500 MG PO TABS: 500.0000 mg | ORAL_TABLET | Freq: Every evening | ORAL | 0 refills | Status: DC | PRN

## 2018-01-17 NOTE — ED Triage Notes (Signed)
Pt states she slipped on the stairs and hit her left knee/ankle on the way down. Denies LOC. Denies hitting head. Complains of swelling to left ankle and pain in left leg.

## 2018-01-17 NOTE — Discharge Instructions (Signed)
Take naproxen 2 times a day with meals.  Do not take other anti-inflammatories at the same time open (Advil, Motrin, ibuprofen, Aleve).  Use the muscle relaxer as needed for continued muscle pain.  Have caution, as this may be tired or groggy.  Do not drive or operate heavy machinery while taking this medicine. Use heating pads to help control your pain. Use muscle cream such as BenGay, icy hot, or salonpas to help with pain. Do gentle stretches. Follow-up with primary care in 1 week if your pain is not improving.  Return to the emergency room if you develop numbness, color change of your foot, your leg becomes hard, you have severe pain, or any new or concerning symptoms.

## 2018-01-17 NOTE — ED Provider Notes (Signed)
Patient placed in Quick Look pathway, seen and evaluated   Chief Complaint: Left leg pain  HPI:   57 year old female with past medical history of end-stage renal disease who presents for evaluation of left lower extremity pain after mechanical fall that occurred this morning.  She states she was walking down steps when she lost her balance, causing her to fall down approximately 4 steps.  Patient reports she did not hit her head or lose consciousness.  She is able to recall the entire event.  Patient reports that she has not been able to ambulate or bear weight on the left lower extremity since the incident.  Patient reports that she went to dialysis was having continued and worsening pain in the left lower extremity, prompting ED visit.  She states she has not taken anything for the pain.  Patient denies any vision changes, head injury, back pain, neck pain, numbness/weakness.  ROS: LLE pain  Physical Exam:   Gen: No distress  Neuro: Awake and Alert  Skin: Warm    Focused Exam: Tenderness palpation noted to left knee, proximal left tib-fib, left ankle.  No deformity or crepitus noted.  There is some overlying soft tissue swelling of left ankle.  No C, T, L midline tenderness.   Initiation of care has begun. The patient has been counseled on the process, plan, and necessity for staying for the completion/evaluation, and the remainder of the medical screening examination   Desma Mcgregor 01/17/18 1541    Mesner, Corene Cornea, MD 01/19/18 [phone]

## 2018-01-17 NOTE — ED Provider Notes (Signed)
[facility] EMERGENCY DEPARTMENT Provider Note   CSN: [id number] Arrival date & time: 01/17/18  1511     History   Chief Complaint Chief Complaint  Patient presents with  . Fall    HPI [name] is a 57 y.o. female presenting for evaluation of left leg pain after a fall.  Patient states she had a mechanical fall when going down the stairs today.  Her leg slipped out from under her, and she fell landing on her left leg.  She reports acute onset of pain at that time.  Pain has gradually worsened, is a constant throbbing.  Worse with ambulation.  Nothing has made it better. She denies numbness or tingling.  She has not taken anything for pain including Tylenol or ibuprofen.  She denies pain in her head, neck, or back.  She denies hitting her head or loss of consciousness.  She is not on blood thinners.  She denies loss of bowel or bladder control.  HPI  Past Medical History:  Diagnosis Date  . 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 Arthritis   . Diabetes mellitus, type II, insulin dependent ([place])   . Diabetic peripheral neuropathy associated with type 2 diabetes mellitus ([place])   . End stage renal disease on dialysis ([place])    Hemodialysis T, TH, Sat  . Essential hypertension    Difficult control. On multiple medications.  . Gastroparesis due to DM ([place])   . GERD (gastroesophageal reflux disease)   . Heart murmur   . Hemodialysis patient ([place])   . Hyperlipidemia   . Hypothyroidism (acquired)   . Paroxysmal supraventricular tachycardia ([place])   . Peripheral arterial disease [facility]) December 2014   @ [place]: a) RSFA PTA (01/2013); b) L SFA [facility] Atherectomy/PTA with 3V runoff; LEA Dopplers January 2016 [facility] post L SFA PTA): Mild, insignificant disease in the left CFA, profunda, SFA, popliteal artery and tibioperoneal trunk.  . Sickle cell trait [facility])     Patient Active Problem List   Diagnosis Date Noted  . Acute pulmonary edema ([place]) 03/28/2016  . Fluid overload 03/28/2016  . HCAP (healthcare-associated pneumonia) 11/09/2015  . Sinus tachycardia   . Acute hypoxemic respiratory failure ([place]) 11/03/2015  . Hypoxia 06/11/2015  . Pleuritic chest pain 06/11/2015  . Diabetes mellitus, type II, insulin dependent ([place])   . Hypothyroidism (acquired)   . Hyperlipidemia   . 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 Diabetic peripheral neuropathy associated with type 2 diabetes mellitus ([place])   . Hyperkalemia 10/07/2013  . Peripheral arterial disease ([place]) 08/20/2013  . 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/08/2013  . Sepsis ([place]) 06/05/2013  . Pain in limb-Left upper arm 04/20/2013  . Mechanical complication of other vascular device, implant, and graft 04/06/2013  . End stage renal disease on dialysis due to type 2 diabetes mellitus ([place]) 04/06/2013  . Cardiac arrest ([place]) 07/11/2012  . Obesity 07/11/2012  . Acute respiratory failure with hypoxia ([place]) 07/11/2012  . Essential hypertension: Associated with renal disease   . Thyroid disease   . Paroxysmal supraventricular tachycardia ([place]) 03/25/2010    Past Surgical History:  Procedure Laterality Date  . ABDOMINAL HYSTERECTOMY    . ARTERIOVENOUS GRAFT PLACEMENT Left 2012   Dr. Harrington Challenger  2nd graft  . Carotid ultrasound   May 2011   Normal ([place], [place])  . CATARACT EXTRACTION, BILATERAL    . COLONOSCOPY  07/19/2012   Procedure: COLONOSCOPY;  Surgeon: Beryle Beams, MD;  Location: [facility];  Service: Endoscopy;  Laterality: N/A;  . FISTULOGRAM Left 06/04/2015   Procedure: FISTULOGRAM;  Surgeon: Conrad [place], MD;  Location: [facility];  Service: Vascular;  Laterality: Left;  . KNEE ARTHROPLASTY    . KNEE SURGERY     right  . NM MYOVIEW LTD  September 2009; 01/15/2015   a) @ [facility] - Negative for inducible or reversible ischemia with pharmacologic stress. EF 63%  ; b) [facility]: EF 68%. No RWMA, negative S4 reversible coronary ischemia or infarction.  [facility] PERIPHERAL ANGIOPLASTY  May 2014   PTA of R SFA-PopA.  [facility] PERIPHERAL ATHRECTOMY  December 2014   [facility] Atherectomy of L SFA-L PopA reduction of 60% to 10% stenosis   . PERIPHERAL VASCULAR CATHETERIZATION  May 2014; December 2014   a) RSFA & PopA mod-severe disease -- R SFA PTA, Med Rx of L SFA; b) distal abdominal and iliac & CFA arteries patent.  m-d LSFA long 60%, LPop 60% -> 3 V runoff. RSFA ~40% mid, RPop 60%, 3V runoff  . REVISION OF ARTERIOVENOUS GORETEX GRAFT Left 06/04/2015   Procedure: REVISION OF ARTERIOVENOUS GORETEX GRAFT;  Surgeon: Conrad Dodge, MD;  Location: [facility];  Service: Vascular;  Laterality: Left;  . SHUNTOGRAM Left 02/14/2013   Procedure: FISTULOGRAM;  Surgeon: Serafina Mitchell, MD;  Location: [facility] CATH LAB;  Service: Cardiovascular;  Laterality: Left;  . THYROID SURGERY    . THYROIDECTOMY    . TRANSTHORACIC ECHOCARDIOGRAM  October 2013; January 2015   a) [name]) 2013: normal LV size and function. Mild LVH. EF 55-60%. no RWMA, No valve disease; b) [facility] 1/'15: EF 70-75%, mild MR. Normal diastolic dysfunction     OB History    Gravida      Para      Term      Preterm      AB      Living  3     SAB      TAB      Ectopic      Multiple      Live Births               Home Medications    Prior to Admission medications   Medication Sig Start Date End Date Taking? Authorizing Provider  aspirin 81 MG chewable tablet Chew 81 mg by mouth daily.    [provider]  benzonatate (TESSALON) 200 MG capsule Take 1 capsule (200 mg total) by mouth 2 (two) times daily as needed for cough. 03/31/16   Theodis Blaze, MD  cloNIDine (CATAPRES) 0.2 MG tablet Take 0.2 mg by mouth 2 (two) times daily.     [provider]  diphenhydrAMINE (BENADRYL) 25 mg capsule Take 25 mg by mouth every 6 (six) hours as needed for allergies.    [provider]  esomeprazole (NEXIUM) 40 MG capsule Take 40 mg by mouth daily before breakfast.    [provider]  Insulin Lispro Prot & Lispro (HUMALOG MIX 75/25 KWIKPEN) (75-25) 100 UNIT/ML Kwikpen Inject 20-30 Units into the skin 2 (two) times daily with a meal. 30 units in the morning and 0-20 units in the evening (depending on BGL) 03/31/16   Theodis Blaze, MD  levothyroxine (SYNTHROID, LEVOTHROID) 125 MCG tablet Take 125 mcg by mouth daily before breakfast.     [provider]  methocarbamol (ROBAXIN) 500 MG  tablet Take 1 tablet (500 mg total) by mouth at bedtime as needed for muscle spasms. 01/17/18   Taimur Fier, PA-C  metoprolol succinate (TOPROL-XL) 50 MG 24 hr tablet Take 1 tablet (50 mg total) by mouth daily. Take with or immediately following a meal. 03/31/16   Theodis Blaze, MD  naproxen (NAPROSYN) 500 MG tablet Take 1 tablet (500 mg total) by mouth 2 (two) times daily with a meal. 01/17/18   Krystena Reitter, PA-C  NIFEdipine (PROCARDIA-XL/ADALAT CC) 60 MG 24 hr tablet Take 1 tablet (60 mg total) by mouth daily. 03/31/16   Theodis Blaze, MD  oxyCODONE-acetaminophen (PERCOCET) 10-325 MG tablet Take 1 tablet by mouth every 6 (six) hours as needed for pain. 03/31/16   Theodis Blaze, MD  pravastatin (PRAVACHOL) 40 MG tablet Take 40 mg by mouth at bedtime.     [provider]  pregabalin (LYRICA) 75 MG capsule Take 75 mg by mouth 2 (two) times daily.    [provider]  promethazine (PHENERGAN) 25 MG tablet Take 25 mg by mouth every 8 (eight) hours as needed for nausea or vomiting.     [provider]  sevelamer carbonate (RENVELA) 800 MG tablet Take 800-1,600 mg by mouth 3 (three) times daily with meals. Take 2 tablets (1600 mg) by mouth with meals, and 1 tablet (800 mg) with snacks    [provider]    Family History Family History  Problem Relation Age of Onset  . Diabetes Mother   . Hypertension Mother   . Heart attack  Mother   . Diabetes Father   . Hypertension Father     Social History Social History   Tobacco Use  . Smoking status: Former Smoker    Types: Cigarettes    Last attempt to quit: 09/20/1993    Years since quitting: 24.3  . Smokeless tobacco: Never Used  Substance Use Topics  . Alcohol use: No    Alcohol/week: 0.0 oz  . Drug use: Yes    Types: Marijuana    Comment: abused drugs in the past     Allergies   Patient has no known allergies.   Review of Systems Review of Systems  Musculoskeletal: Positive for myalgias.  Neurological: Negative for numbness.  Hematological: Does not bruise/bleed easily.     Physical Exam Updated Vital Signs BP (!) 190/83 (BP Location: Right Arm)   Pulse (!) 102   Temp 97.8 F (36.6 C) (Oral)   Resp 16   SpO2 100%   Physical Exam  Constitutional: She is oriented to person, place, and time. She appears well-developed and well-nourished. No distress.  Sitting in wheelchair in NAD  HENT:  Head: Normocephalic and atraumatic.  No sign of head injury  Eyes: Pupils are equal, round, and reactive to light. EOM are normal.  Neck: Normal range of motion.  No TTP of neck, no c-spine pain or step offs  Cardiovascular: Normal rate, regular rhythm and intact distal pulses.  Pulmonary/Chest: Effort normal and breath sounds normal. No respiratory distress.  Abdominal: Soft. She exhibits no distension. There is no tenderness.  Musculoskeletal: She exhibits edema and tenderness.  TTP of lateral lower leg. Mild ankle swelling. Pedal pulses intact bilaterally. Soft compartments. No obvious  Contusions or hematomas. Sensation intact bilaterally. No obvious lac or deformity  Neurological: She is alert and oriented to person, place, and time. No sensory deficit.  Skin: Skin is warm. No rash noted.  Psychiatric: She has a normal mood and  affect.  Nursing note and vitals reviewed.    ED Treatments / Results  Labs (all labs ordered are listed, but only  abnormal results are displayed) Labs Reviewed - No data to display  EKG None  Radiology Dg Tibia/fibula Left  Result Date: 01/17/2018 CLINICAL DATA:  Pain. History of peripheral artery disease, end-stage renal disease on dialysis. EXAM: LEFT KNEE - COMPLETE 4+ VIEW; LEFT TIBIA AND FIBULA - 2 VIEW; LEFT ANKLE COMPLETE - 3+ VIEW COMPARISON:  None. FINDINGS: LEFT knee: No evidence of fracture, dislocation, or joint effusion. No evidence of arthropathy or other focal bone abnormality. Soft tissue planes are nonacute. Mild vascular calcifications. LEFT tibia and fibula: No fracture deformity or dislocation. No destructive bony lesions. Mild vascular calcifications. Mild soft tissue swelling without subcutaneous gas or radiopaque foreign bodies. LEFT ankle: No fracture deformity nor dislocation. The ankle mortise appears congruent and the tibiofibular syndesmosis intact. No destructive bony lesions. Soft tissue swelling without subcutaneous gas or radiopaque foreign bodies. IMPRESSION: Soft tissue swelling without acute osseous process. Electronically Signed   By: Elon Alas M.D.   On: 01/17/2018 16:39   Dg Ankle Complete Left  Result Date: 01/17/2018 CLINICAL DATA:  Pain. History of peripheral artery disease, end-stage renal disease on dialysis. EXAM: LEFT KNEE - COMPLETE 4+ VIEW; LEFT TIBIA AND FIBULA - 2 VIEW; LEFT ANKLE COMPLETE - 3+ VIEW COMPARISON:  None. FINDINGS: LEFT knee: No evidence of fracture, dislocation, or joint effusion. No evidence of arthropathy or other focal bone abnormality. Soft tissue planes are nonacute. Mild vascular calcifications. LEFT tibia and fibula: No fracture deformity or dislocation. No destructive bony lesions. Mild vascular calcifications. Mild soft tissue swelling without subcutaneous gas or radiopaque foreign bodies. LEFT ankle: No fracture deformity nor dislocation. The ankle mortise appears congruent and the tibiofibular syndesmosis intact. No destructive bony  lesions. Soft tissue swelling without subcutaneous gas or radiopaque foreign bodies. IMPRESSION: Soft tissue swelling without acute osseous process. Electronically Signed   By: Elon Alas M.D.   On: 01/17/2018 16:39   Dg Knee Complete 4 Views Left  Result Date: 01/17/2018 CLINICAL DATA:  Pain. History of peripheral artery disease, end-stage renal disease on dialysis. EXAM: LEFT KNEE - COMPLETE 4+ VIEW; LEFT TIBIA AND FIBULA - 2 VIEW; LEFT ANKLE COMPLETE - 3+ VIEW COMPARISON:  None. FINDINGS: LEFT knee: No evidence of fracture, dislocation, or joint effusion. No evidence of arthropathy or other focal bone abnormality. Soft tissue planes are nonacute. Mild vascular calcifications. LEFT tibia and fibula: No fracture deformity or dislocation. No destructive bony lesions. Mild vascular calcifications. Mild soft tissue swelling without subcutaneous gas or radiopaque foreign bodies. LEFT ankle: No fracture deformity nor dislocation. The ankle mortise appears congruent and the tibiofibular syndesmosis intact. No destructive bony lesions. Soft tissue swelling without subcutaneous gas or radiopaque foreign bodies. IMPRESSION: Soft tissue swelling without acute osseous process. Electronically Signed   By: Elon Alas M.D.   On: 01/17/2018 16:39    Procedures Procedures (including critical care time)  Medications Ordered in ED Medications - No data to display   Initial Impression / Assessment and Plan / ED Course  I have reviewed the triage vital signs and the nursing notes.  Pertinent labs & imaging results that were available during my care of the patient were reviewed by me and considered in my medical decision making (see chart for details).     Patient presenting for evaluation of left leg pain after a mechanical fall.  Physical exam reassuring, she is  neurovascularly intact.  X-rays viewed and interpreted by me, shows no fracture or dislocation.  Likely muscular pain.  Soft compartments.   Will treat with NSAIDs, muscle relaxers, muscle creams.  Discussed heat and stretching.  Follow-up with primary care as needed.  Return precautions given, including signs for compartment syndrome.  At this time, patient be safe for discharge.  Patient states she understands and agrees to plan.  Final Clinical Impressions(s) / ED Diagnoses   Final diagnoses:  Left leg pain  Fall, initial encounter    ED Discharge Orders        Ordered    naproxen (NAPROSYN) 500 MG tablet  2 times daily with meals     01/17/18 1700    methocarbamol (ROBAXIN) 500 MG tablet  At bedtime PRN     01/17/18 1700       Khi Mcmillen, PA-C 01/17/18 1734    Mesner, Corene Cornea, MD 01/19/18 [phone]

## 2022-04-26 ENCOUNTER — Emergency Department (HOSPITAL_COMMUNITY): Payer: Medicare Other

## 2022-04-26 ENCOUNTER — Observation Stay (HOSPITAL_COMMUNITY)
Admission: EM | Admit: 2022-04-26 | Discharge: 2022-04-27 | Disposition: A | Discharge status: Home Health | Payer: Medicare Other | Attending: Internal Medicine | Admitting: Internal Medicine

## 2022-04-26 ENCOUNTER — Observation Stay (HOSPITAL_COMMUNITY): Payer: Medicare Other

## 2022-04-26 ENCOUNTER — Encounter (HOSPITAL_COMMUNITY): Payer: Self-pay

## 2022-04-26 ENCOUNTER — Other Ambulatory Visit: Payer: Self-pay

## 2022-04-26 ENCOUNTER — Observation Stay (HOSPITAL_BASED_OUTPATIENT_CLINIC_OR_DEPARTMENT_OTHER): Payer: Medicare Other

## 2022-04-26 DIAGNOSIS — I509 Heart failure, unspecified: Secondary | ICD-10-CM | POA: Diagnosis not present

## 2022-04-26 DIAGNOSIS — D631 Anemia in chronic kidney disease: Secondary | ICD-10-CM | POA: Insufficient documentation

## 2022-04-26 DIAGNOSIS — E039 Hypothyroidism, unspecified: Secondary | ICD-10-CM | POA: Diagnosis not present

## 2022-04-26 DIAGNOSIS — R0902 Hypoxemia: Secondary | ICD-10-CM | POA: Diagnosis not present

## 2022-04-26 DIAGNOSIS — I5033 Acute on chronic diastolic (congestive) heart failure: Secondary | ICD-10-CM

## 2022-04-26 DIAGNOSIS — Z87891 Personal history of nicotine dependence: Secondary | ICD-10-CM | POA: Diagnosis not present

## 2022-04-26 DIAGNOSIS — R079 Chest pain, unspecified: Secondary | ICD-10-CM

## 2022-04-26 DIAGNOSIS — Z951 Presence of aortocoronary bypass graft: Secondary | ICD-10-CM | POA: Diagnosis not present

## 2022-04-26 DIAGNOSIS — I5030 Unspecified diastolic (congestive) heart failure: Secondary | ICD-10-CM | POA: Diagnosis not present

## 2022-04-26 DIAGNOSIS — E785 Hyperlipidemia, unspecified: Secondary | ICD-10-CM

## 2022-04-26 DIAGNOSIS — Z940 Kidney transplant status: Secondary | ICD-10-CM

## 2022-04-26 DIAGNOSIS — Z20822 Contact with and (suspected) exposure to covid-19: Secondary | ICD-10-CM | POA: Insufficient documentation

## 2022-04-26 DIAGNOSIS — Z7901 Long term (current) use of anticoagulants: Secondary | ICD-10-CM | POA: Diagnosis not present

## 2022-04-26 DIAGNOSIS — Z8616 Personal history of COVID-19: Secondary | ICD-10-CM | POA: Insufficient documentation

## 2022-04-26 DIAGNOSIS — I25810 Atherosclerosis of coronary artery bypass graft(s) without angina pectoris: Secondary | ICD-10-CM | POA: Diagnosis not present

## 2022-04-26 DIAGNOSIS — Z86718 Personal history of other venous thrombosis and embolism: Secondary | ICD-10-CM | POA: Diagnosis not present

## 2022-04-26 DIAGNOSIS — E1140 Type 2 diabetes mellitus with diabetic neuropathy, unspecified: Secondary | ICD-10-CM

## 2022-04-26 DIAGNOSIS — Z7982 Long term (current) use of aspirin: Secondary | ICD-10-CM | POA: Diagnosis not present

## 2022-04-26 DIAGNOSIS — Z79899 Other long term (current) drug therapy: Secondary | ICD-10-CM | POA: Insufficient documentation

## 2022-04-26 DIAGNOSIS — N186 End stage renal disease: Secondary | ICD-10-CM

## 2022-04-26 DIAGNOSIS — L8990 Pressure ulcer of unspecified site, unspecified stage: Secondary | ICD-10-CM | POA: Insufficient documentation

## 2022-04-26 DIAGNOSIS — Z79620 Long term (current) use of immunosuppressive biologic: Secondary | ICD-10-CM

## 2022-04-26 DIAGNOSIS — I2510 Atherosclerotic heart disease of native coronary artery without angina pectoris: Secondary | ICD-10-CM | POA: Diagnosis not present

## 2022-04-26 DIAGNOSIS — I132 Hypertensive heart and chronic kidney disease with heart failure and with stage 5 chronic kidney disease, or end stage renal disease: Secondary | ICD-10-CM | POA: Diagnosis not present

## 2022-04-26 DIAGNOSIS — Z794 Long term (current) use of insulin: Secondary | ICD-10-CM | POA: Diagnosis not present

## 2022-04-26 DIAGNOSIS — J9621 Acute and chronic respiratory failure with hypoxia: Secondary | ICD-10-CM

## 2022-04-26 DIAGNOSIS — I3139 Other pericardial effusion (noninflammatory): Secondary | ICD-10-CM

## 2022-04-26 DIAGNOSIS — Z992 Dependence on renal dialysis: Secondary | ICD-10-CM | POA: Diagnosis not present

## 2022-04-26 DIAGNOSIS — E1122 Type 2 diabetes mellitus with diabetic chronic kidney disease: Secondary | ICD-10-CM | POA: Diagnosis not present

## 2022-04-26 LAB — TROPONIN I (HIGH SENSITIVITY)
Troponin I (High Sensitivity): 13 ng/L (ref <18)
Troponin I (High Sensitivity): 27 ng/L — ABNORMAL HIGH (ref <18)
Troponin I (High Sensitivity): 44 ng/L — ABNORMAL HIGH (ref <18)
Troponin I (High Sensitivity): 41 ng/L — ABNORMAL HIGH (ref <18)

## 2022-04-26 LAB — CBC WITH DIFFERENTIAL/PLATELET
Abs Immature Granulocytes: 0.06 10*3/uL (ref 0.00–0.07)
Basophils Absolute: 0 10*3/uL (ref 0.0–0.1)
Basophils Relative: 1 %
Eosinophils Absolute: 0.2 10*3/uL (ref 0.0–0.5)
Eosinophils Relative: 4 %
HCT: 30.4 % — ABNORMAL LOW (ref 36.0–46.0)
Hemoglobin: 9 g/dL — ABNORMAL LOW (ref 12.0–15.0)
Immature Granulocytes: 1 %
Lymphocytes Relative: 23 %
Lymphs Abs: 1.3 10*3/uL (ref 0.7–4.0)
MCH: 23.9 pg — ABNORMAL LOW (ref 26.0–34.0)
MCHC: 29.6 g/dL — ABNORMAL LOW (ref 30.0–36.0)
MCV: 80.6 fL (ref 80.0–100.0)
Monocytes Absolute: 0.7 10*3/uL (ref 0.1–1.0)
Monocytes Relative: 13 %
Neutro Abs: 3.3 10*3/uL (ref 1.7–7.7)
Neutrophils Relative %: 58 %
Platelets: 263 10*3/uL (ref 150–400)
RBC: 3.77 MIL/uL — ABNORMAL LOW (ref 3.87–5.11)
RDW: 23.2 % — ABNORMAL HIGH (ref 11.5–15.5)
WBC: 5.6 10*3/uL (ref 4.0–10.5)
nRBC: 0.5 % — ABNORMAL HIGH (ref 0.0–0.2)

## 2022-04-26 LAB — I-STAT CHEM 8, ED
BUN: 17 mg/dL (ref 8–23)
Calcium, Ion: 1.18 mmol/L (ref 1.15–1.40)
Chloride: 104 mmol/L (ref 98–111)
Creatinine, Ser: 0.7 mg/dL (ref 0.44–1.00)
Glucose, Bld: 306 mg/dL — ABNORMAL HIGH (ref 70–99)
HCT: 31 % — ABNORMAL LOW (ref 36.0–46.0)
Hemoglobin: 10.5 g/dL — ABNORMAL LOW (ref 12.0–15.0)
Potassium: 4.3 mmol/L (ref 3.5–5.1)
Sodium: 137 mmol/L (ref 135–145)
TCO2: 23 mmol/L (ref 22–32)

## 2022-04-26 LAB — I-STAT VENOUS BLOOD GAS, ED
Acid-Base Excess: 1 mmol/L (ref 0.0–2.0)
Bicarbonate: 24.4 mmol/L (ref 20.0–28.0)
Calcium, Ion: 1.09 mmol/L — ABNORMAL LOW (ref 1.15–1.40)
HCT: 29 % — ABNORMAL LOW (ref 36.0–46.0)
Hemoglobin: 9.9 g/dL — ABNORMAL LOW (ref 12.0–15.0)
O2 Saturation: 77 %
Potassium: 4.4 mmol/L (ref 3.5–5.1)
Sodium: 136 mmol/L (ref 135–145)
TCO2: 25 mmol/L (ref 22–32)
pCO2, Ven: 32.9 mmHg — ABNORMAL LOW (ref 44–60)
pH, Ven: 7.478 — ABNORMAL HIGH (ref 7.25–7.43)
pO2, Ven: 38 mmHg (ref 32–45)

## 2022-04-26 LAB — GLUCOSE, CAPILLARY: Glucose-Capillary: 306 mg/dL — ABNORMAL HIGH (ref 70–99)

## 2022-04-26 LAB — RESP PANEL BY RT-PCR (FLU A&B, COVID) ARPGX2
Influenza A by PCR: NEGATIVE
Influenza B by PCR: NEGATIVE
SARS Coronavirus 2 by RT PCR: NEGATIVE

## 2022-04-26 LAB — BASIC METABOLIC PANEL
Anion gap: 9 (ref 5–15)
BUN: 16 mg/dL (ref 8–23)
CO2: 25 mmol/L (ref 22–32)
Calcium: 9.3 mg/dL (ref 8.9–10.3)
Chloride: 104 mmol/L (ref 98–111)
Creatinine, Ser: 0.82 mg/dL (ref 0.44–1.00)
GFR, Estimated: >60 mL/min (ref >60)
Glucose, Bld: 304 mg/dL — ABNORMAL HIGH (ref 70–99)
Potassium: 4.3 mmol/L (ref 3.5–5.1)
Sodium: 138 mmol/L (ref 135–145)

## 2022-04-26 LAB — CBG MONITORING, ED
Glucose-Capillary: 236 mg/dL — ABNORMAL HIGH (ref 70–99)
Glucose-Capillary: 348 mg/dL — ABNORMAL HIGH (ref 70–99)

## 2022-04-26 LAB — ECHOCARDIOGRAM COMPLETE
Area-P 1/2: 4.33 cm2
Height: 62 in
S' Lateral: 1.7 cm
Weight: 3216 oz

## 2022-04-26 LAB — HIV ANTIBODY (ROUTINE TESTING W REFLEX): HIV Screen 4th Generation wRfx: NONREACTIVE

## 2022-04-26 LAB — PROTIME-INR
INR: 1.3 — ABNORMAL HIGH (ref 0.8–1.2)
Prothrombin Time: 16 seconds — ABNORMAL HIGH (ref 11.4–15.2)

## 2022-04-26 LAB — BRAIN NATRIURETIC PEPTIDE: B Natriuretic Peptide: 537.9 pg/mL — ABNORMAL HIGH (ref 0.0–100.0)

## 2022-04-26 LAB — MAGNESIUM: Magnesium: 1.6 mg/dL — ABNORMAL LOW (ref 1.7–2.4)

## 2022-04-26 MED ORDER — SULFAMETHOXAZOLE-TRIMETHOPRIM 400-80 MG PO TABS
1.0000 | ORAL_TABLET | ORAL | Status: DC
  Administered 2022-04-26: 1 via ORAL
  Filled 2022-04-26: qty 1

## 2022-04-26 MED ORDER — CEFDINIR 300 MG PO CAPS
300.0000 mg | ORAL_CAPSULE | Freq: Two times a day (BID) | ORAL | Status: DC
  Administered 2022-04-26 – 2022-04-27 (×3): 300 mg via ORAL
  Filled 2022-04-26 (×4): qty 1

## 2022-04-26 MED ORDER — CYCLOBENZAPRINE HCL 10 MG PO TABS: 10.0000 mg | ORAL_TABLET | Freq: Every evening | ORAL | Status: DC | PRN

## 2022-04-26 MED ORDER — OXYCODONE-ACETAMINOPHEN 5-325 MG PO TABS
1.0000 | ORAL_TABLET | Freq: Four times a day (QID) | ORAL | Status: DC | PRN
  Administered 2022-04-27: 1 via ORAL
  Filled 2022-04-26: qty 1

## 2022-04-26 MED ORDER — ALBUTEROL SULFATE (2.5 MG/3ML) 0.083% IN NEBU
2.5000 mg | INHALATION_SOLUTION | Freq: Four times a day (QID) | RESPIRATORY_TRACT | Status: DC | PRN
Start: 2022-04-26 — End: 2022-04-28

## 2022-04-26 MED ORDER — LEVOTHYROXINE SODIUM 25 MCG PO TABS
125.0000 ug | ORAL_TABLET | Freq: Every day | ORAL | Status: DC
  Administered 2022-04-27: 125 ug via ORAL
  Filled 2022-04-26: qty 1

## 2022-04-26 MED ORDER — INSULIN GLARGINE-YFGN 100 UNIT/ML ~~LOC~~ SOLN
15.0000 [IU] | Freq: Every day | SUBCUTANEOUS | Status: DC
  Administered 2022-04-26: 15 [IU] via SUBCUTANEOUS
  Filled 2022-04-26 (×3): qty 0.15

## 2022-04-26 MED ORDER — FUROSEMIDE 10 MG/ML IJ SOLN
40.0000 mg | Freq: Once | INTRAMUSCULAR | Status: AC
  Administered 2022-04-26: 40 mg via INTRAVENOUS
  Filled 2022-04-26: qty 4

## 2022-04-26 MED ORDER — ASPIRIN 81 MG PO CHEW
81.0000 mg | CHEWABLE_TABLET | Freq: Every day | ORAL | Status: DC
  Administered 2022-04-26 – 2022-04-27 (×2): 81 mg via ORAL
  Filled 2022-04-26 (×2): qty 1

## 2022-04-26 MED ORDER — ACETAMINOPHEN 325 MG PO TABS
650.0000 mg | ORAL_TABLET | Freq: Four times a day (QID) | ORAL | Status: DC | PRN
  Administered 2022-04-27: 650 mg via ORAL
  Filled 2022-04-26: qty 2

## 2022-04-26 MED ORDER — ISOSORBIDE DINITRATE 10 MG PO TABS
20.0000 mg | ORAL_TABLET | Freq: Three times a day (TID) | ORAL | Status: DC
  Administered 2022-04-26 – 2022-04-27 (×4): 20 mg via ORAL
  Filled 2022-04-26 (×4): qty 2

## 2022-04-26 MED ORDER — ICOSAPENT ETHYL 1 G PO CAPS
1.0000 g | ORAL_CAPSULE | Freq: Every day | ORAL | Status: DC
  Administered 2022-04-26 – 2022-04-27 (×2): 1 g via ORAL
  Filled 2022-04-26 (×2): qty 1

## 2022-04-26 MED ORDER — MAGNESIUM SULFATE 2 GM/50ML IV SOLN
2.0000 g | Freq: Once | INTRAVENOUS | Status: AC
  Administered 2022-04-26: 2 g via INTRAVENOUS
  Filled 2022-04-26: qty 50

## 2022-04-26 MED ORDER — APIXABAN 5 MG PO TABS
5.0000 mg | ORAL_TABLET | Freq: Two times a day (BID) | ORAL | Status: DC
  Administered 2022-04-26 – 2022-04-27 (×3): 5 mg via ORAL
  Filled 2022-04-26 (×3): qty 1

## 2022-04-26 MED ORDER — OXYCODONE HCL 5 MG PO TABS
5.0000 mg | ORAL_TABLET | Freq: Four times a day (QID) | ORAL | Status: DC | PRN
  Administered 2022-04-26 – 2022-04-27 (×2): 5 mg via ORAL
  Filled 2022-04-26 (×2): qty 1

## 2022-04-26 MED ORDER — GABAPENTIN 300 MG PO CAPS
300.0000 mg | ORAL_CAPSULE | Freq: Three times a day (TID) | ORAL | Status: DC
  Administered 2022-04-26 – 2022-04-27 (×4): 300 mg via ORAL
  Filled 2022-04-26 (×4): qty 1

## 2022-04-26 MED ORDER — INSULIN ASPART 100 UNIT/ML IJ SOLN
0.0000 [IU] | Freq: Three times a day (TID) | INTRAMUSCULAR | Status: DC
  Administered 2022-04-26: 4 [IU] via SUBCUTANEOUS
  Administered 2022-04-27: 3 [IU] via SUBCUTANEOUS

## 2022-04-26 MED ORDER — OXYCODONE-ACETAMINOPHEN 10-325 MG PO TABS: 1.0000 | ORAL_TABLET | Freq: Four times a day (QID) | ORAL | Status: DC | PRN

## 2022-04-26 MED ORDER — TACROLIMUS ER 1 MG PO TB24
1.0000 mg | ORAL_TABLET | Freq: Every day | ORAL | Status: DC
  Administered 2022-04-27: 1 mg via ORAL
  Filled 2022-04-26 (×2): qty 1

## 2022-04-26 MED ORDER — NITROGLYCERIN 0.4 MG SL SUBL: 0.4000 mg | SUBLINGUAL_TABLET | SUBLINGUAL | Status: DC | PRN

## 2022-04-26 MED ORDER — IOHEXOL 350 MG/ML SOLN
100.0000 mL | Freq: Once | INTRAVENOUS | Status: AC | PRN
  Administered 2022-04-26: 60 mL via INTRAVENOUS

## 2022-04-26 MED ORDER — ACETAMINOPHEN 650 MG RE SUPP: 650.0000 mg | Freq: Four times a day (QID) | RECTAL | Status: DC | PRN

## 2022-04-26 MED ORDER — MYCOPHENOLATE SODIUM 180 MG PO TBEC
360.0000 mg | DELAYED_RELEASE_TABLET | Freq: Every day | ORAL | Status: DC
  Administered 2022-04-26 – 2022-04-27 (×2): 360 mg via ORAL
  Filled 2022-04-26 (×2): qty 2

## 2022-04-26 MED ORDER — PREDNISONE 5 MG PO TABS
5.0000 mg | ORAL_TABLET | Freq: Every day | ORAL | Status: DC
  Administered 2022-04-26 – 2022-04-27 (×2): 5 mg via ORAL
  Filled 2022-04-26 (×2): qty 1

## 2022-04-26 MED ORDER — FAMOTIDINE 20 MG PO TABS
40.0000 mg | ORAL_TABLET | Freq: Every day | ORAL | Status: DC
  Administered 2022-04-26 – 2022-04-27 (×2): 40 mg via ORAL
  Filled 2022-04-26 (×2): qty 2

## 2022-04-26 MED ORDER — NIFEDIPINE ER OSMOTIC RELEASE 60 MG PO TB24
60.0000 mg | ORAL_TABLET | Freq: Every day | ORAL | Status: DC
  Administered 2022-04-27: 60 mg via ORAL
  Filled 2022-04-26: qty 1

## 2022-04-26 NOTE — ED Notes (Signed)
ED TO INPATIENT HANDOFF REPORT  ED Nurse Name and Phone #: Baxter Flattery, RN  S Name/Age/Gender [name] 61 y.o. female Room/Bed: 037C/037C  Code Status   Code Status: Full Code  Home/SNF/Other Home Patient oriented to: self, place, time, and situation Is this baseline? Yes   Triage Complete: Triage complete  Chief Complaint Acute exacerbation of CHF (congestive heart failure) ([place]) [I50.9]  Triage Note CP SOB and initial sats 65% RA and now 95% NRB.  Recent CABG 1 week ago.  Started having CP yesterday and last 1.5 hour has got worse radiating to left flank.  Took 324 ASA pta and has received 2 nitro which has helped chest pain. NSR on 12 lead.    Allergies Allergies  Allergen Reactions   Lisinopril Swelling    Swelling of tongue   Hydralazine     Other reaction(s): heart palpitations    Level of Care/Admitting Diagnosis ED Disposition     ED Disposition  Admit   Condition  --   Comment  Hospital Area: [place] [[id number]]  Level of Care: Telemetry Medical [[id number]]  May place patient in observation at [facility] or [place] if equivalent level of care is available:: No  Covid Evaluation: Confirmed COVID Negative  Diagnosis: Acute exacerbation of CHF (congestive heart failure) [facility]) [[id number]]  Admitting Physician: Sid Falcon [[id number]]  Attending Physician: Cresenciano Lick          B Medical/Surgery History Past Medical History:  Diagnosis Date   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 On multiple medications.   Gastroparesis due to DM [facility])    GERD (gastroesophageal reflux disease)    Heart murmur    Hemodialysis patient [facility])     Hyperlipidemia    Hypothyroidism (acquired)    Paroxysmal supraventricular tachycardia ([place])    Peripheral arterial disease [facility]) December 2014   @ [place]: a) RSFA PTA (01/2013); b) L SFA [place] Atherectomy/PTA with 3V runoff; LEA Dopplers January 2016 [facility] post L SFA PTA): Mild, insignificant disease in the left CFA, profunda, SFA, popliteal artery and tibioperoneal trunk.   Sickle cell trait [facility])    Past Surgical History:  Procedure Laterality Date   ABDOMINAL HYSTERECTOMY     ARTERIOVENOUS GRAFT PLACEMENT Left 2012   Dr. Harrington Challenger  2nd graft   Carotid ultrasound   May 2011   Normal ([place], [place])   CATARACT EXTRACTION, BILATERAL     COLONOSCOPY  07/19/2012   Procedure: COLONOSCOPY;  Surgeon: Beryle Beams, MD;  Location: [place];  Service: Endoscopy;  Laterality: N/A;   FISTULOGRAM Left 06/04/2015   Procedure: FISTULOGRAM;  Surgeon: Conrad Winona, MD;  Location: [place];  Service: Vascular;  Laterality: Left;   KNEE ARTHROPLASTY     KNEE SURGERY     right   NM MYOVIEW LTD  September 2009; 01/15/2015   a) @ [place] [facility] - Negative for inducible or reversible ischemia with pharmacologic stress. EF 63% ; b) [facility]: EF 68%. No RWMA, negative S4 reversible coronary ischemia or infarction.   PERIPHERAL ANGIOPLASTY  May 2014   PTA of R SFA-PopA.  PERIPHERAL ATHRECTOMY  December 2014   [place] Atherectomy of L SFA-L PopA reduction of 60% to 10% stenosis    PERIPHERAL VASCULAR CATHETERIZATION  May 2014; December 2014   a) RSFA & PopA mod-severe disease -- R SFA PTA, Med Rx of L SFA; b) distal abdominal and iliac & CFA arteries patent.  m-d LSFA long 60%, LPop 60% -> 3 V runoff. RSFA ~40% mid, RPop 60%, 3V runoff   REVISION OF ARTERIOVENOUS GORETEX GRAFT Left 06/04/2015   Procedure: REVISION OF ARTERIOVENOUS GORETEX GRAFT;  Surgeon: Conrad [place], MD;  Location: [place];  Service: Vascular;  Laterality: Left;   SHUNTOGRAM Left  02/14/2013   Procedure: FISTULOGRAM;  Surgeon: Serafina Mitchell, MD;  Location: [facility] CATH LAB;  Service: Cardiovascular;  Laterality: Left;   THYROID SURGERY     THYROIDECTOMY     TRANSTHORACIC ECHOCARDIOGRAM  October 2013; January 2015   a) [name]) 2013: normal LV size and function. Mild LVH. EF 55-60%. no RWMA, No valve disease; b) [place] 1/'15: EF 70-75%, mild MR. Normal diastolic dysfunction     A IV Location/Drains/Wounds Patient Lines/Drains/Airways Status     Active Line/Drains/Airways     Name Placement date Placement time Site Days   Peripheral IV 04/26/22 22 G Anterior;Right Hand 04/26/22  0453  Hand  less than 1   Peripheral IV 04/26/22 20 G Anterior;Proximal;Right Forearm 04/26/22  0742  Forearm  less than 1   Fistula / Graft Left Arteriovenous vein graft 06/04/13  --  --  3248            Intake/Output Last 24 hours  Intake/Output Summary (Last 24 hours) at 04/26/2022 1643 Last data filed at 04/26/2022 1543 Gross per 24 hour  Intake --  Output 2100 ml  Net -2100 ml    Labs/Imaging Results for orders placed or performed during the hospital encounter of 04/26/22 (from the past 48 hour(s))  CBC with Differential     Status: Abnormal   Collection Time: 04/26/22  5:09 AM  Result Value Ref Range   WBC 5.6 4.0 - 10.5 K/uL   RBC 3.77 (L) 3.87 - 5.11 MIL/uL   Hemoglobin 9.0 (L) 12.0 - 15.0 g/dL   HCT 30.4 (L) 36.0 - 46.0 %   MCV 80.6 80.0 - 100.0 fL   MCH 23.9 (L) 26.0 - 34.0 pg   MCHC 29.6 (L) 30.0 - 36.0 g/dL   RDW 23.2 (H) 11.5 - 15.5 %   Platelets 263 150 - 400 K/uL   nRBC 0.5 (H) 0.0 - 0.2 %   Neutrophils Relative % 58 %   Neutro Abs 3.3 1.7 - 7.7 K/uL   Lymphocytes Relative 23 %   Lymphs Abs 1.3 0.7 - 4.0 K/uL   Monocytes Relative 13 %   Monocytes Absolute 0.7 0.1 - 1.0 K/uL   Eosinophils Relative 4 %   Eosinophils Absolute 0.2 0.0 - 0.5 K/uL   Basophils Relative 1 %   Basophils Absolute 0.0 0.0 - 0.1 K/uL   Immature Granulocytes 1 %   Abs  Immature Granulocytes 0.06 0.00 - 0.07 K/uL    Comment: Performed at [facility], [address] [place], [place] [address]  Troponin I (High Sensitivity)     Status: None   Collection Time: 04/26/22  5:09 AM  Result Value Ref Range   Troponin I (High Sensitivity) 13 <18 ng/L    Comment: (NOTE) Elevated high sensitivity troponin I (hsTnI) values and significant  changes  across serial measurements may suggest ACS but many other  chronic and acute conditions are known to elevate hsTnI results.  Refer to the "Links" section for chest pain algorithms and additional  guidance. Performed at [facility], [place] [address] [place], [place] [address]   Brain natriuretic peptide     Status: Abnormal   Collection Time: 04/26/22  5:09 AM  Result Value Ref Range   B Natriuretic Peptide 537.9 (H) 0.0 - 100.0 pg/mL    Comment: Performed at [facility] [address], [place], [place] [address]  Basic metabolic panel     Status: Abnormal   Collection Time: 04/26/22  5:09 AM  Result Value Ref Range   Sodium 138 135 - 145 mmol/L   Potassium 4.3 3.5 - 5.1 mmol/L   Chloride 104 98 - 111 mmol/L   CO2 25 22 - 32 mmol/L   Glucose, Bld 304 (H) 70 - 99 mg/dL    Comment: Glucose reference range applies only to samples taken after fasting for at least 8 hours.   BUN 16 8 - 23 mg/dL   Creatinine, Ser 0.82 0.44 - 1.00 mg/dL   Calcium 9.3 8.9 - 10.3 mg/dL   GFR, Estimated >60 >60 mL/min    Comment: (NOTE) Calculated using the CKD-EPI Creatinine Equation (2021)    Anion gap 9 5 - 15    Comment: Performed at [facility] [address], [place], [place] [address]  Protime-INR     Status: Abnormal   Collection Time: 04/26/22  5:09 AM  Result Value Ref Range   Prothrombin Time 16.0 (H) 11.4 - 15.2 seconds   INR 1.3 (H) 0.8 - 1.2    Comment: (NOTE) INR goal varies based on device and disease states. Performed at [facility], [place] [address], [place], [place] [address]    Resp Panel by RT-PCR (Flu A&B, Covid) Anterior Nasal Swab     Status: None   Collection Time: 04/26/22  5:41 AM   Specimen: Anterior Nasal Swab  Result Value Ref Range   SARS Coronavirus 2 by RT PCR NEGATIVE NEGATIVE    Comment: (NOTE) SARS-CoV-2 target nucleic acids are NOT DETECTED.  The SARS-CoV-2 RNA is generally detectable in upper respiratory specimens during the acute phase of infection. The lowest concentration of SARS-CoV-2 viral copies this assay can detect is 138 copies/mL. A negative result does not preclude SARS-Cov-2 infection and should not be used as the sole basis for treatment or other patient management decisions. A negative result may occur with  improper specimen collection/handling, submission of specimen other than nasopharyngeal swab, presence of viral mutation(s) within the areas targeted by this assay, and inadequate number of viral copies(<138 copies/mL). A negative result must be combined with clinical observations, patient history, and epidemiological information. The expected result is Negative.  Fact Sheet for Patients:  EntrepreneurPulse.com.au  Fact Sheet for Healthcare Providers:  IncredibleEmployment.be  This test is no t yet approved or cleared by the [place] FDA and  has been authorized for detection and/or diagnosis of SARS-CoV-2 by FDA under an Emergency Use Authorization (EUA). This EUA will remain  in effect (meaning this test can be used) for the duration of the COVID-19 declaration under Section 564(b)(1) of the Act, 21 U.S.C.section 360bbb-3(b)(1), unless the authorization is terminated  or revoked sooner.       Influenza A by PCR NEGATIVE NEGATIVE   Influenza B by PCR NEGATIVE NEGATIVE    Comment: (NOTE)  The Xpert Xpress SARS-CoV-2/FLU/RSV plus assay is intended as an aid in the diagnosis of influenza from Nasopharyngeal swab specimens and should not be used as a sole basis for treatment.  Nasal washings and aspirates are unacceptable for Xpert Xpress SARS-CoV-2/FLU/RSV testing.  Fact Sheet for Patients: EntrepreneurPulse.com.au  Fact Sheet for Healthcare Providers: IncredibleEmployment.be  This test is not yet approved or cleared by the [place] FDA and has been authorized for detection and/or diagnosis of SARS-CoV-2 by FDA under an Emergency Use Authorization (EUA). This EUA will remain in effect (meaning this test can be used) for the duration of the COVID-19 declaration under Section 564(b)(1) of the Act, 21 U.S.C. section 360bbb-3(b)(1), unless the authorization is terminated or revoked.  Performed at [facility], [place] [address] [place], [place] [address]   I-stat chem 8, ED     Status: Abnormal   Collection Time: 04/26/22  5:50 AM  Result Value Ref Range   Sodium 137 135 - 145 mmol/L   Potassium 4.3 3.5 - 5.1 mmol/L   Chloride 104 98 - 111 mmol/L   BUN 17 8 - 23 mg/dL   Creatinine, Ser 0.70 0.44 - 1.00 mg/dL   Glucose, Bld 306 (H) 70 - 99 mg/dL    Comment: Glucose reference range applies only to samples taken after fasting for at least 8 hours.   Calcium, Ion 1.18 1.15 - 1.40 mmol/L   TCO2 23 22 - 32 mmol/L   Hemoglobin 10.5 (L) 12.0 - 15.0 g/dL   HCT 31.0 (L) 36.0 - 46.0 %  Troponin I (High Sensitivity)     Status: Abnormal   Collection Time: 04/26/22  8:05 AM  Result Value Ref Range   Troponin I (High Sensitivity) 27 (H) <18 ng/L    Comment: (NOTE) Elevated high sensitivity troponin I (hsTnI) values and significant  changes across serial measurements may suggest ACS but many other  chronic and acute conditions are known to elevate hsTnI results.  Refer to the "Links" section for chest pain algorithms and additional  guidance. Performed at [facility], [place] [address], [place], [place] [address]   I-Stat venous blood gas, [facility] ED only)     Status: Abnormal   Collection Time: 04/26/22  8:12 AM   Result Value Ref Range   pH, Ven 7.478 (H) 7.25 - 7.43   pCO2, Ven 32.9 (L) 44 - 60 mmHg   pO2, Ven 38 32 - 45 mmHg   Bicarbonate 24.4 20.0 - 28.0 mmol/L   TCO2 25 22 - 32 mmol/L   O2 Saturation 77 %   Acid-Base Excess 1.0 0.0 - 2.0 mmol/L   Sodium 136 135 - 145 mmol/L   Potassium 4.4 3.5 - 5.1 mmol/L   Calcium, Ion 1.09 (L) 1.15 - 1.40 mmol/L   HCT 29.0 (L) 36.0 - 46.0 %   Hemoglobin 9.9 (L) 12.0 - 15.0 g/dL   Sample type VENOUS    Comment NOTIFIED PHYSICIAN   CBG monitoring, ED     Status: Abnormal   Collection Time: 04/26/22  1:05 PM  Result Value Ref Range   Glucose-Capillary 236 (H) 70 - 99 mg/dL    Comment: Glucose reference range applies only to samples taken after fasting for at least 8 hours.  Troponin I (High Sensitivity)     Status: Abnormal   Collection Time: 04/26/22  1:39 PM  Result Value Ref Range   Troponin I (High Sensitivity) 44 (H) <18 ng/L    Comment: (NOTE) Elevated  high sensitivity troponin I (hsTnI) values and significant  changes across serial measurements may suggest ACS but many other  chronic and acute conditions are known to elevate hsTnI results.  Refer to the "Links" section for chest pain algorithms and additional  guidance. Performed at [facility], [place] [address], [place], [place] [address]   Magnesium     Status: Abnormal   Collection Time: 04/26/22  1:39 PM  Result Value Ref Range   Magnesium 1.6 (L) 1.7 - 2.4 mg/dL    Comment: Performed at [facility] [address] [place], [place] [address]   CT Angio Chest PE W and/or Wo Contrast  Result Date: 04/26/2022 CLINICAL DATA:  Chest pain, shortness of breath EXAM: CT ANGIOGRAPHY CHEST WITH CONTRAST TECHNIQUE: Multidetector CT imaging of the chest was performed using the standard protocol during bolus administration of intravenous contrast. Multiplanar CT image reconstructions and MIPs were obtained to evaluate the vascular anatomy. RADIATION DOSE REDUCTION: This exam was  performed according to the departmental dose-optimization program which includes automated exposure control, adjustment of the mA and/or kV according to patient size and/or use of iterative reconstruction technique. CONTRAST:  68mL OMNIPAQUE IOHEXOL 350 MG/ML SOLN COMPARISON:  Chest radiographs done on 04/26/2022, CT done on 06/10/2015 FINDINGS: Cardiovascular: Heart is enlarged in size. Extensive coronary artery calcifications are seen. There is homogeneous enhancement in thoracic aorta. There are no intraluminal filling defects in central pulmonary artery branches. Evaluation of small peripheral branches is limited by motion artifacts and infiltrates. Small pericardial effusion is present. Mediastinum/Nodes: No significant lymphadenopathy is seen in mediastinum. Surgical clips are seen in thyroid bed suggesting previous resection. Lungs/Pleura: Central pulmonary vessels are prominent. There are ground-glass densities in both lungs. There are linear patchy densities in both mid and lower lung fields. Small bilateral pleural effusions are seen, more so on the left side. There is no pneumothorax. Upper Abdomen: Unremarkable. Musculoskeletal: Metallic sutures are seen in the sternum, possibly related to coronary bypass surgery. Review of the MIP images confirms the above findings. IMPRESSION: There is no evidence of central pulmonary artery embolism. There is no evidence of thoracic aortic dissection. Increased markings in both lungs suggest CHF with pulmonary edema. Small bilateral pleural effusions. Small pericardial effusion. Extensive coronary artery calcifications are seen. Other findings as described in the body of the report. Electronically Signed   By: Elmer Picker M.D.   On: 04/26/2022 09:20   DG Chest Portable 1 View  Result Date: 04/26/2022 CLINICAL DATA:  Shortness of breath.  Status post CABG 1 week ago. EXAM: PORTABLE CHEST 1 VIEW COMPARISON:  03/27/2016 FINDINGS: 0534 hours. Low volume film.  The cardio pericardial silhouette is enlarged. Vascular congestion with probable underlying interstitial pulmonary edema. Bibasilar atelectasis without substantial pleural effusion. IMPRESSION: Cardiomegaly with probable interstitial edema. Bibasilar atelectasis. Electronically Signed   By: Misty Stanley M.D.   On: 04/26/2022 05:42    Pending Labs Unresulted Labs (From admission, onward)     Start     Ordered   04/27/22 0500  Comprehensive metabolic panel  Tomorrow morning,   R        04/26/22 1145   04/27/22 0500  CBC  Tomorrow morning,   R        04/26/22 1145   04/27/22 0500  Magnesium  Tomorrow morning,   R        04/26/22 1437   04/26/22 1420  HIV Antibody (routine testing w rflx)  Once,   R  04/26/22 1420            Vitals/Pain Today's Vitals   04/26/22 1300 04/26/22 1315 04/26/22 1330 04/26/22 1521  BP: (!) 148/51 (!) 147/49 (!) 140/53 117/65  Pulse: 94 87 85 90  Resp: (!) 21 (!) 21 (!) 22 18  Temp:    98.8 F (37.1 C)  TempSrc:    Oral  SpO2: 93% 92% 93% 95%  Weight:      Height:      PainSc:        Isolation Precautions No active isolations  Medications Medications  acetaminophen (TYLENOL) tablet 650 mg (has no administration in time range)    Or  acetaminophen (TYLENOL) suppository 650 mg (has no administration in time range)  apixaban (ELIQUIS) tablet 5 mg (5 mg Oral Given 04/26/22 1302)  aspirin chewable tablet 81 mg (81 mg Oral Given 04/26/22 1302)  cefdinir (OMNICEF) capsule 300 mg (300 mg Oral Given 04/26/22 1325)  gabapentin (NEURONTIN) capsule 300 mg (300 mg Oral Given 04/26/22 1630)  icosapent Ethyl (VASCEPA) 1 g capsule 1 g (1 g Oral Given 04/26/22 1302)  isosorbide dinitrate (ISORDIL) tablet 20 mg (20 mg Oral Given 04/26/22 1630)  levothyroxine (SYNTHROID) tablet 125 mcg (has no administration in time range)  mycophenolate (MYFORTIC) EC tablet 360 mg (360 mg Oral Given 04/26/22 1322)  nitroGLYCERIN (NITROSTAT) SL tablet 0.4 mg (has no administration in  time range)  predniSONE (DELTASONE) tablet 5 mg (5 mg Oral Given 04/26/22 1302)  sulfamethoxazole-trimethoprim (BACTRIM) 400-80 MG per tablet 1 tablet (1 tablet Oral Given 04/26/22 1322)  tacrolimus ER (ENVARSUS XR) tablet 1 mg (has no administration in time range)  NIFEdipine (PROCARDIA XL/NIFEDICAL XL) 24 hr tablet 60 mg (has no administration in time range)  insulin aspart (novoLOG) injection 0-6 Units (has no administration in time range)  insulin glargine-yfgn (SEMGLEE) injection 15 Units (has no administration in time range)  famotidine (PEPCID) tablet 40 mg (40 mg Oral Given 04/26/22 1630)  albuterol (PROVENTIL) (2.5 MG/3ML) 0.083% nebulizer solution 2.5 mg (has no administration in time range)  magnesium sulfate IVPB 2 g 50 mL (has no administration in time range)  iohexol (OMNIPAQUE) 350 MG/ML injection 100 mL (60 mLs Intravenous Contrast Given 04/26/22 0902)  furosemide (LASIX) injection 40 mg (40 mg Intravenous Given 04/26/22 0939)  furosemide (LASIX) injection 40 mg (40 mg Intravenous Given 04/26/22 1539)    Mobility walks Low fall risk   Focused Assessments Cardiac Assessment Handoff:  Cardiac Rhythm: Normal sinus rhythm Lab Results  Component Value Date   CKTOTAL 33 07/16/2012   CKMB 4.3 (H) 07/12/2012   TROPONINI <0.03 03/29/2016   No results found for: "DDIMER" Does the Patient currently have chest pain? No    R Recommendations: See Admitting Provider Note  Report given to:   Additional Notes:

## 2022-04-26 NOTE — H&P (Cosign Needed Addendum)
Date: 04/26/2022               Patient Name:  [name] MRN: [id number]  DOB: 12-22-1960 Age / Sex: 61 y.o., female   PCP: Karina Hey, MD         Medical Service: Internal Medicine Teaching Service         Attending Physician: Dr. Gilles Chiquito, MD    First Contact: Dr. Vena Rua  Pager: [phone]  Second Contact: Dr. Sanjuana Letters  Pager: [phone]       After Hours (After 5p/  First Contact Pager: [phone]  weekends / holidays): Second Contact Pager: [phone]   Chief Complaint: Chest pain and shortness of breath  History of Present Illness:  [name] is a 61 year old person living with  CAD s/p CABG on 03/16/2022 at [facility], chronic respiratory failure 2/2 COVID pneumonia in 2020 on chronic 3 L [place], ESRD s/p kidney transplant on immunosuppression, HFpEF, prior DVT on Eliquis, T2DM, and HLD who presents today for chest pain and shortness of breath that started yesterday night. Patient recently discharged from [name] after elective CABG on 03/16/2022 by [name] complicated by hypoxia 2/2 to pulmonary edema, UTI E. Faecalis treated with Zosyn, and hypoglycemia secondary to poor PO intake. She was discharged to  impatient rehab on 04/15/2022 and home on 04/23/2022. She has been living in [name] with her son since discharge and was doing well until yesterday night around 11pm. She had just completed her albuterol nebulizer and was laying in bed when she noticed dull mid sternal chest pain, which she reports has been intermittent since her CABG. She attributed pain to increased physical activity since discharge home. She took a Civil engineer, contracting without improvement. Pain then radiated under her left breast and sharp. She also endorsed associated shortness of breath. Since pain had not improved in a few hours, she had her son call EMS and took 4 tablets of 81 mg ASA. On arrival by EMS noted she was hypoxic and started on high flow O2 which  improved her symptoms. She also was given nitroglycerin which resolved her chest pain.  In ED patient reports no further chest pain since she took nitro. She also notes breathing improved with 8L [place] and lasix. She notes she felt similarly during last hospitalization when she had fluid on her lungs. She was discharged on lasix 20 mg daily. Reports PCP told her to take every other day due to difficulty getting to the bathroom. Has been using a bedside commode at home as this has helped. No decrease in urine output at home. She endorses increased LE edema since CABG. Right leg worse than left due to vein harvesting for CABG. This has been stable since discharge home. She always sleeps on many pillows but denies orthopnea. Has not weighed herself since being home but feels she has lost weight as she did not eat much while in the [place].  Since discharge she feels she has been doing well up until last night. Has been able to ambulate with frequent breaks with a walker. Does feels she is getting stronger and appetite is improving. She drinks plenty of water to prevent dehydration and kidney injury. She is having BM every other day. She wears 3L [place] at home after she had COVID in 2020, increases this per her pulmonologist to 4L [name] with activity. Rarely uses albuterol as needed, reports yesterday was first time since discharge. She  denies fever, chills, cough, sputum production, current chest pain, pleuritic pain, diaphoresis, palpitations, n/v/d, lightheadedness, dizziness, or dysuria.    Meds:  ASA 81 mg daily Famotidine  Gabapentin 200 mg three times daily Mycophenolate 360 twice daily Tacrolimus ER 6 mg daily Apixaban 5 mg twice daily Ceftin Flexeril PRN Febuxostat 40 mg daily Ferrous sulfate Folic acid Furosemide 20 mg daily takes every other day last dose 8/5 Icosapent ethyl 2g twice daily Insulin aspart SS TID Glargine 15 units QHS Isosorbide 20 mg three times daily Levothyroxine 100 mcg  daily Linzess Metoprolol XL 100mg  daily (not taking per patient) Nifedipine XL 60 mh twice daily Nitroglycerin SL tablet PRN chest pain Percocet 7.5-325 TID PRN chronic neuropathic pain Prednisone 5 mg daily sulfamethoxazole-trimethoprim 800-160 mg TID Ventolin PRN  Allergies: Allergies as of 04/26/2022 - Review Complete 04/26/2022  Allergen Reaction Noted   Lisinopril Swelling 01/26/2022   Hydralazine  06/10/2020   Past Medical History:  Diagnosis Date   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 On multiple medications.   Gastroparesis due to DM [facility])    GERD (gastroesophageal reflux disease)    Heart murmur    Hemodialysis patient [facility])    Hyperlipidemia    Hypothyroidism (acquired)    Paroxysmal supraventricular tachycardia ([place])    Peripheral arterial disease [facility]) December 2014   @ [place]: a) RSFA PTA (01/2013); b) L SFA [place] Atherectomy/PTA with 3V runoff; LEA Dopplers January 2016 [facility] post L SFA PTA): Mild, insignificant disease in the left CFA, profunda, SFA, popliteal artery and tibioperoneal trunk.   Sickle cell trait (HCC)     Family History: DM (Mother, sisters), HTN (mother, sisters), MI (mother), CVA(sister), Carcinoid cancer (father)  Social History: Prior to CABG lived alone in [name], [place] independent of ADLs and iADLs. Now living with son in [name] while recovering. Ambulates with a walker due to hip arthritis and decondition since recent surgery. Former smoker (~5 cigarettes a day for 15 years) quit 30 years ago, denies alcohol use or illicit drug use. PCP Raechel Chute NP [facility])  Review of  Systems: A complete ROS was negative except as per HPI.   Physical Exam: Blood pressure (!) 140/50, pulse 88, temperature 98 F (36.7 C), temperature source Oral, resp. rate (!) 25, height 5\' 2"  (1.575 m), weight 91.2 kg, SpO2 (!) 89 %. Constitutional: Patient sitting up in bed in no acute distress, appears tired HENT: Normocephalic and atraumatic, EOMI, conjunctiva normal, moist mucous membranes Cardiovascular: Normal rate, regular rhythm, S1 and S2 present, no murmurs, rubs, gallops. JVD at 45 degrees to mid neck, extremities warm. 2+ pitting edema, LUE fistula with bruit and thrill Respiratory: Bibasilar crackles, no wheezing, mild tachypnea with exertion, no accessory muscle use, Saturating in low 90s on 8L  GI: Nondistended, soft, nontender to palpation, normal active bowel sounds Musculoskeletal: Normal bulk and tone. Neurological: Is alert and oriented x4, no apparent focal deficits noted. Skin: Surgical wound from recent CABG healing well. TTP  at surgical sites. No skin breakdown from LE edema Psychiatric: Normal mood and affect. Behavior is normal. Judgment and thought content normal.   EKG: personally reviewed my interpretation is HR 92,  NSR, no acute ischemic changes  CXR: personally reviewed my interpretation is cardiomegaly, vascular congestion with bibasilar atelectasis, no pleural effusion   CT Angio Chest PE W and/or Wo Contrast  Result Date: 04/26/2022 CLINICAL DATA:  Chest pain, shortness of breath EXAM: CT ANGIOGRAPHY CHEST WITH CONTRAST TECHNIQUE: Multidetector CT imaging of the chest was performed using the standard protocol during bolus administration of intravenous contrast. Multiplanar CT image reconstructions and MIPs were obtained to evaluate the vascular anatomy.  COMPARISON:  Chest radiographs done on 04/26/2022, CT done on 06/10/2015 FINDINGS: Cardiovascular: Heart is enlarged in size. Extensive coronary artery calcifications are seen. There is homogeneous  enhancement in thoracic aorta. There are no intraluminal filling defects in central pulmonary artery branches. Evaluation of small peripheral branches is limited by motion artifacts and infiltrates. Small pericardial effusion is present. Mediastinum/Nodes: No significant lymphadenopathy is seen in mediastinum. Surgical clips are seen in thyroid bed suggesting previous resection. Lungs/Pleura: Central pulmonary vessels are prominent. There are ground-glass densities in both lungs. There are linear patchy densities in both mid and lower lung fields. Small bilateral pleural effusions are seen, more so on the left side. There is no pneumothorax. Upper Abdomen: Unremarkable. Musculoskeletal: Metallic sutures are seen in the sternum, possibly related to coronary bypass surgery. Review of the MIP images confirms the above findings. IMPRESSION:  There is no evidence of central pulmonary artery embolism. There is no evidence of thoracic aortic dissection. Increased markings in both lungs suggest CHF with pulmonary edema. Small bilateral pleural effusions. Small pericardial effusion. Extensive coronary artery calcifications are seen. Other findings as described in the body of the report. Electronically Signed   By: Elmer Picker M.D.   On: 04/26/2022 09:20    Assessment & Plan by Problem: Principal Problem:   Acute exacerbation of CHF (congestive heart failure) (HCC)  Acute on chronic hypoxic respiratory failure due to HFpEF exacerbation Patient presenting with worsening chest pain and dyspnea overnight that has resolved nitroglycerin and O2.  Last TTE 01/28/2022 unable to view study but last EF of 55% per chart review. Recent admission for elective CABG in June prolonged by hypoxia and respiratory distress improved with diuresis. On 8L [place] with bibasilar crackles, JVD, and LE edema. Weight 85kg on 8/4 and 91 kg on admission. BNP elevated 537. CT PR w/o PE c/w pulmonary edema and small bilateral pleural  effusion.Troponin 13>27 likely demand. EKG NSR.HF exacerbation likely insetting of medication non adherence. Diuresing well IV lasix with 1L output.  -Lasix 40 mg IV -Hold metoprolol XL (patient reports she is not on this) -Echocardiogram -Monitor BMP, K, and Mg -Monitor I/O, daily weights -Telemetry -O2 to maintain saturations >92%, wean as tolerated   CAD s/p CABG  HLD HTN CABG x 3 (LIMA to LAD, SVG to OM, SVG to PDA) on 03/16/2022. Discharged after completing AIR in [facility] 04/23/2022. Atypical chest pain yesterday while laying in bed, reproducible on palpation today, however did improve with nitroglycerin. Troponin 13>27. EKG without acute ischemic changes. Suspect CP MSK in setting of recent CABG. Statin stopped at last admission due to transminitis.  -Tylenol PRN pain -ASA 81 mg daily -continue isordil 20 mg 3 times daily, nifedipine 60 mg daily, icosapent -nitroglycerin PRN -Trend troponin -f/u CMP -Telemetry -PT/OT  Pericardial Effusion Small pericardial effusion noted CT PE study, likely in setting of recent cardiac surgery. Seems c/w finding on CT follow CABG at last admission. Pericarditis less likely as CP has resolved, no characteristic EKG finding, friction rub. -f/u echo  ESRD  s/p renal transplant on immunosuppression ESRD due to diabetic renal disease. BUN 16 and Cr 0.82. -Continue tacrolimus, mycophenolate, bactrim, and prednisone  -CTM renal function  History of recurrent UTI Reports history of recurrent UTI since renal transplant. Treated with IV zosyn 10 days at most recent admission for E Faecalis. Repeat UC with Klebsiella pneumoniae on cefuroxime 500mg  twice daily for 7 days, last day 8/10. Denies any urinary symptoms currently.  -cefdinir to complete 7 days -monitor for urinary symptoms  Chronic hypoxic respiratory failure 2/2 to COVID pneumonia Chronically on 3L [place] and 4L with exertion at home since COVID pneumonia in 2020. Also has albuterol PRN at  home. Reports used 1 neb treatment yesterday, but has not needed it much since discharge. VBG in ED reassuring. -Continue humidified O2 -albuterol Prn -IS   T2DM on insulin with neuropathy A1c 6.7% on 03/10/2022. Home regimen with glargine 15 units and SSI. Hypoglycemic events at last admission d/t poor po intake -SSI very sensitive -Semglee 15 units QHS -CBG monitoring -Gabapentin 300 mg TID, hold percocet in setting of respiratory failure  History of DVT On eliquis 5 mg twice daily. RLE edema > LLE likely in setting recent CABG.  -US doppler RLE  Anemia of chronic renal disease/chronic inflammation Required pRBCx2 after CABG. Hgb stable at 9 -monitor CBC  Hypothyroidism -levothyroxine 125 mcg daily  Dispo: Admit patient to Observation with expected length of stay less than 2 midnights.  Signed: Iona Beard, MD 04/26/2022, 12:56 PM  Pager: [phone] After 5pm on weekdays and 1pm on weekends: On Call pager: [phone]

## 2022-04-26 NOTE — ED Provider Notes (Signed)
[facility] EMERGENCY DEPARTMENT Provider Note   CSN: [id number] Arrival date & time: 04/26/22  0450     History  Chief Complaint  Patient presents with   Chest Pain   Shortness of Breath    [name] is a 61 y.o. female.  HPI  Patient with medical history including hypertension, status post renal transplant currently on immunosuppressive's, CAD status post three-vessel CABG 06/27 by Dr. Windy Carina, chronic respiratory failure on 3 L, DVT on Eliquis presents with complaints of chest pain.  states chest pain started last night came on suddenly, describes a pressure-like sensation which went from the middle of her chest into her left chest, pain remained constant, became short of breath, denies pleuritic chest pain or sharp pain radiating to her back, no lightheaded dizziness or diaphoresis.  States that she took an albuterol inhaler without much relief, she called EMS was given 2 nitros and aspirin states her chest pain has resolved but she still feels short of breath.  She has a history of DVTs has been compliant with her Eliquis, no URI-like symptoms no worsening leg swelling calf pain she is having no other complaints.  Review patient's chart she is a resident in [place], she came to [place] on Friday and has been staying with her son for recovery, she recently had triple bypass surgery at [place] [facility], her care was prolonged due to complicated UTI.    Home Medications Prior to Admission medications   Medication Sig Start Date End Date Taking? Authorizing Provider  aspirin 81 MG chewable tablet Chew 81 mg by mouth daily.    [provider]  benzonatate (TESSALON) 200 MG capsule Take 1 capsule (200 mg total) by mouth 2 (two) times daily as needed for cough. 03/31/16   Theodis Blaze, MD  cloNIDine (CATAPRES) 0.2 MG tablet Take 0.2 mg by mouth 2 (two) times daily.     [provider]  diphenhydrAMINE (BENADRYL) 25 mg capsule Take 25 mg by  mouth every 6 (six) hours as needed for allergies.    [provider]  esomeprazole (NEXIUM) 40 MG capsule Take 40 mg by mouth daily before breakfast.    [provider]  Insulin Lispro Prot & Lispro (HUMALOG MIX 75/25 KWIKPEN) (75-25) 100 UNIT/ML Kwikpen Inject 20-30 Units into the skin 2 (two) times daily with a meal. 30 units in the morning and 0-20 units in the evening (depending on BGL) 03/31/16   Theodis Blaze, MD  levothyroxine (SYNTHROID, LEVOTHROID) 125 MCG tablet Take 125 mcg by mouth daily before breakfast.     [provider]  methocarbamol (ROBAXIN) 500 MG tablet Take 1 tablet (500 mg total) by mouth at bedtime as needed for muscle spasms. 01/17/18   Caccavale, Sophia, PA-C  metoprolol succinate (TOPROL-XL) 50 MG 24 hr tablet Take 1 tablet (50 mg total) by mouth daily. Take with or immediately following a meal. 03/31/16   Theodis Blaze, MD  naproxen (NAPROSYN) 500 MG tablet Take 1 tablet (500 mg total) by mouth 2 (two) times daily with a meal. 01/17/18   Caccavale, Sophia, PA-C  NIFEdipine (PROCARDIA-XL/ADALAT CC) 60 MG 24 hr tablet Take 1 tablet (60 mg total) by mouth daily. 03/31/16   Theodis Blaze, MD  oxyCODONE-acetaminophen (PERCOCET) 10-325 MG tablet Take 1 tablet by mouth every 6 (six) hours as needed for pain. 03/31/16   Theodis Blaze, MD  pravastatin (PRAVACHOL) 40 MG tablet Take 40 mg by mouth at bedtime.  [provider]  pregabalin (LYRICA) 75 MG capsule Take 75 mg by mouth 2 (two) times daily.    [provider]  promethazine (PHENERGAN) 25 MG tablet Take 25 mg by mouth every 8 (eight) hours as needed for nausea or vomiting.     [provider]  sevelamer carbonate (RENVELA) 800 MG tablet Take 800-1,600 mg by mouth 3 (three) times daily with meals. Take 2 tablets (1600 mg) by mouth with meals, and 1 tablet (800 mg) with snacks    [provider]      Allergies    Lisinopril and Hydralazine    Review of Systems    Review of Systems  Constitutional:  Negative for chills and fever.  Respiratory:  Positive for shortness of breath.   Cardiovascular:  Positive for chest pain.  Gastrointestinal:  Negative for abdominal pain.  Neurological:  Negative for headaches.    Physical Exam Updated Vital Signs BP (!) 120/53   Pulse 87   Temp 98 F (36.7 C) (Oral)   Resp 20   Ht 5\' 2"  (1.575 m)   Wt 91.2 kg   SpO2 91%   BMI 36.76 kg/m  Physical Exam Vitals and nursing note reviewed.  Constitutional:      General: She is not in acute distress.    Appearance: She is not ill-appearing.  HENT:     Head: Normocephalic and atraumatic.     Nose: No congestion.  Eyes:     Conjunctiva/sclera: Conjunctivae normal.  Cardiovascular:     Rate and Rhythm: Normal rate and regular rhythm.     Pulses: Normal pulses.     Heart sounds: No murmur heard.    No friction rub. No gallop.  Pulmonary:     Effort: No respiratory distress.     Breath sounds: No wheezing, rhonchi or rales.     Comments: On my evaluation while on room air patient was at 70% and then placed on a nasal cannula at 6 L and now remains in the low 90s, she is nontachypneic,  can speak in short phrases, lung sounds were clear bilaterally no wheezing rales or rhonchi present. Abdominal:     Palpations: Abdomen is soft.     Tenderness: There is no abdominal tenderness. There is no right CVA tenderness or left CVA tenderness.  Musculoskeletal:     Comments: Unilateral leg swelling present, right is greater than the left, no edema present, 2+ dorsal pedal pulses, slight calf tenderness on the right leg without palpable cords.  Skin:    General: Skin is warm and dry.  Neurological:     Mental Status: She is alert.  Psychiatric:        Mood and Affect: Mood normal.     ED Results / Procedures / Treatments   Labs (all labs ordered are listed, but only abnormal results are displayed) Labs Reviewed  CBC WITH DIFFERENTIAL/PLATELET - Abnormal;  Notable for the following components:      Result Value   RBC 3.77 (*)    Hemoglobin 9.0 (*)    HCT 30.4 (*)    MCH 23.9 (*)    MCHC 29.6 (*)    RDW 23.2 (*)    nRBC 0.5 (*)    All other components within normal limits  BASIC METABOLIC PANEL - Abnormal; Notable for the following components:   Glucose, Bld 304 (*)    All other components within normal limits  PROTIME-INR - Abnormal; Notable for the following components:  Prothrombin Time 16.0 (*)    INR 1.3 (*)    All other components within normal limits  I-STAT CHEM 8, ED - Abnormal; Notable for the following components:   Glucose, Bld 306 (*)    Hemoglobin 10.5 (*)    HCT 31.0 (*)    All other components within normal limits  RESP PANEL BY RT-PCR (FLU A&B, COVID) ARPGX2  BRAIN NATRIURETIC PEPTIDE  I-STAT VENOUS BLOOD GAS, ED  TROPONIN I (HIGH SENSITIVITY)  TROPONIN I (HIGH SENSITIVITY)    EKG EKG Interpretation  Date/Time:  Monday April 26 2022 05:04:05 EDT Ventricular Rate:  92 PR Interval:  142 QRS Duration: 74 QT Interval:  346 QTC Calculation: 427 R Axis:   45 Text Interpretation: Normal sinus rhythm ST & T wave abnormality, consider lateral ischemia Confirmed by Dory Horn) on 04/26/2022 5:29:42 AM  Radiology DG Chest Portable 1 View  Result Date: 04/26/2022 CLINICAL DATA:  Shortness of breath.  Status post CABG 1 week ago. EXAM: PORTABLE CHEST 1 VIEW COMPARISON:  03/27/2016 FINDINGS: 0534 hours. Low volume film. The cardio pericardial silhouette is enlarged. Vascular congestion with probable underlying interstitial pulmonary edema. Bibasilar atelectasis without substantial pleural effusion. IMPRESSION: Cardiomegaly with probable interstitial edema. Bibasilar atelectasis. Electronically Signed   By: Misty Stanley M.D.   On: 04/26/2022 05:42    Procedures .Critical Care  Performed by: Marcello Fennel, PA-C Authorized by: Marcello Fennel, PA-C   Critical care provider statement:    Critical  care time (minutes):  30   Critical care was necessary to treat or prevent imminent or life-threatening deterioration of the following conditions:  Respiratory failure   Critical care was time spent personally by me on the following activities:  Development of treatment plan with patient or surrogate, discussions with consultants, evaluation of patient's response to treatment, examination of patient, ordering and review of laboratory studies, ordering and review of radiographic studies, ordering and performing treatments and interventions, pulse oximetry, re-evaluation of patient's condition and review of old charts   I assumed direction of critical care for this patient from another provider in my specialty: no       Medications Ordered in ED Medications - No data to display  ED Course/ Medical Decision Making/ A&P                           Medical Decision Making Amount and/or Complexity of Data Reviewed Radiology: ordered.   This patient presents to the ED for concern of shortness of breath chest pain, this involves an extensive number of treatment options, and is a complaint that carries with it a high risk of complications and morbidity.  The differential diagnosis includes PE, dissection, pericardial effusion, CHF    Additional history obtained:  Additional history obtained from N/A External records from outside source obtained and reviewed including previous hospitalization   Co morbidities that complicate the patient evaluation  Immunosuppressed, recent CABG  Social Determinants of Health:  N/A    Lab Tests:  I Ordered, and personally interpreted labs.  The pertinent results include: I-STAT glucose 306 hemoglobin 10.5, CBC shows normocytic anemia of 9 which is at her baseline, BMP shows glucose of 304, prothrombin time INR elevated, first troponin is 13   Imaging Studies ordered:  I ordered imaging studies including chest x-ray, CT of chest I independently  visualized and interpreted imaging which showed cardiomegaly with interstitial edema I agree with the radiologist interpretation  Cardiac Monitoring:  The patient was maintained on a cardiac monitor.  I personally viewed and interpreted the cardiac monitored which showed an underlying rhythm of: Without ST elevation   Medicines ordered and prescription drug management:  I ordered medication including N/A I have reviewed the patients home medicines and have made adjustments as needed  Critical Interventions:  Respiratory failure placed on nonrebreather Reassessed wean her down to 6 L via nasal cannula with oxygen saturation in the low 90s.   Reevaluation:  Presents with shortness of breath, hypoxic on my examination, placed on 6 L via nasal cannula, concern for PE since recent CABG, will obtain lab work and send down for CT angio for further evaluation.  Consultations Obtained:  N/A   Test Considered:  Dissection study-defer as my suspicion for dissection very low at this time, no stabbing-like or tingling sensation rating to the back, presentation is atypical of etiology.    Rule out Low suspicion for URI/pneumonia not endorsing any URI-like symptoms, no rhonchi present my exam, chest x-ray is unremarked for this.  I have low suspicion for CHF exacerbation as she does not appear appear to be volume overloaded my exam, no JVD, no rales noted my exam, no pleural edema seen on chest x-ray.  I have low suspicion for MI/pericardial effusion as I do not hear muffled heart tones, no low voltage seen on EKG.  I have low suspicion of DVT in the right leg as she is anticoagulated, and it is noted to be large in the left but this is where her vein was harvested and likely secondary due to that.    Dispostion and problem list  Due to shift change patient may handoff to him maria belya PAC  Follow-up on CTA of chest, and treat accordingly, if unremarkable I would admit for acute  respiratory failure.              Final Clinical Impression(s) / ED Diagnoses Final diagnoses:  Hypoxia  Chest pain, unspecified type    Rx / DC Orders ED Discharge Orders     None         Marcello Fennel, PA-C 04/26/22 [id number]    Randal Buba, April, MD 04/26/22 [phone]

## 2022-04-26 NOTE — Progress Notes (Signed)
Attempted lower extremity venous duplex. Pt with echo. Will try again later.   Jinny Blossom Harley Mccartney 04/26/2022 3:51 PM

## 2022-04-26 NOTE — ED Notes (Signed)
Patient's bedding changed into new linen, clean chux, and purewick is still in place. Pt is cardiac monitor.

## 2022-04-26 NOTE — ED Notes (Signed)
Echo at bedside

## 2022-04-26 NOTE — ED Notes (Signed)
Pt returned from CT °

## 2022-04-26 NOTE — ED Provider Notes (Signed)
Care of the patient received from [facility], [facility], [facility].  Please see his note for full HPI.  In short, 61 year old female who is visiting her son in [place] after having a 3VCABG on 6/27 presented with chest pain and hypoxia.  Initially on a nonrebreather, weaned to 6 L however persistently desaturating, having to increase to 8 L.  She has chronic respiratory failure and is on 3 L at home since secondary to a COVID-19 infection.  She also has a history of renal transplant is on immunosuppressant.  Her work-up in the ED showed a stable hemoglobin of 9, BMP without any significant abnormalities, COVID and flu are negative.  Troponin of 13 and 27 respectively.  VBG with respiratory alkalosis.  BNP of 537.  Chest x-ray with new malleoli and probable interstitial edema.  Given recent surgery, the patient had a PE study ordered.  Care of the patient received pending this study.  I personally reviewed her imaging, agree with radiology read.  CT PE study negative for PE, increased lung markings suggest CHF with pulmonary edema and small bilateral pleural effusions.  Given increase in oxygenation requirements, I believe the patient would benefit from admission for further diuresis and close monitoring.  Consulted hospitalist for admission.   Patient will be admitted for further workup and management   Results for orders placed or performed during the hospital encounter of 04/26/22  Resp Panel by RT-PCR (Flu A&B, Covid) Anterior Nasal Swab   Specimen: Anterior Nasal Swab  Result Value Ref Range   SARS Coronavirus 2 by RT PCR NEGATIVE NEGATIVE   Influenza A by PCR NEGATIVE NEGATIVE   Influenza B by PCR NEGATIVE NEGATIVE  CBC with Differential  Result Value Ref Range   WBC 5.6 4.0 - 10.5 K/uL   RBC 3.77 (L) 3.87 - 5.11 MIL/uL   Hemoglobin 9.0 (L) 12.0 - 15.0 g/dL   HCT 30.4 (L) 36.0 - 46.0 %   MCV 80.6 80.0 - 100.0 fL   MCH 23.9 (L) 26.0 - 34.0 pg   MCHC 29.6 (L) 30.0 - 36.0 g/dL   RDW 23.2 (H) 11.5 - 15.5 %    Platelets 263 150 - 400 K/uL   nRBC 0.5 (H) 0.0 - 0.2 %   Neutrophils Relative % 58 %   Neutro Abs 3.3 1.7 - 7.7 K/uL   Lymphocytes Relative 23 %   Lymphs Abs 1.3 0.7 - 4.0 K/uL   Monocytes Relative 13 %   Monocytes Absolute 0.7 0.1 - 1.0 K/uL   Eosinophils Relative 4 %   Eosinophils Absolute 0.2 0.0 - 0.5 K/uL   Basophils Relative 1 %   Basophils Absolute 0.0 0.0 - 0.1 K/uL   Immature Granulocytes 1 %   Abs Immature Granulocytes 0.06 0.00 - 0.07 K/uL  Brain natriuretic peptide  Result Value Ref Range   B Natriuretic Peptide 537.9 (H) 0.0 - 100.0 pg/mL  Basic metabolic panel  Result Value Ref Range   Sodium 138 135 - 145 mmol/L   Potassium 4.3 3.5 - 5.1 mmol/L   Chloride 104 98 - 111 mmol/L   CO2 25 22 - 32 mmol/L   Glucose, Bld 304 (H) 70 - 99 mg/dL   BUN 16 8 - 23 mg/dL   Creatinine, Ser 0.82 0.44 - 1.00 mg/dL   Calcium 9.3 8.9 - 10.3 mg/dL   GFR, Estimated >60 >60 mL/min   Anion gap 9 5 - 15  Protime-INR  Result Value Ref Range   Prothrombin Time 16.0 (H) 11.4 -  15.2 seconds   INR 1.3 (H) 0.8 - 1.2  I-stat chem 8, ED  Result Value Ref Range   Sodium 137 135 - 145 mmol/L   Potassium 4.3 3.5 - 5.1 mmol/L   Chloride 104 98 - 111 mmol/L   BUN 17 8 - 23 mg/dL   Creatinine, Ser 0.70 0.44 - 1.00 mg/dL   Glucose, Bld 306 (H) 70 - 99 mg/dL   Calcium, Ion 1.18 1.15 - 1.40 mmol/L   TCO2 23 22 - 32 mmol/L   Hemoglobin 10.5 (L) 12.0 - 15.0 g/dL   HCT 31.0 (L) 36.0 - 46.0 %  I-Stat venous blood gas, [facility] ED only)  Result Value Ref Range   pH, Ven 7.478 (H) 7.25 - 7.43   pCO2, Ven 32.9 (L) 44 - 60 mmHg   pO2, Ven 38 32 - 45 mmHg   Bicarbonate 24.4 20.0 - 28.0 mmol/L   TCO2 25 22 - 32 mmol/L   O2 Saturation 77 %   Acid-Base Excess 1.0 0.0 - 2.0 mmol/L   Sodium 136 135 - 145 mmol/L   Potassium 4.4 3.5 - 5.1 mmol/L   Calcium, Ion 1.09 (L) 1.15 - 1.40 mmol/L   HCT 29.0 (L) 36.0 - 46.0 %   Hemoglobin 9.9 (L) 12.0 - 15.0 g/dL   Sample type VENOUS    Comment NOTIFIED PHYSICIAN    Troponin I (High Sensitivity)  Result Value Ref Range   Troponin I (High Sensitivity) 13 <18 ng/L  Troponin I (High Sensitivity)  Result Value Ref Range   Troponin I (High Sensitivity) 27 (H) <18 ng/L   CT Angio Chest PE W and/or Wo Contrast  Result Date: 04/26/2022 CLINICAL DATA:  Chest pain, shortness of breath EXAM: CT ANGIOGRAPHY CHEST WITH CONTRAST TECHNIQUE: Multidetector CT imaging of the chest was performed using the standard protocol during bolus administration of intravenous contrast. Multiplanar CT image reconstructions and MIPs were obtained to evaluate the vascular anatomy. RADIATION DOSE REDUCTION: This exam was performed according to the departmental dose-optimization program which includes automated exposure control, adjustment of the mA and/or kV according to patient size and/or use of iterative reconstruction technique. CONTRAST:  37mL OMNIPAQUE IOHEXOL 350 MG/ML SOLN COMPARISON:  Chest radiographs done on 04/26/2022, CT done on 06/10/2015 FINDINGS: Cardiovascular: Heart is enlarged in size. Extensive coronary artery calcifications are seen. There is homogeneous enhancement in thoracic aorta. There are no intraluminal filling defects in central pulmonary artery branches. Evaluation of small peripheral branches is limited by motion artifacts and infiltrates. Small pericardial effusion is present. Mediastinum/Nodes: No significant lymphadenopathy is seen in mediastinum. Surgical clips are seen in thyroid bed suggesting previous resection. Lungs/Pleura: Central pulmonary vessels are prominent. There are ground-glass densities in both lungs. There are linear patchy densities in both mid and lower lung fields. Small bilateral pleural effusions are seen, more so on the left side. There is no pneumothorax. Upper Abdomen: Unremarkable. Musculoskeletal: Metallic sutures are seen in the sternum, possibly related to coronary bypass surgery. Review of the MIP images confirms the above findings.  IMPRESSION: There is no evidence of central pulmonary artery embolism. There is no evidence of thoracic aortic dissection. Increased markings in both lungs suggest CHF with pulmonary edema. Small bilateral pleural effusions. Small pericardial effusion. Extensive coronary artery calcifications are seen. Other findings as described in the body of the report. Electronically Signed   By: Elmer Picker M.D.   On: 04/26/2022 09:20   DG Chest Portable 1 View  Result Date: 04/26/2022 CLINICAL DATA:  Shortness of breath.  Status post CABG 1 week ago. EXAM: PORTABLE CHEST 1 VIEW COMPARISON:  03/27/2016 FINDINGS: 0534 hours. Low volume film. The cardio pericardial silhouette is enlarged. Vascular congestion with probable underlying interstitial pulmonary edema. Bibasilar atelectasis without substantial pleural effusion. IMPRESSION: Cardiomegaly with probable interstitial edema. Bibasilar atelectasis. Electronically Signed   By: Misty Stanley M.D.   On: 04/26/2022 05:42       Garald Balding, PA-C 04/26/22 1010    Godfrey Pick, MD 04/26/22 [phone]

## 2022-04-26 NOTE — ED Notes (Signed)
Pt taken to CT by transport

## 2022-04-26 NOTE — ED Triage Notes (Signed)
CP SOB and initial sats 65% RA and now 95% NRB.  Recent CABG 1 week ago.  Started having CP yesterday and last 1.5 hour has got worse radiating to left flank.  Took 324 ASA pta and has received 2 nitro which has helped chest pain. NSR on 12 lead.

## 2022-04-27 ENCOUNTER — Encounter (HOSPITAL_COMMUNITY): Payer: Self-pay | Admitting: Internal Medicine

## 2022-04-27 ENCOUNTER — Ambulatory Visit: Payer: Self-pay | Admitting: Physical Therapy

## 2022-04-27 ENCOUNTER — Observation Stay (HOSPITAL_BASED_OUTPATIENT_CLINIC_OR_DEPARTMENT_OTHER): Payer: Medicare Other

## 2022-04-27 DIAGNOSIS — J9621 Acute and chronic respiratory failure with hypoxia: Secondary | ICD-10-CM | POA: Diagnosis not present

## 2022-04-27 DIAGNOSIS — I5032 Chronic diastolic (congestive) heart failure: Secondary | ICD-10-CM | POA: Diagnosis not present

## 2022-04-27 DIAGNOSIS — I25810 Atherosclerosis of coronary artery bypass graft(s) without angina pectoris: Secondary | ICD-10-CM | POA: Diagnosis not present

## 2022-04-27 DIAGNOSIS — M7989 Other specified soft tissue disorders: Secondary | ICD-10-CM | POA: Diagnosis not present

## 2022-04-27 DIAGNOSIS — I132 Hypertensive heart and chronic kidney disease with heart failure and with stage 5 chronic kidney disease, or end stage renal disease: Secondary | ICD-10-CM | POA: Diagnosis not present

## 2022-04-27 DIAGNOSIS — L8990 Pressure ulcer of unspecified site, unspecified stage: Secondary | ICD-10-CM | POA: Insufficient documentation

## 2022-04-27 LAB — COMPREHENSIVE METABOLIC PANEL
ALT: 19 U/L (ref 0–44)
AST: 17 U/L (ref 15–41)
Albumin: 2.7 g/dL — ABNORMAL LOW (ref 3.5–5.0)
Alkaline Phosphatase: 97 U/L (ref 38–126)
Anion gap: 8 (ref 5–15)
BUN: 13 mg/dL (ref 8–23)
CO2: 29 mmol/L (ref 22–32)
Calcium: 8.7 mg/dL — ABNORMAL LOW (ref 8.9–10.3)
Chloride: 99 mmol/L (ref 98–111)
Creatinine, Ser: 0.95 mg/dL (ref 0.44–1.00)
GFR, Estimated: >60 mL/min (ref >60)
Glucose, Bld: 232 mg/dL — ABNORMAL HIGH (ref 70–99)
Potassium: 3.8 mmol/L (ref 3.5–5.1)
Sodium: 136 mmol/L (ref 135–145)
Total Bilirubin: 0.6 mg/dL (ref 0.3–1.2)
Total Protein: 6.3 g/dL — ABNORMAL LOW (ref 6.5–8.1)

## 2022-04-27 LAB — CBC
HCT: 30.2 % — ABNORMAL LOW (ref 36.0–46.0)
Hemoglobin: 9 g/dL — ABNORMAL LOW (ref 12.0–15.0)
MCH: 23.7 pg — ABNORMAL LOW (ref 26.0–34.0)
MCHC: 29.8 g/dL — ABNORMAL LOW (ref 30.0–36.0)
MCV: 79.7 fL — ABNORMAL LOW (ref 80.0–100.0)
Platelets: 221 10*3/uL (ref 150–400)
RBC: 3.79 MIL/uL — ABNORMAL LOW (ref 3.87–5.11)
RDW: 22.7 % — ABNORMAL HIGH (ref 11.5–15.5)
WBC: 4.8 10*3/uL (ref 4.0–10.5)
nRBC: 0 % (ref 0.0–0.2)

## 2022-04-27 LAB — MAGNESIUM: Magnesium: 1.9 mg/dL (ref 1.7–2.4)

## 2022-04-27 LAB — GLUCOSE, CAPILLARY
Glucose-Capillary: 254 mg/dL — ABNORMAL HIGH (ref 70–99)
Glucose-Capillary: 290 mg/dL — ABNORMAL HIGH (ref 70–99)

## 2022-04-27 MED ORDER — METOPROLOL SUCCINATE ER 100 MG PO TB24: 100.0000 mg | ORAL_TABLET | Freq: Every day | ORAL | 0 refills | Status: DC

## 2022-04-27 MED ORDER — INSULIN ASPART 100 UNIT/ML IJ SOLN
0.0000 [IU] | INTRAMUSCULAR | Status: DC
  Administered 2022-04-27: 7 [IU] via SUBCUTANEOUS
  Administered 2022-04-27: 5 [IU] via SUBCUTANEOUS

## 2022-04-27 MED ORDER — FUROSEMIDE 20 MG PO TABS
20.0000 mg | ORAL_TABLET | Freq: Every day | ORAL | Status: DC
  Administered 2022-04-27: 20 mg via ORAL
  Filled 2022-04-27: qty 1

## 2022-04-27 NOTE — Discharge Instructions (Addendum)
You were hospitalized for acute exacerbation of your heart failure. Thank you for allowing [place] to be part of your care.   We arranged for you to follow up at:  Internal medicine clinic on 05/05/2022 at 10:15 AM Please keep your scheduled cardiology follow-up Also contact your primary care physician to schedule follow-up whenever you are able to move back to [facility]  Please note these changes made to your medications:   Please START taking:  Lisinopril 20 mg daily Metoprolol XR 100 mg daily   Please make sure to take your medications as prescribed and monitor your blood oxygen level as well as your blood pressure at home.  Please make sure you also keep your follow-up with our internal medicine clinic for your after hospital visit and to contact your primary care physician to set up a visit when you are able to get back to [place].  Please call our clinic if you have any questions or concerns, we may be able to help and keep you from a long and expensive emergency room wait. Our clinic and after hours phone number is [phone], the best time to call is Monday through Friday 9 am to 4 pm but there is always someone available 24/7 if you have an emergency. If you need medication refills please notify your pharmacy one week in advance and they will send [place] a request.

## 2022-04-27 NOTE — TOC Transition Note (Signed)
Transition of Care [facility]) - CM/SW Discharge Note   Patient Details  Name: MATTHEW PAIS MRN: [id number] Date of Birth: [birth year]  Transition [facility]) CM/SW Contact:  Zenon Mayo, RN Phone Number: 04/27/2022, 5:07 PM   Clinical Narrative:    Patient is for dc today, she will be going to her daughter's home in [place] at [facility], patient's cell is [phone].  NCM offered choice, she states she does not have a preference.  NCM made referral to [facility] with [facility].  He is able to take referral.  Soc will begin 24 to 48 hrs post dc.  Daughter will transport her home, she has oxygen tank in the car for patient.    Final next level of care: Home w Home Health Services Barriers to Discharge: No Barriers Identified   Patient Goals and CMS Choice Patient states their goals for this hospitalization and ongoing recovery are:: return to daughters home CMS Medicare.gov Compare Post Acute Care list provided to:: Patient Choice offered to / list presented to : Patient  Discharge Placement                       Discharge Plan and Services                  DME Agency: NA       HH Arranged: PT [place] Agency: [place] Date [place]: 04/27/22 Time [place]: 5537 Representative spoke with at [place]: [place] Determinants of Health ([place]) Interventions     Readmission Risk Interventions     No data to display

## 2022-04-27 NOTE — Progress Notes (Addendum)
  Date: 04/27/2022  Patient name: [name]  Medical record number: [id number]  Date of birth: [birth year]        I have seen and evaluated this patient and I have discussed the plan of care with the house staff. Please see Dr. Zannie Cove discharge summary for complete details. I concur with his findings.  This patient presented with chest pain and was found to be volume overloaded due to taking lower dose of lasix.  She has been concerned about being dry due to her kidney transplant.  She felt improvement with diuresis.  She was ambulated today and had desaturation, but this is apparently unchanged since her COVID diagnosis.  She is in [place] for convalescence after her CABG. She has all of her specialists in [place].  LE US showed chronic DVT and she was continued on her anticoagulation.  TTE showed normal EF and HFpEF.  She will be discharged with plan to follow up in our clinic while here in [place], and then she will follow up with her primary team when she moves back to [place].  She will continue on home oxygen as this is chronic for her.  Discharged was discussed with her along with daily lasix dosing and she is amenable to this plan.   Sid Falcon, MD 04/27/2022, 6:31 PM

## 2022-04-27 NOTE — Hospital Course (Addendum)
Acute on chronic hypoxic respiratory failure due to HFpEF exacerbation Patient presented with acute worsening sternal chest pain with some radiation to her left side that improved with nitro.  She is status post CABG on 03/16/2022 and had a prolonged hospital stay complicated by hypoxia and respiratory distress.  She was transferred to acute rehab and discharged on 04/23/2022.  After discharge she was previously doing well.  Previous TTE from 01/2022 showed an EF of 55%.  After her surgery she was told to take her Lasix 20 mg every other day which she was doing.  At home she is on 3 to 4 L O2 and this has largely been stable up to this event.  On admission her BNP was elevated 537.  CTA chest did not show any signs of pulmonary embolism but did show small bilateral pleural effusion and a small pericardial effusion.  Troponin trended with a peak of 44.  EKG was stable and normal sinus rhythm without any signs of acute ischemia.  She did well after IV diuresis and repeat echocardiogram showed stable EF of 60 to 65% with mild LVH and grade 2 diastolic dysfunction.  CT PR w/o PE c/w pulmonary edema and small bilateral pleural effusion.on the day of discharge she was walked with pulse ox monitoring and had a dip into the mid 80s but did not describe any bothersome dyspnea during this and states that she can usually drop down to about 85 when ambulating but only has some dyspnea whenever he gets lower than that.    CAD s/p CABG  HLD HTN CABG x 3 (LIMA to LAD, SVG to OM, SVG to PDA) on 03/16/2022. Discharged after completing AIR in [facility] 04/23/2022. Atypical chest pain yesterday while laying in bed, reproducible on palpation, however did improve with nitroglycerin.  Troponin trended as above with a peak of 44. EKG without acute ischemic changes.  We suspected this was musculoskeletal pain overall as it did not return and there were no other signs of ACS.  We continued her home medications of isordil 20 mg 3 times  daily, nifedipine 60 mg daily, icosapent.  She did not have any more of this pain during her admission.   Pericardial Effusion Small pericardial effusion noted CT PE study, likely in setting of recent cardiac surgery. Seems c/w finding on CT follow CABG at last admission.  Echocardiogram did not show any evidence of large pericardial effusion.    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  No signs of acute kidney injury.  We continued her home immunosuppression with tacrolimus, mycophenolate, bactrim, and prednisone.   History of recurrent UTI Reports history of recurrent UTI since renal transplant. Treated with IV zosyn 10 days at most recent admission for E Faecalis. Repeat culture grew Klebsiella pneumoniae and she has been on cefuroxime 500mg  twice daily for 7 days, last day 8/10.  She did not have any urinary symptoms during her admission and we continued her on cefdinir during this admission to complete 7 days.  Upon discharge we incorrectly indicated that she stopped the antibiotics on the AVS.  Patient was contacted after discharge to instruct her to go to complete the cefuroxime course which finishes on 04/29/2022.   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 Also has albuterol PRN at home. Reports used 1 neb treatment the day prior to admission, but has not needed it  much since discharge after CABG. VBG in ED reassuring.  We continued supplemental oxygen here we are able to titrate her down to 3 to 4 L which is her home dose.   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 Home regimen is glargine 15 units and SSI.  We kept her on Semglee 15 units with sliding scale during her admission.  And continued her home gabapentin 300 mg TID, Percocet 7.5, and Flexeril.   History of DVT On eliquis 5 mg twice daily. RLE edema > LLE likely in setting  recent CABG since a harvest of the veins from her right leg.  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  We continued her on Eliquis.   Anemia of chronic renal disease/chronic inflammation Stable during admission with a hemoglobin of 9.   Hypothyroidism Continued to her home levothyroxine 125 mcg daily.

## 2022-04-27 NOTE — Discharge Summary (Signed)
Name: [name] MRN: [id number] DOB: 03-11-1961 61 y.o. PCP: Ricke Hey, MD  Date of Admission: 04/26/2022  4:50 AM Date of Discharge: 04/27/2022 Attending Physician: Dr. Daryll Drown  Discharge Diagnosis: Principal Problem:   Acute exacerbation of CHF (congestive heart failure) ([place]) Active Problems:   Pressure injury of skin    Discharge Medications: Allergies as of 04/27/2022       Reactions   Lisinopril Swelling   Swelling of tongue   Hydralazine    Other reaction(s): heart palpitations        Medication List     TAKE these medications    Acidophilus High-Potency Caps Take 175 mg by mouth daily.   amitriptyline 10 MG tablet Commonly known as: ELAVIL Take 10 mg by mouth daily.   apixaban 5 MG Tabs tablet Commonly known as: ELIQUIS Take 5 mg by mouth 2 (two) times daily.   aspirin 81 MG chewable tablet Chew 81 mg by mouth daily.   atorvastatin 10 MG tablet Commonly known as: LIPITOR Take 10 mg by mouth daily.   benzonatate 200 MG capsule Commonly known as: TESSALON Take 1 capsule (200 mg total) by mouth 2 (two) times daily as needed for cough.   carvedilol 25 MG tablet Commonly known as: COREG Take 25 mg by mouth 2 (two) times daily.   cefUROXime 500 MG tablet Commonly known as: CEFTIN Take 500 mg by mouth daily.   cloNIDine 0.2 MG tablet Commonly known as: CATAPRES Take 0.2 mg by mouth 2 (two) times daily.   cyclobenzaprine 10 MG tablet Commonly known as: FLEXERIL Take 10 mg by mouth at bedtime as needed.   diphenhydrAMINE 25 mg capsule Commonly known as: BENADRYL Take 25 mg by mouth every 6 (six) hours as needed for allergies.   esomeprazole 40 MG capsule Commonly known as: NEXIUM Take 40 mg by mouth daily before breakfast.   ferrous sulfate 325 (65 FE) MG tablet Take 325 mg by mouth daily.   folic acid 1 MG tablet Commonly known as: FOLVITE Take 1 mg by mouth daily.   furosemide 20 MG tablet Commonly known as: LASIX Take  20 mg by mouth daily.   gabapentin 300 MG capsule Commonly known as: NEURONTIN Take 300 mg by mouth 3 (three) times daily.   icosapent Ethyl 1 g capsule Commonly known as: VASCEPA Take 1 g by mouth daily.   Insulin Glargine w/ Trans Port 100 UNIT/ML Sopn Inject 15 Units into the skin at bedtime.   Insulin Lispro Prot & Lispro (75-25) 100 UNIT/ML Kwikpen Commonly known as: HumaLOG Mix 75/25 KwikPen Inject 20-30 Units into the skin 2 (two) times daily with a meal. 30 units in the morning and 0-20 units in the evening (depending on BGL)   isosorbide dinitrate 20 MG tablet Commonly known as: ISORDIL Take 20 mg by mouth 3 (three) times daily.   levothyroxine 125 MCG tablet Commonly known as: SYNTHROID Take 125 mcg by mouth daily before breakfast.   Linzess 290 MCG Caps capsule Generic drug: linaclotide Take 290 mcg by mouth daily.   methocarbamol 500 MG tablet Commonly known as: ROBAXIN Take 1 tablet (500 mg total) by mouth at bedtime as needed for muscle spasms.   metoprolol succinate 100 MG 24 hr tablet Commonly known as: TOPROL-XL Take 1 tablet (100 mg total) by mouth daily. Take with or immediately following a meal. What changed:  medication strength how much to take   mycophenolate 360 MG Tbec EC tablet Commonly known as: MYFORTIC Take  360 mg by mouth daily.   naproxen 500 MG tablet Commonly known as: NAPROSYN Take 1 tablet (500 mg total) by mouth 2 (two) times daily with a meal.   NIFEdipine 60 MG 24 hr tablet Commonly known as: ADALAT CC Take 1 tablet (60 mg total) by mouth daily.   nitroGLYCERIN 0.4 MG SL tablet Commonly known as: NITROSTAT Place 0.4 mg under the tongue as needed.   NovoLOG FlexPen 100 UNIT/ML FlexPen Generic drug: insulin aspart Inject 5-15 Units into the skin with breakfast, with lunch, and with evening meal.   oxyCODONE-acetaminophen 10-325 MG tablet Commonly known as: PERCOCET Take 1 tablet by mouth every 6 (six) hours as needed  for pain.   pravastatin 40 MG tablet Commonly known as: PRAVACHOL Take 40 mg by mouth at bedtime.   predniSONE 5 MG tablet Commonly known as: DELTASONE Take 5 mg by mouth daily.   pregabalin 75 MG capsule Commonly known as: LYRICA Take 75 mg by mouth 2 (two) times daily.   promethazine 25 MG tablet Commonly known as: PHENERGAN Take 25 mg by mouth every 8 (eight) hours as needed for nausea or vomiting.   Rybelsus 7 MG Tabs Generic drug: Semaglutide Take 1 tablet by mouth daily.   sevelamer carbonate 800 MG tablet Commonly known as: RENVELA Take 800-1,600 mg by mouth 3 (three) times daily with meals. Take 2 tablets (1600 mg) by mouth with meals, and 1 tablet (800 mg) with snacks   sulfamethoxazole-trimethoprim 400-80 MG tablet Commonly known as: BACTRIM Take 1 tablet by mouth See admin instructions. 1 tablet by mouth Monday/wednesdays/fridays   tacrolimus ER 1 MG Tb24 Commonly known as: ENVARSUS XR Take 1 mg by mouth daily.   Ventolin HFA 108 (90 Base) MCG/ACT inhaler Generic drug: albuterol Inhale 1 puff into the lungs every 4 (four) hours as needed.               Discharge Care Instructions  (From admission, onward)           Start     Ordered   04/27/22 0000  No dressing needed        04/27/22 1707            Disposition and follow-up:   [name] was discharged from [facility] in Good condition.  At the hospital follow up visit please address:  1.  Follow-up:  a. Volume status and dyspnea on daily lasix 20 mg     b. HR and BP with resuming metoprolol 100 mg daily   c.  Ensure completion of antibiotics for E faecalis/Klebsiella pneumonia UTI.  2.  Labs / imaging needed at time of follow-up: BMP, CBC, EKG  3.  Pending labs/ test needing follow-up: None  4.  Medication Changes Resume Lasix 20 mg daily and Metoprolol 100 mg daily  Follow-up Appointments:  Follow-up Information     Care, [facility]  Follow up.   Specialty: [place] Why: [facility] will call you to set up apt times Contact information: [address] [place] [address] [phone]         Lacinda Axon, MD. Go on 05/05/2022.   Specialty: Internal Medicine Contact information: [place] [address] [phone]                 Hospital Course by problem list: Acute on chronic hypoxic respiratory failure due to HFpEF exacerbation Patient presented with acute worsening sternal chest  pain with some radiation to her left side that improved with nitro.  She is status post CABG on 03/16/2022 and had a prolonged hospital stay complicated by hypoxia and respiratory distress.  She was transferred to acute rehab and discharged on 04/23/2022.  After discharge she was previously doing well.  Previous TTE from 01/2022 showed an EF of 55%.  After her surgery she was told to take her Lasix 20 mg every other day which she was doing.  At home she is on 3 to 4 L O2 and this has largely been stable up to this event.  On admission her BNP was elevated 537.  CTA chest did not show any signs of pulmonary embolism but did show small bilateral pleural effusion and a small pericardial effusion.  Troponin trended with a peak of 44.  EKG was stable and normal sinus rhythm without any signs of acute ischemia.  She did well after IV diuresis and repeat echocardiogram showed stable EF of 60 to 65% with mild LVH and grade 2 diastolic dysfunction.  CT PR w/o PE c/w pulmonary edema and small bilateral pleural effusion.on the day of discharge she was walked with pulse ox monitoring and had a dip into the mid 80s but did not describe any bothersome dyspnea during this and states that she can usually drop down to about 85 when ambulating but only has some dyspnea whenever he gets lower than that.    CAD s/p CABG  HLD HTN CABG x 3 (LIMA to LAD, SVG to OM, SVG to PDA) on 03/16/2022. Discharged after  completing AIR in [facility] 04/23/2022. Atypical chest pain yesterday while laying in bed, reproducible on palpation, however did improve with nitroglycerin.  Troponin trended as above with a peak of 44. EKG without acute ischemic changes.  We suspected this was musculoskeletal pain overall as it did not return and there were no other signs of ACS.  We continued her home medications of isordil 20 mg 3 times daily, nifedipine 60 mg daily, icosapent.  She did not have any more of this pain during her admission.   Pericardial Effusion Small pericardial effusion noted CT PE study, likely in setting of recent cardiac surgery. Seems c/w finding on CT follow CABG at last admission.  Echocardiogram did not show any evidence of large pericardial effusion.    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  No signs of acute kidney injury.  We continued her home immunosuppression with tacrolimus, mycophenolate, bactrim, and prednisone.   History of recurrent UTI Reports history of recurrent UTI since renal transplant. Treated with IV zosyn 10 days at most recent admission for E Faecalis. Repeat culture grew Klebsiella pneumoniae and she has been on cefuroxime 500mg  twice daily for 7 days, last day 8/10.  She did not have any urinary symptoms during her admission and we continued her on cefdinir during this admission to complete 7 days.  Upon discharge we incorrectly indicated that she stopped the antibiotics on the AVS.  Patient was contacted after discharge to instruct her to go to complete the cefuroxime course which finishes on 04/29/2022.   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 Also has albuterol PRN at home. Reports used 1 neb treatment the day prior to admission, but has not needed it much since discharge after CABG. VBG in ED reassuring.  We continued supplemental oxygen  here we  are able to titrate her down to 3 to 4 L which is her home dose.   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 Home regimen is glargine 15 units and SSI.  We kept her on Semglee 15 units with sliding scale during her admission.  And continued her home gabapentin 300 mg TID, Percocet 7.5, and Flexeril.   History of DVT On eliquis 5 mg twice daily. RLE edema > LLE likely in setting recent CABG since a harvest of the veins from her right leg.  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  We continued her on Eliquis.   Anemia of chronic renal disease/chronic inflammation Stable during admission with a hemoglobin of 9.   Hypothyroidism Continued to her home levothyroxine 125 mcg daily.   Discharge Subjective: [name] is a 61 year old female with a past medical history of CAD s/p CABG on 03/16/2022, chronic respiratory failure secondary to COVID-pneumonia in 2020 on 3 L O2 at home, ESRD status post transplant on immunosuppression, HFpEF, prior DVT on Eliquis, T2DM, and HLD who presented with acute chest pain and dyspnea and was admitted for acute exacerbation of her heart failure.  This morning she is doing well and states her dyspnea and swelling has improved a lot after the IV diuretic.  She denies any new or worsening problems and states that she even feels better than she did when she left the hospital after her CABG.  Discharge Exam:   BP (!) 142/58   Pulse 93   Temp 98.5 F (36.9 C) (Oral)   Resp 20   Ht 5\' 2"  (1.575 m)   Wt 88.6 kg   SpO2 95%   BMI 35.73 kg/m  Constitutional: well-appearing female sitting in bed, in no acute distress HENT: normocephalic atraumatic Neck: supple Cardiovascular: regular rate and rhythm, no m/r/g Pulmonary/Chest: normal work of  breathing on room air, mildly decreased lung sounds in the bases bilaterally Abdominal: soft, non-tender, non-distended MSK: normal bulk and tone, 1+ edema in the lower extremities below the knees. Neurological: alert & oriented x 3 Skin: warm and dry  Pertinent Labs, Studies, and Procedures:     Latest Ref Rng & Units 04/27/2022    7:20 AM 04/26/2022    8:12 AM 04/26/2022    5:50 AM  CBC  WBC 4.0 - 10.5 K/uL 4.8     Hemoglobin 12.0 - 15.0 g/dL 9.0  9.9  10.5   Hematocrit 36.0 - 46.0 % 30.2  29.0  31.0   Platelets 150 - 400 K/uL 221          Latest Ref Rng & Units 04/27/2022    7:20 AM 04/26/2022    8:12 AM 04/26/2022    5:50 AM  CMP  Glucose 70 - 99 mg/dL 232   306   BUN 8 - 23 mg/dL 13   17   Creatinine 0.44 - 1.00 mg/dL 0.95   0.70   Sodium 135 - 145 mmol/L 136  136  137   Potassium 3.5 - 5.1 mmol/L 3.8  4.4  4.3   Chloride 98 - 111 mmol/L 99   104   CO2 22 - 32 mmol/L 29     Calcium 8.9 - 10.3 mg/dL 8.7     Total Protein 6.5 - 8.1 g/dL 6.3  Total Bilirubin 0.3 - 1.2 mg/dL 0.6     Alkaline Phos 38 - 126 U/L 97     AST 15 - 41 U/L 17     ALT 0 - 44 U/L 19       VAS [place] LOWER EXTREMITY VENOUS (DVT)  Result Date: 04/27/2022  Lower Venous DVT Study Patient Name:  [name] [facility]  Date of Exam:   04/27/2022 Medical Rec #: [id number]        Accession #:    [id number] Date of Birth: September 12, 1961        Patient Gender: F Patient Age:   57 years Exam Location:  [facility] Procedure:      VAS [place] LOWER EXTREMITY VENOUS (DVT) Referring Phys: EMILY MULLEN --------------------------------------------------------------------------------  Indications: Swelling, s/p GSV harvest for bypass.  Comparison Study: 02-18-2022 Prior right lower extremity venous performed at                   outside facility was positive for non-occlusive thrombus in                   one of the two duplicated femoral veins. Performing Technologist: Darlin Coco RDMS, RVT  Examination Guidelines: A complete evaluation  includes B-mode imaging, spectral Doppler, color Doppler, and power Doppler as needed of all accessible portions of each vessel. Bilateral testing is considered an integral part of a complete examination. Limited examinations for reoccurring indications may be performed as noted. The reflux portion of the exam is performed with the patient in reverse Trendelenburg.  +---------+---------------+---------+-----------+----------------+-------------+ RIGHT    CompressibilityPhasicitySpontaneityProperties      Thrombus                                                                  Aging         +---------+---------------+---------+-----------+----------------+-------------+ CFV      Full           Yes      Yes                                      +---------+---------------+---------+-----------+----------------+-------------+ SFJ      Full                                                             +---------+---------------+---------+-----------+----------------+-------------+ FV Prox  Partial                            Duplicated- one Chronic                                                   patent, one  thrombosed                    +---------+---------------+---------+-----------+----------------+-------------+ FV Mid   Partial                                            Chronic       +---------+---------------+---------+-----------+----------------+-------------+ FV DistalPartial                                            Chronic       +---------+---------------+---------+-----------+----------------+-------------+ PFV      Full                                                             +---------+---------------+---------+-----------+----------------+-------------+ POP      Full                                                              +---------+---------------+---------+-----------+----------------+-------------+ PTV      Full                                                             +---------+---------------+---------+-----------+----------------+-------------+ PERO     Full                                                             +---------+---------------+---------+-----------+----------------+-------------+ Gastroc  None           No       No                         Chronic       +---------+---------------+---------+-----------+----------------+-------------+   +----+---------------+---------+-----------+----------+--------------+ LEFTCompressibilityPhasicitySpontaneityPropertiesThrombus Aging +----+---------------+---------+-----------+----------+--------------+ CFV Full           Yes      Yes                                 +----+---------------+---------+-----------+----------+--------------+    Summary: RIGHT: - Findings consistent with chronic deep vein thrombosis involving the right femoral vein, and right gastrocnemius veins. - Findings appear essentially unchanged compared to previous examination. - No cystic structure found in the popliteal fossa.  LEFT: - No evidence of common femoral vein obstruction.  *See table(s) above for measurements and observations.    Preliminary    ECHOCARDIOGRAM COMPLETE  Result Date: 04/26/2022    ECHOCARDIOGRAM REPORT   Patient Name:   [name] [facility] Date of  Exam: 04/26/2022 Medical Rec #:  [id number]       Height:       62.0 in Accession #:    [id number]      Weight:       201.0 lb Date of Birth:  08/20/1961       BSA:          1.916 m Patient Age:    83 years        BP:           140/53 mmHg Patient Gender: F               HR:           92 bpm. Exam Location:  Inpatient Procedure: 2D Echo, Cardiac Doppler and Color Doppler Indications:    CHF  History:        Patient has no prior history of Echocardiogram examinations.                 CHF, CAD, CABG outside  hospital 03/16/22,                 Signs/Symptoms:Shortness of Breath; Risk Factors:Hypertension,                 Diabetes and Dyslipidemia.  Sonographer:    Ronny Flurry Referring Phys: [place] Comments: The patient is post recent CABG and could not tolerate pressure beyond apical 4-C. IMPRESSIONS  1. Left ventricular ejection fraction, by estimation, is 60 to 65%. The left ventricle has normal function. The left ventricle has no regional wall motion abnormalities. There is mild left ventricular hypertrophy. Left ventricular diastolic parameters are consistent with Grade II diastolic dysfunction (pseudonormalization).  2. Right ventricular systolic function is normal. The right ventricular size is normal.  3. The mitral valve is normal in structure. No evidence of mitral valve regurgitation. No evidence of mitral stenosis.  4. The aortic valve is normal in structure. Aortic valve regurgitation is not visualized. No aortic stenosis is present.  5. The inferior vena cava is normal in size with greater than 50% respiratory variability, suggesting right atrial pressure of 3 mmHg. FINDINGS  Left Ventricle: Left ventricular ejection fraction, by estimation, is 60 to 65%. The left ventricle has normal function. The left ventricle has no regional wall motion abnormalities. The left ventricular internal cavity size was normal in size. There is  mild left ventricular hypertrophy. Left ventricular diastolic parameters are consistent with Grade II diastolic dysfunction (pseudonormalization). Right Ventricle: The right ventricular size is normal. No increase in right ventricular wall thickness. Right ventricular systolic function is normal. Left Atrium: Left atrial size was normal in size. Right Atrium: Right atrial size was normal in size. Pericardium: There is no evidence of pericardial effusion. Mitral Valve: The mitral valve is normal in structure. No evidence of mitral valve regurgitation. No  evidence of mitral valve stenosis. Tricuspid Valve: The tricuspid valve is normal in structure. Tricuspid valve regurgitation is not demonstrated. No evidence of tricuspid stenosis. Aortic Valve: The aortic valve is normal in structure. Aortic valve regurgitation is not visualized. No aortic stenosis is present. Pulmonic Valve: The pulmonic valve was normal in structure. Pulmonic valve regurgitation is not visualized. No evidence of pulmonic stenosis. Aorta: The aortic root is normal in size and structure. Venous: The inferior vena cava is normal in size with greater than 50% respiratory variability, suggesting right atrial pressure of 3 mmHg. IAS/Shunts: No atrial level shunt detected by color flow  Doppler.  LEFT VENTRICLE PLAX 2D LVIDd:         2.50 cm   Diastology LVIDs:         1.70 cm   LV e' medial:    6.57 cm/s LV PW:         1.40 cm   LV E/e' medial:  16.9 LV IVS:        1.50 cm   LV e' lateral:   7.51 cm/s LVOT diam:     1.80 cm   LV E/e' lateral: 14.8 LV SV:         50 LV SV Index:   26 LVOT Area:     2.54 cm  RIGHT VENTRICLE RV S prime:     8.08 cm/s TAPSE (M-mode): 1.9 cm LEFT ATRIUM           Index        RIGHT ATRIUM           Index LA diam:      4.30 cm 2.24 cm/m   RA Area:     19.80 cm LA Vol (A4C): 51.0 ml 26.62 ml/m  RA Volume:   59.20 ml  30.90 ml/m  AORTIC VALVE LVOT Vmax:   109.00 cm/s LVOT Vmean:  71.400 cm/s LVOT VTI:    0.197 m  AORTA Ao Root diam: 2.80 cm Ao Asc diam:  2.60 cm MITRAL VALVE MV Area (PHT): 4.33 cm     SHUNTS MV Decel Time: 175 msec     Systemic VTI:  0.20 m MV E velocity: 111.00 cm/s  Systemic Diam: 1.80 cm MV A velocity: 85.20 cm/s MV E/A ratio:  1.30 Candee Furbish MD Electronically signed by Candee Furbish MD Signature Date/Time: 04/26/2022/5:12:03 PM    Final    CT Angio Chest PE W and/or Wo Contrast  Result Date: 04/26/2022 CLINICAL DATA:  Chest pain, shortness of breath EXAM: CT ANGIOGRAPHY CHEST WITH CONTRAST TECHNIQUE: Multidetector CT imaging of the chest was  performed using the standard protocol during bolus administration of intravenous contrast. Multiplanar CT image reconstructions and MIPs were obtained to evaluate the vascular anatomy. RADIATION DOSE REDUCTION: This exam was performed according to the departmental dose-optimization program which includes automated exposure control, adjustment of the mA and/or kV according to patient size and/or use of iterative reconstruction technique. CONTRAST:  73mL OMNIPAQUE IOHEXOL 350 MG/ML SOLN COMPARISON:  Chest radiographs done on 04/26/2022, CT done on 06/10/2015 FINDINGS: Cardiovascular: Heart is enlarged in size. Extensive coronary artery calcifications are seen. There is homogeneous enhancement in thoracic aorta. There are no intraluminal filling defects in central pulmonary artery branches. Evaluation of small peripheral branches is limited by motion artifacts and infiltrates. Small pericardial effusion is present. Mediastinum/Nodes: No significant lymphadenopathy is seen in mediastinum. Surgical clips are seen in thyroid bed suggesting previous resection. Lungs/Pleura: Central pulmonary vessels are prominent. There are ground-glass densities in both lungs. There are linear patchy densities in both mid and lower lung fields. Small bilateral pleural effusions are seen, more so on the left side. There is no pneumothorax. Upper Abdomen: Unremarkable. Musculoskeletal: Metallic sutures are seen in the sternum, possibly related to coronary bypass surgery. Review of the MIP images confirms the above findings. IMPRESSION: There is no evidence of central pulmonary artery embolism. There is no evidence of thoracic aortic dissection. Increased markings in both lungs suggest CHF with pulmonary edema. Small bilateral pleural effusions. Small pericardial effusion. Extensive coronary artery calcifications are seen. Other findings as described in the body of  the report. Electronically Signed   By: Elmer Picker M.D.   On:  04/26/2022 09:20   DG Chest Portable 1 View  Result Date: 04/26/2022 CLINICAL DATA:  Shortness of breath.  Status post CABG 1 week ago. EXAM: PORTABLE CHEST 1 VIEW COMPARISON:  03/27/2016 FINDINGS: 0534 hours. Low volume film. The cardio pericardial silhouette is enlarged. Vascular congestion with probable underlying interstitial pulmonary edema. Bibasilar atelectasis without substantial pleural effusion. IMPRESSION: Cardiomegaly with probable interstitial edema. Bibasilar atelectasis. Electronically Signed   By: Misty Stanley M.D.   On: 04/26/2022 05:42     Discharge Instructions: Discharge Instructions     Call MD for:  difficulty breathing, headache or visual disturbances   Complete by: As directed    Call MD for:  extreme fatigue   Complete by: As directed    Call MD for:  persistant dizziness or light-headedness   Complete by: As directed    Call MD for:  persistant nausea and vomiting   Complete by: As directed    Call MD for:  severe uncontrolled pain   Complete by: As directed    Call MD for:  temperature >100.4   Complete by: As directed    Diet - low sodium heart healthy   Complete by: As directed    Increase activity slowly   Complete by: As directed    No dressing needed   Complete by: As directed        Signed: Johny Blamer, DO 04/27/2022, 7:29 PM   Pager: [phone]

## 2022-04-27 NOTE — Evaluation (Signed)
Physical Therapy Evaluation Patient Details Name: CECEILIA CEPHUS MRN: [id number] DOB: 22-Aug-1961 Today's Date: 04/27/2022  History of Present Illness  61 year old female who is visiting her son in [place] after having a 3VCABG on 6/27 presented with chest pain and hypoxia. PMH: chronic respiratory failure and is on 3 L at home since secondary to a COVID-19 infection; renal transplant is on immunosuppressant; HTN; DVTs, DM, ESRD on HD, PN, PAD  Clinical Impression  Pt presents to PT with decreased mobility and activity tolerance from her baseline before her CABG in June but is very close to or at her baseline since dc to son's home from rehab. Pt able to safely mobilize with walker and O2 in the home. Recommend HHPT to work on improving stamina and activity tolerance to eventually allow pt to return to her own home alone. Pt reports her breathing is much better today and that since covid in 2020 she has used home O2 and her SpO2 drops with activity.      Recommendations for follow up therapy are one component of a multi-disciplinary discharge planning process, led by the attending physician.  Recommendations may be updated based on patient status, additional functional criteria and insurance authorization.  Follow Up Recommendations Home health PT      Assistance Recommended at Discharge PRN  Patient can return home with the following  Help with stairs or ramp for entrance    Equipment Recommendations None recommended by PT  Recommendations for Other Services       Functional Status Assessment Patient has had a recent decline in their functional status and demonstrates the ability to make significant improvements in function in a reasonable and predictable amount of time.     Precautions / Restrictions Precautions Precautions: Fall Precaution Comments: 3L at baseline; 4L for activity Restrictions Other Position/Activity Restrictions: sternal precautions? CABG 6/27       Mobility  Bed Mobility               General bed mobility comments: Pt up in chair    Transfers Overall transfer level: Needs assistance Equipment used: Rolling walker (2 wheels) Transfers: Sit to/from Stand Sit to Stand: Supervision           General transfer comment: for lines    Ambulation/Gait Ambulation/Gait assistance: Supervision Gait Distance (Feet): 200 Feet Assistive device: Rolling walker (2 wheels) Gait Pattern/deviations: Step-through pattern, Decreased stride length Gait velocity: decr Gait velocity interpretation: 1.31 - 2.62 ft/sec, indicative of limited community ambulator   General Gait Details: supervision for lines.  Stairs            Wheelchair Mobility    Modified Rankin (Stroke Patients Only)       Balance Overall balance assessment: Mild deficits observed, not formally tested                                           Pertinent Vitals/Pain Pain Assessment Pain Assessment: No/denies pain    Home Living Family/patient expects to be discharged to:: Private residence Living Arrangements: Children Available Help at Discharge: Family;Available 24 hours/day Type of Home: House Home Access: Stairs to enter Entrance Stairs-Rails: Psychiatric nurse of Steps: 4-5   Home Layout: Two level;Able to live on main level with bedroom/bathroom Home Equipment: Rolling Walker (2 wheels);BSC/3in1;Shower seat      Prior Function Prior Level of Function : Needs assist  Mobility Comments: Using rolling walker modified independent ADLs Comments: Was completing ADL tasks after CABG - family assists as needed; good recall of sternal precautions     Hand Dominance        Extremity/Trunk Assessment   Upper Extremity Assessment Upper Extremity Assessment: Defer to OT evaluation    Lower Extremity Assessment Lower Extremity Assessment: Generalized weakness       Communication    Communication: No difficulties  Cognition Arousal/Alertness: Awake/alert Behavior During Therapy: WFL for tasks assessed/performed Overall Cognitive Status: Within Functional Limits for tasks assessed                                          General Comments General comments (skin integrity, edema, etc.): SpO2 mid 80's with amb on 4L. Return to >90% after brief rest.    Exercises     Assessment/Plan    PT Assessment All further PT needs can be met in the next venue of care  PT Problem List Decreased strength;Decreased activity tolerance;Decreased mobility;Cardiopulmonary status limiting activity       PT Treatment Interventions      PT Goals (Current goals can be found in the Care Plan section)  Acute Rehab PT Goals PT Goal Formulation: All assessment and education complete, DC therapy    Frequency       Co-evaluation               AM-PAC PT "6 Clicks" Mobility  Outcome Measure Help needed turning from your back to your side while in a flat bed without using bedrails?: None Help needed moving from lying on your back to sitting on the side of a flat bed without using bedrails?: None Help needed moving to and from a bed to a chair (including a wheelchair)?: None Help needed standing up from a chair using your arms (e.g., wheelchair or bedside chair)?: None Help needed to walk in hospital room?: A Little Help needed climbing 3-5 steps with a railing? : A Little 6 Click Score: 22    End of Session Equipment Utilized During Treatment: Oxygen Activity Tolerance: Patient tolerated treatment well Patient left: in chair;with call bell/phone within reach Nurse Communication: Mobility status PT Visit Diagnosis: Other abnormalities of gait and mobility (R26.89);Muscle weakness (generalized) (M62.81)    Time: 5784-6962 PT Time Calculation (min) (ACUTE ONLY): 13 min   Charges:   PT Evaluation $PT Eval Moderate Complexity: [place] 04/27/2022, 1:44 PM

## 2022-04-27 NOTE — Progress Notes (Signed)
   04/27/22 1600  Mobility  Activity Ambulated with assistance in hallway  Level of [place] wheel walker  Distance Ambulated (ft) 550 ft  Activity Response Tolerated well  $Mobility charge 1 Mobility   Mobility Specialist Progress Note  Pt was in chair & agreeable. Was left back in chair w/call bell in reach.   Lucious Groves Mobility Specialist

## 2022-04-27 NOTE — Evaluation (Signed)
Occupational Therapy Evaluation Patient Details Name: [name] MRN: [id number] DOB: 1961/02/14 Today's Date: 04/27/2022   History of Present Illness 61 year old female who is visiting her son in [place] after having a 3VCABG on 6/27 presented with chest pain and hypoxia. PMH: chronic respiratory failure and is on 3 L at home since secondary to a COVID-19 infection; renal transplant is on immunosuppressant; HTN; DVTs, DM, ESRD on HD, PN, PAD   Clinical Impression   PTA pt living with her son while recovering after her recent CABG. She underwent rehab at [facility] in [place]. She plans to return to her home after she recovers. Pt most likely close to her postop baseline. Good demonstrations of sternal precautions during ADL and mobility. Desats to mid 80s on 4L during activity, however quickly rebounds to the 90s with a seated rest break, Pt states this is normal since having Covid. Pt states she needs to find the "happy medium" regarding her fluid intake referencing drinking enough fluid for her kidney, yet not drinking too much due to her CHF. Recommend further education on management of fluid to reduce risk of readmission. No further OT needs. OT signing off. Pt very appreciative.      Recommendations for follow up therapy are one component of a multi-disciplinary discharge planning process, led by the attending physician.  Recommendations may be updated based on patient status, additional functional criteria and insurance authorization.   Follow Up Recommendations  No OT follow up    Assistance Recommended at Discharge Intermittent Supervision/Assistance  Patient can return home with the following A little help with bathing/dressing/bathroom;Assistance with cooking/housework;Direct supervision/assist for medications management;Assist for transportation;Help with stairs or ramp for entrance    Functional Status Assessment  Patient has not had a recent decline in their functional status   Equipment Recommendations  None recommended by OT    Recommendations for Other Services       Precautions / Restrictions Precautions Precautions: Fall Precaution Comments: 3L at baseline; 4L for activity Restrictions Other Position/Activity Restrictions: sternal precautions? CABG 6/27      Mobility Bed Mobility Overal bed mobility: Modified Independent                  Transfers Overall transfer level: Needs assistance Equipment used: Rolling walker (2 wheels) Transfers: Sit to/from Stand Sit to Stand: Supervision                  Balance Overall balance assessment: Mild deficits observed, not formally tested                                         ADL either performed or assessed with clinical judgement   ADL Overall ADL's : At baseline                                       General ADL Comments: good recall of sternal precautions     Vision         Perception     Praxis      Pertinent Vitals/Pain Pain Assessment Pain Assessment: No/denies pain     Hand Dominance Right   Extremity/Trunk Assessment Upper Extremity Assessment Upper Extremity Assessment: Overall WFL for tasks assessed   Lower Extremity Assessment Lower Extremity Assessment: Defer to PT evaluation  Communication Communication Communication: No difficulties   Cognition Arousal/Alertness: Awake/alert Behavior During Therapy: WFL for tasks assessed/performed Overall Cognitive Status: Within Functional Limits for tasks assessed                                       General Comments  SpO2 mid 80's with amb on 4L. Return to >90% after brief rest.    Exercises     Shoulder Instructions      Home Living Family/patient expects to be discharged to:: Private residence Living Arrangements: Children Available Help at Discharge: Family;Available 24 hours/day Type of Home: House Home Access: Stairs to enter [employer] of Steps: 4-5 Entrance Stairs-Rails: Right;Left Home Layout: Two level;Able to live on main level with bedroom/bathroom     Bathroom Shower/Tub: Occupational psychologist: Standard Bathroom Accessibility: Yes How Accessible: Accessible via walker Home Equipment: Conservation officer, nature (2 wheels);BSC/3in1;Shower seat          Prior Functioning/Environment Prior Level of Function : Needs assist             Mobility Comments: Using rolling walker modified independent ADLs Comments: Was completing ADL tasks after CABG - family assists as needed; good recall of sternal precautions        OT Problem List: Decreased activity tolerance;Cardiopulmonary status limiting activity      OT Treatment/Interventions:      OT Goals(Current goals can be found in the care plan section) Acute Rehab OT Goals Patient Stated Goal: to return to her home in [place] after rehab OT Goal Formulation: All assessment and education complete, DC therapy  OT Frequency:      Co-evaluation              AM-PAC OT "6 Clicks" Daily Activity     Outcome Measure Help from another person eating meals?: None Help from another person taking care of personal grooming?: None Help from another person toileting, which includes using toliet, bedpan, or urinal?: None Help from another person bathing (including washing, rinsing, drying)?: None Help from another person to put on and taking off regular upper body clothing?: None Help from another person to put on and taking off regular lower body clothing?: None 6 Click Score: 24   End of Session Equipment Utilized During Treatment: Gait belt;Rolling walker (2 wheels) Nurse Communication: Mobility status  Activity Tolerance: Patient tolerated treatment well Patient left: in chair;with call bell/phone within reach  OT Visit Diagnosis: Muscle weakness (generalized) (M62.81)                Time: 3383-2919 OT Time Calculation (min): 22  min Charges:  OT General Charges $OT Visit: 1 Visit OT Evaluation $OT Eval Moderate Complexity: [place], OT/L   Acute OT Clinical Specialist Acute Rehabilitation Services Pager [phone] Office [phone]   [facility] 04/27/2022, 2:24 PM

## 2022-04-27 NOTE — Progress Notes (Signed)
Lower extremity venous right study completed.   Please see CV Proc for preliminary results.   Collen Hostler, RDMS, RVT  

## 2022-04-27 NOTE — Care Management Obs Status (Signed)
[facility] NOTIFICATION   Patient Details  Name: [name] MRN: [id number] Date of Birth: Jan 24, 1961   Medicare Observation Status Notification Given:  Yes    Zenon Mayo, RN 04/27/2022, 5:04 PM

## 2022-04-30 LAB — GLUCOSE, CAPILLARY: Glucose-Capillary: 304 mg/dL — ABNORMAL HIGH (ref 70–99)

## 2022-05-05 ENCOUNTER — Encounter: Payer: Medicare Other | Admitting: Student

## 2022-05-09 ENCOUNTER — Other Ambulatory Visit: Payer: Self-pay

## 2022-05-09 ENCOUNTER — Emergency Department (HOSPITAL_COMMUNITY): Payer: Medicare Other

## 2022-05-09 ENCOUNTER — Emergency Department (HOSPITAL_COMMUNITY)
Admission: EM | Admit: 2022-05-09 | Discharge: 2022-05-10 | Disposition: A | Discharge status: Home / Self Care | Payer: Medicare Other | Source: Home / Self Care | Attending: Emergency Medicine | Admitting: Emergency Medicine

## 2022-05-09 DIAGNOSIS — Z7982 Long term (current) use of aspirin: Secondary | ICD-10-CM | POA: Insufficient documentation

## 2022-05-09 DIAGNOSIS — R600 Localized edema: Secondary | ICD-10-CM | POA: Insufficient documentation

## 2022-05-09 DIAGNOSIS — M545 Low back pain, unspecified: Secondary | ICD-10-CM | POA: Insufficient documentation

## 2022-05-09 DIAGNOSIS — B9689 Other specified bacterial agents as the cause of diseases classified elsewhere: Secondary | ICD-10-CM | POA: Insufficient documentation

## 2022-05-09 DIAGNOSIS — Z955 Presence of coronary angioplasty implant and graft: Secondary | ICD-10-CM | POA: Insufficient documentation

## 2022-05-09 DIAGNOSIS — I132 Hypertensive heart and chronic kidney disease with heart failure and with stage 5 chronic kidney disease, or end stage renal disease: Secondary | ICD-10-CM | POA: Diagnosis not present

## 2022-05-09 DIAGNOSIS — R109 Unspecified abdominal pain: Secondary | ICD-10-CM | POA: Insufficient documentation

## 2022-05-09 DIAGNOSIS — I509 Heart failure, unspecified: Secondary | ICD-10-CM

## 2022-05-09 DIAGNOSIS — Z7901 Long term (current) use of anticoagulants: Secondary | ICD-10-CM | POA: Insufficient documentation

## 2022-05-09 DIAGNOSIS — Z794 Long term (current) use of insulin: Secondary | ICD-10-CM | POA: Insufficient documentation

## 2022-05-09 DIAGNOSIS — Z86718 Personal history of other venous thrombosis and embolism: Secondary | ICD-10-CM | POA: Insufficient documentation

## 2022-05-09 DIAGNOSIS — N390 Urinary tract infection, site not specified: Secondary | ICD-10-CM

## 2022-05-09 LAB — BASIC METABOLIC PANEL
Anion gap: 12 (ref 5–15)
BUN: 13 mg/dL (ref 8–23)
CO2: 21 mmol/L — ABNORMAL LOW (ref 22–32)
Calcium: 9 mg/dL (ref 8.9–10.3)
Chloride: 103 mmol/L (ref 98–111)
Creatinine, Ser: 1.02 mg/dL — ABNORMAL HIGH (ref 0.44–1.00)
GFR, Estimated: >60 mL/min (ref >60)
Glucose, Bld: 146 mg/dL — ABNORMAL HIGH (ref 70–99)
Potassium: 3.9 mmol/L (ref 3.5–5.1)
Sodium: 136 mmol/L (ref 135–145)

## 2022-05-09 LAB — CBC WITH DIFFERENTIAL/PLATELET
Abs Immature Granulocytes: 0.04 10*3/uL (ref 0.00–0.07)
Basophils Absolute: 0 10*3/uL (ref 0.0–0.1)
Basophils Relative: 1 %
Eosinophils Absolute: 0.3 10*3/uL (ref 0.0–0.5)
Eosinophils Relative: 8 %
HCT: 38.7 % (ref 36.0–46.0)
Hemoglobin: 11.2 g/dL — ABNORMAL LOW (ref 12.0–15.0)
Immature Granulocytes: 1 %
Lymphocytes Relative: 20 %
Lymphs Abs: 0.8 10*3/uL (ref 0.7–4.0)
MCH: 23.7 pg — ABNORMAL LOW (ref 26.0–34.0)
MCHC: 28.9 g/dL — ABNORMAL LOW (ref 30.0–36.0)
MCV: 82 fL (ref 80.0–100.0)
Monocytes Absolute: 0.4 10*3/uL (ref 0.1–1.0)
Monocytes Relative: 10 %
Neutro Abs: 2.4 10*3/uL (ref 1.7–7.7)
Neutrophils Relative %: 60 %
Platelets: 192 10*3/uL (ref 150–400)
RBC: 4.72 MIL/uL (ref 3.87–5.11)
RDW: 20.6 % — ABNORMAL HIGH (ref 11.5–15.5)
WBC: 4 10*3/uL (ref 4.0–10.5)
nRBC: 0 % (ref 0.0–0.2)

## 2022-05-09 LAB — URINALYSIS, ROUTINE W REFLEX MICROSCOPIC
Bilirubin Urine: NEGATIVE
Glucose, UA: NEGATIVE mg/dL
Hgb urine dipstick: NEGATIVE
Ketones, ur: NEGATIVE mg/dL
Nitrite: NEGATIVE
Protein, ur: NEGATIVE mg/dL
Specific Gravity, Urine: 1.015 (ref 1.005–1.030)
pH: 6 (ref 5.0–8.0)

## 2022-05-09 LAB — URINALYSIS, MICROSCOPIC (REFLEX)

## 2022-05-09 LAB — TROPONIN I (HIGH SENSITIVITY): Troponin I (High Sensitivity): 7 ng/L (ref <18)

## 2022-05-09 NOTE — ED Triage Notes (Signed)
Pt here from home for bilateral lower back pain x3 days w/ urinary symptoms. Pt reports urine has been dark and fowl smelling, w/ dysuria. Pt reports she has felt more confused and forgetful recently. Has had intermittent episodes of shob. Pt is on 2L O2 at baseline. Had triple bypass in June, hx of UTIs. Pt reports feeling weak.

## 2022-05-09 NOTE — ED Provider Triage Note (Signed)
Emergency Medicine Provider Triage Evaluation Note  SARAY CAPASSO , a 61 y.o. female  was evaluated in triage.  Pt complains of chest pain, fatigue, foul-smelling urine, shortness of breath, and disorientation with mixing up her days.  Patient is status post CABG.  Review of Systems  Positive: As above Negative: As above  Physical Exam  There were no vitals taken for this visit. Gen:   Awake, no distress   Resp:  Normal effort  MSK:   Moves extremities without difficulty  Other:    Medical Decision Making  Medically screening exam initiated at 8:38 PM.  Appropriate orders placed.  PRESLY STEINRUCK was informed that the remainder of the evaluation will be completed by another provider, this initial triage assessment does not replace that evaluation, and the importance of remaining in the ED until their evaluation is complete.     Evlyn Courier, PA-C 05/09/22 2046

## 2022-05-10 ENCOUNTER — Emergency Department (HOSPITAL_COMMUNITY): Payer: Medicare Other

## 2022-05-10 ENCOUNTER — Inpatient Hospital Stay (HOSPITAL_COMMUNITY)
Admission: EM | Admit: 2022-05-10 | Discharge: 2022-05-17 | DRG: 291 | Disposition: A | Discharge status: Home Health | Payer: Medicare Other | Attending: Internal Medicine | Admitting: Internal Medicine

## 2022-05-10 ENCOUNTER — Other Ambulatory Visit: Payer: Self-pay

## 2022-05-10 ENCOUNTER — Encounter (HOSPITAL_COMMUNITY): Payer: Self-pay

## 2022-05-10 DIAGNOSIS — E119 Type 2 diabetes mellitus without complications: Secondary | ICD-10-CM

## 2022-05-10 DIAGNOSIS — Z940 Kidney transplant status: Secondary | ICD-10-CM

## 2022-05-10 DIAGNOSIS — N179 Acute kidney failure, unspecified: Secondary | ICD-10-CM | POA: Diagnosis present

## 2022-05-10 DIAGNOSIS — N3001 Acute cystitis with hematuria: Secondary | ICD-10-CM | POA: Diagnosis present

## 2022-05-10 DIAGNOSIS — Z9981 Dependence on supplemental oxygen: Secondary | ICD-10-CM

## 2022-05-10 DIAGNOSIS — I509 Heart failure, unspecified: Secondary | ICD-10-CM

## 2022-05-10 DIAGNOSIS — G8929 Other chronic pain: Secondary | ICD-10-CM | POA: Diagnosis present

## 2022-05-10 DIAGNOSIS — B961 Klebsiella pneumoniae [K. pneumoniae] as the cause of diseases classified elsewhere: Secondary | ICD-10-CM | POA: Diagnosis present

## 2022-05-10 DIAGNOSIS — I82511 Chronic embolism and thrombosis of right femoral vein: Secondary | ICD-10-CM | POA: Diagnosis present

## 2022-05-10 DIAGNOSIS — Z955 Presence of coronary angioplasty implant and graft: Secondary | ICD-10-CM

## 2022-05-10 DIAGNOSIS — Z951 Presence of aortocoronary bypass graft: Secondary | ICD-10-CM

## 2022-05-10 DIAGNOSIS — Z7960 Long term (current) use of unspecified immunomodulators and immunosuppressants: Secondary | ICD-10-CM

## 2022-05-10 DIAGNOSIS — E785 Hyperlipidemia, unspecified: Secondary | ICD-10-CM | POA: Diagnosis present

## 2022-05-10 DIAGNOSIS — Z6834 Body mass index (BMI) 34.0-34.9, adult: Secondary | ICD-10-CM

## 2022-05-10 DIAGNOSIS — Z833 Family history of diabetes mellitus: Secondary | ICD-10-CM

## 2022-05-10 DIAGNOSIS — J9621 Acute and chronic respiratory failure with hypoxia: Secondary | ICD-10-CM | POA: Diagnosis present

## 2022-05-10 DIAGNOSIS — Z79890 Hormone replacement therapy (postmenopausal): Secondary | ICD-10-CM

## 2022-05-10 DIAGNOSIS — E861 Hypovolemia: Secondary | ICD-10-CM | POA: Diagnosis present

## 2022-05-10 DIAGNOSIS — Z7982 Long term (current) use of aspirin: Secondary | ICD-10-CM

## 2022-05-10 DIAGNOSIS — E871 Hypo-osmolality and hyponatremia: Secondary | ICD-10-CM | POA: Diagnosis present

## 2022-05-10 DIAGNOSIS — K219 Gastro-esophageal reflux disease without esophagitis: Secondary | ICD-10-CM | POA: Diagnosis present

## 2022-05-10 DIAGNOSIS — E1151 Type 2 diabetes mellitus with diabetic peripheral angiopathy without gangrene: Secondary | ICD-10-CM | POA: Diagnosis present

## 2022-05-10 DIAGNOSIS — I10 Essential (primary) hypertension: Secondary | ICD-10-CM | POA: Diagnosis present

## 2022-05-10 DIAGNOSIS — E1142 Type 2 diabetes mellitus with diabetic polyneuropathy: Secondary | ICD-10-CM | POA: Diagnosis present

## 2022-05-10 DIAGNOSIS — D84821 Immunodeficiency due to drugs: Secondary | ICD-10-CM | POA: Diagnosis present

## 2022-05-10 DIAGNOSIS — J9601 Acute respiratory failure with hypoxia: Secondary | ICD-10-CM

## 2022-05-10 DIAGNOSIS — E11649 Type 2 diabetes mellitus with hypoglycemia without coma: Secondary | ICD-10-CM | POA: Diagnosis present

## 2022-05-10 DIAGNOSIS — Z794 Long term (current) use of insulin: Secondary | ICD-10-CM

## 2022-05-10 DIAGNOSIS — E039 Hypothyroidism, unspecified: Secondary | ICD-10-CM | POA: Diagnosis present

## 2022-05-10 DIAGNOSIS — R0789 Other chest pain: Secondary | ICD-10-CM | POA: Diagnosis present

## 2022-05-10 DIAGNOSIS — I5031 Acute diastolic (congestive) heart failure: Principal | ICD-10-CM

## 2022-05-10 DIAGNOSIS — Z79899 Other long term (current) drug therapy: Secondary | ICD-10-CM

## 2022-05-10 DIAGNOSIS — I132 Hypertensive heart and chronic kidney disease with heart failure and with stage 5 chronic kidney disease, or end stage renal disease: Principal | ICD-10-CM | POA: Diagnosis present

## 2022-05-10 DIAGNOSIS — Z7901 Long term (current) use of anticoagulants: Secondary | ICD-10-CM

## 2022-05-10 DIAGNOSIS — E1122 Type 2 diabetes mellitus with diabetic chronic kidney disease: Secondary | ICD-10-CM | POA: Diagnosis present

## 2022-05-10 DIAGNOSIS — I5033 Acute on chronic diastolic (congestive) heart failure: Secondary | ICD-10-CM | POA: Diagnosis present

## 2022-05-10 DIAGNOSIS — Z888 Allergy status to other drugs, medicaments and biological substances status: Secondary | ICD-10-CM

## 2022-05-10 DIAGNOSIS — Z8616 Personal history of COVID-19: Secondary | ICD-10-CM

## 2022-05-10 DIAGNOSIS — D573 Sickle-cell trait: Secondary | ICD-10-CM | POA: Diagnosis present

## 2022-05-10 DIAGNOSIS — R011 Cardiac murmur, unspecified: Secondary | ICD-10-CM | POA: Diagnosis present

## 2022-05-10 DIAGNOSIS — N186 End stage renal disease: Secondary | ICD-10-CM | POA: Diagnosis present

## 2022-05-10 DIAGNOSIS — I2510 Atherosclerotic heart disease of native coronary artery without angina pectoris: Secondary | ICD-10-CM | POA: Diagnosis present

## 2022-05-10 DIAGNOSIS — E1143 Type 2 diabetes mellitus with diabetic autonomic (poly)neuropathy: Secondary | ICD-10-CM | POA: Diagnosis present

## 2022-05-10 DIAGNOSIS — Z8249 Family history of ischemic heart disease and other diseases of the circulatory system: Secondary | ICD-10-CM

## 2022-05-10 DIAGNOSIS — K3184 Gastroparesis: Secondary | ICD-10-CM | POA: Diagnosis present

## 2022-05-10 DIAGNOSIS — D509 Iron deficiency anemia, unspecified: Secondary | ICD-10-CM | POA: Diagnosis present

## 2022-05-10 DIAGNOSIS — J9611 Chronic respiratory failure with hypoxia: Secondary | ICD-10-CM | POA: Diagnosis present

## 2022-05-10 DIAGNOSIS — Y830 Surgical operation with transplant of whole organ as the cause of abnormal reaction of the patient, or of later complication, without mention of misadventure at the time of the procedure: Secondary | ICD-10-CM | POA: Diagnosis present

## 2022-05-10 DIAGNOSIS — T8619 Other complication of kidney transplant: Secondary | ICD-10-CM | POA: Diagnosis present

## 2022-05-10 LAB — CBC
HCT: 34.6 % — ABNORMAL LOW (ref 36.0–46.0)
Hemoglobin: 10.4 g/dL — ABNORMAL LOW (ref 12.0–15.0)
MCH: 23.6 pg — ABNORMAL LOW (ref 26.0–34.0)
MCHC: 30.1 g/dL (ref 30.0–36.0)
MCV: 78.6 fL — ABNORMAL LOW (ref 80.0–100.0)
Platelets: 202 10*3/uL (ref 150–400)
RBC: 4.4 MIL/uL (ref 3.87–5.11)
RDW: 19.9 % — ABNORMAL HIGH (ref 11.5–15.5)
WBC: 4.4 10*3/uL (ref 4.0–10.5)
nRBC: 0 % (ref 0.0–0.2)

## 2022-05-10 LAB — BASIC METABOLIC PANEL
Anion gap: 10 (ref 5–15)
BUN: 12 mg/dL (ref 8–23)
CO2: 23 mmol/L (ref 22–32)
Calcium: 9.2 mg/dL (ref 8.9–10.3)
Chloride: 103 mmol/L (ref 98–111)
Creatinine, Ser: 0.84 mg/dL (ref 0.44–1.00)
GFR, Estimated: >60 mL/min (ref >60)
Glucose, Bld: 64 mg/dL — ABNORMAL LOW (ref 70–99)
Potassium: 3.6 mmol/L (ref 3.5–5.1)
Sodium: 136 mmol/L (ref 135–145)

## 2022-05-10 LAB — TROPONIN I (HIGH SENSITIVITY)
Troponin I (High Sensitivity): 6 ng/L (ref <18)
Troponin I (High Sensitivity): 6 ng/L (ref <18)

## 2022-05-10 LAB — CBG MONITORING, ED: Glucose-Capillary: 74 mg/dL (ref 70–99)

## 2022-05-10 MED ORDER — FLUCONAZOLE 150 MG PO TABS
150.0000 mg | ORAL_TABLET | Freq: Once | ORAL | Status: AC
  Administered 2022-05-10: 150 mg via ORAL
  Filled 2022-05-10: qty 1

## 2022-05-10 MED ORDER — CEPHALEXIN 500 MG PO CAPS: 500.0000 mg | ORAL_CAPSULE | Freq: Two times a day (BID) | ORAL | 0 refills | Status: DC

## 2022-05-10 MED ORDER — OXYCODONE-ACETAMINOPHEN 5-325 MG PO TABS
1.0000 | ORAL_TABLET | Freq: Once | ORAL | Status: AC
  Administered 2022-05-10: 1 via ORAL
  Filled 2022-05-10: qty 1

## 2022-05-10 MED ORDER — FUROSEMIDE 40 MG PO TABS: 40.0000 mg | ORAL_TABLET | Freq: Every day | ORAL | 0 refills | Status: DC

## 2022-05-10 MED ORDER — FLUCONAZOLE 150 MG PO TABS: 150.0000 mg | ORAL_TABLET | Freq: Once | ORAL | 0 refills | Status: DC

## 2022-05-10 MED ORDER — CEPHALEXIN 250 MG PO CAPS
500.0000 mg | ORAL_CAPSULE | Freq: Once | ORAL | Status: AC
  Administered 2022-05-10: 500 mg via ORAL
  Filled 2022-05-10: qty 2

## 2022-05-10 MED ORDER — ONDANSETRON 4 MG PO TBDP
4.0000 mg | ORAL_TABLET | Freq: Once | ORAL | Status: AC
  Administered 2022-05-10: 4 mg via ORAL
  Filled 2022-05-10: qty 1

## 2022-05-10 NOTE — ED Triage Notes (Signed)
Patient complains of SOB and is suppose to be 2L [place] and didn't not come in with it and sats were in the 70's.  Placed on 6L.  Patient was just here last night and left this am after evaluation for UTI  Also complains of chest pain.

## 2022-05-10 NOTE — Discharge Instructions (Addendum)
Start taking 40 mg of furosemide (lasix) daily today for swelling.  Please be sure to follow-up with your doctor in the next 3 to 7 days to have your kidney function rechecked.  Take the fluconazole (Diflucan) on Wednesday.  Get rechecked immediately if you have fevers, worsening breathing, new concerning symptoms.

## 2022-05-10 NOTE — ED Provider Notes (Signed)
[facility] EMERGENCY DEPARTMENT Provider Note   CSN: [id number] Arrival date & time: 05/09/22  2014     History  Chief Complaint  Patient presents with   Back Pain   Dysuria   Shortness of Breath    [name] is a 61 y.o. female.  The history is provided by the patient.  Back Pain Associated symptoms: dysuria   Dysuria Shortness of Breath [name] is a 61 y.o. female who presents to the Emergency Department complaining of uti.  She is concerned that she has a urinary tract infection because she has noticed that her urine is dark, smells poorly and she has pain across her low back.  She has associated dysuria for 3 days.  Symptoms are overall worsening.  She did have a temp to 99 today.  No associated nausea, vomiting.  She has occasional shortness of breath, none currently.  She has chronic central chest pain since her CABG in June.  She has a history of renal transplant in 2019, compliant with her suppressive medications.  No reports of injuries.  She has persistent lower extremity edema since her CABG but states that this is overall improving.  She is compliant with all of her home medications.  She lives in [place] but is currently staying with her son locally while she recovers from her surgery.    Home Medications Prior to Admission medications   Medication Sig Start Date End Date Taking? Authorizing Provider  cephALEXin (KEFLEX) 500 MG capsule Take 1 capsule (500 mg total) by mouth 2 (two) times daily. 05/10/22  Yes Quintella Reichert, MD  fluconazole (DIFLUCAN) 150 MG tablet Take 1 tablet (150 mg total) by mouth once for 1 dose. 05/10/22 05/10/22 Yes Quintella Reichert, MD  furosemide (LASIX) 40 MG tablet Take 1 tablet (40 mg total) by mouth daily. 05/10/22  Yes Quintella Reichert, MD  amitriptyline (ELAVIL) 10 MG tablet Take 10 mg by mouth daily. 04/07/20   [provider]  apixaban (ELIQUIS) 5 MG TABS tablet Take 5 mg by mouth 2 (two) times  daily. 07/21/21   [provider]  aspirin 81 MG chewable tablet Chew 81 mg by mouth daily.    [provider]  atorvastatin (LIPITOR) 10 MG tablet Take 10 mg by mouth daily. 12/16/21   [provider]  benzonatate (TESSALON) 200 MG capsule Take 1 capsule (200 mg total) by mouth 2 (two) times daily as needed for cough. Patient not taking: Reported on 04/26/2022 03/31/16   Theodis Blaze, MD  carvedilol (COREG) 25 MG tablet Take 25 mg by mouth 2 (two) times daily. 04/22/22   [provider]  cefUROXime (CEFTIN) 500 MG tablet Take 500 mg by mouth daily. 04/23/22   [provider]  cloNIDine (CATAPRES) 0.2 MG tablet Take 0.2 mg by mouth 2 (two) times daily.  Patient not taking: Reported on 04/26/2022    [provider]  cyclobenzaprine (FLEXERIL) 10 MG tablet Take 10 mg by mouth at bedtime as needed. 03/05/22   [provider]  diphenhydrAMINE (BENADRYL) 25 mg capsule Take 25 mg by mouth every 6 (six) hours as needed for allergies. Patient not taking: Reported on 04/26/2022    [provider]  esomeprazole (NEXIUM) 40 MG capsule Take 40 mg by mouth daily before breakfast. Patient not taking: Reported on 04/26/2022    [provider]  ferrous sulfate 325 (65 FE) MG tablet Take 325 mg by mouth daily. 04/23/22 05/23/22  [provider]  folic acid (FOLVITE) 1 MG tablet Take 1 mg by mouth daily. 04/24/22 05/24/22  [provider]  furosemide (LASIX) 20 MG tablet Take 20 mg by mouth daily. 04/23/22   [provider]  gabapentin (NEURONTIN) 300 MG capsule Take 300 mg by mouth 3 (three) times daily. 03/01/22   [provider]  icosapent Ethyl (VASCEPA) 1 g capsule Take 1 g by mouth daily.    [provider]  Insulin Glargine w/ Trans Port 100 UNIT/ML SOPN Inject 15 Units into the skin at bedtime. 01/17/20   [provider]  Insulin Lispro Prot & Lispro (HUMALOG MIX 75/25 KWIKPEN) (75-25) 100 UNIT/ML  Kwikpen Inject 20-30 Units into the skin 2 (two) times daily with a meal. 30 units in the morning and 0-20 units in the evening (depending on BGL) Patient not taking: Reported on 04/26/2022 03/31/16   Theodis Blaze, MD  isosorbide dinitrate (ISORDIL) 20 MG tablet Take 20 mg by mouth 3 (three) times daily. 03/03/22   [provider]  levothyroxine (SYNTHROID, LEVOTHROID) 125 MCG tablet Take 125 mcg by mouth daily before breakfast.     [provider]  linaclotide (LINZESS) 290 MCG CAPS capsule Take 290 mcg by mouth daily. 06/07/19   [provider]  methocarbamol (ROBAXIN) 500 MG tablet Take 1 tablet (500 mg total) by mouth at bedtime as needed for muscle spasms. Patient not taking: Reported on 04/26/2022 01/17/18   Caccavale, Sophia, PA-C  metoprolol succinate (TOPROL-XL) 100 MG 24 hr tablet Take 1 tablet (100 mg total) by mouth daily. Take with or immediately following a meal. 04/27/22   Johny Blamer, DO  mycophenolate (MYFORTIC) 360 MG TBEC EC tablet Take 360 mg by mouth daily. 12/26/18   [provider]  naproxen (NAPROSYN) 500 MG tablet Take 1 tablet (500 mg total) by mouth 2 (two) times daily with a meal. Patient not taking: Reported on 04/26/2022 01/17/18   Caccavale, Sophia, PA-C  NIFEdipine (PROCARDIA-XL/ADALAT CC) 60 MG 24 hr tablet Take 1 tablet (60 mg total) by mouth daily. 03/31/16   Theodis Blaze, MD  nitroGLYCERIN (NITROSTAT) 0.4 MG SL tablet Place 0.4 mg under the tongue as needed. 01/28/22   [provider]  NOVOLOG FLEXPEN 100 UNIT/ML FlexPen Inject 5-15 Units into the skin with breakfast, with lunch, and with evening meal. 01/22/22   [provider]  oxyCODONE-acetaminophen (PERCOCET) 10-325 MG tablet Take 1 tablet by mouth every 6 (six) hours as needed for pain. 03/31/16   Theodis Blaze, MD  pravastatin (PRAVACHOL) 40 MG tablet Take 40 mg by mouth at bedtime.  Patient not taking: Reported on 04/26/2022    [provider]  predniSONE  (DELTASONE) 5 MG tablet Take 5 mg by mouth daily. 05/26/21   [provider]  pregabalin (LYRICA) 75 MG capsule Take 75 mg by mouth 2 (two) times daily. Patient not taking: Reported on 04/26/2022    [provider]  Probiotic Product (ACIDOPHILUS HIGH-POTENCY) CAPS Take 175 mg by mouth daily. 04/24/22 05/24/22  [provider]  promethazine (PHENERGAN) 25 MG tablet Take 25 mg by mouth every 8 (eight) hours as needed for nausea or vomiting.     [provider]  RYBELSUS 7 MG TABS Take 1 tablet by mouth daily. 03/05/22   [provider]  sevelamer carbonate (RENVELA) 800 MG tablet Take 800-1,600 mg by mouth 3 (three) times daily with meals. Take 2 tablets (1600 mg) by mouth with meals, and 1 tablet (800  mg) with snacks    [provider]  sulfamethoxazole-trimethoprim (BACTRIM) 400-80 MG tablet Take 1 tablet by mouth See admin instructions. 1 tablet by mouth Monday/wednesdays/fridays 05/26/21   [provider]  tacrolimus ER (ENVARSUS XR) 1 MG TB24 Take 1 mg by mouth daily. 05/26/21   [provider]  VENTOLIN HFA 108 (90 Base) MCG/ACT inhaler Inhale 1 puff into the lungs every 4 (four) hours as needed. 02/03/22   [provider]      Allergies    Lisinopril and Hydralazine    Review of Systems   Review of Systems  Respiratory:  Positive for shortness of breath.   Genitourinary:  Positive for dysuria.  Musculoskeletal:  Positive for back pain.    Physical Exam Updated Vital Signs BP (!) 120/42 (BP Location: Right Arm)   Pulse 96   Temp 97.9 F (36.6 C) (Oral)   Resp 18   SpO2 95%  Physical Exam Vitals and nursing note reviewed.  Constitutional:      Appearance: She is well-developed.  HENT:     Head: Normocephalic and atraumatic.  Cardiovascular:     Rate and Rhythm: Normal rate and regular rhythm.     Heart sounds: No murmur heard. Pulmonary:     Effort: Pulmonary effort is normal. No respiratory distress.      Comments: Occasional crackle in the right lung base Abdominal:     Palpations: Abdomen is soft.     Tenderness: There is no abdominal tenderness. There is no guarding or rebound.  Musculoskeletal:        General: No tenderness.     Comments: 2+ pitting edema to BLE  Skin:    General: Skin is warm and dry.  Neurological:     Mental Status: She is alert and oriented to person, place, and time.     Comments: 5/5 strength in distal BLE.  Chronic hip pain limits proximal strength testing.  Psychiatric:        Behavior: Behavior normal.     ED Results / Procedures / Treatments   Labs (all labs ordered are listed, but only abnormal results are displayed) Labs Reviewed  CBC WITH DIFFERENTIAL/PLATELET - Abnormal; Notable for the following components:      Result Value   Hemoglobin 11.2 (*)    MCH 23.7 (*)    MCHC 28.9 (*)    RDW 20.6 (*)    All other components within normal limits  BASIC METABOLIC PANEL - Abnormal; Notable for the following components:   CO2 21 (*)    Glucose, Bld 146 (*)    Creatinine, Ser 1.02 (*)    All other components within normal limits  URINALYSIS, ROUTINE W REFLEX MICROSCOPIC - Abnormal; Notable for the following components:   Leukocytes,Ua SMALL (*)    All other components within normal limits  URINALYSIS, MICROSCOPIC (REFLEX) - Abnormal; Notable for the following components:   Bacteria, UA RARE (*)    All other components within normal limits  URINE CULTURE  TROPONIN I (HIGH SENSITIVITY)  TROPONIN I (HIGH SENSITIVITY)    EKG EKG Interpretation  Date/Time:  Monday May 10 2022 05:40:11 EDT Ventricular Rate:  88 PR Interval:  142 QRS Duration: 84 QT Interval:  356 QTC Calculation: 430 R Axis:   110 Text Interpretation: Normal sinus rhythm Left posterior fascicular block T wave abnormality, consider inferior ischemia Abnormal ECG Confirmed by Quintella Reichert [phone]) on 05/10/2022 5:46:22 AM  Radiology CT Renal Stone Study  Result Date:  05/10/2022  CLINICAL DATA:  Flank pain with kidney stone suspected EXAM: CT ABDOMEN AND PELVIS WITHOUT CONTRAST TECHNIQUE: Multidetector CT imaging of the abdomen and pelvis was performed following the standard protocol without IV contrast. RADIATION DOSE REDUCTION: This exam was performed according to the departmental dose-optimization program which includes automated exposure control, adjustment of the mA and/or kV according to patient size and/or use of iterative reconstruction technique. COMPARISON:  07/17/2012 FINDINGS: Lower chest: Interlobular septal thickening at the bases with atelectasis and small left pleural effusion. Cardiomegaly and extensive coronary atherosclerosis. Postoperative scarring adjacent to the sternum. Hepatobiliary: No focal liver abnormality.No evidence of biliary obstruction or stone. Pancreas: Generalized atrophy Spleen: Unremarkable. Adrenals/Urinary Tract: Negative adrenals. Advanced native renal atrophy with extensive arterial calcification. Transplant kidney in the right lower quadrant without hydronephrosis confusion, perinephric collection, or mass. Unremarkable bladder. Stomach/Bowel: No obstruction. No appendicitis. Stool distended proximal colon. Vascular/Lymphatic: Atheromatous calcification of the aorta and branch vessels, extensive. No mass or adenopathy. Reproductive:Hysterectomy Other: Negative for ascites or pneumoperitoneum.  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 No acute fracture. IMPRESSION: 1. No acute intra-abdominal finding. Unremarkable appearance of right lower quadrant transplant kidney. 2. Volume overload with pulmonary edema, anasarca and small left pleural effusion. 3. Atherosclerosis including the coronary arteries. Electronically Signed   By: Jorje Guild M.D.   On: 05/10/2022 07:23   DG Chest 2 View  Result Date: 05/09/2022 CLINICAL DATA:  Chest  pain. EXAM: CHEST - 2 VIEW COMPARISON:  Radiograph and CT 04/27/2019 FINDINGS: Median sternotomy. Stable cardiomegaly post CABG. Increased pulmonary edema from prior exam. Fluid in the fissures without large subpulmonic effusion. Linear atelectasis at the left lung base. No pneumothorax. No acute osseous findings. IMPRESSION: Increased pulmonary edema from prior exam. Fluid in the fissures. Stable cardiomegaly post CABG. Electronically Signed   By: Keith Rake M.D.   On: 05/09/2022 21:29    Procedures Procedures    Medications Ordered in ED Medications  cephALEXin (KEFLEX) capsule 500 mg (500 mg Oral Given 05/10/22 0532)  fluconazole (DIFLUCAN) tablet 150 mg (150 mg Oral Given 05/10/22 0532)  oxyCODONE-acetaminophen (PERCOCET/ROXICET) 5-325 MG per tablet 1 tablet (1 tablet Oral Given 05/10/22 0532)    ED Course/ Medical Decision Making/ A&P                           Medical Decision Making Amount and/or Complexity of Data Reviewed Labs: ordered. Radiology: ordered.  Risk Prescription drug management.   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    Labs significant for mild elevation in creatinine to 1.02.  Urinalysis is significant for few WBC, rare bacteria, yeast.  We will treat with Diflucan for yeast, this may be contributing to her symptoms.  She does have a history of recurrent UTIs and this feels similar to her priors, we will send cultures and treat for possible early lower urinary tract infection.  Given her back pain a CT stone study was obtained.  CT is negative for acute intra-abdominal pathology but does demonstrate evidence of volume overload.  Pt did comment on being short of breath in triage, states her breathing is significantly improved from recent hospitalization and only occasionally has  shortness of breath.  She does appear volume up on examination but states that this is  improved from her baseline.  On record review patient saw pulmonology on August 16 with a recommendation to increase her furosemide to 40 mg twice daily.  Patient has only been taking 20 mg daily.  She states that she cannot tolerate twice daily dosing due to sleep disruptions.  Do feel that she would benefit from short course of increased furosemide, will increase to 40 mg daily for the next week with PCP follow-up for repeat renal function and repeat evaluation in the next 3 to 7 days.  Plan to discharge home with close outpatient follow-up and return precautions.       Final Clinical Impression(s) / ED Diagnoses Final diagnoses:  Lower urinary tract infectious disease  Congestive heart failure, unspecified HF chronicity, unspecified heart failure type [facility])    Rx / DC Orders ED Discharge Orders          Ordered    fluconazole (DIFLUCAN) 150 MG tablet   Once        05/10/22 0516    furosemide (LASIX) 40 MG tablet  Daily        05/10/22 0720    cephALEXin (KEFLEX) 500 MG capsule  2 times daily        05/10/22 0720              Quintella Reichert, MD 05/10/22 [phone]

## 2022-05-10 NOTE — ED Provider Triage Note (Signed)
Emergency Medicine Provider Triage Evaluation Note  [name] , a 61 y.o. female  was evaluated in triage.  Pt complains of shortness of breath, chest pain, reports that she was discharged this morning after evaluation.  She recently had coronary bypass, has a history of CHF, is normally on 3 L oxygen at home.  She was supposed to have increased her Lasix but did not, she was discharged this morning with stable presentation, and encouraged to increase Lasix to normal dosage with close PCP recheck.  She is back today with ongoing shortness of breath.  Her oxygen saturation on initial evaluation in triage was 74 with no oxygen, she did increase to 97 after 4 liters nasal cannula.  Review of Systems  Positive: Shortness of breath, chest pain Negative: Nausea, vomiting  Physical Exam  There were no vitals taken for this visit. Gen:   Awake, no distress   Resp:  Normal effort  MSK:   Moves extremities without difficulty  Other:    Medical Decision Making  Medically screening exam initiated at 3:19 PM.  Appropriate orders placed.  MILLETTE HALBERSTAM was informed that the remainder of the evaluation will be completed by another provider, this initial triage assessment does not replace that evaluation, and the importance of remaining in the ED until their evaluation is complete.  Chest pain, shortness of breath, charge nurse informed that patient should have room sooner rather than later due to hypoxia in triage   Anselmo Pickler, PA-C 05/10/22 1521

## 2022-05-11 ENCOUNTER — Observation Stay (HOSPITAL_COMMUNITY): Payer: Medicare Other

## 2022-05-11 DIAGNOSIS — I2510 Atherosclerotic heart disease of native coronary artery without angina pectoris: Secondary | ICD-10-CM | POA: Diagnosis not present

## 2022-05-11 DIAGNOSIS — Z87440 Personal history of urinary (tract) infections: Secondary | ICD-10-CM

## 2022-05-11 DIAGNOSIS — Z86718 Personal history of other venous thrombosis and embolism: Secondary | ICD-10-CM

## 2022-05-11 DIAGNOSIS — E1140 Type 2 diabetes mellitus with diabetic neuropathy, unspecified: Secondary | ICD-10-CM

## 2022-05-11 DIAGNOSIS — N3001 Acute cystitis with hematuria: Secondary | ICD-10-CM | POA: Diagnosis present

## 2022-05-11 DIAGNOSIS — Z992 Dependence on renal dialysis: Secondary | ICD-10-CM

## 2022-05-11 DIAGNOSIS — J9621 Acute and chronic respiratory failure with hypoxia: Secondary | ICD-10-CM | POA: Diagnosis not present

## 2022-05-11 DIAGNOSIS — I5033 Acute on chronic diastolic (congestive) heart failure: Secondary | ICD-10-CM | POA: Diagnosis not present

## 2022-05-11 DIAGNOSIS — I5031 Acute diastolic (congestive) heart failure: Principal | ICD-10-CM

## 2022-05-11 DIAGNOSIS — N186 End stage renal disease: Secondary | ICD-10-CM

## 2022-05-11 DIAGNOSIS — Z87891 Personal history of nicotine dependence: Secondary | ICD-10-CM

## 2022-05-11 DIAGNOSIS — D649 Anemia, unspecified: Secondary | ICD-10-CM

## 2022-05-11 DIAGNOSIS — E1122 Type 2 diabetes mellitus with diabetic chronic kidney disease: Secondary | ICD-10-CM

## 2022-05-11 DIAGNOSIS — Z951 Presence of aortocoronary bypass graft: Secondary | ICD-10-CM

## 2022-05-11 DIAGNOSIS — E039 Hypothyroidism, unspecified: Secondary | ICD-10-CM

## 2022-05-11 LAB — BASIC METABOLIC PANEL
Anion gap: 8 (ref 5–15)
Anion gap: 9 (ref 5–15)
BUN: 11 mg/dL (ref 8–23)
BUN: 9 mg/dL (ref 8–23)
CO2: 21 mmol/L — ABNORMAL LOW (ref 22–32)
CO2: 21 mmol/L — ABNORMAL LOW (ref 22–32)
Calcium: 8.4 mg/dL — ABNORMAL LOW (ref 8.9–10.3)
Calcium: 7.1 mg/dL — ABNORMAL LOW (ref 8.9–10.3)
Chloride: 103 mmol/L (ref 98–111)
Chloride: 104 mmol/L (ref 98–111)
Creatinine, Ser: 0.89 mg/dL (ref 0.44–1.00)
Creatinine, Ser: 0.68 mg/dL (ref 0.44–1.00)
GFR, Estimated: >60 mL/min (ref >60)
GFR, Estimated: >60 mL/min (ref >60)
Glucose, Bld: 130 mg/dL — ABNORMAL HIGH (ref 70–99)
Glucose, Bld: 157 mg/dL — ABNORMAL HIGH (ref 70–99)
Potassium: 5.2 mmol/L — ABNORMAL HIGH (ref 3.5–5.1)
Potassium: 3.6 mmol/L (ref 3.5–5.1)
Sodium: 132 mmol/L — ABNORMAL LOW (ref 135–145)
Sodium: 134 mmol/L — ABNORMAL LOW (ref 135–145)

## 2022-05-11 LAB — FERRITIN: Ferritin: 534 ng/mL — ABNORMAL HIGH (ref 11–307)

## 2022-05-11 LAB — URINALYSIS, ROUTINE W REFLEX MICROSCOPIC
Bilirubin Urine: NEGATIVE
Glucose, UA: NEGATIVE mg/dL
Hgb urine dipstick: NEGATIVE
Ketones, ur: NEGATIVE mg/dL
Leukocytes,Ua: NEGATIVE
Nitrite: NEGATIVE
Protein, ur: NEGATIVE mg/dL
Specific Gravity, Urine: 1.01 (ref 1.005–1.030)
pH: 6 (ref 5.0–8.0)

## 2022-05-11 LAB — CBC
HCT: 38.2 % (ref 36.0–46.0)
Hemoglobin: 10.7 g/dL — ABNORMAL LOW (ref 12.0–15.0)
MCH: 23.4 pg — ABNORMAL LOW (ref 26.0–34.0)
MCHC: 28 g/dL — ABNORMAL LOW (ref 30.0–36.0)
MCV: 83.6 fL (ref 80.0–100.0)
Platelets: 184 10*3/uL (ref 150–400)
RBC: 4.57 MIL/uL (ref 3.87–5.11)
RDW: 20.7 % — ABNORMAL HIGH (ref 11.5–15.5)
WBC: 3.9 10*3/uL — ABNORMAL LOW (ref 4.0–10.5)
nRBC: 0 % (ref 0.0–0.2)

## 2022-05-11 LAB — CBG MONITORING, ED
Glucose-Capillary: 127 mg/dL — ABNORMAL HIGH (ref 70–99)
Glucose-Capillary: 162 mg/dL — ABNORMAL HIGH (ref 70–99)

## 2022-05-11 LAB — IRON AND TIBC
Iron: 26 ug/dL — ABNORMAL LOW (ref 28–170)
Saturation Ratios: 11 % (ref 10.4–31.8)
TIBC: 239 ug/dL — ABNORMAL LOW (ref 250–450)
UIBC: 213 ug/dL

## 2022-05-11 LAB — FOLATE: Folate: >40 ng/mL (ref >5.9)

## 2022-05-11 LAB — VITAMIN B12: Vitamin B-12: 599 pg/mL (ref 180–914)

## 2022-05-11 LAB — MAGNESIUM: Magnesium: 1.6 mg/dL — ABNORMAL LOW (ref 1.7–2.4)

## 2022-05-11 LAB — GLUCOSE, CAPILLARY
Glucose-Capillary: 212 mg/dL — ABNORMAL HIGH (ref 70–99)
Glucose-Capillary: 255 mg/dL — ABNORMAL HIGH (ref 70–99)

## 2022-05-11 LAB — BRAIN NATRIURETIC PEPTIDE: B Natriuretic Peptide: 600.6 pg/mL — ABNORMAL HIGH (ref 0.0–100.0)

## 2022-05-11 MED ORDER — FERROUS SULFATE 325 (65 FE) MG PO TABS
325.0000 mg | ORAL_TABLET | Freq: Every day | ORAL | Status: DC
  Administered 2022-05-11 – 2022-05-17 (×7): 325 mg via ORAL
  Filled 2022-05-11 (×7): qty 1

## 2022-05-11 MED ORDER — ATORVASTATIN CALCIUM 10 MG PO TABS
10.0000 mg | ORAL_TABLET | Freq: Every day | ORAL | Status: DC
  Administered 2022-05-11 – 2022-05-16 (×6): 10 mg via ORAL
  Filled 2022-05-11 (×6): qty 1

## 2022-05-11 MED ORDER — FUROSEMIDE 10 MG/ML IJ SOLN
40.0000 mg | Freq: Once | INTRAMUSCULAR | Status: AC
  Administered 2022-05-11: 40 mg via INTRAVENOUS
  Filled 2022-05-11: qty 4

## 2022-05-11 MED ORDER — FOLIC ACID 1 MG PO TABS
1.0000 mg | ORAL_TABLET | Freq: Every day | ORAL | Status: DC
  Administered 2022-05-11 – 2022-05-17 (×7): 1 mg via ORAL
  Filled 2022-05-11 (×7): qty 1

## 2022-05-11 MED ORDER — APIXABAN 5 MG PO TABS
5.0000 mg | ORAL_TABLET | Freq: Two times a day (BID) | ORAL | Status: DC
  Administered 2022-05-11 – 2022-05-17 (×13): 5 mg via ORAL
  Filled 2022-05-11 (×13): qty 1

## 2022-05-11 MED ORDER — CARVEDILOL 12.5 MG PO TABS
12.5000 mg | ORAL_TABLET | Freq: Two times a day (BID) | ORAL | Status: DC
  Administered 2022-05-11 – 2022-05-17 (×12): 12.5 mg via ORAL
  Filled 2022-05-11 (×14): qty 1

## 2022-05-11 MED ORDER — INSULIN GLARGINE-YFGN 100 UNIT/ML ~~LOC~~ SOLN
15.0000 [IU] | Freq: Every day | SUBCUTANEOUS | Status: DC
Start: 2022-05-11 — End: 2022-05-11

## 2022-05-11 MED ORDER — LINACLOTIDE 145 MCG PO CAPS
290.0000 ug | ORAL_CAPSULE | Freq: Every day | ORAL | Status: DC
  Administered 2022-05-11 – 2022-05-17 (×7): 290 ug via ORAL
  Filled 2022-05-11 (×8): qty 2

## 2022-05-11 MED ORDER — MYCOPHENOLATE SODIUM 180 MG PO TBEC
360.0000 mg | DELAYED_RELEASE_TABLET | Freq: Two times a day (BID) | ORAL | Status: DC
  Administered 2022-05-11 – 2022-05-17 (×13): 360 mg via ORAL
  Filled 2022-05-11 (×14): qty 2

## 2022-05-11 MED ORDER — AMITRIPTYLINE HCL 10 MG PO TABS
10.0000 mg | ORAL_TABLET | Freq: Every day | ORAL | Status: DC
  Administered 2022-05-11 – 2022-05-12 (×2): 10 mg via ORAL
  Filled 2022-05-11 (×2): qty 1

## 2022-05-11 MED ORDER — SENNOSIDES-DOCUSATE SODIUM 8.6-50 MG PO TABS
2.0000 | ORAL_TABLET | Freq: Every day | ORAL | Status: DC
  Administered 2022-05-11 – 2022-05-16 (×6): 2 via ORAL
  Filled 2022-05-11 (×6): qty 2

## 2022-05-11 MED ORDER — PROMETHAZINE HCL 25 MG PO TABS
25.0000 mg | ORAL_TABLET | Freq: Three times a day (TID) | ORAL | Status: DC | PRN
  Administered 2022-05-11: 25 mg via ORAL
  Filled 2022-05-11: qty 1

## 2022-05-11 MED ORDER — NA FERRIC GLUC CPLX IN SUCROSE 12.5 MG/ML IV SOLN
250.0000 mg | Freq: Once | INTRAVENOUS | Status: AC
  Administered 2022-05-11: 250 mg via INTRAVENOUS
  Filled 2022-05-11: qty 20

## 2022-05-11 MED ORDER — ACETAMINOPHEN 325 MG PO TABS: 650.0000 mg | ORAL_TABLET | Freq: Four times a day (QID) | ORAL | Status: DC | PRN

## 2022-05-11 MED ORDER — OXYCODONE HCL 5 MG PO TABS: 5.0000 mg | ORAL_TABLET | ORAL | Status: DC | PRN

## 2022-05-11 MED ORDER — FUROSEMIDE 10 MG/ML IJ SOLN
40.0000 mg | Freq: Two times a day (BID) | INTRAMUSCULAR | Status: DC
  Administered 2022-05-12: 40 mg via INTRAVENOUS
  Filled 2022-05-11: qty 4

## 2022-05-11 MED ORDER — GABAPENTIN 300 MG PO CAPS
300.0000 mg | ORAL_CAPSULE | Freq: Three times a day (TID) | ORAL | Status: DC
  Administered 2022-05-11 – 2022-05-17 (×19): 300 mg via ORAL
  Filled 2022-05-11 (×19): qty 1

## 2022-05-11 MED ORDER — ISOSORBIDE DINITRATE 10 MG PO TABS: 20.0000 mg | ORAL_TABLET | Freq: Three times a day (TID) | ORAL | Status: DC

## 2022-05-11 MED ORDER — CARVEDILOL 12.5 MG PO TABS: 25.0000 mg | ORAL_TABLET | Freq: Two times a day (BID) | ORAL | Status: DC

## 2022-05-11 MED ORDER — NIFEDIPINE ER OSMOTIC RELEASE 60 MG PO TB24: 60.0000 mg | ORAL_TABLET | Freq: Every day | ORAL | Status: DC

## 2022-05-11 MED ORDER — ACETAMINOPHEN 650 MG RE SUPP: 650.0000 mg | Freq: Four times a day (QID) | RECTAL | Status: DC | PRN

## 2022-05-11 MED ORDER — LEVOTHYROXINE SODIUM 25 MCG PO TABS
125.0000 ug | ORAL_TABLET | Freq: Every day | ORAL | Status: DC
  Administered 2022-05-11 – 2022-05-12 (×2): 125 ug via ORAL
  Filled 2022-05-11 (×2): qty 1

## 2022-05-11 MED ORDER — ALBUTEROL SULFATE (2.5 MG/3ML) 0.083% IN NEBU: 2.5000 mg | INHALATION_SOLUTION | RESPIRATORY_TRACT | Status: DC | PRN

## 2022-05-11 MED ORDER — MAGNESIUM SULFATE 2 GM/50ML IV SOLN
2.0000 g | Freq: Once | INTRAVENOUS | Status: AC
  Administered 2022-05-11: 2 g via INTRAVENOUS
  Filled 2022-05-11: qty 50

## 2022-05-11 MED ORDER — IPRATROPIUM-ALBUTEROL 0.5-2.5 (3) MG/3ML IN SOLN
3.0000 mL | Freq: Once | RESPIRATORY_TRACT | Status: AC
Start: 2022-05-11 — End: 2022-05-11
  Administered 2022-05-11: 3 mL via RESPIRATORY_TRACT
  Filled 2022-05-11: qty 3

## 2022-05-11 MED ORDER — INSULIN ASPART 100 UNIT/ML IJ SOLN
0.0000 [IU] | Freq: Every day | INTRAMUSCULAR | Status: DC
  Administered 2022-05-11: 3 [IU] via SUBCUTANEOUS

## 2022-05-11 MED ORDER — TACROLIMUS ER 4 MG PO TB24
6.0000 mg | ORAL_TABLET | Freq: Every day | ORAL | Status: DC
  Administered 2022-05-11 – 2022-05-17 (×7): 6 mg via ORAL
  Filled 2022-05-11 (×7): qty 2
  Filled 2022-05-11: qty 6

## 2022-05-11 MED ORDER — ASPIRIN 81 MG PO CHEW
81.0000 mg | CHEWABLE_TABLET | Freq: Every day | ORAL | Status: DC
  Administered 2022-05-11 – 2022-05-17 (×7): 81 mg via ORAL
  Filled 2022-05-11 (×7): qty 1

## 2022-05-11 MED ORDER — IOHEXOL 350 MG/ML SOLN
54.0000 mL | Freq: Once | INTRAVENOUS | Status: AC | PRN
  Administered 2022-05-11: 54 mL via INTRAVENOUS

## 2022-05-11 MED ORDER — OXYCODONE HCL 5 MG PO TABS
7.5000 mg | ORAL_TABLET | Freq: Four times a day (QID) | ORAL | Status: DC | PRN
  Administered 2022-05-11 – 2022-05-17 (×18): 7.5 mg via ORAL
  Filled 2022-05-11 (×19): qty 2

## 2022-05-11 MED ORDER — PREDNISONE 5 MG PO TABS
5.0000 mg | ORAL_TABLET | Freq: Every day | ORAL | Status: DC
  Administered 2022-05-11 – 2022-05-17 (×7): 5 mg via ORAL
  Filled 2022-05-11 (×7): qty 1

## 2022-05-11 MED ORDER — CYCLOBENZAPRINE HCL 10 MG PO TABS
10.0000 mg | ORAL_TABLET | Freq: Every evening | ORAL | Status: DC | PRN
Start: 2022-05-11 — End: 2022-05-17
  Administered 2022-05-12 – 2022-05-17 (×5): 10 mg via ORAL
  Filled 2022-05-11 (×5): qty 1

## 2022-05-11 NOTE — Evaluation (Signed)
Physical Therapy Evaluation Patient Details Name: [name] MRN: [id number] DOB: May 04, 1961 Today's Date: 05/11/2022  History of Present Illness  Pt is a 61 y/o female admitted secondary to worsening SOB. Thought to be secondary to CHF exacerbation. PMH includes CAD s/p CABG, PAD, COVID, ESRD s/p renal transplant, DM, and CHF.  Clinical Impression  Pt admitted secondary to problem above with deficits below. Pt requiring min guard to supervision for mobility tasks. Oxygen decreased to 85% on 6L and required seated rest to return to 92% on 6L. Pt reports she had one session of HHPT prior to returning to the hospital. Recommend resumption of HHPT at d/c. Will continue to follow acutely.       Recommendations for follow up therapy are one component of a multi-disciplinary discharge planning process, led by the attending physician.  Recommendations may be updated based on patient status, additional functional criteria and insurance authorization.  Follow Up Recommendations Home health PT      Assistance Recommended at Discharge PRN  Patient can return home with the following  Help with stairs or ramp for entrance;Assistance with cooking/housework    Equipment Recommendations None recommended by PT  Recommendations for Other Services       Functional Status Assessment Patient has had a recent decline in their functional status and demonstrates the ability to make significant improvements in function in a reasonable and predictable amount of time.     Precautions / Restrictions Precautions Precautions: Fall Restrictions Weight Bearing Restrictions: No      Mobility  Bed Mobility Overal bed mobility: Modified Independent                  Transfers Overall transfer level: Needs assistance Equipment used: None Transfers: Sit to/from Stand Sit to Stand: Supervision           General transfer comment: supervision for safety and line management     Ambulation/Gait Ambulation/Gait assistance: Min guard Gait Distance (Feet): 10 Feet Assistive device: None Gait Pattern/deviations: Step-through pattern, Decreased stride length Gait velocity: decreased     General Gait Details: Slow, waddle type gait. Min guard for safety. Pt in ED with no portable tank so mobility limited to within the room. Oxygen sats decreased to 85% on 6L and required seated rest to return to 92% on 6L  Stairs            Wheelchair Mobility    Modified Rankin (Stroke Patients Only)       Balance Overall balance assessment: Mild deficits observed, not formally tested                                           Pertinent Vitals/Pain Pain Assessment Pain Assessment: 0-10 Pain Score: 7  Pain Location: chest and back Pain Descriptors / Indicators: Grimacing, Guarding Pain Intervention(s): Limited activity within patient's tolerance, Monitored during session, Repositioned    Home Living Family/patient expects to be discharged to:: Private residence Living Arrangements: Children Available Help at Discharge: Family Type of Home: House Home Access: Stairs to enter Entrance Stairs-Rails: Psychiatric nurse of Steps: 5   Home Layout: Two level;Able to live on main level with bedroom/bathroom Home Equipment: Rolling Walker (2 wheels);BSC/3in1;Shower seat Additional Comments: Living at her sons home right now following CABG in June    Prior Function Prior Level of Function : Independent/Modified Independent  Mobility Comments: Using RW for mobility.       Hand Dominance   Dominant Hand: Right    Extremity/Trunk Assessment   Upper Extremity Assessment Upper Extremity Assessment: Overall WFL for tasks assessed    Lower Extremity Assessment Lower Extremity Assessment: Generalized weakness    Cervical / Trunk Assessment Cervical / Trunk Assessment: Kyphotic  Communication   Communication:  No difficulties  Cognition Arousal/Alertness: Awake/alert Behavior During Therapy: WFL for tasks assessed/performed Overall Cognitive Status: Within Functional Limits for tasks assessed                                          General Comments      Exercises     Assessment/Plan    PT Assessment Patient needs continued PT services  PT Problem List Decreased strength;Decreased activity tolerance;Decreased mobility;Cardiopulmonary status limiting activity       PT Treatment Interventions DME instruction;Gait training;Stair training;Functional mobility training;Therapeutic exercise;Therapeutic activities;Balance training;Patient/family education    PT Goals (Current goals can be found in the Care Plan section)  Acute Rehab PT Goals Patient Stated Goal: to get CHF symptoms figured out PT Goal Formulation: With patient Time For Goal Achievement: 05/25/22 Potential to Achieve Goals: Good    Frequency Min 3X/week     Co-evaluation               AM-PAC PT "6 Clicks" Mobility  Outcome Measure Help needed turning from your back to your side while in a flat bed without using bedrails?: None Help needed moving from lying on your back to sitting on the side of a flat bed without using bedrails?: None Help needed moving to and from a bed to a chair (including a wheelchair)?: None Help needed standing up from a chair using your arms (e.g., wheelchair or bedside chair)?: None Help needed to walk in hospital room?: A Little Help needed climbing 3-5 steps with a railing? : A Little 6 Click Score: 22    End of Session Equipment Utilized During Treatment: Oxygen Activity Tolerance: Patient tolerated treatment well Patient left: in bed;with call bell/phone within reach (on stretcher in ED) Nurse Communication: Mobility status PT Visit Diagnosis: Other abnormalities of gait and mobility (R26.89);Muscle weakness (generalized) (M62.81)    Time: 1660-6301 PT Time  Calculation (min) (ACUTE ONLY): 14 min   Charges:   PT Evaluation $PT Eval Low Complexity: 1 Low          [name], PT, DPT  Acute Rehabilitation Services  Office: [phone]   [name] 05/11/2022, 12:10 PM

## 2022-05-11 NOTE — ED Notes (Signed)
Pt oxygen was 86% on 3 liters.  I bump pt oxygen up to 6 liters. Pt said she does not want her oxygen on 6 liters because nose will start bleeding. Pt turned oxygen down to 3 liters   Pt oxygen went up 96% on 6 liters.

## 2022-05-11 NOTE — ED Notes (Addendum)
Pt states she is feeling nauseated at this time - PRN to be given

## 2022-05-11 NOTE — ED Notes (Signed)
Pt received breakfast 

## 2022-05-11 NOTE — TOC Progression Note (Signed)
Transition of Care [facility]) - Progression Note    Patient Details  Name: CAMBRYN CHARTERS MRN: [id number] Date of Birth: 05-01-1961  [facility] CM/SW Contact  Zenon Mayo, RN Phone Number: 05/11/2022, 4:43 PM  Clinical Narrative:    She is active with [facility] for [place].         Expected Discharge Plan and Services                                                 Social Determinants of Health (SDOH) Interventions    Readmission Risk Interventions     No data to display

## 2022-05-11 NOTE — Hospital Course (Addendum)
[name] is a 61 year old female with a past medical history of CAD s/p CABG on 03/16/2022, chronic respiratory failure secondary to COVID-pneumonia in 2020 on 3 L O2 at home, ESRD s/p transplant on immunosuppression, HFpEF, prior DVT on Eliquis, T2DM, and HLD who presented with worsening dyspnea and is admitted for acute heart failure exacerbation.  Acute exacerbation of HFpEF NYHA stage D class III Acute on chronic hypoxic respiratory failure 2/2 COVID-pneumonia in 2020 Patient was recently discharged after an acute exacerbation of her heart failure on 04/27/2022.  She had originally presented after taking Lasix 20 mg every other day which appeared to be inadequate diuresis as she developed worsening edema and dyspnea.  TTE at that time showed an EF of 60 to 65% and mild LVH but otherwise showed no significant abnormalities.  She was discharged with a prescription for Lasix 20 mg daily.  She then saw pulmonology who recommended she increase her dose to 40 mg twice daily 4 weeks.  They noted that she was prescribed 40 mg daily at that time.  There is no documentation available to [place] that indicates the change from 20 daily to 40 daily.  She was not able to pick up her prescription for the increased dosing and then presented to the ED yesterday initially for back pain and dysuria.  She states after being discharged from the ED she had acute onset shortness of breath which was mostly characterized by an inability to take deep breaths.  She tried rest and albuterol inhaler without relief.  She then returned to the hospital.  Evaluation so far shown BNP of 600, negative troponin X3, low iron, chest x-ray significant for pulmonary edema.  She responded well to IV Lasix 40 mg and we continued with 40 mg IV BID.  She had increased O2 needs and was on 6 L nasal cannula on admission, up from home 3 L.  EKG was significant for changes which include signs of right heart strain.  CTA PE protocol showed no evidence of  acute PE.  She continued to respond well to IV diuresis and was near her dry weight prior to discharge.  IV iron infusion was given with her iron studies as below. She was discharged with prescription for Lasix 40 mg daily.   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  She is on chronic Eliquis which she has taken consistently and has not run out of.  There are EKG findings as above which are concerning for possible PE.  With her DVT history we obtained a CT PE protocol which showed no acute PE.  She showed no signs changes consistent with pulmonary embolism.  We continued her home dose Eliquis 5 mg twice a day.    Recurrent UTI She has had recurrent UTIs after renal transplant.  She presented to the emergency department several hours prior to returning for this admission with complaints of back pain and dysuria. Urinalysis showed rare bacteria, few leukocytes, and budding yeast.  She was given a dose of Diflucan and Keflex before being discharged from the emergency department.  Urine culture grew Klebsiella pneumoniae. She then returned with increasing dyspnea and continued dysuria.  We repeated the UA and got a urine culture.  UA here is clean and culture was ***. Chart review showed a culture on 04/19/2022 grew  Klebsiella pneumonia with resistance to Bactrim, nitrofurantoin, and ampicillin but sensitivity to cephalosporins.  We started her on cefdinir 300 mg twice a day for 7-day course.  She will continue her Bactrim as below with her immunosuppressive therapy.   CAD s/p CABG Hypertension Hyperlipidemia CABG x 3 (LIMA to LAD, SVG to OM, SVG to PDA) on 03/16/2022.  No signs of ACS, stable atypical chest pain worked up at last admission.  Continued home medications of nifedipine 60 mg daily, Coreg 12.5 mg twice daily,  atorvastatin 10 mg daily, aspirin 81 mg daily.   ESRD s/p renal transplant AKI ESRD due to diabetic neuropathy. Creatinine 1.02 on admission down to 0.68 and stable at 0.63.  AKI likely secondary to intravascular hypovolemia and poor p.o. intake.  Improved with IV diuresis and increased p.o. intake.  We continued her immunosuppressive therapy with tacrolimus, mycophenolate, Bactrim MWF, and prednisone.   T2DM with neuropathy A1c of 6.7% on 03/10/2022.  Home regimen of glargine 15 units daily and sliding scale.  In setting of low sugars on arrival we held her long-acting insulin.  After she was able to eat more we will restart her home insulin regimen.  We will continue her home regimen of gabapentin 300 mg 3 times daily, Percocet 7.5, and Flexeril 10 mg as needed   Normocytic anemia Mild leukopenia Hemoglobin of 10.7 and leukocytes at 3.9.  Likely secondary to malnutrition, chronic diseases, and immunosuppressive therapy.  Iron of 26, saturation of 11, ferritin of 534.  She was given 2 rounds of IV iron and the continued oral iron supplementation.   Hypothyroidism Continued home levothyroxine 100 mcg daily.  TSH 5.9 likely elevated in the setting of acute illness.  ?Torsemide at dc BP good

## 2022-05-11 NOTE — ED Notes (Signed)
Dr. Carter at bedside.

## 2022-05-11 NOTE — ED Notes (Signed)
Pt transported to CT then to room 3E11 with all belongings.

## 2022-05-11 NOTE — ED Provider Notes (Signed)
[facility] EMERGENCY DEPARTMENT Provider Note   CSN: [id number] Arrival date & time: 05/10/22  1510     History  Chief Complaint  Patient presents with   Shortness of Breath    [name] is a 61 y.o. female.  The history is provided by the patient.  Shortness of Breath Associated symptoms: no cough and no fever    Patient with history of a stage renal disease s/p transplant, CAD with recent CABG, postinfectious pulmonary fibrosis, heart failure with preserved EF presents with shortness of breath.  Patient was just recently discharged from the ER and was advised to increase her Lasix.  However after getting home she began having increasing shortness of breath.  Denies any significant worsening lower extremity edema.  She reports feeling breathless with movement.  She has a history of chronic respiratory failure and is on 3 L nasal cannula at all times  He is a non-smoker.  No active chest pain.  She is overall medication compliant  Home Medications Prior to Admission medications   Medication Sig Start Date End Date Taking? Authorizing Provider  amitriptyline (ELAVIL) 10 MG tablet Take 10 mg by mouth daily. 04/07/20   [provider]  apixaban (ELIQUIS) 5 MG TABS tablet Take 5 mg by mouth 2 (two) times daily. 07/21/21   [provider]  aspirin 81 MG chewable tablet Chew 81 mg by mouth daily.    [provider]  atorvastatin (LIPITOR) 10 MG tablet Take 10 mg by mouth daily. 12/16/21   [provider]  benzonatate (TESSALON) 200 MG capsule Take 1 capsule (200 mg total) by mouth 2 (two) times daily as needed for cough. Patient not taking: Reported on 04/26/2022 03/31/16   Theodis Blaze, MD  carvedilol (COREG) 25 MG tablet Take 25 mg by mouth 2 (two) times daily. 04/22/22   [provider]  cefUROXime (CEFTIN) 500 MG tablet Take 500 mg by mouth daily. 04/23/22   [provider]  cephALEXin (KEFLEX) 500 MG capsule Take  1 capsule (500 mg total) by mouth 2 (two) times daily. 05/10/22   Quintella Reichert, MD  cloNIDine (CATAPRES) 0.2 MG tablet Take 0.2 mg by mouth 2 (two) times daily.  Patient not taking: Reported on 04/26/2022    [provider]  cyclobenzaprine (FLEXERIL) 10 MG tablet Take 10 mg by mouth at bedtime as needed. 03/05/22   [provider]  diphenhydrAMINE (BENADRYL) 25 mg capsule Take 25 mg by mouth every 6 (six) hours as needed for allergies. Patient not taking: Reported on 04/26/2022    [provider]  esomeprazole (NEXIUM) 40 MG capsule Take 40 mg by mouth daily before breakfast. Patient not taking: Reported on 04/26/2022    [provider]  ferrous sulfate 325 (65 FE) MG tablet Take 325 mg by mouth daily. 04/23/22 05/23/22  [provider]  folic acid (FOLVITE) 1 MG tablet Take 1 mg by mouth daily. 04/24/22 05/24/22  [provider]  furosemide (LASIX) 20 MG tablet Take 20 mg by mouth daily. 04/23/22   [provider]  furosemide (LASIX) 40 MG tablet Take 1 tablet (40 mg total) by mouth daily. 05/10/22   Quintella Reichert, MD  gabapentin (NEURONTIN) 300 MG capsule Take 300 mg by mouth 3 (three) times daily. 03/01/22   [provider]  icosapent Ethyl (VASCEPA) 1 g capsule Take 1 g by mouth daily.    [provider]  Insulin Glargine w/ Trans Port 100 UNIT/ML SOPN  Inject 15 Units into the skin at bedtime. 01/17/20   [provider]  Insulin Lispro Prot & Lispro (HUMALOG MIX 75/25 KWIKPEN) (75-25) 100 UNIT/ML Kwikpen Inject 20-30 Units into the skin 2 (two) times daily with a meal. 30 units in the morning and 0-20 units in the evening (depending on BGL) Patient not taking: Reported on 04/26/2022 03/31/16   Theodis Blaze, MD  isosorbide dinitrate (ISORDIL) 20 MG tablet Take 20 mg by mouth 3 (three) times daily. 03/03/22   [provider]  levothyroxine (SYNTHROID, LEVOTHROID) 125 MCG tablet Take 125 mcg by mouth daily before  breakfast.     [provider]  linaclotide (LINZESS) 290 MCG CAPS capsule Take 290 mcg by mouth daily. 06/07/19   [provider]  methocarbamol (ROBAXIN) 500 MG tablet Take 1 tablet (500 mg total) by mouth at bedtime as needed for muscle spasms. Patient not taking: Reported on 04/26/2022 01/17/18   Caccavale, Sophia, PA-C  metoprolol succinate (TOPROL-XL) 100 MG 24 hr tablet Take 1 tablet (100 mg total) by mouth daily. Take with or immediately following a meal. 04/27/22   Johny Blamer, DO  mycophenolate (MYFORTIC) 360 MG TBEC EC tablet Take 360 mg by mouth daily. 12/26/18   [provider]  naproxen (NAPROSYN) 500 MG tablet Take 1 tablet (500 mg total) by mouth 2 (two) times daily with a meal. Patient not taking: Reported on 04/26/2022 01/17/18   Caccavale, Sophia, PA-C  NIFEdipine (PROCARDIA-XL/ADALAT CC) 60 MG 24 hr tablet Take 1 tablet (60 mg total) by mouth daily. 03/31/16   Theodis Blaze, MD  nitroGLYCERIN (NITROSTAT) 0.4 MG SL tablet Place 0.4 mg under the tongue as needed. 01/28/22   [provider]  NOVOLOG FLEXPEN 100 UNIT/ML FlexPen Inject 5-15 Units into the skin with breakfast, with lunch, and with evening meal. 01/22/22   [provider]  oxyCODONE-acetaminophen (PERCOCET) 10-325 MG tablet Take 1 tablet by mouth every 6 (six) hours as needed for pain. 03/31/16   Theodis Blaze, MD  pravastatin (PRAVACHOL) 40 MG tablet Take 40 mg by mouth at bedtime.  Patient not taking: Reported on 04/26/2022    [provider]  predniSONE (DELTASONE) 5 MG tablet Take 5 mg by mouth daily. 05/26/21   [provider]  pregabalin (LYRICA) 75 MG capsule Take 75 mg by mouth 2 (two) times daily. Patient not taking: Reported on 04/26/2022    [provider]  Probiotic Product (ACIDOPHILUS HIGH-POTENCY) CAPS Take 175 mg by mouth daily. 04/24/22 05/24/22  [provider]  promethazine (PHENERGAN) 25 MG tablet Take 25 mg by mouth every 8 (eight) hours  as needed for nausea or vomiting.     [provider]  RYBELSUS 7 MG TABS Take 1 tablet by mouth daily. 03/05/22   [provider]  sevelamer carbonate (RENVELA) 800 MG tablet Take 800-1,600 mg by mouth 3 (three) times daily with meals. Take 2 tablets (1600 mg) by mouth with meals, and 1 tablet (800 mg) with snacks    [provider]  sulfamethoxazole-trimethoprim (BACTRIM) 400-80 MG tablet Take 1 tablet by mouth See admin instructions. 1 tablet by mouth Monday/wednesdays/fridays 05/26/21   [provider]  tacrolimus ER (ENVARSUS XR) 1 MG TB24 Take 1 mg by mouth daily. 05/26/21   [provider]  VENTOLIN HFA 108 (90 Base) MCG/ACT inhaler Inhale 1 puff into the lungs every 4 (four) hours as needed. 02/03/22   [provider]      Allergies  Lisinopril and Hydralazine    Review of Systems   Review of Systems  Constitutional:  Negative for fever.  Respiratory:  Positive for shortness of breath. Negative for cough.     Physical Exam Updated Vital Signs BP (!) 116/55 (BP Location: Right Arm)   Pulse 80   Temp 98 F (36.7 C)   Resp 19   SpO2 96%  Physical Exam CONSTITUTIONAL: Chronically ill-appearing, no acute distress HEAD: Normocephalic/atraumatic EYES: EOMI/PERRL ENMT: Mucous membranes moist NECK: supple no meningeal signs, JVD noted CV: S1/S2 noted,  LUNGS: Crackles bilaterally, mild tachypnea, 6 L nasal cannula with pulse ox above 90% ABDOMEN: soft, nontender NEURO: Pt is awake/alert/appropriate, moves all extremitiesx4.  No facial droop.   EXTREMITIES: pulses normal/equal, full ROM, minimal edema of the lower extremities SKIN: warm, color normal PSYCH: no abnormalities of mood noted, alert and oriented to situation  ED Results / Procedures / Treatments   Labs (all labs ordered are listed, but only abnormal results are displayed) Labs Reviewed  BASIC METABOLIC PANEL - Abnormal; Notable for the following components:       Result Value   Glucose, Bld 64 (*)    All other components within normal limits  CBC - Abnormal; Notable for the following components:   Hemoglobin 10.4 (*)    HCT 34.6 (*)    MCV 78.6 (*)    MCH 23.6 (*)    RDW 19.9 (*)    All other components within normal limits  BRAIN NATRIURETIC PEPTIDE  CBG MONITORING, ED  TROPONIN I (HIGH SENSITIVITY)    EKG EKG Interpretation  Date/Time:  Monday May 10 2022 15:16:38 EDT Ventricular Rate:  89 PR Interval:  134 QRS Duration: 88 QT Interval:  344 QTC Calculation: 418 R Axis:   103 Text Interpretation: Normal sinus rhythm Rightward axis Abnormal ECG When compared with ECG of 10-May-2022 05:40, NO SIGNIFICANT CHANGE SINCE LAST TRACING YESTERDAY Confirmed by Ripley Fraise [phone]) on 05/11/2022 12:48:26 AM  Radiology DG Chest 2 View  Result Date: 05/10/2022 CLINICAL DATA:  Shortness of breath EXAM: CHEST - 2 VIEW COMPARISON:  Chest x-ray dated May 09, 2022 FINDINGS: Unchanged cardiomegaly. Prior median sternotomy. Similar bilateral interstitial opacities. No evidence of pleural effusion or pneumothorax. IMPRESSION: Unchanged pulmonary edema. Electronically Signed   By: Yetta Glassman M.D.   On: 05/10/2022 16:31   CT Renal Stone Study  Result Date: 05/10/2022 CLINICAL DATA:  Flank pain with kidney stone suspected EXAM: CT ABDOMEN AND PELVIS WITHOUT CONTRAST TECHNIQUE: Multidetector CT imaging of the abdomen and pelvis was performed following the standard protocol without IV contrast. RADIATION DOSE REDUCTION: This exam was performed according to the departmental dose-optimization program which includes automated exposure control, adjustment of the mA and/or kV according to patient size and/or use of iterative reconstruction technique. COMPARISON:  07/17/2012 FINDINGS: Lower chest: Interlobular septal thickening at the bases with atelectasis and small left pleural effusion. Cardiomegaly and extensive coronary atherosclerosis. Postoperative  scarring adjacent to the sternum. Hepatobiliary: No focal liver abnormality.No evidence of biliary obstruction or stone. Pancreas: Generalized atrophy Spleen: Unremarkable. Adrenals/Urinary Tract: Negative adrenals. Advanced native renal atrophy with extensive arterial calcification. Transplant kidney in the right lower quadrant without hydronephrosis confusion, perinephric collection, or mass. Unremarkable bladder. Stomach/Bowel: No obstruction. No appendicitis. Stool distended proximal colon. Vascular/Lymphatic: Atheromatous calcification of the aorta and branch vessels, extensive. No mass or adenopathy. Reproductive:Hysterectomy Other: Negative for ascites or pneumoperitoneum.  Body wall edema Musculoskeletal: Bilateral femoral head sclerosis and subchondral cystic change attributed to underlying  renal osteodystrophy. No acute fracture. IMPRESSION: 1. No acute intra-abdominal finding. Unremarkable appearance of right lower quadrant transplant kidney. 2. Volume overload with pulmonary edema, anasarca and small left pleural effusion. 3. Atherosclerosis including the coronary arteries. Electronically Signed   By: Jorje Guild M.D.   On: 05/10/2022 07:23   DG Chest 2 View  Result Date: 05/09/2022 CLINICAL DATA:  Chest pain. EXAM: CHEST - 2 VIEW COMPARISON:  Radiograph and CT 04/27/2019 FINDINGS: Median sternotomy. Stable cardiomegaly post CABG. Increased pulmonary edema from prior exam. Fluid in the fissures without large subpulmonic effusion. Linear atelectasis at the left lung base. No pneumothorax. No acute osseous findings. IMPRESSION: Increased pulmonary edema from prior exam. Fluid in the fissures. Stable cardiomegaly post CABG. Electronically Signed   By: Keith Rake M.D.   On: 05/09/2022 21:29    Procedures .Critical Care  Performed by: Ripley Fraise, MD Authorized by: Ripley Fraise, MD   Critical care provider statement:    Critical care time (minutes):  33   Critical care start  time:  05/11/2022 1:11 AM   Critical care end time:  05/11/2022 1:44 AM   Critical care time was exclusive of:  Separately billable procedures and treating other patients   Critical care was necessary to treat or prevent imminent or life-threatening deterioration of the following conditions:  Respiratory failure and cardiac failure   Critical care was time spent personally by me on the following activities:  Development of treatment plan with patient or surrogate, examination of patient, re-evaluation of patient's condition, pulse oximetry, ordering and review of radiographic studies, ordering and review of laboratory studies, review of old charts and ordering and performing treatments and interventions   I assumed direction of critical care for this patient from another provider in my specialty: no     Care discussed with: admitting provider       Medications Ordered in ED Medications  furosemide (LASIX) injection 40 mg (has no administration in time range)  ondansetron (ZOFRAN-ODT) disintegrating tablet 4 mg (4 mg Oral Given 05/10/22 1608)    ED Course/ Medical Decision Making/ A&P Clinical Course as of 05/11/22 0143  Tue May 11, 2022  0047 Hemoglobin(!): 10.4 Mild anemia [DW]  0115 Per patient, she was around 87 kg yesterday.  She is now up to 90 kg [DW]  0115 Discussed with Dr. Rick Duff with internal medicine.  Patient will be admitted to their service.  Plan will be to perform diuresis and to optimize her medications. [DW]  0123 Patient with mild hypoglycemia, given juice to drink [DW]    Clinical Course User Index [DW] Ripley Fraise, MD                           Medical Decision Making Amount and/or Complexity of Data Reviewed Labs: ordered. Decision-making details documented in ED Course.  Risk Prescription drug management. Decision regarding hospitalization.   This patient presents to the ED for concern of shortness of breath, this involves an extensive number of  treatment options, and is a complaint that carries with it a high risk of complications and morbidity.  The differential diagnosis includes but is not limited to Acute coronary syndrome, pneumonia, acute pulmonary edema, pneumothorax, acute anemia, pulmonary embolism    Comorbidities that complicate the patient evaluation: Patient's presentation is complicated by their history of end-stage renal disease, CAD, pulmonary fibrosis, chronic respiratory failure  Social Determinants of Health: Patient's impaired access to primary care  increases  the complexity of managing their presentation  Additional history obtained: Records reviewed previous admission documents  Lab Tests: I Ordered, and personally interpreted labs.  The pertinent results include: Anemia noted  Imaging Studies ordered: I ordered imaging studies including X-ray chest   I independently visualized and interpreted imaging which showed pulmonary edema I agree with the radiologist interpretation  Cardiac Monitoring: The patient was maintained on a cardiac monitor.  I personally viewed and interpreted the cardiac monitor which showed an underlying rhythm of:  sinus rhythm  Medicines ordered and prescription drug management: I ordered medication including Lasix for edema Reevaluation of the patient after these medicines showed that the patient    stayed the same    Critical Interventions:  Lasix and admission to the hospital  Consultations Obtained: I requested consultation with the admitting physician internal medicine resident , and discussed  findings as well as pertinent plan - they recommend: admit  Reevaluation: After the interventions noted above, I reevaluated the patient and found that they have :stayed the same  Complexity of problems addressed: Patient's presentation is most consistent with  acute presentation with potential threat to life or bodily function  Disposition: After consideration of the  diagnostic results and the patient's response to treatment,  I feel that the patent would benefit from admission   .           Final Clinical Impression(s) / ED Diagnoses Final diagnoses:  Acute diastolic congestive heart failure ([place])  Acute respiratory failure with hypoxia [facility])    Rx / DC Orders ED Discharge Orders     None         Ripley Fraise, MD 05/11/22 0145

## 2022-05-11 NOTE — H&P (Addendum)
Date: 05/11/2022               Patient Name:  [name] MRN: [id number]  DOB: 05/12/1961 Age / Sex: 61 y.o., female   PCP: Volney Presser, [place]         Medical Service: Internal Medicine Teaching Service         Attending Physician: Dr. Lucious Groves, DO    First Contact: Dr. Vena Rua  Pager: [phone]  Second Contact: Dr. 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  Patient reports that the day prior to admission, she was not feeling well.  She states that she felt really fatigued, even after sleeping for a lot of the day.  She was having some burning with urination and thought that her symptoms could be due to a urinary tract infection.  Because she has had severe urinary tract infections in the past she presented to the ED for further evaluation.  Patient's urinalysis showed budding yeast, rare bacteria, small leukocytes.  She was discharged on Keflex and Diflucan.  A CT stone study was also obtained as patient mentions she had lower back pain.  Showed no evidence of renal calculus but did show volume overload.  Patient was also told to increase her Lasix to 40 mg daily for a week to follow-up with her PCP.  Patient states that when she left the ED she felt that her breathing was back to baseline.  She felt that she could perform her usual activities, which include walking around her house, with her prior oxygen of 3 L.   However, she says that she ate something she does not recall and drink a 12 ounce drink.  After that, she had difficulty taking deep breaths.  She says it was not painful to take a deep breath, she just felt like she could not catch her breath.  She noticed some wheezing and she also states that she walked a short distance from her living room to her kitchen and was more fatigued than usual.  She tried to sit down and take slow breaths and she used her albuterol nebulizer, but states that her symptoms persisted.  When her son returned home from retrieving her recently prescribed medications she states that she needs to go back to the hospital for her breathing.  Patient also notes during this time that she did place her pulse ox on her finger and it was reading in the 70s.  It did improve to the low 80s with deep breaths and with patient increasing her home O2 to 4 and half liters.  Patient denies any new headache, changes in vision, new chest pain (she has had persistent chest discomfort since her CABG), emesis, cough, fever, chills, or diarrhea.  She has chronic poor appetite since her hospitalization for COVID in 2020.  She also has nausea chronically which she takes as  needed Phenergan.   Medications:  Nifedipine  Isosorbide dinitrate  Aspiprin Lipitor  Carvedilol BID   Linzess Eliquis (RLE DVT) Flexeril at night help with pain  Oxycodone 7.5 TID PRN for hand had steal on the LUE Amitriptyline (hip pain) Benadryl PRN Iron tabs Gabapentin TID Basaglore 15u night SSI  Rybelsus in the morning Synthroid  Metoprol 100mg  qd Myfortic  Prednisone once a day Tacrolimus Bactrim MWF  Phnergan motion sickness in the morning.  Albuterol nebs and inhaler PRN, uses sparingly   SH:  No tobacco use quit smoking over 30 years ago  No alcohol use  No other drugs Lives byself in [place], [place]. Performs all of her ADLs and iADLs herself. Son and Librarian, academic in [place]. Son in [place].    Meds:   Nifedipine  Isosorbide dinitrate  Aspiprin Lipitor  Carvedilol BID   Linzess Eliquis (RLE DVT) Flexeril at night help with pain  Oxycodone 7.5 TID PRN for hand had steal on the LUE Amitriptyline (hip pain) Benadryl PRN Iron tabs Gabapentin TID Basaglore 15u night SSI  Rybelsus in the morning Synthroid  Metoprol 100mg  qd Myfortic  Prednisone once a day Tacrolimus Bactrim MWF  Phnergan motion sickness in the morning.  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 On multiple medications.   Gastroparesis due to DM [facility])    GERD (gastroesophageal reflux disease)    Heart murmur    Hemodialysis patient [facility])    Hyperlipidemia    Hypothyroidism (acquired)    Paroxysmal supraventricular tachycardia ([place])    Peripheral arterial disease [facility]) December 2014   @ [place]: a) RSFA PTA (01/2013); b) L SFA [place] Atherectomy/PTA with 3V runoff; LEA Dopplers January [facility] post L SFA PTA): Mild, insignificant disease in the left CFA, profunda, SFA, popliteal artery and tibioperoneal trunk.   Sickle cell trait ([place])     Family History:  Family History  Problem Relation Age of Onset   Diabetes Mother    Hypertension Mother    Heart attack Mother    Diabetes Father    Hypertension Father      Social History:   No tobacco use quit smoking over 30 years ago  No alcohol use  No other  drugs Lives byself in [place], [place]. Performs all of her ADLs and iADLs herself. Son and Librarian, academic in [place]. Son in [place].   Review of Systems: A complete ROS was negative except as per HPI.   Physical Exam: Blood pressure (!) 115/48, pulse 83, temperature 98 F (36.7 C), temperature source Oral, resp. rate (!) 21, height 5\' 2"  (1.575 m), weight 90.1 kg, SpO2 96 %.  Constitutional: Well-developed, well-nourished, and in no distress.  HENT:  Head: Normocephalic and atraumatic.  Neck: Normal range of motion.  Cardiovascular: Normal rate, regular rhythm, intact distal pulses. No gallop and no friction rub.  No murmur heard. 1-2+ bilateral lower extremity edema to the knees. JVD ~8cm  Pulmonary: Non labored breathing on 6L [place], mild wheezing heard predominantly in upper lobes, minimal bibasilar rales  Abdominal: Soft. Normal bowel sounds. Non distended and non tender Musculoskeletal: Normal range of motion.        General: No tenderness  Neurological: Alert and oriented to person, place, and time. Non focal  Skin: Skin is warm and dry.    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   #Acute  on chronic HFpEF #Acute on chronic hypoxic respiratory failure  Patient has a history of HFpEF. She was noted to be volume up at a recent pulmonology appointment but unfortunately was not able to increase her lasix as advised because her previous prescription was only for 20mg  of lasix daily. She again was noted to be hypervolemic after a recent ED visit. Although, she was given a new prescription this was not started as patient became symptomatic and presented back to the ED. Patient states she has had issues with volume since her CABG. In addition, she has chronic hypoxic respiratory failure due to post covid ILD. Her O2 sats improved on 6L [place], she was noted to have some wheezing on exam, mild rales, and BLE edema. She was given a 1x dose of IV lasix 40mg .   Also considered, infection in the setting of patient's chronic immunosuppression however she has had no cough. Suspicion for PE is also lower on the differential given she is anticoagulated with eliquis.  -Will redose lasix once patient evaluated on rounds  -Strict I/O's -Daily weights  -Wean O2 as able -Pulm toilet   #Possible UTI  Patient initially presented the day before admission with dysuria (ie., burning with urination). Her urinalysis showed rare bacteria, small leukocytes, and budding yeast. She was sent home with a prescription for a 1x dose of diflucan and keflex. Unfortunately a urine culture was not sent at that time. Patient does not have any symptoms currently. However, could consider treating her for cystitis secondary to yeast. Patient would need 14d of therapy for this.  -Recollect UA, Ucx -Consider treatment for fungal cystitis   Chronic medical problems:  #s/p DDRT Continue mycophenolate, tacrolimus, and prednisone. She is on MWF bactrim as well.  #HTN Resumed carvedilol, nifedipine, isordil dinitrate, does not take metoprolol  #CAD Per above, atorvasatin #H/o RLE DVT in the setting of acute illness On eliquis, may  be able to stop this given, DVT was provoked and she has been on this medication since 06/2021 #Normocytic anemia  Continue iron supplementation Check B12, folate  #Mild leukopenia  #Chronic malnutrition C/s dietitian  #T2DM Patient poor appetite, hypoglycemic on admission BMP. Hold home long acting and start SSI only, hold PO antidiabetic medications.  #Chronic pain Resume home analgesics  Dispo: Admit patient to Observation with expected length of stay less than 2 midnights.  Signed: Rick Duff, MD 05/11/2022, 5:20 AM  After 5pm on weekdays and 1pm on weekends: On Call pager: [phone]

## 2022-05-11 NOTE — ED Notes (Signed)
Breakfast order placed ?

## 2022-05-11 NOTE — ED Notes (Signed)
Pt desatting - pt had to be turned up to 6L by MD [facility]

## 2022-05-11 NOTE — Progress Notes (Signed)
HD#0 Subjective:   Summary: [name] is a 61 year old female with a past medical history of CAD s/p CABG on 03/16/2022, chronic respiratory failure secondary to COVID-pneumonia in 2020 on 3 L O2 at home, ESRD s/p transplant on immunosuppression, HFpEF, prior DVT on Eliquis, T2DM, and HLD who presented with worsening dyspnea and is admitted for acute heart failure exacerbation.  Overnight Events: No acute events  Today she is laying in bed and states that she is doing well.  She denies any worsening of her dyspnea and states it is gotten a lot better since she got her IV Lasix.  She denies any current or previous increased or different chest pain than the pain that was evaluated last admission.  She has not run out of her Eliquis and takes it as prescribed.  She denies any recent cough.  She did recently go see her cardiothoracic surgeon in [place] and they do recommend that she will not need further routine follow-up with them.  Objective:  Vital signs in last 24 hours: Vitals:   05/11/22 1000 05/11/22 1041 05/11/22 1045 05/11/22 1215  BP: (!) 111/43  (!) 119/47 (!) 117/47  Pulse: 84  85 83  Resp: 20  (!) 23 17  Temp:  98.5 F (36.9 C)  98.5 F (36.9 C)  TempSrc:  Oral  Oral  SpO2: 96%  92% 91%  Weight:      Height:       Supplemental O2: Nasal Cannula SpO2: 91 % O2 Flow Rate (L/min): 6 L/min   Physical Exam:  Constitutional: well-appearing female sitting in bed, in no acute distress HENT: normocephalic atraumatic Neck: supple, JVD to the lower earlobe Cardiovascular: regular rate and rhythm, 2/6 systolic murmur over the right sternal border Pulmonary/Chest: normal work of breathing on room air, mildly diminished lung sounds primarily in the bases Abdominal: soft, non-tender, non-distended MSK: normal bulk and tone, 2+ pitting edema in the lower extremities bilaterally below the knees Neurological: alert & oriented x 3 Skin: warm and dry  Filed Weights    05/11/22 0112  Weight: 90.1 kg     Intake/Output Summary (Last 24 hours) at 05/11/2022 1310 Last data filed at 05/11/2022 0723 Gross per 24 hour  Intake 46.36 ml  Output 700 ml  Net -653.64 ml   Net IO Since Admission: -653.64 mL [05/11/22 1310]  Pertinent Labs:    Latest Ref Rng & Units 05/11/2022    4:19 AM 05/10/2022    3:29 PM 05/09/2022    9:16 PM  CBC  WBC 4.0 - 10.5 K/uL 3.9  4.4  4.0   Hemoglobin 12.0 - 15.0 g/dL 10.7  10.4  11.2   Hematocrit 36.0 - 46.0 % 38.2  34.6  38.7   Platelets 150 - 400 K/uL 184  202  192        Latest Ref Rng & Units 05/11/2022    4:19 AM 05/10/2022    3:29 PM 05/09/2022    9:16 PM  CMP  Glucose 70 - 99 mg/dL 130  64  146   BUN 8 - 23 mg/dL 11  12  13    Creatinine 0.44 - 1.00 mg/dL 0.89  0.84  1.02   Sodium 135 - 145 mmol/L 132  136  136   Potassium 3.5 - 5.1 mmol/L 5.2  3.6  3.9   Chloride 98 - 111 mmol/L 103  103  103   CO2 22 - 32 mmol/L 21  23  21    Calcium  8.9 - 10.3 mg/dL 8.4  9.2  9.0     Imaging: DG Chest 2 View  Result Date: 05/10/2022 CLINICAL DATA:  Shortness of breath EXAM: CHEST - 2 VIEW COMPARISON:  Chest x-ray dated May 09, 2022 FINDINGS: Unchanged cardiomegaly. Prior median sternotomy. Similar bilateral interstitial opacities. No evidence of pleural effusion or pneumothorax. IMPRESSION: Unchanged pulmonary edema. Electronically Signed   By: Yetta Glassman M.D.   On: 05/10/2022 16:31    Assessment/Plan:   Principal Problem:   Acute exacerbation of CHF (congestive heart failure) ([place])   Patient Summary: [name] is a 61 year old female with a past medical history of CAD s/p CABG on 03/16/2022, chronic respiratory failure secondary to COVID-pneumonia in 2020 on 3 L O2 at home, ESRD s/p transplant on immunosuppression, HFpEF, prior DVT on Eliquis, T2DM, and HLD who presented with worsening dyspnea and is admitted for acute heart failure exacerbation.   Acute exacerbation of HFpEF NYHA stage D class III Acute on  chronic hypoxic respiratory failure 2/2 COVID-pneumonia in 2020 Patient was recently discharged after an acute exacerbation of her heart failure.  She had originally presented after taking Lasix 20 mg every other day which appeared to be inadequate as she developed worsening edema and dyspnea.  TTE at that time showed an EF of 60 to 65% and mild LVH but otherwise showed no significant abnormalities.  She was discharged with a prescription for Lasix 20 mg daily.  She then saw pulmonology who recommended she increase her dose to 40 mg twice daily 4 weeks.  They noted that she was prescribed 40 mg daily at that time.  There is no documentation available to [place] that indicates the change from 20 daily to 40 daily.  She was not able to pick up her prescription for the increased dosing and then presented to the ED yesterday initially for back pain and dysuria.  She states after being discharged from the ED she had acute onset shortness of breath which was mostly characterized by an inability to take deep breaths.  She tried rest and albuterol inhaler without relief.  She then returned to the hospital.  Evaluation so far shown BNP of 600, negative troponin X3, low iron, chest x-ray significant for pulmonary edema.  She responded well to IV Lasix 40 mg.  She is still on 6 L nasal cannula, up from home 3 L.  EKG was significant for changes which include signs of right heart strain. 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  She is on chronic Eliquis which she has taken consistently and has not run out of.  There are EKG findings as above which are concerning for possible PE.   With her DVT history we will get a CTA PE protocol for further evaluation. - Continue Eliquis 5 mg twice daily  Recurrent UTI She has had recurrent UTIs after renal transplant.  She presented to the emergency department several hours prior to returning for this admission with complaints of back pain and dysuria. Urinalysis showed rare bacteria, few leukocytes, and budding yeast.  She was given a dose of Diflucan and Keflex before being discharged from  the emergency department.  Urine culture was not sent at that time.  He then returned with increasing dyspnea and continued dysuria.  We repeated the UA and got a urine culture.  UA here is clean. - We will continue to monitor for symptoms and urine culture  CAD s/p CABG Hypertension Hyperlipidemia CABG x 3 (LIMA to LAD, SVG to OM, SVG to PDA) on 03/16/2022.  Troponins are negative.  No new or worsening chest pain. - Continue home nifedipine 60 mg daily, Coreg 12.5 mg twice daily, atorvastatin 10 mg daily, aspirin 81 mg daily  ESRD s/p renal transplant ESRD due to diabetic neuropathy.  Creatinine 1.02 on admission down to 0.68. - Continue tacrolimus, mycophenolate, Bactrim MWF, and prednisone.  T2DM with neuropathy A1c of 6.7% on 03/10/2022.  Home regimen of glargine 15 units daily and sliding scale.   -Hold long-acting in the setting of poor p.o. intake -SSI -Gabapentin 300 mg 3 times daily, Percocet 7.5, and Flexeril 10 mg as needed  Normocytic anemia Mild leukopenia Hemoglobin of 10.7 and leukocytes at 3.9.  Likely secondary to malnutrition, chronic diseases, and immunosuppressive therapy.  Iron of 26, saturation of 11, ferritin of 534. - IV iron and continue oral iron supplementation  Hypothyroidism Continue home levothyroxine 125 mcg daily.  Diet: Heart Healthy IVF: None,None VTE: DOAC Code: Full PT/OT recs: Home Health, none.  Dispo: Anticipated discharge to Home in 1-2 days pending further evaluation and management of her acute  dyspnea.   [facility] Internal Medicine Resident PGY-1 Pager: [phone]  Please contact the on call pager after 5 pm and on weekends at [phone].

## 2022-05-12 DIAGNOSIS — E1121 Type 2 diabetes mellitus with diabetic nephropathy: Secondary | ICD-10-CM | POA: Diagnosis not present

## 2022-05-12 DIAGNOSIS — I5033 Acute on chronic diastolic (congestive) heart failure: Secondary | ICD-10-CM | POA: Diagnosis present

## 2022-05-12 DIAGNOSIS — U099 Post covid-19 condition, unspecified: Secondary | ICD-10-CM

## 2022-05-12 DIAGNOSIS — E871 Hypo-osmolality and hyponatremia: Secondary | ICD-10-CM | POA: Diagnosis present

## 2022-05-12 DIAGNOSIS — D573 Sickle-cell trait: Secondary | ICD-10-CM | POA: Diagnosis present

## 2022-05-12 DIAGNOSIS — I82511 Chronic embolism and thrombosis of right femoral vein: Secondary | ICD-10-CM

## 2022-05-12 DIAGNOSIS — E1143 Type 2 diabetes mellitus with diabetic autonomic (poly)neuropathy: Secondary | ICD-10-CM | POA: Diagnosis present

## 2022-05-12 DIAGNOSIS — E1122 Type 2 diabetes mellitus with diabetic chronic kidney disease: Secondary | ICD-10-CM | POA: Diagnosis present

## 2022-05-12 DIAGNOSIS — E119 Type 2 diabetes mellitus without complications: Secondary | ICD-10-CM | POA: Diagnosis not present

## 2022-05-12 DIAGNOSIS — E861 Hypovolemia: Secondary | ICD-10-CM | POA: Diagnosis present

## 2022-05-12 DIAGNOSIS — D509 Iron deficiency anemia, unspecified: Secondary | ICD-10-CM | POA: Diagnosis present

## 2022-05-12 DIAGNOSIS — Z8616 Personal history of COVID-19: Secondary | ICD-10-CM | POA: Diagnosis not present

## 2022-05-12 DIAGNOSIS — N3001 Acute cystitis with hematuria: Secondary | ICD-10-CM | POA: Diagnosis not present

## 2022-05-12 DIAGNOSIS — N179 Acute kidney failure, unspecified: Secondary | ICD-10-CM | POA: Diagnosis present

## 2022-05-12 DIAGNOSIS — I2510 Atherosclerotic heart disease of native coronary artery without angina pectoris: Secondary | ICD-10-CM | POA: Diagnosis present

## 2022-05-12 DIAGNOSIS — D84821 Immunodeficiency due to drugs: Secondary | ICD-10-CM | POA: Diagnosis present

## 2022-05-12 DIAGNOSIS — Z794 Long term (current) use of insulin: Secondary | ICD-10-CM | POA: Diagnosis not present

## 2022-05-12 DIAGNOSIS — T8619 Other complication of kidney transplant: Secondary | ICD-10-CM | POA: Diagnosis present

## 2022-05-12 DIAGNOSIS — J9611 Chronic respiratory failure with hypoxia: Secondary | ICD-10-CM | POA: Diagnosis present

## 2022-05-12 DIAGNOSIS — Z940 Kidney transplant status: Secondary | ICD-10-CM

## 2022-05-12 DIAGNOSIS — B961 Klebsiella pneumoniae [K. pneumoniae] as the cause of diseases classified elsewhere: Secondary | ICD-10-CM | POA: Diagnosis present

## 2022-05-12 DIAGNOSIS — E1142 Type 2 diabetes mellitus with diabetic polyneuropathy: Secondary | ICD-10-CM | POA: Diagnosis present

## 2022-05-12 DIAGNOSIS — N186 End stage renal disease: Secondary | ICD-10-CM | POA: Diagnosis present

## 2022-05-12 DIAGNOSIS — I509 Heart failure, unspecified: Secondary | ICD-10-CM | POA: Diagnosis present

## 2022-05-12 DIAGNOSIS — I82561 Chronic embolism and thrombosis of right calf muscular vein: Secondary | ICD-10-CM

## 2022-05-12 DIAGNOSIS — E039 Hypothyroidism, unspecified: Secondary | ICD-10-CM | POA: Diagnosis present

## 2022-05-12 DIAGNOSIS — Y830 Surgical operation with transplant of whole organ as the cause of abnormal reaction of the patient, or of later complication, without mention of misadventure at the time of the procedure: Secondary | ICD-10-CM | POA: Diagnosis present

## 2022-05-12 DIAGNOSIS — E1151 Type 2 diabetes mellitus with diabetic peripheral angiopathy without gangrene: Secondary | ICD-10-CM | POA: Diagnosis present

## 2022-05-12 DIAGNOSIS — E785 Hyperlipidemia, unspecified: Secondary | ICD-10-CM

## 2022-05-12 DIAGNOSIS — N390 Urinary tract infection, site not specified: Secondary | ICD-10-CM | POA: Diagnosis not present

## 2022-05-12 DIAGNOSIS — J9601 Acute respiratory failure with hypoxia: Secondary | ICD-10-CM | POA: Diagnosis not present

## 2022-05-12 DIAGNOSIS — I110 Hypertensive heart disease with heart failure: Secondary | ICD-10-CM

## 2022-05-12 DIAGNOSIS — J9621 Acute and chronic respiratory failure with hypoxia: Secondary | ICD-10-CM | POA: Diagnosis not present

## 2022-05-12 DIAGNOSIS — E11649 Type 2 diabetes mellitus with hypoglycemia without coma: Secondary | ICD-10-CM | POA: Diagnosis present

## 2022-05-12 DIAGNOSIS — Z7901 Long term (current) use of anticoagulants: Secondary | ICD-10-CM | POA: Diagnosis not present

## 2022-05-12 DIAGNOSIS — I132 Hypertensive heart and chronic kidney disease with heart failure and with stage 5 chronic kidney disease, or end stage renal disease: Secondary | ICD-10-CM | POA: Diagnosis present

## 2022-05-12 DIAGNOSIS — Z6835 Body mass index (BMI) 35.0-35.9, adult: Secondary | ICD-10-CM

## 2022-05-12 LAB — COMPREHENSIVE METABOLIC PANEL
ALT: 14 U/L (ref 0–44)
AST: 25 U/L (ref 15–41)
Albumin: 3.1 g/dL — ABNORMAL LOW (ref 3.5–5.0)
Alkaline Phosphatase: 89 U/L (ref 38–126)
Anion gap: 10 (ref 5–15)
BUN: 10 mg/dL (ref 8–23)
CO2: 25 mmol/L (ref 22–32)
Calcium: 8.7 mg/dL — ABNORMAL LOW (ref 8.9–10.3)
Chloride: 99 mmol/L (ref 98–111)
Creatinine, Ser: 0.95 mg/dL (ref 0.44–1.00)
GFR, Estimated: >60 mL/min (ref >60)
Glucose, Bld: 149 mg/dL — ABNORMAL HIGH (ref 70–99)
Potassium: 4.2 mmol/L (ref 3.5–5.1)
Sodium: 134 mmol/L — ABNORMAL LOW (ref 135–145)
Total Bilirubin: 0.8 mg/dL (ref 0.3–1.2)
Total Protein: 6.4 g/dL — ABNORMAL LOW (ref 6.5–8.1)

## 2022-05-12 LAB — GLUCOSE, CAPILLARY
Glucose-Capillary: 207 mg/dL — ABNORMAL HIGH (ref 70–99)
Glucose-Capillary: 157 mg/dL — ABNORMAL HIGH (ref 70–99)
Glucose-Capillary: 278 mg/dL — ABNORMAL HIGH (ref 70–99)
Glucose-Capillary: 259 mg/dL — ABNORMAL HIGH (ref 70–99)
Glucose-Capillary: 299 mg/dL — ABNORMAL HIGH (ref 70–99)

## 2022-05-12 LAB — URINE CULTURE
Culture: 50000 — AB
Culture: <10000 — AB

## 2022-05-12 LAB — CBC
HCT: 34.8 % — ABNORMAL LOW (ref 36.0–46.0)
Hemoglobin: 10.7 g/dL — ABNORMAL LOW (ref 12.0–15.0)
MCH: 23.7 pg — ABNORMAL LOW (ref 26.0–34.0)
MCHC: 30.7 g/dL (ref 30.0–36.0)
MCV: 77 fL — ABNORMAL LOW (ref 80.0–100.0)
Platelets: 174 10*3/uL (ref 150–400)
RBC: 4.52 MIL/uL (ref 3.87–5.11)
RDW: 19.6 % — ABNORMAL HIGH (ref 11.5–15.5)
WBC: 3.2 10*3/uL — ABNORMAL LOW (ref 4.0–10.5)
nRBC: 0 % (ref 0.0–0.2)

## 2022-05-12 LAB — MAGNESIUM: Magnesium: 1.7 mg/dL (ref 1.7–2.4)

## 2022-05-12 MED ORDER — ENSURE ENLIVE PO LIQD
237.0000 mL | Freq: Three times a day (TID) | ORAL | Status: DC
  Administered 2022-05-14 – 2022-05-17 (×5): 237 mL via ORAL
  Filled 2022-05-12 (×2): qty 237

## 2022-05-12 MED ORDER — CEFDINIR 300 MG PO CAPS
300.0000 mg | ORAL_CAPSULE | Freq: Two times a day (BID) | ORAL | Status: DC
Start: 2022-05-12 — End: 2022-05-14
  Administered 2022-05-12 – 2022-05-13 (×4): 300 mg via ORAL
  Filled 2022-05-12 (×4): qty 1

## 2022-05-12 MED ORDER — SODIUM CHLORIDE 0.9 % IV SOLN
250.0000 mg | Freq: Once | INTRAVENOUS | Status: AC
  Administered 2022-05-12: 250 mg via INTRAVENOUS
  Filled 2022-05-12: qty 20

## 2022-05-12 MED ORDER — INSULIN GLARGINE-YFGN 100 UNIT/ML ~~LOC~~ SOLN
15.0000 [IU] | Freq: Every day | SUBCUTANEOUS | Status: DC
Start: 2022-05-12 — End: 2022-05-16
  Administered 2022-05-12 – 2022-05-15 (×4): 15 [IU] via SUBCUTANEOUS
  Filled 2022-05-12 (×5): qty 0.15

## 2022-05-12 MED ORDER — FUROSEMIDE 10 MG/ML IJ SOLN
80.0000 mg | Freq: Two times a day (BID) | INTRAMUSCULAR | Status: DC
  Administered 2022-05-12: 80 mg via INTRAVENOUS
  Filled 2022-05-12 (×2): qty 8

## 2022-05-12 MED ORDER — ADULT MULTIVITAMIN W/MINERALS CH
1.0000 | ORAL_TABLET | Freq: Every day | ORAL | Status: DC
  Administered 2022-05-12 – 2022-05-17 (×6): 1 via ORAL
  Filled 2022-05-12 (×6): qty 1

## 2022-05-12 MED ORDER — SULFAMETHOXAZOLE-TRIMETHOPRIM 400-80 MG PO TABS
1.0000 | ORAL_TABLET | ORAL | Status: DC
  Administered 2022-05-12 – 2022-05-17 (×3): 1 via ORAL
  Filled 2022-05-12 (×4): qty 1

## 2022-05-12 MED ORDER — INSULIN ASPART 100 UNIT/ML IJ SOLN
0.0000 [IU] | Freq: Three times a day (TID) | INTRAMUSCULAR | Status: DC
  Administered 2022-05-13: 4 [IU] via SUBCUTANEOUS
  Administered 2022-05-13 – 2022-05-14 (×2): 2 [IU] via SUBCUTANEOUS

## 2022-05-12 MED ORDER — AMITRIPTYLINE HCL 10 MG PO TABS
10.0000 mg | ORAL_TABLET | Freq: Every day | ORAL | Status: DC
  Administered 2022-05-13 – 2022-05-16 (×4): 10 mg via ORAL
  Filled 2022-05-12 (×5): qty 1

## 2022-05-12 MED ORDER — LEVOTHYROXINE SODIUM 100 MCG PO TABS
100.0000 ug | ORAL_TABLET | Freq: Every day | ORAL | Status: DC
  Administered 2022-05-13: 100 ug via ORAL
  Filled 2022-05-12: qty 1

## 2022-05-12 NOTE — TOC Initial Note (Signed)
Transition of Care [facility]) - Initial/Assessment Note    Patient Details  Name: [name] MRN: [id number] Date of Birth: Mar 11, 1961  Transition of Care [facility]) CM/SW Contact:    Zenon Mayo, RN Phone Number: 05/12/2022, 4:32 PM  Clinical Narrative:                 Patient is from home with her son she will be going back to her son's home at dc at [facility], she has home oxygen with [employer] 3 liters, walker, shower chair, bsc. She states she had some wheezing so she still had fluid so she came back to hospital and also felt like she had a uti.  NCM offered choice, she would like to stay with [facility] for HHPT.  Son and DIL will transport her home at dc.  TOC following.   Expected Discharge Plan: [place] Barriers to Discharge: Continued Medical Work up   Patient Goals and CMS Choice Patient states their goals for this hospitalization and ongoing recovery are:: return home CMS Medicare.gov Compare Post Acute Care list provided to:: Patient Choice offered to / list presented to : Patient  Expected Discharge Plan and Services Expected Discharge Plan: [place] In-house Referral: NA Discharge Planning Services: CM Consult Post Acute Care Choice: [place] arrangements for the past 2 months: Single Family Home                   DME Agency: NA       HH Arranged: PT HH Agency: [place] Date [facility] Agency Contacted: 05/12/22 Time HH Agency Contacted: 1631 Representative spoke with at [place]: Tommi Rumps  Prior Living Arrangements/Services Living arrangements for the past 2 months: Single Family Home Lives with:: Adult Children Patient language and need for interpreter reviewed:: Yes Do you feel safe going back to the place where you live?: Yes      Need for Family Participation in Patient Care: Yes (Comment) Care giver support system in place?: Yes (comment) Current home services: DME  (home oxygen with [employer] 3 liters, walker, shower chair, bsc) Criminal Activity/Legal Involvement Pertinent to Current Situation/Hospitalization: No - Comment as needed  Activities of Daily Living      Permission Sought/Granted                  Emotional Assessment Appearance:: Appears stated age Attitude/Demeanor/Rapport: Engaged Affect (typically observed): Appropriate Orientation: : Oriented to Self, Oriented to Place, Oriented to  Time, Oriented to Situation Alcohol / Substance Use: Not Applicable Psych Involvement: No (comment)  Admission diagnosis:  Acute exacerbation of CHF (congestive heart failure) (HCC) [L38.1] Acute diastolic congestive heart failure (HCC) [I50.31] Acute respiratory failure with hypoxia ([place]) [J96.01] Acute on chronic diastolic (congestive) heart failure ([place]) [I50.33] Patient Active Problem List   Diagnosis Date Noted   Acute on chronic diastolic (congestive) heart failure ([place]) 05/12/2022   Kidney transplant status, cadaveric 05/12/2022   Hyponatremia 05/12/2022   Iron deficiency anemia 05/12/2022   Acute cystitis with hematuria    Pressure injury of skin 04/27/2022   Fluid overload 03/28/2016   HCAP (healthcare-associated pneumonia) 11/09/2015   Sinus tachycardia    Acute on chronic respiratory failure with hypoxemia ([place]) 06/11/2015   Pleuritic chest pain 06/11/2015   Diabetes mellitus, type II, insulin dependent ([place])    Hypothyroidism (acquired)    Hyperlipidemia    Anemia due to pre-end-stage renal disease treated with erythropoietin  Diabetic peripheral neuropathy associated with type 2 diabetes mellitus ([place])    Hyperkalemia 10/07/2013   Peripheral arterial disease ([place]) 08/20/2013   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/08/2013   Sepsis ([place]) 06/05/2013   Pain in limb-Left upper arm 04/20/2013   Mechanical complication of other vascular device, implant, and graft 04/06/2013   End stage renal disease on dialysis due to  type 2 diabetes mellitus ([place]) 04/06/2013   Cardiac arrest ([place]) 07/11/2012   Morbid obesity ([place]) 07/11/2012   Acute respiratory failure with hypoxia ([place]) 07/11/2012   Essential hypertension: Associated with renal disease    Thyroid disease    Paroxysmal supraventricular tachycardia ([place]) 03/25/2010   PCP:  Volney Presser, FNP Pharmacy:   [facility] [place] [address] Phone: [phone] Fax: [phone]  [id number] - [place], [place] [address] [place] [address] Phone: [phone] Fax: [phone]     Social Determinants of Health ([place]) Interventions    Readmission Risk Interventions    05/12/2022    4:29 PM  Readmission Risk Prevention Plan  Transportation Screening Complete  PCP or Specialist Appt within 3-5 Days Complete  HRI or [place] Complete  Social Work Consult for [place] Planning/Counseling Complete  Palliative Care Screening Not Applicable  Medication Review Press photographer) Complete

## 2022-05-12 NOTE — Progress Notes (Signed)
   05/12/22 1000  Mobility  Activity Ambulated with assistance in hallway  Level of [place] wheel walker  Distance Ambulated (ft) 230 ft  Activity Response Tolerated well  $Mobility charge 1 Mobility   Mobility Specialist Progress Note  Received pt in bed having no complaints and agreeable to mobility. Pt was asymptomatic throughout ambulation and returned to room w/o fault. Left in chair w/ call bell in reach and all needs met.   Lucious Groves Mobility Specialist

## 2022-05-12 NOTE — Progress Notes (Signed)
Initial Nutrition Assessment  DOCUMENTATION CODES:   Obesity unspecified  INTERVENTION:  Encourage adequate PO intake Glucerna Shake po TID, each supplement provides 220 kcal and 10 grams of protein (pt prefers strawberry) MVI with minerals daily  NUTRITION DIAGNOSIS:   Increased nutrient needs related to acute illness as evidenced by estimated needs.  GOAL:   Patient will meet greater than or equal to 90% of their needs  MONITOR:   PO intake, Supplement acceptance, Labs, Weight trends, I & O's  REASON FOR ASSESSMENT:   Consult Assessment of nutrition requirement/status  ASSESSMENT:   Pt admitted with dyspnea r/t acute CHF exacerbation. 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  Pt sitting in chair at time of visit. She states that she started taking Rybelsus about a year ago. Since then she has had a slight change in her appetite. She used to snack between meals and at night but no longer does either. Currently she recalls eating 2-3 times per day but usually skips lunch. When she skips a meal, she will drink Glucerna, sometimes drinking 2-3 per day for increased protein intake. Breakfast is her favorite meal and usually has eggs, toast, grits and OJ. Lunch may include a salad with grilled chicken and dinner is usually baked or broiled chicken/fish. She generally eats vegetables over meat. She has been living alone and prepares her own meals but recently has been staying with her son who follows a vegan diet which she enjoys the meals they prepare at times including plant based meats. When preparing meals she avoid added salt as much as possible.   Meal completions: 8/22: 100%-dinner 8/23: 80%-breakfast  Between starting Rybelsus and her triple  bypass in June, she reports a gradual weight loss of 25 lbs.  Reviewed weight history via Care Everywhere- 07/27/21: 92.4 kg 09/24/21: 90.1 kg 02/12/22: 88.9 kg 05/12/22: 88.5 kg Her weight has been down trending slowly but it does not appear that she has had significant weight loss within the last year. Will continue to monitor throughout admission.   Edema: mild pitting BLE  Pt reports being anemic and takes iron supplements at home. Denies constipation however she takes a laxative to help with this. Also reports taking Vitamin D at home.    Medications: ferrous sulfate, folvite, lasix, SSI 0-5 units qhs, synthroid, linzess, myfortic, prednisone, senna, tacrolimus  Labs: sodium 134, CBG's 127-255 x24 hours  UOP: 633ml x12 hours I/O's: -1075ml since admission  NUTRITION - FOCUSED PHYSICAL EXAM:  Flowsheet Row Most Recent Value  Orbital Region No depletion  Upper Arm Region No depletion  Thoracic and Lumbar Region No depletion  Buccal Region No depletion  Temple Region No depletion  Clavicle Bone Region No depletion  Clavicle and Acromion Bone Region No depletion  Scapular Bone Region No depletion  Dorsal Hand No depletion  Patellar Region No depletion  Anterior Thigh Region No depletion  Posterior Calf Region No depletion  Edema (RD Assessment) None  Hair Reviewed  Eyes Reviewed  Mouth Reviewed  Skin Reviewed  Nails Reviewed       Diet Order:   Diet Order             Diet Heart Room service appropriate? Yes; Fluid consistency: Thin; Fluid restriction: 1800 mL Fluid  Diet effective now  EDUCATION NEEDS:   Education needs have been addressed  Skin:  Skin Assessment: Reviewed RN Assessment  Last BM:  8/21  Height:   Ht Readings from Last 1 Encounters:  05/11/22 5\' 2"  (1.575 m)    Weight:   Wt Readings from Last 1 Encounters:  05/12/22 88.5 kg    Ideal Body Weight:  50 kg  BMI:  Body mass index is 35.69 kg/m.  Estimated  Nutritional Needs:   Kcal:  1500-1700  Protein:  65-80g  Fluid:  1.8L   Clayborne Dana, RDN, LDN Clinical Nutrition

## 2022-05-12 NOTE — Progress Notes (Signed)
HD#0 Subjective:   Summary: [name] is a 61 year old female with a past medical history of CAD s/p CABG on 03/16/2022, chronic respiratory failure secondary to COVID-pneumonia in 2020 on 3 L O2 at home, ESRD s/p transplant on immunosuppression, HFpEF, prior DVT on Eliquis, T2DM, and HLD who presented with worsening dyspnea and is admitted for acute heart failure exacerbation.  Overnight Events: No acute events  Patient is doing well this morning after walking with PT and she does not think she needs the 6 L of oxygen.  They are able to turn her down to 3 L while we talked without any significant drops in her O2 while we talked.  We also discussed the fact that she is on carvedilol and metoprolol which she states she has been on carvedilol for a long time.  She was prescribed metoprolol after her CABG.  She has not been told not to take 1 or the other.  She also dates that she had a fungal UTI about a year ago and had to be treated at [facility] for this.  She is not having any more dysuria today.  She states her dry weight is about 195 lbs.  Objective:  Vital signs in last 24 hours: Vitals:   05/11/22 1900 05/12/22 0039 05/12/22 0631 05/12/22 0815  BP: (!) 143/57 (!) 128/49 (!) 132/47 (!) 127/45  Pulse: 74 80 81 85  Resp: 18 18 18 20   Temp: 98 F (36.7 C) 98 F (36.7 C) 98 F (36.7 C) 98.2 F (36.8 C)  TempSrc: Oral Oral Oral Oral  SpO2: 92% 90% (!) 83% (!) 89%  Weight:   88.5 kg   Height:       Supplemental O2: Nasal Cannula  SpO2: (!) 89 % O2 Flow Rate (L/min): 6 L/min   Physical Exam:  Constitutional: well-appearing female sitting in bed, in no acute distress HENT: normocephalic atraumatic Neck: supple Cardiovascular: regular rate and rhythm Pulmonary/Chest: normal work of breathing on room air, mildly diminished lung sounds primarily in the bases Abdominal: soft, non-tender, non-distended MSK: normal bulk and tone, 2+ pitting edema in the lower extremities  bilaterally below the knees Neurological: alert & oriented x 3 Skin: warm and dry  Filed Weights   05/11/22 0112 05/12/22 0631  Weight: 90.1 kg 88.5 kg     Intake/Output Summary (Last 24 hours) at 05/12/2022 1145 Last data filed at 05/12/2022 0840 Gross per 24 hour  Intake 240 ml  Output 650 ml  Net -410 ml   Net IO Since Admission: -1,063.64 mL [05/12/22 1145]  Pertinent Labs:    Latest Ref Rng & Units 05/12/2022    6:36 AM 05/11/2022    4:19 AM 05/10/2022    3:29 PM  CBC  WBC 4.0 - 10.5 K/uL 3.2  3.9  4.4   Hemoglobin 12.0 - 15.0 g/dL 10.7  10.7  10.4   Hematocrit 36.0 - 46.0 % 34.8  38.2  34.6   Platelets 150 - 400 K/uL 174  184  202        Latest Ref Rng & Units 05/12/2022    6:36 AM 05/11/2022    2:15 PM 05/11/2022    4:19 AM  CMP  Glucose 70 - 99 mg/dL 149  157  130   BUN 8 - 23 mg/dL 10  9  11    Creatinine 0.44 - 1.00 mg/dL 0.95  0.68  0.89   Sodium 135 - 145 mmol/L 134  134  132  Potassium 3.5 - 5.1 mmol/L 4.2  3.6  5.2   Chloride 98 - 111 mmol/L 99  104  103   CO2 22 - 32 mmol/L 25  21  21    Calcium 8.9 - 10.3 mg/dL 8.7  7.1  8.4   Total Protein 6.5 - 8.1 g/dL 6.4     Total Bilirubin 0.3 - 1.2 mg/dL 0.8     Alkaline Phos 38 - 126 U/L 89     AST 15 - 41 U/L 25     ALT 0 - 44 U/L 14       Imaging: CT Angio Chest Pulmonary Embolism (PE) W or WO Contrast  Result Date: 05/11/2022 CLINICAL DATA:  Concern for pulmonary embolism.  Short of breath EXAM: CT ANGIOGRAPHY CHEST WITH CONTRAST TECHNIQUE: Multidetector CT imaging of the chest was performed using the standard protocol during bolus administration of intravenous contrast. Multiplanar CT image reconstructions and MIPs were obtained to evaluate the vascular anatomy. RADIATION DOSE REDUCTION: This exam was performed according to the departmental dose-optimization program which includes automated exposure control, adjustment of the mA and/or kV according to patient size and/or use of iterative reconstruction  technique. CONTRAST:  36mL OMNIPAQUE IOHEXOL 350 MG/ML SOLN COMPARISON:  CT 04/26/2022 FINDINGS: Cardiovascular: No filling defects within the pulmonary arteries to suggest acute pulmonary embolism. Post CABG. Small pericardial effusion. Mediastinum/Nodes: No axillary or supraclavicular adenopathy. No mediastinal or hilar adenopathy. No pericardial fluid. Esophagus normal. Lungs/Pleura: Bilateral small effusions. Interstitial edema and pulmonary edema ground-glass densities at the lung bases similar to comparison CT. Upper Abdomen: Limited view of the liver, kidneys, pancreas are unremarkable. Normal adrenal glands. Musculoskeletal: No aggressive osseous lesion. Review of the MIP images confirms the above findings. IMPRESSION: 1. No evidence of acute pulmonary embolism. 2. Interstitial edema, pulmonary edema, and pleural fluid pattern similar to CT 04/26/2022. Findings suggest congestive heart failure. 3. Post CABG.  Small pericardial effusion. Electronically Signed   By: Suzy Bouchard M.D.   On: 05/11/2022 20:49    Assessment/Plan:   Principal Problem:   Acute exacerbation of CHF (congestive heart failure) (HCC) Active Problems:   Acute diastolic congestive heart failure ([place])   Acute cystitis with hematuria   Patient Summary: [name] is a 61 year old female with a past medical history of CAD s/p CABG on 03/16/2022, chronic respiratory failure secondary to COVID-pneumonia in 2020 on 3 L O2 at home, ESRD s/p transplant on immunosuppression, HFpEF, prior DVT on Eliquis, T2DM, and HLD who presented with worsening dyspnea and is admitted for acute heart failure exacerbation.     Acute exacerbation of HFpEF NYHA stage D class III Acute on chronic hypoxic respiratory failure 2/2 COVID-pneumonia in 2020 Responded to IV Lasix 40 mg but still overall volume up today.  She is still on 6 L nasal cannula we are able to titrate down to 3 to 4 L, up from home 3 L.  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  She is on chronic Eliquis which she has taken consistently and has not run out of.  There are EKG findings as above which are concerning for possible  PE.  CT PE protocol showed no evidence of pulmonary embolism. - Continue Eliquis 5 mg twice daily   Recurrent UTI She has had recurrent UTIs after renal transplant.  She presented to the emergency department several hours prior to returning for this admission with complaints of back pain and dysuria. Urinalysis showed rare bacteria, few leukocytes, and budding yeast.  She was given a dose of Diflucan and Keflex before being discharged from the emergency department.  Urine culture grew Klebsiella pneumoniae. She then returned with increasing dyspnea and continued dysuria.  We repeated the UA and got a urine culture.  UA here is clean and culture is pending. Chart review showed a culture on 04/19/2022 grew Klebsiella pneumonia with resistance to Bactrim, nitrofurantoin, and ampicillin but sensitivity to cephalosporins.  We started her on cefdinir 300 mg twice a day for 7-day course.  She will continue her Bactrim as below with her immunosuppressive therapy.   CAD s/p CABG Hypertension Hyperlipidemia CABG x 3 (LIMA to LAD, SVG to OM, SVG to PDA) on 03/16/2022.  Troponins are negative.  No new or worsening chest pain. - Continue home nifedipine 60 mg daily, Coreg 12.5 mg twice daily, atorvastatin 10 mg daily, aspirin 81 mg daily   ESRD s/p renal transplant ESRD due to diabetic neuropathy.  Creatinine 1.02 on admission down to 0.95. - Continue tacrolimus, mycophenolate, Bactrim MWF, and prednisone.   T2DM  with neuropathy A1c of 6.7% on 03/10/2022.  Home regimen of glargine 15 units daily and sliding scale.   -Hold long-acting in the setting of poor p.o. intake -SSI -Gabapentin 300 mg 3 times daily, Percocet 7.5, and Flexeril 10 mg as needed   Normocytic anemia Mild leukopenia Hemoglobin of 10.7 and leukocytes at 3.9.  Likely secondary to malnutrition, chronic diseases, and immunosuppressive therapy.  Iron of 26, saturation of 11, ferritin of 534. - IV iron complete and continue oral iron supplementation   Hypothyroidism Continue home levothyroxine 125 mcg daily.   Diet: Heart Healthy IVF: None,None VTE: DOAC Code: Full PT/OT recs: Home Health, none.   Dispo: Anticipated discharge to Home in 1-2 days pending further evaluation and management of her acute dyspnea.   [place] [facility] Resident PGY-1 Pager: [phone]  Please contact the on call pager after 5 pm and on weekends at [phone].

## 2022-05-13 DIAGNOSIS — Z794 Long term (current) use of insulin: Secondary | ICD-10-CM

## 2022-05-13 DIAGNOSIS — Z940 Kidney transplant status: Secondary | ICD-10-CM

## 2022-05-13 DIAGNOSIS — E119 Type 2 diabetes mellitus without complications: Secondary | ICD-10-CM

## 2022-05-13 DIAGNOSIS — I5033 Acute on chronic diastolic (congestive) heart failure: Secondary | ICD-10-CM | POA: Diagnosis not present

## 2022-05-13 DIAGNOSIS — N3001 Acute cystitis with hematuria: Secondary | ICD-10-CM | POA: Diagnosis not present

## 2022-05-13 LAB — BASIC METABOLIC PANEL
Anion gap: 11 (ref 5–15)
BUN: 12 mg/dL (ref 8–23)
CO2: 26 mmol/L (ref 22–32)
Calcium: 9.1 mg/dL (ref 8.9–10.3)
Chloride: 99 mmol/L (ref 98–111)
Creatinine, Ser: 1.11 mg/dL — ABNORMAL HIGH (ref 0.44–1.00)
GFR, Estimated: 57 mL/min — ABNORMAL LOW (ref >60)
Glucose, Bld: 215 mg/dL — ABNORMAL HIGH (ref 70–99)
Potassium: 4 mmol/L (ref 3.5–5.1)
Sodium: 136 mmol/L (ref 135–145)

## 2022-05-13 LAB — MAGNESIUM: Magnesium: 1.5 mg/dL — ABNORMAL LOW (ref 1.7–2.4)

## 2022-05-13 LAB — GLUCOSE, CAPILLARY
Glucose-Capillary: 208 mg/dL — ABNORMAL HIGH (ref 70–99)
Glucose-Capillary: 198 mg/dL — ABNORMAL HIGH (ref 70–99)
Glucose-Capillary: 301 mg/dL — ABNORMAL HIGH (ref 70–99)
Glucose-Capillary: 332 mg/dL — ABNORMAL HIGH (ref 70–99)

## 2022-05-13 LAB — TSH: TSH: 5.959 u[IU]/mL — ABNORMAL HIGH (ref 0.350–4.500)

## 2022-05-13 LAB — CBC
HCT: 37.3 % (ref 36.0–46.0)
Hemoglobin: 11.1 g/dL — ABNORMAL LOW (ref 12.0–15.0)
MCH: 23.3 pg — ABNORMAL LOW (ref 26.0–34.0)
MCHC: 29.8 g/dL — ABNORMAL LOW (ref 30.0–36.0)
MCV: 78.4 fL — ABNORMAL LOW (ref 80.0–100.0)
Platelets: 181 10*3/uL (ref 150–400)
RBC: 4.76 MIL/uL (ref 3.87–5.11)
RDW: 19.3 % — ABNORMAL HIGH (ref 11.5–15.5)
WBC: 3.7 10*3/uL — ABNORMAL LOW (ref 4.0–10.5)
nRBC: 0 % (ref 0.0–0.2)

## 2022-05-13 MED ORDER — FUROSEMIDE 40 MG PO TABS
160.0000 mg | ORAL_TABLET | Freq: Two times a day (BID) | ORAL | Status: DC
  Administered 2022-05-13: 160 mg via ORAL
  Filled 2022-05-13: qty 4

## 2022-05-13 MED ORDER — NIFEDIPINE ER OSMOTIC RELEASE 60 MG PO TB24
60.0000 mg | ORAL_TABLET | Freq: Every day | ORAL | Status: DC
  Administered 2022-05-13 – 2022-05-17 (×5): 60 mg via ORAL
  Filled 2022-05-13 (×5): qty 1

## 2022-05-13 MED ORDER — LEVOTHYROXINE SODIUM 25 MCG PO TABS
125.0000 ug | ORAL_TABLET | Freq: Every day | ORAL | Status: DC
  Administered 2022-05-14: 125 ug via ORAL
  Filled 2022-05-13: qty 1

## 2022-05-13 MED ORDER — INSULIN ASPART 100 UNIT/ML IJ SOLN
3.0000 [IU] | Freq: Three times a day (TID) | INTRAMUSCULAR | Status: DC
  Administered 2022-05-14 – 2022-05-17 (×10): 3 [IU] via SUBCUTANEOUS

## 2022-05-13 MED ORDER — MAGNESIUM SULFATE 2 GM/50ML IV SOLN
2.0000 g | Freq: Once | INTRAVENOUS | Status: AC
Start: 2022-05-13 — End: 2022-05-13
  Administered 2022-05-13: 2 g via INTRAVENOUS
  Filled 2022-05-13: qty 50

## 2022-05-13 MED ORDER — FUROSEMIDE 10 MG/ML IJ SOLN
80.0000 mg | Freq: Two times a day (BID) | INTRAMUSCULAR | Status: DC
  Administered 2022-05-13 – 2022-05-16 (×6): 80 mg via INTRAVENOUS
  Filled 2022-05-13 (×6): qty 8

## 2022-05-13 NOTE — Progress Notes (Signed)
PT Cancellation Note  Patient Details Name: [name] MRN: [id number] DOB: 02/02/1961   Cancelled Treatment:    Reason Eval/Treat Not Completed: Medical issues which prohibited therapy. Pt declines PT twice on this date, reporting diarrhea which limits mobility. PT will attempt to follow up as time allows.  Zenaida Niece 05/13/2022, 4:37 PM

## 2022-05-13 NOTE — Progress Notes (Signed)
Mobility Specialist Progress Note    05/13/22 1043  Mobility  Activity Ambulated with assistance in hallway  Level of Assistance Standby assist, set-up cues, supervision of patient - no hands on  Assistive Device Front wheel walker  Distance Ambulated (ft) 470 ft  Activity Response Tolerated well  $Mobility charge 1 Mobility   Pt received in bed and agreeable. C/o some SOB upon return but stated she was walking faster than normal to get it over with. On 3LO2. Returned to chair at sink to wash-up with call bell in reach. RN and NT aware.   Hildred Alamin Mobility Specialist

## 2022-05-13 NOTE — Progress Notes (Signed)
HD#1 Subjective:   Summary:  [name] is a 61 year old female with a past medical history of CAD s/p CABG on 03/16/2022, chronic respiratory failure secondary to COVID-pneumonia in 2020 on 3 L O2 at home, ESRD s/p transplant on immunosuppression, HFpEF, prior DVT on Eliquis, T2DM, and HLD who presented with worsening dyspnea and is admitted for acute heart failure exacerbation.  Overnight Events: No acute events  Patient is continuing to do well and is requiring less oxygen.  She denies any new or worsening complaints.  She is on her home 3 L by nasal cannula and states that her breathing and swelling have improved.  Objective:  Vital signs in last 24 hours: Vitals:   05/12/22 1648 05/12/22 2000 05/13/22 0111 05/13/22 0705  BP: (!) 137/59 (!) 152/49 (!) 129/40 (!) 155/61  Pulse:  93 93 91  Resp:  18 18 18   Temp:  98.4 F (36.9 C) 98.4 F (36.9 C) 98.4 F (36.9 C)  TempSrc:  Oral Oral Oral  SpO2:  90%  (!) 87%  Weight:      Height:       Supplemental O2: Nasal Cannula SpO2: (!) 87 % O2 Flow Rate (L/min): 3 L/min   Physical Exam:  Constitutional: well-appearing female sitting in bed, in no acute distress HENT: normocephalic atraumatic Neck: supple, JVD just above the clavicle Cardiovascular: regular rate and rhythm Pulmonary/Chest: normal work of breathing on room air, mild crackles more prominent in bases Abdominal: soft, non-tender, non-distended MSK: normal bulk and tone, trace edema in the lower extremities bilaterally Neurological: alert & oriented x 3 Skin: warm and dry  Filed Weights   05/11/22 0112 05/12/22 0631  Weight: 90.1 kg 88.5 kg     Intake/Output Summary (Last 24 hours) at 05/13/2022 1325 Last data filed at 05/13/2022 0651 Gross per 24 hour  Intake 240 ml  Output 701 ml  Net -461 ml   Net IO Since Admission: -1,286.64 mL [05/13/22 1325]  Pertinent Labs:    Latest Ref Rng & Units 05/13/2022    4:58 AM 05/12/2022    6:36 AM 05/11/2022     4:19 AM  CBC  WBC 4.0 - 10.5 K/uL 3.7  3.2  3.9   Hemoglobin 12.0 - 15.0 g/dL 11.1  10.7  10.7   Hematocrit 36.0 - 46.0 % 37.3  34.8  38.2   Platelets 150 - 400 K/uL 181  174  184        Latest Ref Rng & Units 05/13/2022    4:58 AM 05/12/2022    6:36 AM 05/11/2022    2:15 PM  CMP  Glucose 70 - 99 mg/dL 215  149  157   BUN 8 - 23 mg/dL 12  10  9    Creatinine 0.44 - 1.00 mg/dL 1.11  0.95  0.68   Sodium 135 - 145 mmol/L 136  134  134   Potassium 3.5 - 5.1 mmol/L 4.0  4.2  3.6   Chloride 98 - 111 mmol/L 99  99  104   CO2 22 - 32 mmol/L 26  25  21    Calcium 8.9 - 10.3 mg/dL 9.1  8.7  7.1   Total Protein 6.5 - 8.1 g/dL  6.4    Total Bilirubin 0.3 - 1.2 mg/dL  0.8    Alkaline Phos 38 - 126 U/L  89    AST 15 - 41 U/L  25    ALT 0 - 44 U/L  14  Imaging: No results found.  Assessment/Plan:   Principal Problem:   Acute on chronic diastolic (congestive) heart failure (HCC) Active Problems:   Essential hypertension: Associated with renal disease   Morbid obesity (HCC)   Diabetes mellitus, type II, insulin dependent ([place])   Acute on chronic respiratory failure with hypoxemia (HCC)   Acute cystitis with hematuria   Kidney transplant status, cadaveric   Hyponatremia   Iron deficiency anemia   Patient Summary: [name] is a 61 year old female with a past medical history of CAD s/p CABG on 03/16/2022, chronic respiratory failure secondary to COVID-pneumonia in 2020 on 3 L O2 at home, ESRD s/p transplant on immunosuppression, HFpEF, prior DVT on Eliquis, T2DM, and HLD who presented with worsening dyspnea and is admitted for acute heart failure exacerbation.     Acute exacerbation of HFpEF NYHA stage D class III Acute on chronic hypoxic respiratory failure 2/2 COVID-pneumonia in 2020 Responded well to increased dose of IV Lasix 80 mg and is much less edematous today.  She is also not requiring any additional supplemental oxygen and is on her home 3 L.  She does continue to have  some mild crackles throughout her lungs and due to her recent history with heart failure exacerbations and changing Lasix dose we will keep her to ensure an adequate home dose is prescribed on discharge. -Lasix 160 mg oral with plan to decrease to 80 mg oral and hopefully discharge on 40 mg oral daily. 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  She is on chronic Eliquis which she has taken consistently and has not run out of.  There are EKG findings as above which are concerning for possible PE.  CT PE protocol showed no evidence of pulmonary embolism. - Continue Eliquis 5 mg twice daily   Recurrent UTI She has had recurrent UTIs after renal transplant.  She presented to the emergency department several hours prior to returning for this admission with complaints of back pain and dysuria. Urinalysis showed rare bacteria, few leukocytes, and budding yeast.  She was given a dose of Diflucan and Keflex before being discharged from the emergency department.  Urine culture grew Klebsiella pneumoniae. She then returned with increasing dyspnea and continued dysuria.  We repeated the UA and got a urine culture.  UA here is clean and culture is pending. Chart review showed a culture on 04/19/2022 grew Klebsiella pneumonia with resistance to Bactrim, nitrofurantoin, and ampicillin but sensitivity to cephalosporins.  We started her on cefdinir 300 mg twice a day for 7-day course, last day 05/18/2022.  She will continue her Bactrim as below with her immunosuppressive therapy.   CAD s/p CABG Hypertension Hyperlipidemia CABG x 3 (LIMA to LAD, SVG to OM, SVG to PDA) on 03/16/2022.  Troponins are negative.  No new or worsening chest  pain. - Continue home nifedipine 60 mg daily, Coreg 12.5 mg twice daily, atorvastatin 10 mg daily, aspirin 81 mg daily   ESRD s/p renal transplant ESRD due to diabetic neuropathy.  Creatinine 1.02 on admission which appears to be her baseline, creatinine of 1.11 today.  We may need further verification on her Bactrim dosing as it is single strength with the recommendation for  PCP prophylaxis is double strength for her current 3 times weekly dosing. - Continue tacrolimus, mycophenolate, Bactrim MWF, and prednisone.   T2DM with neuropathy A1c of 6.7% on 03/10/2022.  Home regimen of glargine 15 units daily and sliding scale.   -SSI and Semglee 15 units -Gabapentin 300 mg 3 times daily, Percocet 7.5, and Flexeril 10 mg as needed   Normocytic anemia Mild leukopenia Hemoglobin of 10.7 and leukocytes at 3.9.  Likely secondary to malnutrition, chronic diseases, and immunosuppressive therapy.  Iron of 26, saturation of 11, ferritin of 534. - 2x IV iron complete and continue oral iron supplementation   Hypothyroidism Patient appears to be on levothyroxine 100 mcg daily outpatient.  TSH on 04/06/2022 was 1.525.  TSH here was 5.959.  Majority of this increase is likely due to acute illness.  We we will decrease her levothyroxine to 100 mcg daily as to match her outpatient dosing.   Diet: Heart Healthy IVF: None,None VTE: DOAC Code: Full PT/OT recs: Home Health, none.   Dispo: Anticipated discharge to Home in 1-2 days pending further evaluation and management of her acute dyspnea.   [facility] Resident PGY-1 Pager: [phone]  Please contact the on call pager after 5 pm and on weekends at [phone].

## 2022-05-14 DIAGNOSIS — I5033 Acute on chronic diastolic (congestive) heart failure: Secondary | ICD-10-CM | POA: Diagnosis not present

## 2022-05-14 DIAGNOSIS — D72819 Decreased white blood cell count, unspecified: Secondary | ICD-10-CM

## 2022-05-14 DIAGNOSIS — E1121 Type 2 diabetes mellitus with diabetic nephropathy: Secondary | ICD-10-CM | POA: Diagnosis not present

## 2022-05-14 DIAGNOSIS — N390 Urinary tract infection, site not specified: Secondary | ICD-10-CM

## 2022-05-14 DIAGNOSIS — J9621 Acute and chronic respiratory failure with hypoxia: Secondary | ICD-10-CM | POA: Diagnosis not present

## 2022-05-14 LAB — GLUCOSE, CAPILLARY
Glucose-Capillary: 240 mg/dL — ABNORMAL HIGH (ref 70–99)
Glucose-Capillary: 294 mg/dL — ABNORMAL HIGH (ref 70–99)
Glucose-Capillary: 282 mg/dL — ABNORMAL HIGH (ref 70–99)
Glucose-Capillary: 294 mg/dL — ABNORMAL HIGH (ref 70–99)

## 2022-05-14 LAB — CBC
HCT: 38.4 % (ref 36.0–46.0)
Hemoglobin: 11.8 g/dL — ABNORMAL LOW (ref 12.0–15.0)
MCH: 23.6 pg — ABNORMAL LOW (ref 26.0–34.0)
MCHC: 30.7 g/dL (ref 30.0–36.0)
MCV: 76.6 fL — ABNORMAL LOW (ref 80.0–100.0)
Platelets: 189 10*3/uL (ref 150–400)
RBC: 5.01 MIL/uL (ref 3.87–5.11)
RDW: 19.6 % — ABNORMAL HIGH (ref 11.5–15.5)
WBC: 4.9 10*3/uL (ref 4.0–10.5)
nRBC: 0 % (ref 0.0–0.2)

## 2022-05-14 LAB — T4, FREE: Free T4: 0.78 ng/dL (ref 0.61–1.12)

## 2022-05-14 LAB — BASIC METABOLIC PANEL
Anion gap: 12 (ref 5–15)
BUN: 17 mg/dL (ref 8–23)
CO2: 27 mmol/L (ref 22–32)
Calcium: 9.3 mg/dL (ref 8.9–10.3)
Chloride: 97 mmol/L — ABNORMAL LOW (ref 98–111)
Creatinine, Ser: 0.95 mg/dL (ref 0.44–1.00)
GFR, Estimated: >60 mL/min (ref >60)
Glucose, Bld: 217 mg/dL — ABNORMAL HIGH (ref 70–99)
Potassium: 3.9 mmol/L (ref 3.5–5.1)
Sodium: 136 mmol/L (ref 135–145)

## 2022-05-14 LAB — MAGNESIUM: Magnesium: 1.6 mg/dL — ABNORMAL LOW (ref 1.7–2.4)

## 2022-05-14 MED ORDER — LEVOTHYROXINE SODIUM 100 MCG PO TABS
100.0000 ug | ORAL_TABLET | Freq: Every day | ORAL | Status: DC
  Administered 2022-05-15 – 2022-05-17 (×3): 100 ug via ORAL
  Filled 2022-05-14 (×3): qty 1

## 2022-05-14 MED ORDER — MAGNESIUM SULFATE 2 GM/50ML IV SOLN
2.0000 g | Freq: Once | INTRAVENOUS | Status: AC
Start: 2022-05-14 — End: 2022-05-14
  Administered 2022-05-14: 2 g via INTRAVENOUS
  Filled 2022-05-14: qty 50

## 2022-05-14 MED ORDER — CEFDINIR 300 MG PO CAPS
300.0000 mg | ORAL_CAPSULE | Freq: Two times a day (BID) | ORAL | Status: AC
Start: 2022-05-14 — End: 2022-05-14
  Administered 2022-05-14 (×2): 300 mg via ORAL
  Filled 2022-05-14 (×2): qty 1

## 2022-05-14 MED ORDER — INSULIN ASPART 100 UNIT/ML IJ SOLN
0.0000 [IU] | Freq: Three times a day (TID) | INTRAMUSCULAR | Status: DC
  Administered 2022-05-14 (×2): 5 [IU] via SUBCUTANEOUS
  Administered 2022-05-15: 2 [IU] via SUBCUTANEOUS
  Administered 2022-05-15: 3 [IU] via SUBCUTANEOUS
  Administered 2022-05-15: 9 [IU] via SUBCUTANEOUS
  Administered 2022-05-16: 5 [IU] via SUBCUTANEOUS

## 2022-05-14 NOTE — Progress Notes (Signed)
PT Cancellation Note  Patient Details Name: [name] MRN: [id number] DOB: 1960/10/04   Cancelled Treatment:    Reason Eval/Treat Not Completed: Patient declined, no reason specified;PT screened, no needs identified, will sign off. Pt refusing PT treatment at this time, reporting she is at her baseline and does not require any further assistance with mobility. Pt in agreement with PT plans to sign off. Pt is encouraged to frequently ambulate both within and out of the room. Please re-consult if mobility concerns arise.   Zenaida Niece 05/14/2022, 12:43 PM

## 2022-05-14 NOTE — Progress Notes (Signed)
Mobility Specialist - Progress Note   05/14/22 1120  Mobility  Activity Refused mobility   Pt refused mobility stating she is "mobile enough". Will f/u if time permits.  Larey Seat

## 2022-05-14 NOTE — Care Management Important Message (Signed)
Important Message  Patient Details  Name: [name] MRN: [id number] Date of Birth: 21-Jun-1961   Medicare Important Message Given:  Yes     Shelda Altes 05/14/2022, 10:20 AM

## 2022-05-14 NOTE — Progress Notes (Signed)
HD#2 Subjective:   Summary:  [name] is a 61 year old female with a past medical history of CAD s/p CABG on 03/16/2022, chronic respiratory failure secondary to COVID-pneumonia in 2020 on 3 L O2 at home, ESRD s/p transplant on immunosuppression, HFpEF, prior DVT on Eliquis, T2DM, and HLD who presented with worsening dyspnea and is admitted for acute heart failure exacerbation.  Overnight Events: No acute overnight events.  She is continuing to do well today, feels somewhat less SHOB. Asking what her home regimen of lasix will be at discharge, discussed that we will need to get her to a dry state before deciding this. She is not having any pain at this time. Eating fine. No other questions or concerns at this time.  Objective:  Vital signs in last 24 hours: Vitals:   05/13/22 2123 05/14/22 0446 05/14/22 0517 05/14/22 0720  BP: (!) 140/42 (!) 135/49  (!) 148/56  Pulse: 92 97  97  Resp: 18 18  20   Temp: 98.5 F (36.9 C) 98.5 F (36.9 C)  98.3 F (36.8 C)  TempSrc: Oral Oral  Oral  SpO2: 92% 95%  90%  Weight:   88.3 kg   Height:       Supplemental O2: Nasal Cannula SpO2: 90 % O2 Flow Rate (L/min): 4 L/min   Physical Exam:  General: well-appearing female sitting in bed, NAD. Neck: JVD to ear CV: normal rate and regular rhythm, no m/r/g. Pulm: crackles at lung base (L>R), normal WOB on RA. MSK: 2+ pitting edema in bilateral LE.  Neuro: AAOx3, no focal deficits. Skin: warm and dry. Psych: pleasant mood and affect  Filed Weights   05/11/22 0112 05/12/22 0631 05/14/22 0517  Weight: 90.1 kg 88.5 kg 88.3 kg     Intake/Output Summary (Last 24 hours) at 05/14/2022 1236 Last data filed at 05/14/2022 1009 Gross per 24 hour  Intake 180 ml  Output 2900 ml  Net -2720 ml   Net IO Since Admission: -3,766.64 mL [05/14/22 1236]  Pertinent Labs:    Latest Ref Rng & Units 05/14/2022    6:32 AM 05/13/2022    4:58 AM 05/12/2022    6:36 AM  CBC  WBC 4.0 - 10.5 K/uL 4.9  3.7   3.2   Hemoglobin 12.0 - 15.0 g/dL 11.8  11.1  10.7   Hematocrit 36.0 - 46.0 % 38.4  37.3  34.8   Platelets 150 - 400 K/uL 189  181  174        Latest Ref Rng & Units 05/14/2022    6:32 AM 05/13/2022    4:58 AM 05/12/2022    6:36 AM  CMP  Glucose 70 - 99 mg/dL 217  215  149   BUN 8 - 23 mg/dL 17  12  10    Creatinine 0.44 - 1.00 mg/dL 0.95  1.11  0.95   Sodium 135 - 145 mmol/L 136  136  134   Potassium 3.5 - 5.1 mmol/L 3.9  4.0  4.2   Chloride 98 - 111 mmol/L 97  99  99   CO2 22 - 32 mmol/L 27  26  25    Calcium 8.9 - 10.3 mg/dL 9.3  9.1  8.7   Total Protein 6.5 - 8.1 g/dL   6.4   Total Bilirubin 0.3 - 1.2 mg/dL   0.8   Alkaline Phos 38 - 126 U/L   89   AST 15 - 41 U/L   25   ALT 0 - 44 U/L  14     Assessment/Plan:   Principal Problem:   Acute on chronic diastolic (congestive) heart failure (HCC) Active Problems:   Essential hypertension: Associated with renal disease   Morbid obesity ([place])   Diabetes mellitus, type II, insulin dependent ([place])   Acute on chronic respiratory failure with hypoxemia (HCC)   Acute cystitis with hematuria   Kidney transplant status, cadaveric   Hyponatremia   Iron deficiency anemia   Patient Summary: [name] is a 61 year old female with a past medical history of CAD s/p CABG on 03/16/2022, chronic respiratory failure secondary to COVID-pneumonia in 2020 on 3 L O2 at home, ESRD s/p transplant on immunosuppression, HFpEF, prior DVT on Eliquis, T2DM, and HLD who presented with worsening dyspnea and is admitted for acute heart failure exacerbation.     Acute exacerbation of HFpEF NYHA stage D class III Chronic hypoxic respiratory failure 2/2 COVID PNA (2020) vs HFpEF Patient with good response to IV lasix 80mg , had about 2.4L UOP over past 24 hours. She is still significantly volume overloaded on exam, likely reflecting an erroneous prior dry weight. Wonder if chronic hypervolemic state is contributing to her chronic hypoxic respiratory  failure. She is back on her home 3L today, satting >92%. Hopeful that we may be able to continue to wean her supplemental O2. Will also need to determine appropriate home lasix dose to ensure she stays euvolemic after discharge. 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  She is on chronic Eliquis. CT PE protocol showed no evidence of pulmonary embolism. - Continue Eliquis 5 mg twice daily   Recurrent UTI She has had recurrent UTIs after renal transplant.  UA clean and urine culture negative, but these were collected after receiving ceftriaxone. Prior urine cx 7/31 grew Klebsiella pneumonia, sensitive to cephalosporins. Started current treatment on 8/20 with cefdinir BID, last dose today. -last dose of cefdinir BID today -continue bactrim for immunosuppressive therapy   CAD s/p CABG Hypertension Hyperlipidemia CABG x 3 (LIMA to LAD, SVG to OM, SVG to PDA) on 03/16/2022.  Troponins negative.  No chest pain currently. - Continue home nifedipine 60 mg daily, Coreg 12.5 mg twice daily, atorvastatin 10 mg daily, aspirin 81 mg daily   ESRD s/p DDRT (07/14/2018) On chronic immunosuppressive therapy ESRD due to diabetic neuropathy.  Creatinine 1.02 on admission which appears to be her baseline, creatinine of 0.95 today.  We may need further verification on her Bactrim dosing as it is single strength with the recommendation for PCP prophylaxis is double strength for her current 3 times weekly dosing. - Continue tacrolimus, mycophenolate, Bactrim MWF, and prednisone.   T2DM with neuropathy A1c of 6.7% on 03/10/2022.  Home regimen of glargine 15 units daily and sliding scale.  CBGs 250s-300s overnight. -Semglee 15u daily,  novolog 3u TID with meals, sensitive SSI -Gabapentin 300 mg 3 times daily, oxycodone 7.5mg  q6 prn, and Flexeril 10 mg qhs prn   Normocytic anemia Mild leukopenia Hemoglobin of 10.7 and leukocytes at 3.9.  Likely secondary to malnutrition, chronic diseases, and immunosuppressive therapy.  Iron of 26, saturation of 11, ferritin of 534. - 2x IV iron complete and continue oral iron supplementation   Hypothyroidism Patient appears to be on  levothyroxine 100 mcg daily outpatient.  TSH on 04/06/2022 was 1.525.  TSH here was 5.959. Free T4 normal, free T3 pending.  -on synthroid 171mcg daily   Diet: Heart Healthy IVF: None,None VTE: DOAC Code: Full PT/OT recs: Home Health, none.   Dispo: Anticipated discharge to Home in 1-2 days pending further evaluation and management of her acute dyspnea.   Virl Axe, MD Internal Medicine Resident PGY-3 Pager: [phone]  Please contact the on call pager after 5 pm on weekdays and after 3pm on weekends at [phone].

## 2022-05-15 LAB — GLUCOSE, CAPILLARY
Glucose-Capillary: 176 mg/dL — ABNORMAL HIGH (ref 70–99)
Glucose-Capillary: 367 mg/dL — ABNORMAL HIGH (ref 70–99)
Glucose-Capillary: 207 mg/dL — ABNORMAL HIGH (ref 70–99)
Glucose-Capillary: 268 mg/dL — ABNORMAL HIGH (ref 70–99)

## 2022-05-15 LAB — BASIC METABOLIC PANEL WITH GFR
Anion gap: 11 (ref 5–15)
BUN: 17 mg/dL (ref 8–23)
CO2: 25 mmol/L (ref 22–32)
Calcium: 9.2 mg/dL (ref 8.9–10.3)
Chloride: 98 mmol/L (ref 98–111)
Creatinine, Ser: 0.82 mg/dL (ref 0.44–1.00)
GFR, Estimated: >60 mL/min
Glucose, Bld: 192 mg/dL — ABNORMAL HIGH (ref 70–99)
Potassium: 3.6 mmol/L (ref 3.5–5.1)
Sodium: 134 mmol/L — ABNORMAL LOW (ref 135–145)

## 2022-05-15 LAB — T3, FREE: T3, Free: 1.5 pg/mL — ABNORMAL LOW (ref 2.0–4.4)

## 2022-05-15 LAB — MAGNESIUM: Magnesium: 1.7 mg/dL (ref 1.7–2.4)

## 2022-05-15 MED ORDER — MAGNESIUM SULFATE 2 GM/50ML IV SOLN
2.0000 g | Freq: Once | INTRAVENOUS | Status: AC
  Administered 2022-05-15: 2 g via INTRAVENOUS
  Filled 2022-05-15: qty 50

## 2022-05-15 MED ORDER — POTASSIUM CHLORIDE CRYS ER 20 MEQ PO TBCR
40.0000 meq | EXTENDED_RELEASE_TABLET | Freq: Once | ORAL | Status: AC
  Administered 2022-05-15: 40 meq via ORAL
  Filled 2022-05-15: qty 2

## 2022-05-15 MED ORDER — DICLOFENAC SODIUM 1 % EX GEL
2.0000 g | Freq: Three times a day (TID) | CUTANEOUS | Status: DC
  Administered 2022-05-15 – 2022-05-17 (×5): 2 g via TOPICAL
  Filled 2022-05-15: qty 100

## 2022-05-15 NOTE — Progress Notes (Addendum)
HD#3 Subjective:   Summary: [name] is a 61 year old female with a past medical history of CAD s/p CABG on 03/16/2022, chronic respiratory failure secondary to COVID-pneumonia in 2020 on 3 L O2 at home, ESRD s/p transplant on immunosuppression, HFpEF, prior DVT on Eliquis, T2DM, and HLD who presented with worsening dyspnea and is admitted for acute heart failure exacerbation.  Overnight Events: No acute events   Resting in bed comfortably. Continues to urinate frequently and has had improvement in dyspnea and lower extremity swelling. Discussed continuation of diuresis.  Does endorse left sided chest wall pain that is reproducible. She did notice on the right side, however, this resolved and now noticing on the left.   Objective:  Vital signs in last 24 hours: Vitals:   05/14/22 1929 05/15/22 0614 05/15/22 0655 05/15/22 0748  BP: (!) 142/59 139/69  (!) 172/71  Pulse: 96 94  95  Resp: 18 17  18   Temp: 98.3 F (36.8 C) 98.1 F (36.7 C)  98.1 F (36.7 C)  TempSrc: Oral Oral  Oral  SpO2: 94% 92%  91%  Weight:   85.5 kg   Height:       Supplemental O2: Nasal Cannula SpO2: 91 % O2 Flow Rate (L/min): 4 L/min  Physical Exam:  Constitutional: no acute distress HENT: normocephalic atraumatic Eyes: conjunctiva non-erythematous Neck: supple Cardiovascular: regular rate and rhythm, 3/6 systolic murmur RUS border as well as 2/6 systolic murmur JVD to angle of mandible Pulmonary/Chest: normal work of breathing on room air, lungs clear to auscultation bilaterally Abdominal: soft, non-tender, non-distended MSK: normal bulk and tone. Chest wall tender to palpation Neurological: alert & oriented x 3 Skin: warm and dry Psych: normal mood  Filed Weights   05/12/22 0631 05/14/22 0517 05/15/22 0655  Weight: 88.5 kg 88.3 kg 85.5 kg     Intake/Output Summary (Last 24 hours) at 05/15/2022 1121 Last data filed at 05/15/2022 5102 Gross per 24 hour  Intake 720 ml  Output 2450 ml  Net  -1730 ml   Net IO Since Admission: -5,496.64 mL [05/15/22 1121]  Pertinent Labs:    Latest Ref Rng & Units 05/14/2022    6:32 AM 05/13/2022    4:58 AM 05/12/2022    6:36 AM  CBC  WBC 4.0 - 10.5 K/uL 4.9  3.7  3.2   Hemoglobin 12.0 - 15.0 g/dL 11.8  11.1  10.7   Hematocrit 36.0 - 46.0 % 38.4  37.3  34.8   Platelets 150 - 400 K/uL 189  181  174        Latest Ref Rng & Units 05/15/2022    6:25 AM 05/14/2022    6:32 AM 05/13/2022    4:58 AM  CMP  Glucose 70 - 99 mg/dL 192  217  215   BUN 8 - 23 mg/dL 17  17  12    Creatinine 0.44 - 1.00 mg/dL 0.82  0.95  1.11   Sodium 135 - 145 mmol/L 134  136  136   Potassium 3.5 - 5.1 mmol/L 3.6  3.9  4.0   Chloride 98 - 111 mmol/L 98  97  99   CO2 22 - 32 mmol/L 25  27  26    Calcium 8.9 - 10.3 mg/dL 9.2  9.3  9.1     Imaging: No results found.  Assessment/Plan:   Principal Problem:   Acute on chronic diastolic (congestive) heart failure (HCC) Active Problems:   Essential hypertension: Associated with renal disease   Morbid  obesity ([place])   Diabetes mellitus, type II, insulin dependent ([place])   Acute on chronic respiratory failure with hypoxemia (HCC)   Acute cystitis with hematuria   Kidney transplant status, cadaveric   Hyponatremia   Iron deficiency anemia  Patient Summary: [name] is a 61 year old female with a past medical history of CAD s/p CABG on 03/16/2022, chronic respiratory failure secondary to COVID-pneumonia in 2020 on 3 L O2 at home, ESRD s/p transplant on immunosuppression, HFpEF, prior DVT on Eliquis, T2DM, and HLD who presented with worsening dyspnea and is admitted for acute heart failure exacerbation.  Acute exacerbation of HFpEF NYHA stage D class III Chronic hypoxic respiratory failure Continues to diuresis well with 80 lasix IV. JVD to edge of mandible today from ear. Will continue diuresis and repletion of electrolytes. Saturating on home 3L over 90%. Down 6 kg from admission. Will trial torsemide prior to  discharge.  -continue IV lasix 80mg  BID -Strict I/O and daily weights, goal -2L daily -daily BMP and mg  Systolic Murmur - Right upper sternal border On this admission patient with evidence of right upper sternal border murmur. No evidence of valvular disease on echocardiogram. If still present after continued diuresis will consider repeat echocardiogram.   ESRD s/p renal transplant ESRD due to diabetic neuropathy. Cr at baseline near 1. Question about increasing bactrim dosing to DS, will continue looking through charts to determine why only SS.  - Continue tacrolimus, mycophenolate, Bactrim MWF, and prednisone.  CAD s/p CABG Hypertension Hyperlipidemia CABG x 3 (LIMA to LAD, SVG to OM, SVG to PDA) on 03/16/2022. - Continue home nifedipine 60 mg daily, Coreg 12.5 mg twice daily, atorvastatin 10 mg daily, aspirin 81 mg daily  Chest Wall Pain Reproducible with palpitation. Will attempt topical therapies for now. Do not suspect ACS, pericarditis. Likely secondary to recent CABG.    T2DM with neuropathy A1c of 6.7% on 03/10/2022.  -SSI and Semglee 15 units -Gabapentin 300 mg 3 times daily, Percocet 7.5, and Flexeril 10 mg as needed  Hypothyroidism Continue levothyroxine 100 mcg daily  Normocytic anemia Mild leukopenia Hemoglobin of 10.7, iron studies consistent with IDA. S/p IV iron.  -continue oral iron supplementation  Bacteruria 50,000 colonies of klebsiella pneumonia, sensitive to cephasporins. She has completed three day course of cefdinir.    Diet: Normal IVF: None,None VTE: DOAC Code: Full PT/OT recs: None  Dispo: Anticipated discharge to Home in 2-3 days pending further diuresis   [place] Internal Medicine Resident PGY-3 Please contact the on call pager after 5 pm and on weekends at [phone].

## 2022-05-16 LAB — GLUCOSE, CAPILLARY
Glucose-Capillary: 252 mg/dL — ABNORMAL HIGH (ref 70–99)
Glucose-Capillary: 268 mg/dL — ABNORMAL HIGH (ref 70–99)
Glucose-Capillary: 297 mg/dL — ABNORMAL HIGH (ref 70–99)
Glucose-Capillary: 243 mg/dL — ABNORMAL HIGH (ref 70–99)

## 2022-05-16 LAB — BASIC METABOLIC PANEL
Anion gap: 10 (ref 5–15)
BUN: 18 mg/dL (ref 8–23)
CO2: 26 mmol/L (ref 22–32)
Calcium: 9 mg/dL (ref 8.9–10.3)
Chloride: 94 mmol/L — ABNORMAL LOW (ref 98–111)
Creatinine, Ser: 0.9 mg/dL (ref 0.44–1.00)
GFR, Estimated: >60 mL/min (ref >60)
Glucose, Bld: 284 mg/dL — ABNORMAL HIGH (ref 70–99)
Potassium: 3.8 mmol/L (ref 3.5–5.1)
Sodium: 130 mmol/L — ABNORMAL LOW (ref 135–145)

## 2022-05-16 LAB — MAGNESIUM: Magnesium: 1.8 mg/dL (ref 1.7–2.4)

## 2022-05-16 MED ORDER — LIDOCAINE 5 % EX PTCH
1.0000 | MEDICATED_PATCH | CUTANEOUS | Status: DC
  Administered 2022-05-16: 1 via TRANSDERMAL
  Filled 2022-05-16: qty 1

## 2022-05-16 MED ORDER — SODIUM CHLORIDE 0.9 % IV SOLN: INTRAVENOUS | Status: DC | PRN

## 2022-05-16 MED ORDER — MAGNESIUM SULFATE 2 GM/50ML IV SOLN
2.0000 g | Freq: Once | INTRAVENOUS | Status: AC
  Administered 2022-05-16: 2 g via INTRAVENOUS
  Filled 2022-05-16: qty 50

## 2022-05-16 MED ORDER — TORSEMIDE 20 MG PO TABS
20.0000 mg | ORAL_TABLET | Freq: Once | ORAL | Status: AC
  Administered 2022-05-16: 20 mg via ORAL
  Filled 2022-05-16: qty 1

## 2022-05-16 MED ORDER — INSULIN GLARGINE-YFGN 100 UNIT/ML ~~LOC~~ SOLN
20.0000 [IU] | Freq: Every day | SUBCUTANEOUS | Status: DC
Start: 2022-05-16 — End: 2022-05-17
  Administered 2022-05-16: 20 [IU] via SUBCUTANEOUS
  Filled 2022-05-16 (×2): qty 0.2

## 2022-05-16 MED ORDER — INSULIN ASPART 100 UNIT/ML IJ SOLN
0.0000 [IU] | Freq: Three times a day (TID) | INTRAMUSCULAR | Status: DC
  Administered 2022-05-16: 6 [IU] via SUBCUTANEOUS
  Administered 2022-05-16: 8 [IU] via SUBCUTANEOUS
  Administered 2022-05-17: 2 [IU] via SUBCUTANEOUS
  Administered 2022-05-17: 8 [IU] via SUBCUTANEOUS

## 2022-05-16 NOTE — Progress Notes (Signed)
HD#4 Subjective:   Summary: [name] is a 61 year old female with a past medical history of CAD s/p CABG on 03/16/2022, chronic respiratory failure secondary to COVID-pneumonia in 2020 on 3 L O2 at home, ESRD s/p transplant on immunosuppression, HFpEF, prior DVT on Eliquis, T2DM, and HLD who presented with worsening dyspnea and is admitted for acute heart failure exacerbation.  Overnight Events: No acute events   Resting in bed comfortably, continues to have adequate urinary output. Has had left sided chest wall pain, this pain has been present since CABG two months ago. She has been up in recliner and ambulating without difficulty. She is frustrated with continued hospitalization. Extended conversation had with team in regards to plan moving forward to continue diuresis with plan to transition to PO torsemide and assure adequate diuretic response. She is agreeable of this plan.   Objective:  Vital signs in last 24 hours: Vitals:   05/16/22 1214 05/16/22 1237 05/16/22 1321 05/16/22 1813  BP: (!) 161/70     Pulse: (!) 101     Resp:  18 18 18   Temp: 98.9 F (37.2 C)     TempSrc: Oral     SpO2: 90%     Weight:      Height:       Supplemental O2: Nasal Cannula SpO2: 90 % O2 Flow Rate (L/min): 4 L/min  Physical Exam:  Constitutional: no acute distress HENT: normocephalic atraumatic Eyes: conjunctiva non-erythematous Neck: supple Cardiovascular: regular rate and rhythm, 3/6 systolic murmur best heard RUS border. JVD upper 2/3 of neck.  Pulmonary/Chest: normal work of breathing on room air, bilateral lung base crackles, worse left side Abdominal: soft, non-tender, non-distended MSK: normal bulk and tone. Chest wall tender to palpation Neurological: alert & oriented x 3 Skin: warm and dry Psych: normal mood  Filed Weights   05/14/22 0517 05/15/22 0655 05/16/22 0419  Weight: 88.3 kg 85.5 kg 85.4 kg     Intake/Output Summary (Last 24 hours) at 05/16/2022 1815 Last data  filed at 05/16/2022 1217 Gross per 24 hour  Intake 600 ml  Output 2700 ml  Net -2100 ml   Net IO Since Admission: -8,056.64 mL [05/16/22 1815]  Pertinent Labs:    Latest Ref Rng & Units 05/14/2022    6:32 AM 05/13/2022    4:58 AM 05/12/2022    6:36 AM  CBC  WBC 4.0 - 10.5 K/uL 4.9  3.7  3.2   Hemoglobin 12.0 - 15.0 g/dL 11.8  11.1  10.7   Hematocrit 36.0 - 46.0 % 38.4  37.3  34.8   Platelets 150 - 400 K/uL 189  181  174        Latest Ref Rng & Units 05/16/2022    3:48 AM 05/15/2022    6:25 AM 05/14/2022    6:32 AM  CMP  Glucose 70 - 99 mg/dL 284  192  217   BUN 8 - 23 mg/dL 18  17  17    Creatinine 0.44 - 1.00 mg/dL 0.90  0.82  0.95   Sodium 135 - 145 mmol/L 130  134  136   Potassium 3.5 - 5.1 mmol/L 3.8  3.6  3.9   Chloride 98 - 111 mmol/L 94  98  97   CO2 22 - 32 mmol/L 26  25  27    Calcium 8.9 - 10.3 mg/dL 9.0  9.2  9.3     Imaging: No results found.  Assessment/Plan:   Principal Problem:   Acute on chronic  diastolic (congestive) heart failure (HCC) Active Problems:   Essential hypertension: Associated with renal disease   Morbid obesity ([place])   Diabetes mellitus, type II, insulin dependent ([place])   Acute on chronic respiratory failure with hypoxemia (HCC)   Acute cystitis with hematuria   Kidney transplant status, cadaveric   Hyponatremia   Iron deficiency anemia  Patient Summary: [name] is a 61 year old female with a past medical history of CAD s/p CABG on 03/16/2022, chronic respiratory failure secondary to COVID-pneumonia in 2020 on 3 L O2 at home, ESRD s/p transplant on immunosuppression, HFpEF, prior DVT on Eliquis, T2DM, and HLD who presented with worsening dyspnea and is admitted for acute heart failure exacerbation.  Acute exacerbation of HFpEF NYHA stage D class III Chronic hypoxic respiratory failure Continues to diuresis well with 80 lasix IV. Likely continues to be volume overloaded secondary to persistent third spacing and inadequate home  diuretic therapy. She will need close outpatient follow up with her cardiologist. JVD improved to upper neck. Weight without change in last 24h but overall down 6 lbs. Last 24 I/O -1.4L. Saturating on home 3L over 90%. Down 6 kg from admission. Will trial torsemide 20 mg this evening.  -morning IV lasix 80 mg, transition to 20 mg torsemide oral this evening.  -Strict I/O and daily weights, goal -2L daily -daily BMP and mg, supplement mag today  Systolic Murmur - Right upper sternal border On this admission patient with evidence of right upper sternal border murmur. Per patient she has had a heart murmur for many years. Found documentation from 2014 with evidence of this consistent with our findings. No evidence of valvular abnormality on echocardiogram from prior admission.   ESRD s/p renal transplant ESRD due to diabetic neuropathy. Cr at baseline near 1. Continue single strength bactrim dosing.  - Continue tacrolimus, mycophenolate, Bactrim MWF, and prednisone.  CAD s/p CABG Hypertension Hyperlipidemia CABG x 3 (LIMA to LAD, SVG to OM, SVG to PDA) on 03/16/2022. - Continue home nifedipine 60 mg daily, Coreg 12.5 mg twice daily, atorvastatin 10 mg daily, aspirin 81 mg daily  Chest Wall Pain Reproducible with palpitation. Do not suspect ACS, pericarditis. Improved with voltaren gel and analgesics. Will add lidocaine patch today.   T2DM with neuropathy A1c of 6.7% on 03/10/2022. Morning glucose of 252. Will increase semglee to 20 U -SSI and Semglee 20 units -Gabapentin 300 mg 3 times daily, Percocet 7.5, and Flexeril 10 mg as needed  Hypothyroidism Continue levothyroxine 100 mcg daily  Normocytic anemia Mild leukopenia Hemoglobin of 10.7, iron studies consistent with IDA. S/p IV iron.  -continue oral iron supplementation  Bacteruria 50,000 colonies of klebsiella pneumonia, sensitive to cephasporins. She has completed three day course of cefdinir.   Diet: Normal IVF: None,None VTE:  DOAC Code: Full PT/OT recs: None  Dispo: Anticipated discharge to Home 1-2 days  [place] Internal Medicine Resident PGY-3 Please contact the on call pager after 5 pm and on weekends at [phone].

## 2022-05-17 ENCOUNTER — Other Ambulatory Visit (HOSPITAL_COMMUNITY): Payer: Self-pay

## 2022-05-17 DIAGNOSIS — J9621 Acute and chronic respiratory failure with hypoxia: Secondary | ICD-10-CM | POA: Diagnosis not present

## 2022-05-17 DIAGNOSIS — E1121 Type 2 diabetes mellitus with diabetic nephropathy: Secondary | ICD-10-CM | POA: Diagnosis not present

## 2022-05-17 DIAGNOSIS — I5033 Acute on chronic diastolic (congestive) heart failure: Secondary | ICD-10-CM | POA: Diagnosis not present

## 2022-05-17 DIAGNOSIS — N186 End stage renal disease: Secondary | ICD-10-CM | POA: Diagnosis not present

## 2022-05-17 LAB — MAGNESIUM: Magnesium: 1.9 mg/dL (ref 1.7–2.4)

## 2022-05-17 LAB — BASIC METABOLIC PANEL
Anion gap: 13 (ref 5–15)
BUN: 18 mg/dL (ref 8–23)
CO2: 26 mmol/L (ref 22–32)
Calcium: 9.5 mg/dL (ref 8.9–10.3)
Chloride: 93 mmol/L — ABNORMAL LOW (ref 98–111)
Creatinine, Ser: 0.87 mg/dL (ref 0.44–1.00)
GFR, Estimated: >60 mL/min (ref >60)
Glucose, Bld: 141 mg/dL — ABNORMAL HIGH (ref 70–99)
Potassium: 3.7 mmol/L (ref 3.5–5.1)
Sodium: 132 mmol/L — ABNORMAL LOW (ref 135–145)

## 2022-05-17 LAB — GLUCOSE, CAPILLARY
Glucose-Capillary: 148 mg/dL — ABNORMAL HIGH (ref 70–99)
Glucose-Capillary: 291 mg/dL — ABNORMAL HIGH (ref 70–99)

## 2022-05-17 MED ORDER — DICLOFENAC SODIUM 1 % EX GEL
2.0000 g | Freq: Three times a day (TID) | CUTANEOUS | 0 refills | Status: DC
  Filled 2022-05-17: qty 100, 15d supply, fill #0

## 2022-05-17 MED ORDER — TORSEMIDE 20 MG PO TABS
40.0000 mg | ORAL_TABLET | Freq: Every day | ORAL | 0 refills | Status: AC
  Filled 2022-05-17: qty 60, 30d supply, fill #0

## 2022-05-17 MED ORDER — CARVEDILOL 12.5 MG PO TABS
12.5000 mg | ORAL_TABLET | Freq: Two times a day (BID) | ORAL | 0 refills | Status: AC
  Filled 2022-05-17: qty 60, 30d supply, fill #0

## 2022-05-17 MED ORDER — LIDOCAINE 5 % EX PTCH
1.0000 | MEDICATED_PATCH | CUTANEOUS | 0 refills | Status: DC
  Filled 2022-05-17: qty 30, 30d supply, fill #0

## 2022-05-17 NOTE — Care Management Important Message (Signed)
Important Message  Patient Details  Name: [name] MRN: [id number] Date of Birth: August 19, 1961   Medicare Important Message Given:  Yes     Shelda Altes 05/17/2022, 12:34 PM

## 2022-05-17 NOTE — Discharge Instructions (Addendum)
You were hospitalized for heart failure.  Hospital course: We treated your heart failure with medications to decrease the amount of fluid in your body.  We also treated the bacteria in your urine with antibiotics.  Medications: Please start taking: -Torsemide 40 mg (by mouth once daily) for swelling related to your heart failure - Diclofenac sodium (Voltaren gel) 1% gel for chest pain - Lidocaine (Lidoderm) 5% for your chest pain  Please change how you take: - Carvedilol (Coreg) 12.5 mg tablet (by mouth 2 times daily with a meal) for your high blood pressure  Please stop taking: - Fluconazole (Diflucan) 150 mg tablet for recent yeast infection - Furosemide (Lasix) 40 mg tablet for your heart failure - Isosorbide dinitrate (Isordil) 20 mg tablet for your high blood pressure - Metoprolol succinate (Toprol-XL) 100 mg for high blood pressure  Please continue taking: - Probiotic product (acidophilus high potency) capsule (175 mg by mouth daily) for your gut health - Amitriptyline (Elavil) 10 mg tablet (10 mg by mouth at bedtime) for sleep - Apixaban (Eliquis) 5 mg tablet (by mouth twice daily) for your coronary artery disease - Aspirin 81 mg chewable tablet (by mouth once daily) for your coronary artery disease - Atorvastatin (Lipitor) 20 mg tablet (once by mouth daily) for your high cholesterol - Cephalexin (Keflex) 500 mg capsule (by mouth twice daily) for your UTI -Cyclobenzaprine (Flexeril) 10 mg tablet (once by mouth at bedtime) for muscle spasms -Famotidine (Pepcid) 40 mg tablet (by mouth once daily) for heartburn -Ferrous sulfate 325 mg tablet (once by mouth daily) for anemia -Folic acid 1 mg tablet (once by mouth daily) for your overall health -Gabapentin 300 mg capsule (by mouth 3 times daily) for nerve pain -Insulin glargine (inject 15 units into the skin at bedtime) for your diabetes -Levothyroxine 100 mcg tablet (once daily) for hypothyroidism -Linaclotide 290 mcg capsule (by  mouth once daily) for constipation -Mycophenolate and 80 mg EC tablet (by mouth twice daily) for your kidney transplant -Nifedipine 60 mg (by mouth once daily) for high blood pressure -Nitroglycerin 0.4 mg SL tablet (placed under tongue) as needed for chest pain -NovoLog (inject 5-15 units into the skin with breakfast, lunch, evening meal) for diabetes -Oxycodone-acetaminophen 7.5-325 mg tablet (once by mouth every 8 hours as needed) for pain -Prednisone 5 mg tablet (once by mouth daily) for your kidney transplant -Promethazine 5 mg tablet (once by mouth every 8 hours as needed) for nausea -Rybelsus 7 mg (once by mouth daily) for your diabetes -Sulfamethoxazole-trimethoprim 100-80 mg (once by mouth daily) for your kidney transplant -Tacrolimus ER 1 mg (6 mg by mouth daily) for your kidney transplant -Ventolin HFA MCG/ACT inhaler (inhale 1 puff into lungs every 4 hours as needed) for wheezing or shortness of breath -Colecalciferol 50 mcg (take 2000 units by mouth daily) for bone health  Follow-up: -Please follow-up with your primary care provider Volney Presser, FNP as soon as possible when you return to [place].

## 2022-05-17 NOTE — Discharge Summary (Addendum)
Name: [name] MRN: [id number] DOB: [birth year] 61 y.o. PCP: Karina Presser, FNP  Date of Admission: 05/10/2022  3:12 PM Date of Discharge: 05/17/22 Attending Physician: Karina Falcon, MD  Discharge Diagnosis: 1. Principal Problem:   Acute on chronic diastolic (congestive) heart failure (HCC) Active Problems:   Essential hypertension: Associated with renal disease   Morbid obesity (HCC)   Diabetes mellitus, type II, insulin dependent ([place])   Acute on chronic respiratory failure with hypoxemia (HCC)   Acute cystitis with hematuria   Kidney transplant status, cadaveric   Hyponatremia   Iron deficiency anemia   Discharge Medications: Allergies as of 05/17/2022       Reactions   Lisinopril Swelling   Swelling of tongue   Hydralazine    Other reaction(s): heart palpitations        Medication List     STOP taking these medications    fluconazole 150 MG tablet Commonly known as: Diflucan   furosemide 40 MG tablet Commonly known as: LASIX   isosorbide dinitrate 20 MG tablet Commonly known as: ISORDIL   metoprolol succinate 100 MG 24 hr tablet Commonly known as: TOPROL-XL       TAKE these medications    Acidophilus High-Potency Caps Take 175 mg by mouth daily.   amitriptyline 10 MG tablet Commonly known as: ELAVIL Take 10 mg by mouth at bedtime as needed for sleep.   apixaban 5 MG Tabs tablet Commonly known as: ELIQUIS Take 5 mg by mouth 2 (two) times daily.   aspirin 81 MG chewable tablet Chew 81 mg by mouth daily.   atorvastatin 20 MG tablet Commonly known as: LIPITOR Take 20 mg by mouth daily.   carvedilol 12.5 MG tablet Commonly known as: COREG Take 1 tablet (12.5 mg total) by mouth 2 (two) times daily with a meal. What changed:  medication strength how much to take when to take this   cephALEXin 500 MG capsule Commonly known as: KEFLEX Take 1 capsule (500 mg total) by mouth 2 (two) times daily.   cyclobenzaprine 10 MG  tablet Commonly known as: FLEXERIL Take 10 mg by mouth at bedtime as needed for muscle spasms.   diclofenac Sodium 1 % Gel Commonly known as: VOLTAREN Apply 2 g topically every 8 (eight) hours.   famotidine 40 MG tablet Commonly known as: PEPCID Take 40 mg by mouth daily as needed for heartburn or indigestion.   ferrous sulfate 325 (65 FE) MG tablet Take 325 mg by mouth daily.   folic acid 1 MG tablet Commonly known as: FOLVITE Take 1 mg by mouth daily.   gabapentin 300 MG capsule Commonly known as: NEURONTIN Take 300 mg by mouth 3 (three) times daily.   Insulin Glargine w/ Trans Port 100 UNIT/ML Sopn Inject 15 Units into the skin at bedtime.   levothyroxine 100 MCG tablet Commonly known as: SYNTHROID Take 100 mcg by mouth daily before breakfast.   lidocaine 5 % Commonly known as: LIDODERM Place 1 patch onto the skin daily. Remove & Discard patch within 12 hours or as directed by MD   Linzess 290 MCG Caps capsule Generic drug: linaclotide Take 290 mcg by mouth daily as needed (constipation).   mycophenolate 180 MG EC tablet Commonly known as: MYFORTIC Take 360 mg by mouth 2 (two) times daily.   NIFEdipine 60 MG 24 hr tablet Commonly known as: ADALAT CC Take 1 tablet (60 mg total) by mouth daily.   nitroGLYCERIN 0.4 MG SL tablet Commonly known  as: NITROSTAT Place 0.4 mg under the tongue as needed.   NovoLOG FlexPen 100 UNIT/ML FlexPen Generic drug: insulin aspart Inject 5-15 Units into the skin with breakfast, with lunch, and with evening meal.   oxyCODONE-acetaminophen 7.5-325 MG tablet Commonly known as: PERCOCET Take 1 tablet by mouth every 8 (eight) hours as needed for severe pain.   predniSONE 5 MG tablet Commonly known as: DELTASONE Take 5 mg by mouth daily.   promethazine 25 MG tablet Commonly known as: PHENERGAN Take 25 mg by mouth every 8 (eight) hours as needed for nausea or vomiting.   Rybelsus 7 MG Tabs Generic drug: Semaglutide Take 7 mg  by mouth daily.   sulfamethoxazole-trimethoprim 400-80 MG tablet Commonly known as: BACTRIM Take 1 tablet by mouth See admin instructions. 1 tablet by mouth Monday/wednesdays/fridays   tacrolimus ER 1 MG Tb24 Commonly known as: ENVARSUS XR Take 6 mg by mouth daily.   torsemide 20 MG tablet Commonly known as: DEMADEX Take 2 tablets (40 mg total) by mouth daily.   Ventolin HFA 108 (90 Base) MCG/ACT inhaler Generic drug: albuterol Inhale 1 puff into the lungs every 4 (four) hours as needed for wheezing or shortness of breath.   Vitamin D 50 MCG (2000 UT) tablet Take 2,000 Units by mouth daily.        Disposition and follow-up:   [name] was discharged from [facility] in Good condition.  At the hospital follow up visit please address:  1.  Acute HFpEF exacerbation - Started on Torsemide  2.  Labs / imaging needed at time of follow-up: BMP  3.  Pending labs/ test needing follow-up: None  Follow-up Appointments:  Follow-up Information     Karina Presser, FNP Follow up on 06/08/2022.   Specialty: Internal Medicine Why: apt has been rescheduled Contact information: [place] [address] [address] Follow up.   Specialty: Internal Medicine Why: please call to make follow up apt Contact information: [place] [address] [address], [facility] Follow up.   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  Acute exacerbation of HFpEF NYHA stage D class III Acute on chronic hypoxic respiratory failure 2/2 COVID-pneumonia in 2020 Patient was recently discharged after an acute exacerbation of her heart failure on 04/27/2022.  She had originally presented after taking Lasix 20 mg every other day which appeared to be inadequate diuresis as she developed worsening edema and dyspnea. TTE at that time showed an EF of 60 to 65% and mild LVH but otherwise showed no significant abnormalities.  She was discharged with a prescription for Lasix 20 mg daily.  She then saw pulmonology who recommended she increase her dose to 40 mg twice daily 4 weeks.  They  noted that she was prescribed 40 mg daily at that time. There is no documentation available to [place] that indicates the change from 20 daily to 40 daily.  She was not able to pick up her prescription for the increased dosing and then presented to the ED initially for back pain and dysuria.  She states after being discharged from the ED she had acute onset shortness of breath which was mostly characterized by an inability to take deep breaths.  She tried rest and albuterol inhaler without relief.  She then returned to the hospital.  Evaluation on admission demonstrated BNP of 600, negative troponin X3, low iron, chest x-ray significant for pulmonary edema.  She responded well to IV Lasix 40 mg and we continued with 40 mg IV BID.  She had increased O2 needs and was on 6 L nasal cannula on admission, up from home 3 L.  EKG was significant for changes which include signs of right heart strain.  CTA PE protocol showed no evidence of acute PE.  She continued to respond well to IV diuresis and was near her dry weight prior to discharge.  IV iron infusion was given with her iron studies as below. She was transitioned to PO torsemide 40  mg daily and discharged with a prescription.    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  She is on chronic Eliquis which she has taken consistently and has not run out of.  EKG findings were concerning for possible PE. With her DVT history we obtained a CT PE protocol which showed no acute PE.  She showed no signs changes consistent with pulmonary embolism.  We continued her home dose Eliquis 5 mg twice a day.  Recurrent UTI She has had recurrent UTIs after renal transplant.  She presented to the emergency department several hours prior to returning for this admission with complaints of back pain and dysuria. Urinalysis showed rare bacteria, few leukocytes, and budding yeast. She was given a dose of Diflucan and Keflex before being discharged from the emergency department prior to admission.  Urine culture grew Klebsiella pneumoniae. She then returned with increasing dyspnea and continued dysuria.  We repeated the UA and got a urine culture.  UA here was clean and culture demonstrated insignificant growth with < 10,000 colonies. Chart review showed a culture on 04/19/2022 grew Klebsiella pneumonia with resistance to Bactrim, nitrofurantoin, and ampicillin but sensitivity to cephalosporins.  We started her on cefdinir 300 mg twice a day for 7-day course.  She will continue her Bactrim as below with her immunosuppressive therapy.   CAD s/p CABG Hypertension Hyperlipidemia CABG x 3 (LIMA to LAD, SVG to OM, SVG to PDA) on 03/16/2022.  No signs of ACS, stable atypical chest pain worked up at last admission.  Continued home medications of nifedipine 60 mg daily, Coreg 12.5 mg twice daily, atorvastatin 10 mg daily, aspirin 81 mg daily.   ESRD s/p renal transplant AKI ESRD due to diabetic neuropathy. Creatinine 1.02 on admission  down to 0.87 and stable.  AKI likely secondary to intravascular hypovolemia and poor p.o. intake.  Improved with IV diuresis and increased p.o. intake.  We continued her immunosuppressive therapy with tacrolimus, mycophenolate, Bactrim MWF, and prednisone.   T2DM with neuropathy A1c of 6.7% on 03/10/2022.  Home regimen of glargine 15 units  daily and sliding scale.  In setting of low sugars on arrival we held her long-acting insulin.  After she was able to eat more we will restart her home insulin regimen.  We continued her home regimen of gabapentin 300 mg 3 times daily, Percocet 7.5, and Flexeril 10 mg as needed.   Normocytic anemia Mild leukopenia Hemoglobin of 10.4 on admission. Leukocytes at 3.9 on 8/22.  Likely secondary to malnutrition, chronic diseases, and immunosuppressive therapy.  Iron of 26, saturation of 11, ferritin of 534.  She was given 2 rounds of IV iron and the continued oral iron supplementation.   Hypothyroidism Continued home levothyroxine 100 mcg daily.  TSH 5.9 likely elevated in the setting of acute illness.  Chest Wall Pain Reproducible and improved with voltaren gel/lidocaine patch. Unlikely to be ACS.   Discharge Exam:   BP 132/62   Pulse 96   Temp 98.2 F (36.8 C) (Oral)   Resp 18   Ht 5\' 2"  (1.575 m)   Wt 84.9 kg Comment: scale C  SpO2 93%   BMI 34.24 kg/m   Constitutional: not in acute distress Cardiovascular: regular rate and rhythm, 3/6 systolic murmur best heard RUS border. No JVD.  Pulmonary: normal work of breathing on room air. No wheezes, rales, or rhonchi.  Abdominal: soft, non-tender, non-distended MSK: normal bulk and tone. Chest wall tender to palpation. No deformity. Neurological: alert & oriented x 3 Skin: warm and dry. No rash. Psych: normal mood  Pertinent Labs, Studies, and Procedures:     Latest Ref Rng & Units 05/14/2022    6:32 AM 05/13/2022    4:58 AM 05/12/2022    6:36 AM  CBC  WBC 4.0 - 10.5 K/uL 4.9  3.7  3.2   Hemoglobin  12.0 - 15.0 g/dL 11.8  11.1  10.7   Hematocrit 36.0 - 46.0 % 38.4  37.3  34.8   Platelets 150 - 400 K/uL 189  181  174        Latest Ref Rng & Units 05/17/2022    7:04 AM 05/16/2022    3:48 AM 05/15/2022    6:25 AM  BMP  Glucose 70 - 99 mg/dL 141  284  192   BUN 8 - 23 mg/dL 18  18  17    Creatinine 0.44 - 1.00 mg/dL 0.87  0.90  0.82   Sodium 135 - 145 mmol/L 132  130  134   Potassium 3.5 - 5.1 mmol/L 3.7  3.8  3.6   Chloride 98 - 111 mmol/L 93  94  98   CO2 22 - 32 mmol/L 26  26  25    Calcium 8.9 - 10.3 mg/dL 9.5  9.0  9.2      Discharge Instructions: Discharge Instructions     Call MD for:  difficulty breathing, headache or visual disturbances   Complete by: As directed    Call MD for:  persistant dizziness or light-headedness   Complete by: As directed    Call MD for:  persistant dizziness or light-headedness   Complete by: As directed    Call MD for:  severe uncontrolled pain   Complete by: As directed    Call MD for:  severe uncontrolled pain   Complete by: As directed    Diet - low sodium heart healthy   Complete by: As directed    Diet - low sodium heart healthy   Complete by: As directed    Increase activity slowly   Complete by: As directed  Increase activity slowly   Complete by: As directed       You were hospitalized for heart failure.   Hospital course: We treated your heart failure with medications to decrease the amount of fluid in your body.  We also treated the bacteria in your urine with antibiotics.   Medications: Please start taking: -Torsemide 40 mg (by mouth once daily) for swelling related to your heart failure - Diclofenac sodium (Voltaren gel) 1% gel for chest pain - Lidocaine (Lidoderm) 5% for your chest pain   Please change how you take: - Carvedilol (Coreg) 12.5 mg tablet (by mouth 2 times daily with a meal) for your high blood pressure   Please stop taking: - Fluconazole (Diflucan) 150 mg tablet for recent yeast infection -  Furosemide (Lasix) 40 mg tablet for your heart failure - Isosorbide dinitrate (Isordil) 20 mg tablet for your high blood pressure - Metoprolol succinate (Toprol-XL) 100 mg for high blood pressure   Please continue taking: - Probiotic product (acidophilus high potency) capsule (175 mg by mouth daily) for your gut health - Amitriptyline (Elavil) 10 mg tablet (10 mg by mouth at bedtime) for sleep - Apixaban (Eliquis) 5 mg tablet (by mouth twice daily) for your coronary artery disease - Aspirin 81 mg chewable tablet (by mouth once daily) for your coronary artery disease - Atorvastatin (Lipitor) 20 mg tablet (once by mouth daily) for your high cholesterol - Cephalexin (Keflex) 500 mg capsule (by mouth twice daily) for your UTI -Cyclobenzaprine (Flexeril) 10 mg tablet (once by mouth at bedtime) for muscle spasms -Famotidine (Pepcid) 40 mg tablet (by mouth once daily) for heartburn -Ferrous sulfate 325 mg tablet (once by mouth daily) for anemia -Folic acid 1 mg tablet (once by mouth daily) for your overall health -Gabapentin 300 mg capsule (by mouth 3 times daily) for nerve pain -Insulin glargine (inject 15 units into the skin at bedtime) for your diabetes -Levothyroxine 100 mcg tablet (once daily) for hypothyroidism -Linaclotide 290 mcg capsule (by mouth once daily) for constipation -Mycophenolate and 80 mg EC tablet (by mouth twice daily) for your kidney transplant -Nifedipine 60 mg (by mouth once daily) for high blood pressure -Nitroglycerin 0.4 mg SL tablet (placed under tongue) as needed for chest pain -NovoLog (inject 5-15 units into the skin with breakfast, lunch, evening meal) for diabetes -Oxycodone-acetaminophen 7.5-325 mg tablet (once by mouth every 8 hours as needed) for pain -Prednisone 5 mg tablet (once by mouth daily) for your kidney transplant -Promethazine 5 mg tablet (once by mouth every 8 hours as needed) for nausea -Rybelsus 7 mg (once by mouth daily) for your  diabetes -Sulfamethoxazole-trimethoprim 100-80 mg (once by mouth daily) for your kidney transplant -Tacrolimus ER 1 mg (6 mg by mouth daily) for your kidney transplant -Ventolin HFA MCG/ACT inhaler (inhale 1 puff into lungs every 4 hours as needed) for wheezing or shortness of breath -Colecalciferol 50 mcg (take 2000 units by mouth daily) for bone health   Follow-up: -Please follow-up with your primary care provider Karina Presser, FNP as soon as possible when you return to [place].  Signed: Starlyn Skeans, MD 05/17/2022, 1:43 PM   Pager: [phone]

## 2022-05-17 NOTE — TOC Transition Note (Signed)
Transition of Care [facility]) - CM/SW Discharge Note   Patient Details  Name: [name] MRN: [id number] Date of Birth: November 02, 1960  [facility] CM/SW Contact:  Zenon Mayo, RN Phone Number: 05/17/2022, 1:01 PM   Clinical Narrative:    Patient is for dc today, she states her son will be transporting her home to his address today and then by Friday she will be at her address in [place].  NCM informed Tommi Rumps with [facility] of this information, he states they will see patient on the weekend or Monday to give her time to get home to [place].  Patient states that sound great to her.  She is on oxygen and her son will be bringing her tank for her to go home with.  She states she will check with her PCP office about getting PCS services also.     Final next level of care: [place] Barriers to Discharge: No Barriers Identified   Patient Goals and CMS Choice Patient states their goals for this hospitalization and ongoing recovery are:: return home with [facility] CMS Medicare.gov Compare Post Acute Care list provided to:: Patient Choice offered to / list presented to : Patient  Discharge Placement                       Discharge Plan and Services In-house Referral: NA Discharge Planning Services: CM Consult Post Acute Care Choice: Home Health            DME Agency: NA       HH Arranged: PT, Nurse's Aide [place] Agency: [place] Date [facility] Agency Contacted: 05/17/22 Time HH Agency Contacted: 30 Representative spoke with at [place]: [place] ([place]) Interventions     Readmission Risk Interventions    05/12/2022    4:29 PM  Readmission Risk Prevention Plan  Transportation Screening Complete  PCP or Specialist Appt within 3-5 Days Complete  HRI or [place] Complete  Social Work Consult for [place] Planning/Counseling Complete  Palliative Care Screening Not Applicable  Medication  Review Press photographer) Complete

## 2022-08-24 ENCOUNTER — Other Ambulatory Visit: Payer: Self-pay

## 2023-02-27 ENCOUNTER — Inpatient Hospital Stay (HOSPITAL_COMMUNITY)
Admission: EM | Admit: 2023-02-27 | Discharge: 2023-03-03 | DRG: 673 | Disposition: A | Discharge status: Home Health | Payer: 59 | Attending: Internal Medicine | Admitting: Internal Medicine

## 2023-02-27 ENCOUNTER — Encounter (HOSPITAL_COMMUNITY): Payer: Self-pay | Admitting: Emergency Medicine

## 2023-02-27 ENCOUNTER — Emergency Department (HOSPITAL_COMMUNITY): Payer: 59

## 2023-02-27 ENCOUNTER — Emergency Department (HOSPITAL_BASED_OUTPATIENT_CLINIC_OR_DEPARTMENT_OTHER): Payer: 59

## 2023-02-27 ENCOUNTER — Other Ambulatory Visit: Payer: Self-pay

## 2023-02-27 DIAGNOSIS — Z7982 Long term (current) use of aspirin: Secondary | ICD-10-CM

## 2023-02-27 DIAGNOSIS — Y830 Surgical operation with transplant of whole organ as the cause of abnormal reaction of the patient, or of later complication, without mention of misadventure at the time of the procedure: Secondary | ICD-10-CM | POA: Diagnosis present

## 2023-02-27 DIAGNOSIS — Z8249 Family history of ischemic heart disease and other diseases of the circulatory system: Secondary | ICD-10-CM

## 2023-02-27 DIAGNOSIS — Z833 Family history of diabetes mellitus: Secondary | ICD-10-CM

## 2023-02-27 DIAGNOSIS — Z792 Long term (current) use of antibiotics: Secondary | ICD-10-CM

## 2023-02-27 DIAGNOSIS — R7989 Other specified abnormal findings of blood chemistry: Secondary | ICD-10-CM

## 2023-02-27 DIAGNOSIS — I509 Heart failure, unspecified: Secondary | ICD-10-CM

## 2023-02-27 DIAGNOSIS — T8619 Other complication of kidney transplant: Secondary | ICD-10-CM | POA: Diagnosis not present

## 2023-02-27 DIAGNOSIS — M7989 Other specified soft tissue disorders: Secondary | ICD-10-CM

## 2023-02-27 DIAGNOSIS — Z940 Kidney transplant status: Secondary | ICD-10-CM

## 2023-02-27 DIAGNOSIS — Z7952 Long term (current) use of systemic steroids: Secondary | ICD-10-CM

## 2023-02-27 DIAGNOSIS — G8929 Other chronic pain: Secondary | ICD-10-CM | POA: Diagnosis present

## 2023-02-27 DIAGNOSIS — Z951 Presence of aortocoronary bypass graft: Secondary | ICD-10-CM

## 2023-02-27 DIAGNOSIS — R0789 Other chest pain: Secondary | ICD-10-CM | POA: Diagnosis present

## 2023-02-27 DIAGNOSIS — N179 Acute kidney failure, unspecified: Secondary | ICD-10-CM | POA: Diagnosis present

## 2023-02-27 DIAGNOSIS — Z888 Allergy status to other drugs, medicaments and biological substances status: Secondary | ICD-10-CM

## 2023-02-27 DIAGNOSIS — N186 End stage renal disease: Secondary | ICD-10-CM | POA: Diagnosis present

## 2023-02-27 DIAGNOSIS — E876 Hypokalemia: Secondary | ICD-10-CM | POA: Diagnosis not present

## 2023-02-27 DIAGNOSIS — N12 Tubulo-interstitial nephritis, not specified as acute or chronic: Secondary | ICD-10-CM | POA: Diagnosis present

## 2023-02-27 DIAGNOSIS — N390 Urinary tract infection, site not specified: Secondary | ICD-10-CM

## 2023-02-27 DIAGNOSIS — B9620 Unspecified Escherichia coli [E. coli] as the cause of diseases classified elsewhere: Secondary | ICD-10-CM | POA: Diagnosis present

## 2023-02-27 DIAGNOSIS — E119 Type 2 diabetes mellitus without complications: Secondary | ICD-10-CM

## 2023-02-27 DIAGNOSIS — E039 Hypothyroidism, unspecified: Secondary | ICD-10-CM | POA: Diagnosis present

## 2023-02-27 DIAGNOSIS — K3184 Gastroparesis: Secondary | ICD-10-CM | POA: Diagnosis present

## 2023-02-27 DIAGNOSIS — Z1152 Encounter for screening for COVID-19: Secondary | ICD-10-CM

## 2023-02-27 DIAGNOSIS — Z1612 Extended spectrum beta lactamase (ESBL) resistance: Secondary | ICD-10-CM | POA: Diagnosis present

## 2023-02-27 DIAGNOSIS — Z79899 Other long term (current) drug therapy: Secondary | ICD-10-CM

## 2023-02-27 DIAGNOSIS — J9611 Chronic respiratory failure with hypoxia: Secondary | ICD-10-CM | POA: Diagnosis present

## 2023-02-27 DIAGNOSIS — D573 Sickle-cell trait: Secondary | ICD-10-CM | POA: Diagnosis present

## 2023-02-27 DIAGNOSIS — Z7901 Long term (current) use of anticoagulants: Secondary | ICD-10-CM

## 2023-02-27 DIAGNOSIS — I132 Hypertensive heart and chronic kidney disease with heart failure and with stage 5 chronic kidney disease, or end stage renal disease: Secondary | ICD-10-CM | POA: Diagnosis present

## 2023-02-27 DIAGNOSIS — E1143 Type 2 diabetes mellitus with diabetic autonomic (poly)neuropathy: Secondary | ICD-10-CM | POA: Diagnosis present

## 2023-02-27 DIAGNOSIS — I2510 Atherosclerotic heart disease of native coronary artery without angina pectoris: Secondary | ICD-10-CM | POA: Diagnosis present

## 2023-02-27 DIAGNOSIS — Z86718 Personal history of other venous thrombosis and embolism: Secondary | ICD-10-CM

## 2023-02-27 DIAGNOSIS — Z87891 Personal history of nicotine dependence: Secondary | ICD-10-CM

## 2023-02-27 DIAGNOSIS — J8410 Pulmonary fibrosis, unspecified: Secondary | ICD-10-CM | POA: Diagnosis present

## 2023-02-27 DIAGNOSIS — Z96659 Presence of unspecified artificial knee joint: Secondary | ICD-10-CM | POA: Diagnosis present

## 2023-02-27 DIAGNOSIS — B9629 Other Escherichia coli [E. coli] as the cause of diseases classified elsewhere: Secondary | ICD-10-CM | POA: Diagnosis present

## 2023-02-27 DIAGNOSIS — Z4931 Encounter for adequacy testing for hemodialysis: Secondary | ICD-10-CM

## 2023-02-27 DIAGNOSIS — Z8674 Personal history of sudden cardiac arrest: Secondary | ICD-10-CM

## 2023-02-27 DIAGNOSIS — Z8616 Personal history of COVID-19: Secondary | ICD-10-CM

## 2023-02-27 DIAGNOSIS — E1122 Type 2 diabetes mellitus with diabetic chronic kidney disease: Secondary | ICD-10-CM | POA: Diagnosis present

## 2023-02-27 DIAGNOSIS — I5033 Acute on chronic diastolic (congestive) heart failure: Secondary | ICD-10-CM | POA: Diagnosis not present

## 2023-02-27 DIAGNOSIS — R079 Chest pain, unspecified: Secondary | ICD-10-CM | POA: Diagnosis present

## 2023-02-27 DIAGNOSIS — Z794 Long term (current) use of insulin: Secondary | ICD-10-CM

## 2023-02-27 DIAGNOSIS — I10 Essential (primary) hypertension: Secondary | ICD-10-CM | POA: Diagnosis present

## 2023-02-27 DIAGNOSIS — Z9841 Cataract extraction status, right eye: Secondary | ICD-10-CM

## 2023-02-27 DIAGNOSIS — Z79890 Hormone replacement therapy (postmenopausal): Secondary | ICD-10-CM

## 2023-02-27 DIAGNOSIS — K219 Gastro-esophageal reflux disease without esophagitis: Secondary | ICD-10-CM | POA: Diagnosis present

## 2023-02-27 DIAGNOSIS — E1151 Type 2 diabetes mellitus with diabetic peripheral angiopathy without gangrene: Secondary | ICD-10-CM | POA: Diagnosis present

## 2023-02-27 DIAGNOSIS — Z9842 Cataract extraction status, left eye: Secondary | ICD-10-CM

## 2023-02-27 DIAGNOSIS — E785 Hyperlipidemia, unspecified: Secondary | ICD-10-CM | POA: Diagnosis present

## 2023-02-27 LAB — COMPREHENSIVE METABOLIC PANEL
ALT: 13 U/L (ref 0–44)
AST: 18 U/L (ref 15–41)
Albumin: 3.5 g/dL (ref 3.5–5.0)
Alkaline Phosphatase: 82 U/L (ref 38–126)
Anion gap: 15 (ref 5–15)
BUN: 33 mg/dL — ABNORMAL HIGH (ref 8–23)
CO2: 23 mmol/L (ref 22–32)
Calcium: 9.5 mg/dL (ref 8.9–10.3)
Chloride: 97 mmol/L — ABNORMAL LOW (ref 98–111)
Creatinine, Ser: 1.33 mg/dL — ABNORMAL HIGH (ref 0.44–1.00)
GFR, Estimated: 45 mL/min — ABNORMAL LOW (ref >60)
Glucose, Bld: 217 mg/dL — ABNORMAL HIGH (ref 70–99)
Potassium: 3.4 mmol/L — ABNORMAL LOW (ref 3.5–5.1)
Sodium: 135 mmol/L (ref 135–145)
Total Bilirubin: 0.4 mg/dL (ref 0.3–1.2)
Total Protein: 7.6 g/dL (ref 6.5–8.1)

## 2023-02-27 LAB — BRAIN NATRIURETIC PEPTIDE: B Natriuretic Peptide: 195 pg/mL — ABNORMAL HIGH (ref 0.0–100.0)

## 2023-02-27 LAB — CBC WITH DIFFERENTIAL/PLATELET
Abs Immature Granulocytes: 0.08 10*3/uL — ABNORMAL HIGH (ref 0.00–0.07)
Basophils Absolute: 0 10*3/uL (ref 0.0–0.1)
Basophils Relative: 0 %
Eosinophils Absolute: 0 10*3/uL (ref 0.0–0.5)
Eosinophils Relative: 0 %
HCT: 41.7 % (ref 36.0–46.0)
Hemoglobin: 12.7 g/dL (ref 12.0–15.0)
Immature Granulocytes: 2 %
Lymphocytes Relative: 17 %
Lymphs Abs: 0.8 10*3/uL (ref 0.7–4.0)
MCH: 21.1 pg — ABNORMAL LOW (ref 26.0–34.0)
MCHC: 30.5 g/dL (ref 30.0–36.0)
MCV: 69.3 fL — ABNORMAL LOW (ref 80.0–100.0)
Monocytes Absolute: 0.7 10*3/uL (ref 0.1–1.0)
Monocytes Relative: 16 %
Neutro Abs: 2.9 10*3/uL (ref 1.7–7.7)
Neutrophils Relative %: 65 %
Platelets: 193 10*3/uL (ref 150–400)
RBC: 6.02 MIL/uL — ABNORMAL HIGH (ref 3.87–5.11)
RDW: 19.3 % — ABNORMAL HIGH (ref 11.5–15.5)
WBC: 4.5 10*3/uL (ref 4.0–10.5)
nRBC: 0 % (ref 0.0–0.2)

## 2023-02-27 LAB — URINALYSIS, ROUTINE W REFLEX MICROSCOPIC
Bilirubin Urine: NEGATIVE
Glucose, UA: NEGATIVE mg/dL
Hgb urine dipstick: NEGATIVE
Ketones, ur: NEGATIVE mg/dL
Nitrite: NEGATIVE
Protein, ur: NEGATIVE mg/dL
Specific Gravity, Urine: 1.013 (ref 1.005–1.030)
pH: 6 (ref 5.0–8.0)

## 2023-02-27 LAB — TROPONIN I (HIGH SENSITIVITY)
Troponin I (High Sensitivity): 242 ng/L (ref <18)
Troponin I (High Sensitivity): 216 ng/L (ref <18)
Troponin I (High Sensitivity): 220 ng/L (ref <18)

## 2023-02-27 LAB — LIPASE, BLOOD: Lipase: 41 U/L (ref 11–51)

## 2023-02-27 LAB — SARS CORONAVIRUS 2 BY RT PCR: SARS Coronavirus 2 by RT PCR: NEGATIVE

## 2023-02-27 MED ORDER — ONDANSETRON HCL 4 MG/2ML IJ SOLN
4.0000 mg | Freq: Once | INTRAMUSCULAR | Status: AC
  Administered 2023-02-27: 4 mg via INTRAVENOUS
  Filled 2023-02-27: qty 2

## 2023-02-27 MED ORDER — SODIUM CHLORIDE 0.9 % IV BOLUS
500.0000 mL | Freq: Once | INTRAVENOUS | Status: AC
  Administered 2023-02-27: 500 mL via INTRAVENOUS

## 2023-02-27 MED ORDER — MORPHINE SULFATE (PF) 4 MG/ML IV SOLN
4.0000 mg | Freq: Once | INTRAVENOUS | Status: AC
  Administered 2023-02-27: 4 mg via INTRAVENOUS
  Filled 2023-02-27: qty 1

## 2023-02-27 MED ORDER — IOHEXOL 350 MG/ML SOLN
60.0000 mL | Freq: Once | INTRAVENOUS | Status: AC | PRN
  Administered 2023-02-27: 60 mL via INTRAVENOUS

## 2023-02-27 MED ORDER — SODIUM CHLORIDE 0.9 % IV SOLN
1.0000 g | Freq: Once | INTRAVENOUS | Status: AC
  Administered 2023-02-27: 1 g via INTRAVENOUS
  Filled 2023-02-27: qty 10

## 2023-02-27 NOTE — ED Notes (Signed)
Unable to obtain 2nd set of BC 

## 2023-02-27 NOTE — ED Provider Triage Note (Signed)
Emergency Medicine Provider Triage Evaluation Note  [name] , a 62 y.o. female  was evaluated in triage.  Pt complains of weakness, nausea, vomiting. States that she was seen for same on Thursday in [place] where she lives, was diagnosed with a UTI, given 1 dose of IV Rocephin and discharged with cefdinir which she has taken 2 doses of. Hx kidney transplant at [facility]. Here visiting family over the weekend. States that she had frequent UTIs and usually requires admission for IV antibiotics. She is on 2L [place] at baseline, states she is short of breath at baseline and denies any worsening SOB. Does note some generalized abdominal pain.   Review of Systems  Positive:  Negative:   Physical Exam  BP (!) 159/67   Pulse 85   Temp 98.7 F (37.1 C)   Resp 20   Ht 5\' 2"  (1.575 m)   Wt 84.8 kg   SpO2 95%   BMI 34.20 kg/m  Gen:   Awake, no distress   Resp:  Normal effort  MSK:   Moves extremities without difficulty  Other:    Medical Decision Making  Medically screening exam initiated at 3:07 PM.  Appropriate orders placed.  [name] was informed that the remainder of the evaluation will be completed by another provider, this initial triage assessment does not replace that evaluation, and the importance of remaining in the ED until their evaluation is complete.     Silva Bandy, PA-C 02/27/23 1510

## 2023-02-27 NOTE — ED Notes (Signed)
Patient placed on 2 L [place] d/t oxygen saturation dropping post pain medication administration.

## 2023-02-27 NOTE — ED Triage Notes (Signed)
Per [facility] pt coming from familys house was seen at another hospital Thursday for UTI. Received IV antibiotics. Since then has been having weakness, shortness of breath and emesis.

## 2023-02-27 NOTE — ED Notes (Signed)
Patient attempted to provide urine specimen and unsuccessful at this time. Given cup.

## 2023-02-27 NOTE — ED Provider Notes (Signed)
[name] EMERGENCY DEPARTMENT AT [facility] Provider Note   CSN: [id number] Arrival date & time: 02/27/23  1422    History  Chief Complaint  Patient presents with   Weakness    [name] is a 62 y.o. female with complicated medical history occluding chronic hypoxic respiratory failure, s/p renal transplant 5 years ago, followed by [facility], cardiac arrest in December 2023, chronic anticoagulation prior DVT, recurrent UTI, chronic hip pain chronic opiate management here for evaluation of generalized weakness and concern for persistent UTI.  States she was seen by PCP about 3 weeks ago.  Diagnosed with UTI.  Started on antibiotics.  Had persistent UTI after that was subsequently given additional antibiotics.  She had some weakness and some persistent dysuria presented to [facility] emergency department where she was again diagnosed with UTI.  They gave her IV Rocephin and sent her home on antibiotics.  Patient states she continues to feel very weak.  Her dysuria has improved however she has concerns that she still is urinary tract infection, pain to right kidney? States she has chronic shortness of breath and CP since CABG last year. Wears 2 L at rest, 3 L at night due to chronic lung disease from prior COVID infection.  She has noted she has had some chronic swelling to her right lower extremity s/p bimalleolar ankle fracture in December.  She recently came out of her boot.  Area is non-tender without redness. Hx of dvt, compliant with meds. She has had persistent nausea and NBNB emesis.  She has had some pressure in her chest which does not feel typical of her "normal" chest tightness. Some chronic CP since CABG 1 year ago. Pain/ pressure x > 1 week. No cough. Not worsening, not exertional. She has some chronic back pain and chronic bilateral hip pain (avascular necrosis) which she relates to her multiple episodes of emesis and unable to keep down her home narcotic pain  medicine. She is unsure if she has been throwing up her Eliquis.  Chills without documented fever at home.    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 Authorizing Provider  levothyroxine (SYNTHROID) 100 MCG tablet Take 100 mcg by mouth daily before breakfast.   Yes [provider]  linaclotide (LINZESS) 290 MCG CAPS capsule Take 290 mcg by mouth daily before breakfast. 06/07/19  Yes [provider]  mycophenolate (MYFORTIC) 180 MG EC tablet Take 360 mg by mouth 2 (two) times daily. 12/26/18  Yes [provider]  NIFEdipine (PROCARDIA-XL/ADALAT CC) 60 MG 24 hr tablet Take 1 tablet (60 mg total) by mouth daily. Patient taking differently: Take 60 mg by mouth in the morning and at bedtime. 03/31/16  Yes Dorothea Ogle, MD  nitroGLYCERIN (NITROSTAT) 0.4 MG SL tablet Place 0.4 mg under the tongue as needed for chest pain. 01/28/22  Yes [provider]  NOVOLOG FLEXPEN 100 UNIT/ML FlexPen Inject 5-15 Units into  the skin with breakfast, with lunch, and with evening meal. 01/22/22  Yes [provider]  oxyCODONE-acetaminophen (PERCOCET) 7.5-325 MG tablet Take 1 tablet by mouth every 8 (eight) hours as needed for severe pain.   Yes [provider]  predniSONE (DELTASONE) 5 MG tablet Take 5 mg by mouth daily. 05/26/21  Yes [provider]  promethazine (PHENERGAN) 25 MG tablet Take 25 mg by mouth every 8 (eight) hours as needed for nausea or vomiting.    Yes [provider]   Semaglutide 14 MG TABS Take 14 mg by mouth daily. 03/05/22  Yes [provider]  sulfamethoxazole-trimethoprim (BACTRIM) 400-80 MG tablet Take 1 tablet by mouth every Monday, Wednesday, and Friday. 05/26/21  Yes [provider]  tacrolimus ER (ENVARSUS XR) 1 MG TB24 Take 6 mg by mouth daily. 05/26/21  Yes [provider]  torsemide (DEMADEX) 20 MG tablet Take 2 tablets (40 mg total) by mouth daily. Patient taking differently: Take 40 mg by mouth 2 (two) times daily. 05/17/22  Yes Mapp, Tavien, MD  VENTOLIN HFA 108 (90 Base) MCG/ACT inhaler Inhale 1 puff into the lungs every 4 (four) hours as needed for wheezing or shortness of breath. 02/03/22  Yes [provider]  amitriptyline (ELAVIL) 10 MG tablet Take 10 mg by mouth at bedtime as needed for sleep. 04/07/20   [provider]  apixaban (ELIQUIS) 5 MG TABS tablet Take 5 mg by mouth 2 (two) times daily. 07/21/21   [provider]  aspirin 81 MG chewable tablet Chew 81 mg by mouth daily.    [provider]  atorvastatin (LIPITOR) 20 MG tablet Take 20 mg by mouth daily. 12/16/21   [provider]  carvedilol (COREG) 12.5 MG tablet Take 1 tablet (12.5 mg total) by mouth 2 (two) times daily with a meal. Patient taking differently: Take 25 mg by mouth 2 (two) times daily with a meal. 05/17/22   Mapp, Tavien, MD  Cholecalciferol (VITAMIN D) 50 MCG (2000 UT) tablet Take 2,000 Units by mouth daily.    [provider]  cyclobenzaprine (FLEXERIL) 10 MG tablet Take 10 mg by mouth at bedtime as needed for muscle spasms. 03/05/22   [provider]  diclofenac Sodium (VOLTAREN) 1 % GEL Apply 2 g topically every 8 (eight) hours. 05/17/22   Mapp, Gaylyn Cheers, MD  famotidine (PEPCID) 40 MG tablet Take 40 mg by mouth daily as needed for heartburn or indigestion.    [provider]  gabapentin (NEURONTIN) 300 MG capsule Take 300 mg by mouth 3 (three) times daily. 03/01/22   [provider]  Insulin Glargine w/ Trans Port 100 UNIT/ML SOPN Inject 15 Units into the skin at bedtime. 01/17/20   [provider]  lidocaine (LIDODERM) 5 % Place 1 patch onto the skin daily. Remove & Discard patch within 12 hours or as directed by MD Patient not taking: Reported on 02/27/2023 05/17/22   Karoline Caldwell, MD      Allergies    Lisinopril and Hydralazine    Review of Systems   Review of Systems  Constitutional:  Positive for activity change and fatigue.  HENT: Negative.    Respiratory:  Positive for shortness of breath.   Cardiovascular:  Positive for chest pain and leg swelling (right).  Gastrointestinal:  Positive for abdominal pain, nausea and vomiting. Negative for abdominal distention, anal bleeding, blood in stool, constipation, diarrhea and rectal pain.  Genitourinary:  Positive for dysuria. Negative for difficulty urinating and dyspareunia.  Musculoskeletal: Negative.   Skin: Negative.   Neurological:  Positive for weakness. Negative for dizziness, tremors, seizures, syncope, facial asymmetry, speech difficulty, light-headedness, numbness and headaches.  All other systems reviewed and are negative.   Physical Exam Updated Vital Signs BP (!) 160/70   Pulse 80   Temp 98.2 F (36.8 C) (Oral)   Resp 12   Ht 5\' 2"  (1.575 m)   Wt 84.8 kg   SpO2 100%   BMI 34.20 kg/m  Physical Exam Vitals and nursing note reviewed.  Constitutional:      General: She is not in acute distress.    Appearance: She is well-developed. She is ill-appearing (chronically ill appearing). She is not toxic-appearing or diaphoretic.  HENT:     Head: Atraumatic.     Nose: Nose normal.     Mouth/Throat:     Mouth: Mucous membranes are moist.  Eyes:     Pupils: Pupils are equal, round, and reactive to light.  Cardiovascular:     Rate and Rhythm: Normal rate.     Pulses: Normal pulses.          Radial pulses are 2+ on the right side and 2+ on the left side.       Dorsalis pedis  pulses are 2+ on the right side and 2+ on the left side.     Heart sounds: Normal heart sounds.  Pulmonary:     Effort: Pulmonary effort is normal. No respiratory distress.     Breath sounds: Normal breath sounds.  Abdominal:     General: There is no distension.     Palpations: Abdomen is soft.     Tenderness: There is abdominal tenderness.     Comments: Generalized tenderness  Musculoskeletal:        General: Normal range of motion.     Cervical back: Normal range of motion.     Comments: No bony tenderness, compartments soft, mild nonpitting edema right lower extremity, no redness or warmth.  Skin:    General: Skin is warm and dry.     Capillary Refill: Capillary refill takes less than 2 seconds.  Neurological:     General: No focal deficit present.     Mental Status: She is alert and oriented to person, place, and time.     Comments: Equal handgrip, moves all 4 extremities Cranial nerves II through XII grossly intact  Psychiatric:        Mood and Affect: Mood normal.    ED Results / Procedures / Treatments   Labs (all labs ordered are listed, but only abnormal results are displayed) Labs Reviewed  COMPREHENSIVE METABOLIC PANEL - Abnormal; Notable for the following components:      Result Value   Potassium 3.4 (*)    Chloride 97 (*)    Glucose, Bld 217 (*)    BUN 33 (*)    Creatinine, Ser 1.33 (*)    GFR, Estimated 45 (*)    All other components within normal limits  CBC WITH DIFFERENTIAL/PLATELET - Abnormal; Notable for the following components:   RBC 6.02 (*)    MCV 69.3 (*)    MCH 21.1 (*)    RDW 19.3 (*)    Abs Immature Granulocytes 0.08 (*)    All other components within normal limits  URINALYSIS, ROUTINE W REFLEX MICROSCOPIC - Abnormal; Notable for the following components:   Color, Urine STRAW (*)    Leukocytes,Ua TRACE (*)    Bacteria, UA RARE (*)  All other components within normal limits  BRAIN NATRIURETIC PEPTIDE - Abnormal; Notable for the following  components:   B Natriuretic Peptide 195.0 (*)    All other components within normal limits  TROPONIN I (HIGH SENSITIVITY) - Abnormal; Notable for the following components:   Troponin I (High Sensitivity) 216 (*)    All other components within normal limits  TROPONIN I (HIGH SENSITIVITY) - Abnormal; Notable for the following components:   Troponin I (High Sensitivity) 242 (*)    All other components within normal limits  TROPONIN I (HIGH SENSITIVITY) - Abnormal; Notable for the following components:   Troponin I (High Sensitivity) 220 (*)    All other components within normal limits  SARS CORONAVIRUS 2 BY RT PCR  URINE CULTURE  CULTURE, BLOOD (ROUTINE X 2)  CULTURE, BLOOD (ROUTINE X 2)  LIPASE, BLOOD  TROPONIN I (HIGH SENSITIVITY)    EKG EKG Interpretation  Date/Time:  Sunday February 27 2023 18:25:03 EDT Ventricular Rate:  89 PR Interval:  145 QRS Duration: 89 QT Interval:  384 QTC Calculation: 468 R Axis:   76 Text Interpretation: Sinus rhythm unremarkable ecg Confirmed by Gerhard Munch [phone]) on 02/27/2023 6:31:06 PM  Radiology CT Angio Chest PE W and/or Wo Contrast  Result Date: 02/27/2023 CLINICAL DATA:  Epigastric pain, chest pain, abdominal pain, emesis and shortness of breath. History of DVT. EXAM: CT ANGIOGRAPHY CHEST CT ABDOMEN AND PELVIS WITH CONTRAST TECHNIQUE: Multidetector CT imaging of the chest was performed using the standard protocol during bolus administration of intravenous contrast. Multiplanar CT image reconstructions and MIPs were obtained to evaluate the vascular anatomy. Multidetector CT imaging of the abdomen and pelvis was performed using the standard protocol during bolus administration of intravenous contrast. RADIATION DOSE REDUCTION: This exam was performed according to the departmental dose-optimization program which includes automated exposure control, adjustment of the mA and/or kV according to patient size and/or use of iterative reconstruction  technique. CONTRAST:  60mL OMNIPAQUE IOHEXOL 350 MG/ML SOLN COMPARISON:  CTA chest 05/11/2022 and CT renal stone protocol 05/10/2022 FINDINGS: CTA CHEST FINDINGS Cardiovascular: Sternotomy and CABG. Coronary artery and aortic atherosclerotic calcification. Cardiomegaly. No pericardial effusion. Satisfactory opacification of the pulmonary arteries to the segmental level. No pulmonary embolism. Mediastinum/Nodes: Unremarkable trachea and esophagus. No thoracic adenopathy. Lungs/Pleura: Mild diffuse interlobular septal thickening and patchy ground-glass opacities greatest in the lower lungs, improved compared with 05/11/2022. Bibasilar atelectasis/scarring. No pleural effusion or pneumothorax. Musculoskeletal: No acute fracture. Review of the MIP images confirms the above findings. CT ABDOMEN and PELVIS FINDINGS Hepatobiliary: Unremarkable liver, gallbladder, and biliary tree. Pancreas: Unremarkable. Spleen: Unremarkable. Adrenals/Urinary Tract: Normal adrenal glands. Atrophic native kidneys. Right lower quadrant renal allograft no urinary calculi or hydronephrosis. Unremarkable bladder. Areas of cortical geographic hypoattenuation within the renal transplant suspicious for pyelonephritis. Stomach/Bowel: Normal caliber large and small bowel. Colonic diverticulosis without diverticulitis. Normal appendix. Stomach is within normal limits. Vascular/Lymphatic: Aortic atherosclerosis. No enlarged abdominal or pelvic lymph nodes. Reproductive: Hysterectomy. Other: No free intraperitoneal fluid or air. 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 Superior endplate compression deformity of T10 is presumed chronic. No acute fracture. Review of the MIP images confirms the above findings. IMPRESSION: 1. Negative for acute pulmonary embolism. 2. Findings suggestive of congestive heart failure with mild pulmonary edema. 3. Areas of geographic hypoattenuation within the right lower  quadrant renal transplant suspicious for pyelonephritis. 4. 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 Electronically Signed   By:  Minerva Fester M.D.   On: 02/27/2023 21:46   CT ABDOMEN PELVIS W CONTRAST  Result Date: 02/27/2023 CLINICAL DATA:  Epigastric pain, chest pain, abdominal pain, emesis and shortness of breath. History of DVT. EXAM: CT ANGIOGRAPHY CHEST CT ABDOMEN AND PELVIS WITH CONTRAST TECHNIQUE: Multidetector CT imaging of the chest was performed using the standard protocol during bolus administration of intravenous contrast. Multiplanar CT image reconstructions and MIPs were obtained to evaluate the vascular anatomy. Multidetector CT imaging of the abdomen and pelvis was performed using the standard protocol during bolus administration of intravenous contrast. RADIATION DOSE REDUCTION: This exam was performed according to the departmental dose-optimization program which includes automated exposure control, adjustment of the mA and/or kV according to patient size and/or use of iterative reconstruction technique. CONTRAST:  60mL OMNIPAQUE IOHEXOL 350 MG/ML SOLN COMPARISON:  CTA chest 05/11/2022 and CT renal stone protocol 05/10/2022 FINDINGS: CTA CHEST FINDINGS Cardiovascular: Sternotomy and CABG. Coronary artery and aortic atherosclerotic calcification. Cardiomegaly. No pericardial effusion. Satisfactory opacification of the pulmonary arteries to the segmental level. No pulmonary embolism. Mediastinum/Nodes: Unremarkable trachea and esophagus. No thoracic adenopathy. Lungs/Pleura: Mild diffuse interlobular septal thickening and patchy ground-glass opacities greatest in the lower lungs, improved compared with 05/11/2022. Bibasilar atelectasis/scarring. No pleural effusion or pneumothorax. Musculoskeletal: No acute fracture. Review of the MIP images confirms the above findings. CT ABDOMEN and PELVIS FINDINGS Hepatobiliary: Unremarkable liver, gallbladder, and  biliary tree. Pancreas: Unremarkable. Spleen: Unremarkable. Adrenals/Urinary Tract: Normal adrenal glands. Atrophic native kidneys. Right lower quadrant renal allograft no urinary calculi or hydronephrosis. Unremarkable bladder. Areas of cortical geographic hypoattenuation within the renal transplant suspicious for pyelonephritis. Stomach/Bowel: Normal caliber large and small bowel. Colonic diverticulosis without diverticulitis. Normal appendix. Stomach is within normal limits. Vascular/Lymphatic: Aortic atherosclerosis. No enlarged abdominal or pelvic lymph nodes. Reproductive: Hysterectomy. Other: No free intraperitoneal fluid or air. 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 Superior endplate compression deformity of T10 is presumed chronic. No acute fracture. Review of the MIP images confirms the above findings. IMPRESSION: 1. Negative for acute pulmonary embolism. 2. Findings suggestive of congestive heart failure with mild pulmonary edema. 3. Areas of geographic hypoattenuation within the right lower quadrant renal transplant suspicious for pyelonephritis. 4. 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 Electronically Signed   By: Minerva Fester M.D.   On: 02/27/2023 21:46   VAS [place] LOWER EXTREMITY VENOUS (DVT) (ONLY [facility] & [facility])  Result Date: 02/27/2023  Lower Venous DVT Study Patient Name:  CIELLE PAREJA [facility]  Date of Exam:   02/27/2023 Medical Rec #: [id number]        Accession #:    [id number] Date of Birth: 07/20/1961        Patient Gender: F Patient Age:   50 years Exam Location:  [facility] Procedure:      VAS [place] LOWER EXTREMITY VENOUS (DVT) Referring Phys: Lorenda Cahill Jordain Radin --------------------------------------------------------------------------------  Indications: Swelling, and Patient had ankle surgery December 2023. Bedrest until February. Placed in cam walker in February, just released from wearing cam walker  within the last month.  Limitations: Pain with compression. Unable to turn lights off in room. Comparison Study: Prior right LEV done at [facility] 09/10/2022. Negative for                   DVT. Performing Technologist: Sherren Kerns RVS  Examination Guidelines: A complete evaluation includes B-mode imaging, spectral Doppler, color Doppler, and power Doppler as needed  of all accessible portions of each vessel. Bilateral testing is considered an integral part of a complete examination. Limited examinations for reoccurring indications may be performed as noted. The reflux portion of the exam is performed with the patient in reverse Trendelenburg.  +---------+---------------+---------+-----------+----------+-------------------+ RIGHT    CompressibilityPhasicitySpontaneityPropertiesThrombus Aging      +---------+---------------+---------+-----------+----------+-------------------+ CFV      Full           Yes      Yes                                      +---------+---------------+---------+-----------+----------+-------------------+ SFJ      Full                                                             +---------+---------------+---------+-----------+----------+-------------------+ FV Prox  Full           Yes                                               +---------+---------------+---------+-----------+----------+-------------------+ FV Mid   Full                                                             +---------+---------------+---------+-----------+----------+-------------------+ FV DistalFull           Yes      Yes                                      +---------+---------------+---------+-----------+----------+-------------------+ PFV      Full           Yes      Yes                                      +---------+---------------+---------+-----------+----------+-------------------+ POP      Full           Yes      Yes                                       +---------+---------------+---------+-----------+----------+-------------------+ PTV      Full                                                             +---------+---------------+---------+-----------+----------+-------------------+ PERO  Not well visualized +---------+---------------+---------+-----------+----------+-------------------+ Gastroc                 Yes      Yes                                      +---------+---------------+---------+-----------+----------+-------------------+   +----+---------------+---------+-----------+----------+--------------+ LEFTCompressibilityPhasicitySpontaneityPropertiesThrombus Aging +----+---------------+---------+-----------+----------+--------------+ CFV Full           Yes      Yes                                 +----+---------------+---------+-----------+----------+--------------+     Summary: RIGHT: - There is no evidence of deep vein thrombosis in the lower extremity.  - No cystic structure found in the popliteal fossa.  LEFT: - No evidence of common femoral vein obstruction.  *See table(s) above for measurements and observations.    Preliminary    DG Chest Portable 1 View  Result Date: 02/27/2023 CLINICAL DATA:  Shortness of breath. EXAM: PORTABLE CHEST 1 VIEW COMPARISON:  05/10/2022 FINDINGS: The cardio pericardial silhouette is enlarged. The lungs are clear without focal pneumonia, edema, pneumothorax or pleural effusion. Subtle atelectasis or scarring noted at the left base. Vascular stent device noted in the left axillary region. No acute bony abnormality. Telemetry leads overlie the chest. IMPRESSION: No acute cardiopulmonary findings. Subtle atelectasis or scarring is noted at the left base. Electronically Signed   By: Kennith Center M.D.   On: 02/27/2023 17:59    Procedures .Critical Care  Performed by: Linwood Dibbles, PA-C Authorized by: Linwood Dibbles, PA-C    Critical care provider statement:    Critical care time (minutes):  35   Critical care was necessary to treat or prevent imminent or life-threatening deterioration of the following conditions:  Cardiac failure and dehydration   Critical care was time spent personally by me on the following activities:  Development of treatment plan with patient or surrogate, discussions with consultants, evaluation of patient's response to treatment, examination of patient, ordering and review of laboratory studies, ordering and review of radiographic studies, ordering and performing treatments and interventions, pulse oximetry, re-evaluation of patient's condition and review of old charts     Medications Ordered in ED Medications  sodium chloride 0.9 % bolus 500 mL (0 mLs Intravenous Stopped 02/27/23 2222)  ondansetron (ZOFRAN) injection 4 mg (4 mg Intravenous Given 02/27/23 1902)  morphine (PF) 4 MG/ML injection 4 mg (4 mg Intravenous Given 02/27/23 1902)  iohexol (OMNIPAQUE) 350 MG/ML injection 60 mL (60 mLs Intravenous Contrast Given 02/27/23 2123)  sodium chloride 0.9 % bolus 500 mL (500 mLs Intravenous New Bag/Given 02/27/23 2305)  cefTRIAXone (ROCEPHIN) 1 g in sodium chloride 0.9 % 100 mL IVPB (1 g Intravenous New Bag/Given 02/27/23 2308)  morphine (PF) 4 MG/ML injection 4 mg (4 mg Intravenous Given 02/27/23 2306)   ED Course/ Medical Decision Making/ A&P   62 year old here for evaluation multiple complaints.  Has chronic hypoxic respiratory failure due to prior COVID infection, prior renal transplant 5 years ago, recurrent UTI, s/p cardiac arrest 2023 here for evaluation of weakness.  Has been on multiple antibiotics for a UTI.  Was recently seen at outside hospital 3 days ago given IV Rocephin and sent home on p.o. antibiotics.  Her dysuria is improved her she has had increased weakness over the last few days.  She has some chronic chest tightness worse over the last week.  She has had multiple episodes of NBNB  emesis.  She has generalized abdominal pain.  No changes in her stools, blood in stool.  She chronically wears 2 L oxygen via nasal cannula.  No new cough.  States she feels "very poorly."  Plan on labs, imaging and reassess  Labs and imaging personally viewed and interpreted:  CBC without leukocytosis Metabolic panel creatinine 1.33 Troponin 242>>216 COVID-negative Lipase 41 Chest x-ray with chronic scarring Ultrasound right lower extremity negative for DVT BNP 195 UA trace leuks, rare bacteria CTA chest mild CHF CT abdomen pelvis stranding around transplanted kidney suspicious for pyelonephritis  Discussed labs and imaging with patient.  I reviewed her urine culture from outside facility 3 days ago, pansensitive.  Will give Rocephin here.  Will plan on admission for pyelonephritis in transplanted kidney as well as elevated troponins.  Blood cultures added.  Troponin downtrending, patient denies any overt chest pain currently states more difficulty taking a deep breath.  She does have some edema on her chest x-ray however did receive fluids due to contrast dye, and renal placement.  Admit to hospitalist for further evaluation   CONSULT with Dr. Welton Flakes with Cardiology would not treat this as an NSTEMI, thinks this is likely due to AKI, demand ischemia in her pulmonary edema.  He recommends hospitalist ordering an echocardiogram if there are abnormalities can reconsult cardiology in the morning for formal cards eval  CONSULT with Dr. Julian Reil with hospitalist will see for admission.  The patient appears reasonably stabilized for admission considering the current resources, flow, and capabilities available in the ED at this time, and I doubt any other [facility] requiring further screening and/or treatment in the ED prior to admission.                            Medical Decision Making Amount and/or Complexity of Data Reviewed External Data Reviewed: labs, radiology, ECG and notes. Labs: ordered.  Decision-making details documented in ED Course. Radiology: ordered and independent interpretation performed. Decision-making details documented in ED Course. ECG/medicine tests: ordered and independent interpretation performed. Decision-making details documented in ED Course.  Risk OTC drugs. Prescription drug management. Parenteral controlled substances. Decision regarding hospitalization. Diagnosis or treatment significantly limited by social determinants of health.         Final Clinical Impression(s) / ED Diagnoses Final diagnoses:  Pyelonephritis  Elevated troponin  Chest pressure  Chronic hypoxic respiratory failure (HCC)  Acute on chronic congestive heart failure, unspecified heart failure type (HCC)  AKI (acute kidney injury) (HCC)  Hx of CABG    Rx / DC Orders ED Discharge Orders     None         Elisavet Buehrer A, PA-C 02/28/23 0003    Gerhard Munch, MD 03/01/23 0005

## 2023-02-27 NOTE — ED Notes (Signed)
Patient's daughter updated over the phone with verbal permission from patient.

## 2023-02-27 NOTE — Progress Notes (Signed)
VASCULAR LAB    Right lower extremity venous duplex has been performed.  See CV proc for preliminary results.  Gave report to [employer], PA-C  Raymie Trani, RVT 02/27/2023, 7:11 PM

## 2023-02-28 ENCOUNTER — Observation Stay (HOSPITAL_COMMUNITY): Payer: 59

## 2023-02-28 DIAGNOSIS — E119 Type 2 diabetes mellitus without complications: Secondary | ICD-10-CM

## 2023-02-28 DIAGNOSIS — J8410 Pulmonary fibrosis, unspecified: Secondary | ICD-10-CM | POA: Diagnosis present

## 2023-02-28 DIAGNOSIS — R7989 Other specified abnormal findings of blood chemistry: Secondary | ICD-10-CM

## 2023-02-28 DIAGNOSIS — I10 Essential (primary) hypertension: Secondary | ICD-10-CM | POA: Diagnosis not present

## 2023-02-28 DIAGNOSIS — R079 Chest pain, unspecified: Secondary | ICD-10-CM | POA: Diagnosis not present

## 2023-02-28 DIAGNOSIS — Z951 Presence of aortocoronary bypass graft: Secondary | ICD-10-CM

## 2023-02-28 DIAGNOSIS — E785 Hyperlipidemia, unspecified: Secondary | ICD-10-CM

## 2023-02-28 DIAGNOSIS — Z8674 Personal history of sudden cardiac arrest: Secondary | ICD-10-CM

## 2023-02-28 DIAGNOSIS — Z940 Kidney transplant status: Secondary | ICD-10-CM

## 2023-02-28 DIAGNOSIS — I2510 Atherosclerotic heart disease of native coronary artery without angina pectoris: Secondary | ICD-10-CM

## 2023-02-28 DIAGNOSIS — Z794 Long term (current) use of insulin: Secondary | ICD-10-CM

## 2023-02-28 LAB — GLUCOSE, CAPILLARY
Glucose-Capillary: 185 mg/dL — ABNORMAL HIGH (ref 70–99)
Glucose-Capillary: 173 mg/dL — ABNORMAL HIGH (ref 70–99)

## 2023-02-28 LAB — CULTURE, BLOOD (ROUTINE X 2): Culture: NO GROWTH

## 2023-02-28 LAB — ECHOCARDIOGRAM COMPLETE
Area-P 1/2: 3.61 cm2
Height: 62 in
S' Lateral: 2.8 cm
Weight: 2992 oz

## 2023-02-28 LAB — CBG MONITORING, ED
Glucose-Capillary: 112 mg/dL — ABNORMAL HIGH (ref 70–99)
Glucose-Capillary: 108 mg/dL — ABNORMAL HIGH (ref 70–99)
Glucose-Capillary: 134 mg/dL — ABNORMAL HIGH (ref 70–99)

## 2023-02-28 LAB — APTT: aPTT: 51 seconds — ABNORMAL HIGH (ref 24–36)

## 2023-02-28 LAB — CBC
HCT: 36.3 % (ref 36.0–46.0)
Hemoglobin: 11.1 g/dL — ABNORMAL LOW (ref 12.0–15.0)
MCH: 20.9 pg — ABNORMAL LOW (ref 26.0–34.0)
MCHC: 30.6 g/dL (ref 30.0–36.0)
MCV: 68.5 fL — ABNORMAL LOW (ref 80.0–100.0)
Platelets: 180 10*3/uL (ref 150–400)
RBC: 5.3 MIL/uL — ABNORMAL HIGH (ref 3.87–5.11)
RDW: 18.6 % — ABNORMAL HIGH (ref 11.5–15.5)
WBC: 4 10*3/uL (ref 4.0–10.5)
nRBC: 0 % (ref 0.0–0.2)

## 2023-02-28 LAB — HEMOGLOBIN A1C
Hgb A1c MFr Bld: 7.7 % — ABNORMAL HIGH (ref 4.8–5.6)
Mean Plasma Glucose: 174.29 mg/dL

## 2023-02-28 LAB — TROPONIN I (HIGH SENSITIVITY)
Troponin I (High Sensitivity): 228 ng/L (ref <18)
Troponin I (High Sensitivity): 121 ng/L (ref <18)
Troponin I (High Sensitivity): 116 ng/L (ref <18)

## 2023-02-28 LAB — HEPARIN LEVEL (UNFRACTIONATED): Heparin Unfractionated: >1.1 IU/mL — ABNORMAL HIGH (ref 0.30–0.70)

## 2023-02-28 LAB — C-REACTIVE PROTEIN: CRP: 6.2 mg/dL — ABNORMAL HIGH (ref <1.0)

## 2023-02-28 LAB — SEDIMENTATION RATE: Sed Rate: 17 mm/hr (ref 0–22)

## 2023-02-28 LAB — URINE CULTURE

## 2023-02-28 MED ORDER — GABAPENTIN 300 MG PO CAPS
300.0000 mg | ORAL_CAPSULE | Freq: Three times a day (TID) | ORAL | Status: DC
  Administered 2023-02-28 – 2023-03-03 (×11): 300 mg via ORAL
  Filled 2023-02-28 (×11): qty 1

## 2023-02-28 MED ORDER — DIPHENHYDRAMINE HCL 25 MG PO CAPS
25.0000 mg | ORAL_CAPSULE | Freq: Three times a day (TID) | ORAL | Status: AC | PRN
  Administered 2023-02-28 – 2023-03-01 (×3): 25 mg via ORAL
  Filled 2023-02-28 (×3): qty 1

## 2023-02-28 MED ORDER — SODIUM CHLORIDE 0.9 % IV SOLN: 250.0000 mL | INTRAVENOUS | Status: DC | PRN

## 2023-02-28 MED ORDER — INSULIN ASPART 100 UNIT/ML IJ SOLN
0.0000 [IU] | INTRAMUSCULAR | Status: DC
  Administered 2023-02-28: 1 [IU] via SUBCUTANEOUS

## 2023-02-28 MED ORDER — FAMOTIDINE 20 MG PO TABS: 40.0000 mg | ORAL_TABLET | Freq: Every day | ORAL | Status: DC | PRN

## 2023-02-28 MED ORDER — LEVOTHYROXINE SODIUM 100 MCG PO TABS
100.0000 ug | ORAL_TABLET | Freq: Every day | ORAL | Status: DC
  Administered 2023-03-01 – 2023-03-03 (×3): 100 ug via ORAL
  Filled 2023-02-28 (×3): qty 1

## 2023-02-28 MED ORDER — APIXABAN 5 MG PO TABS
5.0000 mg | ORAL_TABLET | Freq: Two times a day (BID) | ORAL | Status: DC
  Administered 2023-02-28 – 2023-03-03 (×7): 5 mg via ORAL
  Filled 2023-02-28 (×7): qty 1

## 2023-02-28 MED ORDER — ATORVASTATIN CALCIUM 10 MG PO TABS
20.0000 mg | ORAL_TABLET | Freq: Every day | ORAL | Status: DC
  Administered 2023-02-28 – 2023-03-03 (×4): 20 mg via ORAL
  Filled 2023-02-28 (×4): qty 2

## 2023-02-28 MED ORDER — VITAMIN D 25 MCG (1000 UNIT) PO TABS
2000.0000 [IU] | ORAL_TABLET | Freq: Every day | ORAL | Status: DC
  Administered 2023-02-28 – 2023-03-03 (×4): 2000 [IU] via ORAL
  Filled 2023-02-28 (×4): qty 2

## 2023-02-28 MED ORDER — LINACLOTIDE 145 MCG PO CAPS
290.0000 ug | ORAL_CAPSULE | Freq: Every day | ORAL | Status: DC
  Administered 2023-02-28 – 2023-03-03 (×4): 290 ug via ORAL
  Filled 2023-02-28 (×5): qty 2

## 2023-02-28 MED ORDER — OXYCODONE-ACETAMINOPHEN 7.5-325 MG PO TABS
1.0000 | ORAL_TABLET | Freq: Four times a day (QID) | ORAL | Status: DC | PRN
  Administered 2023-02-28 – 2023-03-03 (×9): 1 via ORAL
  Filled 2023-02-28 (×9): qty 1

## 2023-02-28 MED ORDER — INSULIN GLARGINE-YFGN 100 UNIT/ML ~~LOC~~ SOLN
10.0000 [IU] | Freq: Every day | SUBCUTANEOUS | Status: DC
  Administered 2023-02-28 – 2023-03-01 (×2): 10 [IU] via SUBCUTANEOUS
  Filled 2023-02-28 (×4): qty 0.1

## 2023-02-28 MED ORDER — SODIUM CHLORIDE 0.9% FLUSH
3.0000 mL | Freq: Two times a day (BID) | INTRAVENOUS | Status: DC
  Administered 2023-02-28 – 2023-03-03 (×7): 3 mL via INTRAVENOUS

## 2023-02-28 MED ORDER — PREDNISONE 5 MG PO TABS
5.0000 mg | ORAL_TABLET | Freq: Every day | ORAL | Status: DC
  Administered 2023-02-28 – 2023-03-03 (×4): 5 mg via ORAL
  Filled 2023-02-28 (×4): qty 1

## 2023-02-28 MED ORDER — NIFEDIPINE ER OSMOTIC RELEASE 60 MG PO TB24
60.0000 mg | ORAL_TABLET | Freq: Two times a day (BID) | ORAL | Status: DC
  Administered 2023-02-28 – 2023-03-03 (×8): 60 mg via ORAL
  Filled 2023-02-28 (×9): qty 1

## 2023-02-28 MED ORDER — MYCOPHENOLATE SODIUM 180 MG PO TBEC
360.0000 mg | DELAYED_RELEASE_TABLET | Freq: Two times a day (BID) | ORAL | Status: DC
  Administered 2023-02-28 – 2023-03-03 (×8): 360 mg via ORAL
  Filled 2023-02-28 (×9): qty 2

## 2023-02-28 MED ORDER — CYCLOBENZAPRINE HCL 10 MG PO TABS
10.0000 mg | ORAL_TABLET | Freq: Two times a day (BID) | ORAL | Status: DC | PRN
  Administered 2023-02-28 – 2023-03-03 (×4): 10 mg via ORAL
  Filled 2023-02-28 (×4): qty 1

## 2023-02-28 MED ORDER — TACROLIMUS ER 4 MG PO TB24
6.0000 mg | ORAL_TABLET | Freq: Every day | ORAL | Status: DC
  Administered 2023-02-28 – 2023-03-03 (×4): 6 mg via ORAL
  Filled 2023-02-28 (×5): qty 2

## 2023-02-28 MED ORDER — HEPARIN (PORCINE) 25000 UT/250ML-% IV SOLN
1250.0000 [IU]/h | INTRAVENOUS | Status: DC
  Administered 2023-02-28: 1100 [IU]/h via INTRAVENOUS
  Filled 2023-02-28: qty 250

## 2023-02-28 MED ORDER — SULFAMETHOXAZOLE-TRIMETHOPRIM 400-80 MG PO TABS
1.0000 | ORAL_TABLET | ORAL | Status: DC
  Administered 2023-02-28 – 2023-03-02 (×2): 1 via ORAL
  Filled 2023-02-28 (×3): qty 1

## 2023-02-28 MED ORDER — APIXABAN 5 MG PO TABS: 5.0000 mg | ORAL_TABLET | Freq: Two times a day (BID) | ORAL | Status: DC

## 2023-02-28 MED ORDER — ONDANSETRON HCL 4 MG/2ML IJ SOLN: 4.0000 mg | Freq: Four times a day (QID) | INTRAMUSCULAR | Status: DC | PRN

## 2023-02-28 MED ORDER — CEFDINIR 300 MG PO CAPS
300.0000 mg | ORAL_CAPSULE | Freq: Two times a day (BID) | ORAL | Status: DC
  Administered 2023-02-28 – 2023-03-01 (×3): 300 mg via ORAL
  Filled 2023-02-28 (×4): qty 1

## 2023-02-28 MED ORDER — CARVEDILOL 25 MG PO TABS
25.0000 mg | ORAL_TABLET | Freq: Two times a day (BID) | ORAL | Status: DC
  Administered 2023-02-28 – 2023-03-03 (×8): 25 mg via ORAL
  Filled 2023-02-28 (×5): qty 1
  Filled 2023-02-28: qty 2
  Filled 2023-02-28 (×2): qty 1

## 2023-02-28 MED ORDER — INSULIN ASPART 100 UNIT/ML IJ SOLN
0.0000 [IU] | Freq: Three times a day (TID) | INTRAMUSCULAR | Status: DC
  Administered 2023-02-28 (×2): 2 [IU] via SUBCUTANEOUS
  Administered 2023-03-01: 1 [IU] via SUBCUTANEOUS
  Administered 2023-03-01: 5 [IU] via SUBCUTANEOUS
  Administered 2023-03-01: 3 [IU] via SUBCUTANEOUS
  Administered 2023-03-01 – 2023-03-02 (×2): 2 [IU] via SUBCUTANEOUS
  Administered 2023-03-02: 5 [IU] via SUBCUTANEOUS
  Administered 2023-03-02: 2 [IU] via SUBCUTANEOUS
  Administered 2023-03-02: 3 [IU] via SUBCUTANEOUS
  Administered 2023-03-03 (×2): 2 [IU] via SUBCUTANEOUS

## 2023-02-28 MED ORDER — ALBUTEROL SULFATE (2.5 MG/3ML) 0.083% IN NEBU: 2.5000 mg | INHALATION_SOLUTION | RESPIRATORY_TRACT | Status: DC | PRN

## 2023-02-28 MED ORDER — OXYCODONE-ACETAMINOPHEN 7.5-325 MG PO TABS
1.0000 | ORAL_TABLET | Freq: Three times a day (TID) | ORAL | Status: DC | PRN
  Administered 2023-02-28 (×2): 1 via ORAL
  Filled 2023-02-28 (×2): qty 1

## 2023-02-28 MED ORDER — ASPIRIN 81 MG PO CHEW
81.0000 mg | CHEWABLE_TABLET | Freq: Every day | ORAL | Status: DC
  Administered 2023-02-28 – 2023-03-03 (×4): 81 mg via ORAL
  Filled 2023-02-28 (×4): qty 1

## 2023-02-28 MED ORDER — SODIUM CHLORIDE 0.9% FLUSH: 3.0000 mL | INTRAVENOUS | Status: DC | PRN

## 2023-02-28 MED ORDER — ACETAMINOPHEN 325 MG PO TABS
650.0000 mg | ORAL_TABLET | ORAL | Status: DC | PRN
  Administered 2023-02-28: 650 mg via ORAL
  Filled 2023-02-28: qty 2

## 2023-02-28 NOTE — ED Notes (Signed)
Patient's brief changed, peri care provided and clean brief applied.

## 2023-02-28 NOTE — Care Management Obs Status (Signed)
MEDICARE OBSERVATION STATUS NOTIFICATION   Patient Details  Name: [name] MRN: [id number] Date of Birth: [birth year]   Medicare Observation Status Notification Given:  Yes    Leone Haven, RN 02/28/2023, 4:35 PM

## 2023-02-28 NOTE — ED Notes (Signed)
ED TO INPATIENT HANDOFF REPORT  ED Nurse Name and Phone #: Brett Canales [phone]  S Name/Age/Gender [name] 62 y.o. female Room/Bed: 045C/045C  Code Status   Code Status: Full Code  Home/SNF/Other Home Patient oriented to: self, place, time, and situation Is this baseline? Yes   Triage Complete: Triage complete  Chief Complaint Chest pain, rule out acute myocardial infarction [R07.9]  Triage Note Per [facility] pt coming from familys house was seen at another hospital Thursday for UTI. Received IV antibiotics. Since then has been having weakness, shortness of breath and emesis.    Allergies Allergies  Allergen Reactions   Lisinopril Swelling    Swelling of tongue   Hydralazine     Other reaction(s): heart palpitations    Level of Care/Admitting Diagnosis ED Disposition     ED Disposition  Admit   Condition  --   Comment  Hospital [facility] [[id number]]  Level of Care: Progressive [102]  Admit to Progressive based on following criteria: CARDIOVASCULAR & THORACIC of moderate stability with acute coronary syndrome symptoms/low risk myocardial infarction/hypertensive urgency/arrhythmias/heart failure potentially compromising stability and stable post cardiovascular intervention patients.  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 On multiple medications.   Gastroparesis due to DM [facility])    GERD (gastroesophageal reflux disease)    Heart murmur    Hemodialysis patient [facility])    Hyperlipidemia    Hypothyroidism (acquired)    Paroxysmal supraventricular tachycardia    Peripheral arterial disease [facility]) December 2014   @ [facility]- Billie Lade: a) RSFA PTA (01/2013); b) L SFA [place] Atherectomy/PTA with 3V runoff; LEA Dopplers January 2016 [facility] post L SFA PTA): Mild, insignificant disease in the left CFA, profunda, SFA, popliteal artery and tibioperoneal trunk.   Sickle cell trait [facility])    Past Surgical History:  Procedure Laterality Date   ABDOMINAL HYSTERECTOMY     ARTERIOVENOUS GRAFT PLACEMENT Left 2012   Dr. Tenny Craw  2nd graft   Carotid ultrasound   May 2011   Normal [facility] [place], [place])   CATARACT EXTRACTION, BILATERAL     COLONOSCOPY  07/19/2012   Procedure: COLONOSCOPY;  Surgeon: Theda Belfast, MD;  Location: [facility] ENDOSCOPY;  Service: Endoscopy;  Laterality: N/A;   FISTULOGRAM Left 06/04/2015   Procedure: FISTULOGRAM;  Surgeon: Fransisco Hertz, MD;  Location[facility] OR;  Service: Vascular;  Laterality: Left;   KNEE ARTHROPLASTY     KNEE SURGERY     right   [facility]  September 2009; 01/15/2015   a) @ [facility] - Negative for inducible or reversible ischemia with pharmacologic stress. EF 63% ; b) [facility]: EF 68%. No RWMA, negative S4 reversible coronary ischemia or infarction.  PERIPHERAL ANGIOPLASTY  May 2014   PTA of R SFA-PopA.   PERIPHERAL ATHRECTOMY  December 2014   [place] Atherectomy of L SFA-L PopA reduction of 60% to 10% stenosis    PERIPHERAL VASCULAR CATHETERIZATION  May 2014; December 2014   a) RSFA & PopA mod-severe disease -- R SFA PTA, Med Rx of L SFA; b) distal abdominal and iliac & CFA arteries patent.  m-d LSFA long 60%, LPop 60% -> 3 V  runoff. RSFA ~40% mid, RPop 60%, 3V runoff   REVISION OF ARTERIOVENOUS GORETEX GRAFT Left 06/04/2015   Procedure: REVISION OF ARTERIOVENOUS GORETEX GRAFT;  Surgeon: Fransisco Hertz, MD;  Location: [facility] OR;  Service: Vascular;  Laterality: Left;   SHUNTOGRAM Left 02/14/2013   Procedure: FISTULOGRAM;  Surgeon: Nada Libman, MD;  Location: [facility] CATH LAB;  Service: Cardiovascular;  Laterality: Left;   THYROID SURGERY     THYROIDECTOMY     TRANSTHORACIC ECHOCARDIOGRAM  October 2013; January 2015   a) [facility]) 2013: normal LV size and function. Mild LVH. EF 55-60%. no RWMA, No valve disease; b) [facility] 1/'15: EF 70-75%, mild MR. Normal diastolic dysfunction     A IV Location/Drains/Wounds Patient Lines/Drains/Airways Status     Active Line/Drains/Airways     Name Placement date Placement time Site Days   Peripheral IV 02/27/23 20 G Posterior;Proximal;Right Forearm 02/27/23  1901  Forearm  1   Fistula / Graft Left Arteriovenous vein graft 06/04/13  --  --  3556   External Urinary Catheter 02/28/23  1219  --  less than 1            Intake/Output Last 24 hours  Intake/Output Summary (Last 24 hours) at 02/28/2023 1417 Last data filed at 02/28/2023 0133 Gross per 24 hour  Intake 1100 ml  Output --  Net 1100 ml    Labs/Imaging Results for orders placed or performed during the hospital encounter of 02/27/23 (from the past 48 hour(s))  Comprehensive metabolic panel     Status: Abnormal   Collection Time: 02/27/23  3:27 PM  Result Value Ref Range   Sodium 135 135 - 145 mmol/L   Potassium 3.4 (L) 3.5 - 5.1 mmol/L   Chloride 97 (L) 98 - 111 mmol/L   CO2 23 22 - 32 mmol/L   Glucose, Bld 217 (H) 70 - 99 mg/dL    Comment: Glucose reference range applies only to samples taken after fasting for at least 8 hours.   BUN 33 (H) 8 - 23 mg/dL   Creatinine, Ser 6.29 (H) 0.44 - 1.00 mg/dL   Calcium 9.5 8.9 - 52.8 mg/dL   Total Protein 7.6 6.5 - 8.1 g/dL   Albumin 3.5 3.5 - 5.0 g/dL   AST  18 15 - 41 U/L   ALT 13 0 - 44 U/L   Alkaline Phosphatase 82 38 - 126 U/L   Total Bilirubin 0.4 0.3 - 1.2 mg/dL   GFR, Estimated 45 (L) >60 mL/min    Comment: (NOTE) Calculated using the CKD-EPI Creatinine Equation (2021)    Anion gap 15 5 - 15    Comment: Performed at [facility], [address], [place], [place] [address]  Lipase, blood     Status: None   Collection Time: 02/27/23  3:27 PM  Result Value Ref Range   Lipase 41 11 - 51 U/L    Comment: Performed at [facility], [address] [place], [place] [address]  CBC with Diff  Status: Abnormal   Collection Time: 02/27/23  3:27 PM  Result Value Ref Range   WBC 4.5 4.0 - 10.5 K/uL   RBC 6.02 (H) 3.87 - 5.11 MIL/uL   Hemoglobin 12.7 12.0 - 15.0 g/dL   HCT 16.1 09.6 - 04.5 %   MCV 69.3 (L) 80.0 - 100.0 fL   MCH 21.1 (L) 26.0 - 34.0 pg   MCHC 30.5 30.0 - 36.0 g/dL   RDW 40.9 (H) 81.1 - 91.4 %   Platelets 193 150 - 400 K/uL    Comment: REPEATED TO VERIFY   nRBC 0.0 0.0 - 0.2 %   Neutrophils Relative % 65 %   Neutro Abs 2.9 1.7 - 7.7 K/uL   Lymphocytes Relative 17 %   Lymphs Abs 0.8 0.7 - 4.0 K/uL   Monocytes Relative 16 %   Monocytes Absolute 0.7 0.1 - 1.0 K/uL   Eosinophils Relative 0 %   Eosinophils Absolute 0.0 0.0 - 0.5 K/uL   Basophils Relative 0 %   Basophils Absolute 0.0 0.0 - 0.1 K/uL   Smear Review Reviewed    Immature Granulocytes 2 %   Abs Immature Granulocytes 0.08 (H) 0.00 - 0.07 K/uL    Comment: Performed at [facility], [address], [place], [place] [address]  Brain natriuretic peptide     Status: Abnormal   Collection Time: 02/27/23  3:27 PM  Result Value Ref Range   B Natriuretic Peptide 195.0 (H) 0.0 - 100.0 pg/mL    Comment: Performed at [facility], [address] [place], [place] [address]  Troponin I (High Sensitivity)     Status: Abnormal   Collection Time: 02/27/23  7:00 PM  Result Value Ref Range   Troponin I (High Sensitivity) 242 (HH) <18 ng/L     Comment: CRITICAL RESULT CALLED TO, READ BACK BY AND VERIFIED WITH R,SMITH RN @1949  02/27/23 E,BENTON (NOTE) Elevated high sensitivity troponin I (hsTnI) values and significant  changes across serial measurements may suggest ACS but many other  chronic and acute conditions are known to elevate hsTnI results.  Refer to the "Links" section for chest pain algorithms and additional  guidance. Performed at [facility], [address], [place], [place] [address]   SARS Coronavirus 2 by RT PCR (hospital order, performed in [facility] hospital lab) *cepheid single result test* Anterior Nasal Swab     Status: None   Collection Time: 02/27/23  7:16 PM   Specimen: Anterior Nasal Swab  Result Value Ref Range   SARS Coronavirus 2 by RT PCR NEGATIVE NEGATIVE    Comment: Performed at [facility], [address] [place], [place] [address]  Urinalysis, Routine w reflex microscopic -Urine, Clean Catch     Status: Abnormal   Collection Time: 02/27/23  9:50 PM  Result Value Ref Range   Color, Urine STRAW (A) YELLOW   APPearance CLEAR CLEAR   Specific Gravity, Urine 1.013 1.005 - 1.030   pH 6.0 5.0 - 8.0   Glucose, UA NEGATIVE NEGATIVE mg/dL   Hgb urine dipstick NEGATIVE NEGATIVE   Bilirubin Urine NEGATIVE NEGATIVE   Ketones, ur NEGATIVE NEGATIVE mg/dL   Protein, ur NEGATIVE NEGATIVE mg/dL   Nitrite NEGATIVE NEGATIVE   Leukocytes,Ua TRACE (A) NEGATIVE   RBC / HPF 0-5 0 - 5 RBC/hpf   WBC, UA 0-5 0 - 5 WBC/hpf   Bacteria, UA RARE (A) NONE SEEN   Squamous Epithelial / HPF 0-5 0 - 5 /HPF  Comment: Performed at [facility] Lab, [address] [place], [place] [address]  Troponin I (High Sensitivity)     Status: Abnormal   Collection Time: 02/27/23  9:50 PM  Result Value Ref Range   Troponin I (High Sensitivity) 216 (HH) <18 ng/L    Comment: DELTA CHECK NOTED CRITICAL VALUE NOTED. VALUE IS CONSISTENT WITH PREVIOUSLY REPORTED/CALLED VALUE (NOTE) Elevated high sensitivity troponin I  (hsTnI) values and significant  changes across serial measurements may suggest ACS but many other  chronic and acute conditions are known to elevate hsTnI results.  Refer to the "Links" section for chest pain algorithms and additional  guidance. Performed at [facility], [address], [place], [place] [address]   Blood culture (routine x 2)     Status: None (Preliminary result)   Collection Time: 02/27/23 11:00 PM   Specimen: BLOOD  Result Value Ref Range   Specimen Description BLOOD SITE NOT SPECIFIED    Special Requests      BOTTLES DRAWN AEROBIC AND ANAEROBIC Blood Culture adequate volume   Culture      NO GROWTH < 12 HOURS Performed at [facility], [address] [place], [place] [address]    Report Status PENDING   Troponin I (High Sensitivity)     Status: Abnormal   Collection Time: 02/27/23 11:00 PM  Result Value Ref Range   Troponin I (High Sensitivity) 220 (HH) <18 ng/L    Comment: CRITICAL VALUE NOTED. VALUE IS CONSISTENT WITH PREVIOUSLY REPORTED/CALLED VALUE (NOTE) Elevated high sensitivity troponin I (hsTnI) values and significant  changes across serial measurements may suggest ACS but many other  chronic and acute conditions are known to elevate hsTnI results.  Refer to the "Links" section for chest pain algorithms and additional  guidance. Performed at [facility], [address] [place], [place] [address]   Troponin I (High Sensitivity)     Status: Abnormal   Collection Time: 02/28/23  1:15 AM  Result Value Ref Range   Troponin I (High Sensitivity) 228 (HH) <18 ng/L    Comment: CRITICAL VALUE NOTED. VALUE IS CONSISTENT WITH PREVIOUSLY REPORTED/CALLED VALUE (NOTE) Elevated high sensitivity troponin I (hsTnI) values and significant  changes across serial measurements may suggest ACS but many other  chronic and acute conditions are known to elevate hsTnI results.  Refer to the "Links" section for chest pain algorithms and additional   guidance. Performed at [facility], [address], [place], [place] [address]   Hemoglobin A1c     Status: Abnormal   Collection Time: 02/28/23  1:15 AM  Result Value Ref Range   Hgb A1c MFr Bld 7.7 (H) 4.8 - 5.6 %    Comment: (NOTE) Pre diabetes:          5.7%-6.4%  Diabetes:              >6.4%  Glycemic control for   <7.0% adults with diabetes    Mean Plasma Glucose 174.29 mg/dL    Comment: Performed at [facility], [address], [place], [place] [address]  CBC     Status: Abnormal   Collection Time: 02/28/23  1:15 AM  Result Value Ref Range   WBC 4.0 4.0 - 10.5 K/uL   RBC 5.30 (H) 3.87 - 5.11 MIL/uL   Hemoglobin 11.1 (L) 12.0 - 15.0 g/dL   HCT 57.8 46.9 - 62.9 %   MCV 68.5 (L) 80.0 - 100.0 fL  MCH 20.9 (L) 26.0 - 34.0 pg   MCHC 30.6 30.0 - 36.0 g/dL   RDW 40.9 (H) 81.1 - 91.4 %   Platelets 180 150 - 400 K/uL    Comment: REPEATED TO VERIFY   nRBC 0.0 0.0 - 0.2 %    Comment: Performed at [facility] Lab, [address], [place], [place] [address]  CBG monitoring, ED     Status: Abnormal   Collection Time: 02/28/23  1:57 AM  Result Value Ref Range   Glucose-Capillary 112 (H) 70 - 99 mg/dL    Comment: Glucose reference range applies only to samples taken after fasting for at least 8 hours.  CBG monitoring, ED     Status: Abnormal   Collection Time: 02/28/23  4:06 AM  Result Value Ref Range   Glucose-Capillary 108 (H) 70 - 99 mg/dL    Comment: Glucose reference range applies only to samples taken after fasting for at least 8 hours.  Heparin level (unfractionated)     Status: Abnormal   Collection Time: 02/28/23  9:00 AM  Result Value Ref Range   Heparin Unfractionated >1.10 (H) 0.30 - 0.70 IU/mL    Comment: (NOTE) The clinical reportable range upper limit is being lowered to >1.10 to align with the FDA approved guidance for the current laboratory assay.  If heparin results are below expected values, and patient dosage has  been confirmed,  suggest follow up testing of antithrombin III levels. Performed at [facility], [address], [place], [place] [address]   APTT     Status: Abnormal   Collection Time: 02/28/23 10:08 AM  Result Value Ref Range   aPTT 51 (H) 24 - 36 seconds    Comment:        IF BASELINE aPTT IS ELEVATED, SUGGEST PATIENT RISK ASSESSMENT BE USED TO DETERMINE APPROPRIATE ANTICOAGULANT THERAPY. Performed at [facility] Lab, [address] [place], [place] [address]   CBG monitoring, ED     Status: Abnormal   Collection Time: 02/28/23 10:08 AM  Result Value Ref Range   Glucose-Capillary 134 (H) 70 - 99 mg/dL    Comment: Glucose reference range applies only to samples taken after fasting for at least 8 hours.   ECHOCARDIOGRAM COMPLETE  Result Date: 02/28/2023    ECHOCARDIOGRAM REPORT   Patient Name:   IANTHA SCHMEER [facility] Date of Exam: 02/28/2023 Medical Rec #:  [id number]       Height:       62.0 in Accession #:    [id number]      Weight:       187.0 lb Date of Birth:  1961-02-26       BSA:          1.858 m Patient Age:    62 years        BP:           152/72 mmHg Patient Gender: F               HR:           67 bpm. Exam Location:  Inpatient Procedure: 2D Echo, Cardiac Doppler and Color Doppler Indications:    r/o acute infarction  History:        Patient has prior history of Echocardiogram examinations, most                 recent 04/26/2022. Prior CABG, PAD, Signs/Symptoms:Shortness of  Breath; Risk Factors:Hypertension, Diabetes and Dyslipidemia.  Sonographer:    Melissa Morford RDCS (AE, PE) Referring Phys: [id number] JARED M GARDNER IMPRESSIONS  1. Left ventricular ejection fraction, by estimation, is 60 to 65%. The left ventricle has normal function. The left ventricle has no regional wall motion abnormalities. There is mild concentric left ventricular hypertrophy. Left ventricular diastolic parameters are consistent with Grade II diastolic dysfunction (pseudonormalization).  2. Right  ventricular systolic function is normal. The right ventricular size is normal.  3. The mitral valve is grossly normal. No evidence of mitral valve regurgitation. No evidence of mitral stenosis.  4. The aortic valve is tricuspid. There is mild calcification of the aortic valve. There is mild thickening of the aortic valve. Aortic valve regurgitation is not visualized. Aortic valve sclerosis/calcification is present, without any evidence of aortic stenosis.  5. The inferior vena cava is normal in size with greater than 50% respiratory variability, suggesting right atrial pressure of 3 mmHg. Comparison(s): No significant change from prior study. FINDINGS  Left Ventricle: Left ventricular ejection fraction, by estimation, is 60 to 65%. The left ventricle has normal function. The left ventricle has no regional wall motion abnormalities. The left ventricular internal cavity size was normal in size. There is  mild concentric left ventricular hypertrophy. Left ventricular diastolic parameters are consistent with Grade II diastolic dysfunction (pseudonormalization). Right Ventricle: The right ventricular size is normal. Right vetricular wall thickness was not well visualized. Right ventricular systolic function is normal. Left Atrium: Left atrial size was normal in size. Right Atrium: Right atrial size was normal in size. Pericardium: There is no evidence of pericardial effusion. Mitral Valve: The mitral valve is grossly normal. Mild to moderate mitral annular calcification. No evidence of mitral valve regurgitation. No evidence of mitral valve stenosis. Tricuspid Valve: The tricuspid valve is normal in structure. Tricuspid valve regurgitation is trivial. Aortic Valve: The aortic valve is tricuspid. There is mild calcification of the aortic valve. There is mild thickening of the aortic valve. Aortic valve regurgitation is not visualized. Aortic valve sclerosis/calcification is present, without any evidence of aortic  stenosis. Pulmonic Valve: The pulmonic valve was normal in structure. Pulmonic valve regurgitation is trivial. Aorta: The aortic root and ascending aorta are structurally normal, with no evidence of dilitation. Venous: The inferior vena cava is normal in size with greater than 50% respiratory variability, suggesting right atrial pressure of 3 mmHg. IAS/Shunts: The atrial septum is grossly normal.  LEFT VENTRICLE PLAX 2D LVIDd:         3.90 cm   Diastology LVIDs:         2.80 cm   LV e' medial:    4.57 cm/s LV PW:         1.20 cm   LV E/e' medial:  23.2 LV IVS:        1.40 cm   LV e' lateral:   6.74 cm/s LVOT diam:     2.10 cm   LV E/e' lateral: 15.7 LV SV:         84 LV SV Index:   45 LVOT Area:     3.46 cm  RIGHT VENTRICLE RV S prime:     8.49 cm/s TAPSE (M-mode): 1.4 cm LEFT ATRIUM             Index        RIGHT ATRIUM           Index LA diam:        3.80 cm 2.05  cm/m   RA Area:     19.50 cm LA Vol (A2C):   39.1 ml 21.05 ml/m  RA Volume:   53.00 ml  28.53 ml/m LA Vol (A4C):   45.0 ml 24.22 ml/m LA Biplane Vol: 42.0 ml 22.61 ml/m  AORTIC VALVE LVOT Vmax:   95.80 cm/s LVOT Vmean:  75.000 cm/s LVOT VTI:    0.242 m  AORTA Ao Root diam: 2.40 cm Ao Asc diam:  2.80 cm MITRAL VALVE                TRICUSPID VALVE MV Area (PHT): 3.61 cm     TR Peak grad:   14.9 mmHg MV E velocity: 106.00 cm/s  TR Vmax:        193.00 cm/s MV A velocity: 85.90 cm/s MV E/A ratio:  1.23         SHUNTS                             Systemic VTI:  0.24 m                             Systemic Diam: 2.10 cm Laurance Flatten MD Electronically signed by Laurance Flatten MD Signature Date/Time: 02/28/2023/11:15:03 AM    Final    CT Angio Chest PE W and/or Wo Contrast  Result Date: 02/27/2023 CLINICAL DATA:  Epigastric pain, chest pain, abdominal pain, emesis and shortness of breath. History of DVT. EXAM: CT ANGIOGRAPHY CHEST CT ABDOMEN AND PELVIS WITH CONTRAST TECHNIQUE: Multidetector CT imaging of the chest was performed using the standard  protocol during bolus administration of intravenous contrast. Multiplanar CT image reconstructions and MIPs were obtained to evaluate the vascular anatomy. Multidetector CT imaging of the abdomen and pelvis was performed using the standard protocol during bolus administration of intravenous contrast. RADIATION DOSE REDUCTION: This exam was performed according to the departmental dose-optimization program which includes automated exposure control, adjustment of the mA and/or kV according to patient size and/or use of iterative reconstruction technique. CONTRAST:  60mL OMNIPAQUE IOHEXOL 350 MG/ML SOLN COMPARISON:  CTA chest 05/11/2022 and CT renal stone protocol 05/10/2022 FINDINGS: CTA CHEST FINDINGS Cardiovascular: Sternotomy and CABG. Coronary artery and aortic atherosclerotic calcification. Cardiomegaly. No pericardial effusion. Satisfactory opacification of the pulmonary arteries to the segmental level. No pulmonary embolism. Mediastinum/Nodes: Unremarkable trachea and esophagus. No thoracic adenopathy. Lungs/Pleura: Mild diffuse interlobular septal thickening and patchy ground-glass opacities greatest in the lower lungs, improved compared with 05/11/2022. Bibasilar atelectasis/scarring. No pleural effusion or pneumothorax. Musculoskeletal: No acute fracture. Review of the MIP images confirms the above findings. CT ABDOMEN and PELVIS FINDINGS Hepatobiliary: Unremarkable liver, gallbladder, and biliary tree. Pancreas: Unremarkable. Spleen: Unremarkable. Adrenals/Urinary Tract: Normal adrenal glands. Atrophic native kidneys. Right lower quadrant renal allograft no urinary calculi or hydronephrosis. Unremarkable bladder. Areas of cortical geographic hypoattenuation within the renal transplant suspicious for pyelonephritis. Stomach/Bowel: Normal caliber large and small bowel. Colonic diverticulosis without diverticulitis. Normal appendix. Stomach is within normal limits. Vascular/Lymphatic: Aortic atherosclerosis. No  enlarged abdominal or pelvic lymph nodes. Reproductive: Hysterectomy. Other: No free intraperitoneal fluid or air. 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 Superior endplate compression deformity of T10 is presumed chronic. No acute fracture. Review of the MIP images confirms the above findings. IMPRESSION: 1. Negative for acute pulmonary embolism. 2. Findings suggestive of congestive heart failure with mild pulmonary edema. 3. Areas of  geographic hypoattenuation within the right lower quadrant renal transplant suspicious for pyelonephritis. 4. 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 Electronically Signed   By: Minerva Fester M.D.   On: 02/27/2023 21:46   CT ABDOMEN PELVIS W CONTRAST  Result Date: 02/27/2023 CLINICAL DATA:  Epigastric pain, chest pain, abdominal pain, emesis and shortness of breath. History of DVT. EXAM: CT ANGIOGRAPHY CHEST CT ABDOMEN AND PELVIS WITH CONTRAST TECHNIQUE: Multidetector CT imaging of the chest was performed using the standard protocol during bolus administration of intravenous contrast. Multiplanar CT image reconstructions and MIPs were obtained to evaluate the vascular anatomy. Multidetector CT imaging of the abdomen and pelvis was performed using the standard protocol during bolus administration of intravenous contrast. RADIATION DOSE REDUCTION: This exam was performed according to the departmental dose-optimization program which includes automated exposure control, adjustment of the mA and/or kV according to patient size and/or use of iterative reconstruction technique. CONTRAST:  60mL OMNIPAQUE IOHEXOL 350 MG/ML SOLN COMPARISON:  CTA chest 05/11/2022 and CT renal stone protocol 05/10/2022 FINDINGS: CTA CHEST FINDINGS Cardiovascular: Sternotomy and CABG. Coronary artery and aortic atherosclerotic calcification. Cardiomegaly. No pericardial effusion. Satisfactory opacification of the  pulmonary arteries to the segmental level. No pulmonary embolism. Mediastinum/Nodes: Unremarkable trachea and esophagus. No thoracic adenopathy. Lungs/Pleura: Mild diffuse interlobular septal thickening and patchy ground-glass opacities greatest in the lower lungs, improved compared with 05/11/2022. Bibasilar atelectasis/scarring. No pleural effusion or pneumothorax. Musculoskeletal: No acute fracture. Review of the MIP images confirms the above findings. CT ABDOMEN and PELVIS FINDINGS Hepatobiliary: Unremarkable liver, gallbladder, and biliary tree. Pancreas: Unremarkable. Spleen: Unremarkable. Adrenals/Urinary Tract: Normal adrenal glands. Atrophic native kidneys. Right lower quadrant renal allograft no urinary calculi or hydronephrosis. Unremarkable bladder. Areas of cortical geographic hypoattenuation within the renal transplant suspicious for pyelonephritis. Stomach/Bowel: Normal caliber large and small bowel. Colonic diverticulosis without diverticulitis. Normal appendix. Stomach is within normal limits. Vascular/Lymphatic: Aortic atherosclerosis. No enlarged abdominal or pelvic lymph nodes. Reproductive: Hysterectomy. Other: No free intraperitoneal fluid or air. 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 Superior endplate compression deformity of T10 is presumed chronic. No acute fracture. Review of the MIP images confirms the above findings. IMPRESSION: 1. Negative for acute pulmonary embolism. 2. Findings suggestive of congestive heart failure with mild pulmonary edema. 3. Areas of geographic hypoattenuation within the right lower quadrant renal transplant suspicious for pyelonephritis. 4. 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 Electronically Signed   By: Minerva Fester M.D.   On: 02/27/2023 21:46   VAS [place] LOWER EXTREMITY VENOUS (DVT) (ONLY [facility] & [facility])  Result Date: 02/27/2023  Lower Venous DVT Study Patient  Name:  [name] [facility]  Date of Exam:   02/27/2023 Medical Rec #: [id number]        Accession #:    [id number] Date of Birth: 04-14-1961        Patient Gender: F Patient Age:   57 years Exam Location:  [facility] Procedure:      VAS [place] LOWER EXTREMITY VENOUS (DVT) Referring Phys: Lorenda Cahill HENDERLY --------------------------------------------------------------------------------  Indications: Swelling, and Patient had ankle surgery December 2023. Bedrest until February. Placed in cam walker in February, just released from wearing cam walker within the last month.  Limitations: Pain with compression. Unable to turn lights off in room. Comparison Study: Prior right LEV done at [facility] 09/10/2022. Negative for  DVT. Performing Technologist: Sherren Kerns RVS  Examination Guidelines: A complete evaluation includes B-mode imaging, spectral Doppler, color Doppler, and power Doppler as needed of all accessible portions of each vessel. Bilateral testing is considered an integral part of a complete examination. Limited examinations for reoccurring indications may be performed as noted. The reflux portion of the exam is performed with the patient in reverse Trendelenburg.  +---------+---------------+---------+-----------+----------+-------------------+ RIGHT    CompressibilityPhasicitySpontaneityPropertiesThrombus Aging      +---------+---------------+---------+-----------+----------+-------------------+ CFV      Full           Yes      Yes                                      +---------+---------------+---------+-----------+----------+-------------------+ SFJ      Full                                                             +---------+---------------+---------+-----------+----------+-------------------+ FV Prox  Full           Yes                                               +---------+---------------+---------+-----------+----------+-------------------+ FV Mid   Full                                                              +---------+---------------+---------+-----------+----------+-------------------+ FV DistalFull           Yes      Yes                                      +---------+---------------+---------+-----------+----------+-------------------+ PFV      Full           Yes      Yes                                      +---------+---------------+---------+-----------+----------+-------------------+ POP      Full           Yes      Yes                                      +---------+---------------+---------+-----------+----------+-------------------+ PTV      Full                                                             +---------+---------------+---------+-----------+----------+-------------------+ PERO  Not well visualized +---------+---------------+---------+-----------+----------+-------------------+ Gastroc                 Yes      Yes                                      +---------+---------------+---------+-----------+----------+-------------------+   +----+---------------+---------+-----------+----------+--------------+ LEFTCompressibilityPhasicitySpontaneityPropertiesThrombus Aging +----+---------------+---------+-----------+----------+--------------+ CFV Full           Yes      Yes                                 +----+---------------+---------+-----------+----------+--------------+     Summary: RIGHT: - There is no evidence of deep vein thrombosis in the lower extremity.  - No cystic structure found in the popliteal fossa.  LEFT: - No evidence of common femoral vein obstruction.  *See table(s) above for measurements and observations.    Preliminary    DG Chest Portable 1 View  Result Date: 02/27/2023 CLINICAL DATA:  Shortness of breath. EXAM: PORTABLE CHEST 1 VIEW COMPARISON:  05/10/2022 FINDINGS: The cardio pericardial silhouette is enlarged.  The lungs are clear without focal pneumonia, edema, pneumothorax or pleural effusion. Subtle atelectasis or scarring noted at the left base. Vascular stent device noted in the left axillary region. No acute bony abnormality. Telemetry leads overlie the chest. IMPRESSION: No acute cardiopulmonary findings. Subtle atelectasis or scarring is noted at the left base. Electronically Signed   By: Kennith Center M.D.   On: 02/27/2023 17:59    Pending Labs Unresulted Labs (From admission, onward)     Start     Ordered   03/01/23 0500  Basic metabolic panel  Daily,   R     Comments: As Scheduled for 5 days    02/28/23 0025   03/01/23 0500  CBC  Daily,   R      02/28/23 1318   02/27/23 2154  Blood culture (routine x 2)  BLOOD CULTURE X 2,   R      02/27/23 2153   02/27/23 1507  Urine Culture  Once,   URGENT       Question Answer Comment  Indication Dysuria   Patient immune status Immunocompromised   Release to patient Immediate      02/27/23 1507            Vitals/Pain Today's Vitals   02/28/23 0700 02/28/23 1000 02/28/23 1038 02/28/23 1300  BP: (!) 152/65 (!) 145/69  (!) 144/60  Pulse: 72 65  72  Resp: 16 18  16   Temp:   (!) 97.5 F (36.4 C)   TempSrc:   Oral   SpO2: 93% 98%  98%  Weight:      Height:      PainSc:        Isolation Precautions Airborne and Contact precautions  Medications Medications  sodium chloride flush (NS) 0.9 % injection 3 mL (3 mLs Intravenous Given 02/28/23 1036)  sodium chloride flush (NS) 0.9 % injection 3 mL (has no administration in time range)  0.9 %  sodium chloride infusion (has no administration in time range)  acetaminophen (TYLENOL) tablet 650 mg (650 mg Oral Given 02/28/23 1025)  ondansetron (ZOFRAN) injection 4 mg (has no administration in time range)  insulin glargine-yfgn (SEMGLEE) injection 10 Units (0 Units Subcutaneous Hold 02/28/23 0158)  mycophenolate (MYFORTIC) EC tablet 360 mg (360 mg Oral  Given 02/28/23 1028)  predniSONE  (DELTASONE) tablet 5 mg (5 mg Oral Given 02/28/23 1026)  tacrolimus ER (ENVARSUS XR) tablet 6 mg (6 mg Oral Given 02/28/23 1027)  sulfamethoxazole-trimethoprim (BACTRIM) 400-80 MG per tablet 1 tablet (1 tablet Oral Given 02/28/23 1028)  carvedilol (COREG) tablet 25 mg (25 mg Oral Given 02/28/23 1026)  atorvastatin (LIPITOR) tablet 20 mg (20 mg Oral Given 02/28/23 1025)  aspirin chewable tablet 81 mg (81 mg Oral Given 02/28/23 1026)  NIFEdipine (PROCARDIA XL/NIFEDICAL XL) 24 hr tablet 60 mg (60 mg Oral Given 02/28/23 1028)  oxyCODONE-acetaminophen (PERCOCET) 7.5-325 MG per tablet 1 tablet (1 tablet Oral Given 02/28/23 1353)  levothyroxine (SYNTHROID) tablet 100 mcg (0 mcg Oral Hold 02/28/23 0515)  albuterol (PROVENTIL) (2.5 MG/3ML) 0.083% nebulizer solution 2.5 mg (has no administration in time range)  linaclotide (LINZESS) capsule 290 mcg (290 mcg Oral Given 02/28/23 1025)  gabapentin (NEURONTIN) capsule 300 mg (300 mg Oral Given 02/28/23 1027)  cefdinir (OMNICEF) capsule 300 mg (300 mg Oral Given 02/28/23 1028)  cyclobenzaprine (FLEXERIL) tablet 10 mg (10 mg Oral Given 02/28/23 1025)  cholecalciferol (VITAMIN D3) 25 MCG (1000 UNIT) tablet 2,000 Units (2,000 Units Oral Given 02/28/23 1026)  famotidine (PEPCID) tablet 40 mg (has no administration in time range)  insulin aspart (novoLOG) injection 0-9 Units (has no administration in time range)  diphenhydrAMINE (BENADRYL) capsule 25 mg (25 mg Oral Given 02/28/23 1234)  apixaban (ELIQUIS) tablet 5 mg (5 mg Oral Given 02/28/23 1329)  sodium chloride 0.9 % bolus 500 mL (0 mLs Intravenous Stopped 02/27/23 2222)  ondansetron (ZOFRAN) injection 4 mg (4 mg Intravenous Given 02/27/23 1902)  morphine (PF) 4 MG/ML injection 4 mg (4 mg Intravenous Given 02/27/23 1902)  iohexol (OMNIPAQUE) 350 MG/ML injection 60 mL (60 mLs Intravenous Contrast Given 02/27/23 2123)  sodium chloride 0.9 % bolus 500 mL (0 mLs Intravenous Stopped 02/28/23 0133)  cefTRIAXone (ROCEPHIN) 1 g in sodium  chloride 0.9 % 100 mL IVPB (0 g Intravenous Stopped 02/28/23 0133)  morphine (PF) 4 MG/ML injection 4 mg (4 mg Intravenous Given 02/27/23 2306)    Mobility non-ambulatory     Focused Assessments Renal Assessment Handoff:pyelonephritis   History of kidney transplant Restricted appendage: left arm   R Recommendations: See Admitting Provider Note  Report given to:   Additional Notes:

## 2023-02-28 NOTE — Progress Notes (Signed)
Rounding Note    Patient Name: [name] Date of Encounter: 02/28/2023  [facility] Cardiologist: None   Subjective   No chest pain this morning, still with mild abd pain.   Inpatient Medications    Scheduled Meds:  aspirin  81 mg Oral Daily   atorvastatin  20 mg Oral Daily   carvedilol  25 mg Oral BID WC   cefdinir  300 mg Oral BID   cholecalciferol  2,000 Units Oral Daily   gabapentin  300 mg Oral TID   insulin aspart  0-9 Units Subcutaneous Q4H   insulin glargine-yfgn  10 Units Subcutaneous QHS   levothyroxine  100 mcg Oral Q0600   linaclotide  290 mcg Oral QAC breakfast   mycophenolate  360 mg Oral BID   NIFEdipine  60 mg Oral BID   predniSONE  5 mg Oral Daily   sodium chloride flush  3 mL Intravenous Q12H   sulfamethoxazole-trimethoprim  1 tablet Oral Q M,W,F   tacrolimus ER  6 mg Oral Daily   Continuous Infusions:  sodium chloride     heparin 1,100 Units/hr (02/28/23 0133)   PRN Meds: sodium chloride, acetaminophen, albuterol, cyclobenzaprine, famotidine, ondansetron (ZOFRAN) IV, oxyCODONE-acetaminophen, sodium chloride flush   Vital Signs    Vitals:   02/28/23 0230 02/28/23 0351 02/28/23 0413 02/28/23 0700  BP: (!) 163/77 (!) 160/68  (!) 152/65  Pulse: 80 79  72  Resp: 15 19  16   Temp:   98 F (36.7 C)   TempSrc:   Oral   SpO2: 92% 94%  93%  Weight:      Height:        Intake/Output Summary (Last 24 hours) at 02/28/2023 0754 Last data filed at 02/28/2023 0133 Gross per 24 hour  Intake 1100 ml  Output --  Net 1100 ml      02/27/2023    2:39 PM 05/17/2022    6:20 AM 05/16/2022    4:19 AM  Last 3 Weights  Weight (lbs) 187 lb 187 lb 3.2 oz 188 lb 4.8 oz  Weight (kg) 84.823 kg 84.913 kg 85.412 kg      Telemetry    Sinus bradycardia - Personally Reviewed  ECG    Sinus Rhythm, 89 bpm - Personally Reviewed  Physical Exam   GEN: No acute distress.   Neck: No JVD Cardiac: RRR, soft systolic murmur, no rubs, or gallops.   Respiratory: Crackles in bases GI: Soft, nontender, non-distended  MS: No edema; No deformity. Neuro:  Nonfocal  Psych: Normal affect   Labs    High Sensitivity Troponin:   Recent Labs  Lab 02/27/23 1900 02/27/23 2150 02/27/23 2300 02/28/23 0115  TROPONINIHS 242* 216* 220* 228*     Chemistry Recent Labs  Lab 02/27/23 1527  NA 135  K 3.4*  CL 97*  CO2 23  GLUCOSE 217*  BUN 33*  CREATININE 1.33*  CALCIUM 9.5  PROT 7.6  ALBUMIN 3.5  AST 18  ALT 13  ALKPHOS 82  BILITOT 0.4  GFRNONAA 45*  ANIONGAP 15    Lipids No results for input(s): "CHOL", "TRIG", "HDL", "LABVLDL", "LDLCALC", "CHOLHDL" in the last 168 hours.  Hematology Recent Labs  Lab 02/27/23 1527 02/28/23 0115  WBC 4.5 4.0  RBC 6.02* 5.30*  HGB 12.7 11.1*  HCT 41.7 36.3  MCV 69.3* 68.5*  MCH 21.1* 20.9*  MCHC 30.5 30.6  RDW 19.3* 18.6*  PLT 193 180   Thyroid No results for input(s): "TSH", "FREET4"  in the last 168 hours.  BNP Recent Labs  Lab 02/27/23 1527  BNP 195.0*    DDimer No results for input(s): "DDIMER" in the last 168 hours.   Radiology    CT Angio Chest PE W and/or Wo Contrast  Result Date: 02/27/2023 CLINICAL DATA:  Epigastric pain, chest pain, abdominal pain, emesis and shortness of breath. History of DVT. EXAM: CT ANGIOGRAPHY CHEST CT ABDOMEN AND PELVIS WITH CONTRAST TECHNIQUE: Multidetector CT imaging of the chest was performed using the standard protocol during bolus administration of intravenous contrast. Multiplanar CT image reconstructions and MIPs were obtained to evaluate the vascular anatomy. Multidetector CT imaging of the abdomen and pelvis was performed using the standard protocol during bolus administration of intravenous contrast. RADIATION DOSE REDUCTION: This exam was performed according to the departmental dose-optimization program which includes automated exposure control, adjustment of the mA and/or kV according to patient size and/or use of iterative reconstruction  technique. CONTRAST:  60mL OMNIPAQUE IOHEXOL 350 MG/ML SOLN COMPARISON:  CTA chest 05/11/2022 and CT renal stone protocol 05/10/2022 FINDINGS: CTA CHEST FINDINGS Cardiovascular: Sternotomy and CABG. Coronary artery and aortic atherosclerotic calcification. Cardiomegaly. No pericardial effusion. Satisfactory opacification of the pulmonary arteries to the segmental level. No pulmonary embolism. Mediastinum/Nodes: Unremarkable trachea and esophagus. No thoracic adenopathy. Lungs/Pleura: Mild diffuse interlobular septal thickening and patchy ground-glass opacities greatest in the lower lungs, improved compared with 05/11/2022. Bibasilar atelectasis/scarring. No pleural effusion or pneumothorax. Musculoskeletal: No acute fracture. Review of the MIP images confirms the above findings. CT ABDOMEN and PELVIS FINDINGS Hepatobiliary: Unremarkable liver, gallbladder, and biliary tree. Pancreas: Unremarkable. Spleen: Unremarkable. Adrenals/Urinary Tract: Normal adrenal glands. Atrophic native kidneys. Right lower quadrant renal allograft no urinary calculi or hydronephrosis. Unremarkable bladder. Areas of cortical geographic hypoattenuation within the renal transplant suspicious for pyelonephritis. Stomach/Bowel: Normal caliber large and small bowel. Colonic diverticulosis without diverticulitis. Normal appendix. Stomach is within normal limits. Vascular/Lymphatic: Aortic atherosclerosis. No enlarged abdominal or pelvic lymph nodes. Reproductive: Hysterectomy. Other: No free intraperitoneal fluid or air. 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 Superior endplate compression deformity of T10 is presumed chronic. No acute fracture. Review of the MIP images confirms the above findings. IMPRESSION: 1. Negative for acute pulmonary embolism. 2. Findings suggestive of congestive heart failure with mild pulmonary edema. 3. Areas of geographic hypoattenuation within the right lower  quadrant renal transplant suspicious for pyelonephritis. 4. 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 Electronically Signed   By: Minerva Fester M.D.   On: 02/27/2023 21:46   CT ABDOMEN PELVIS W CONTRAST  Result Date: 02/27/2023 CLINICAL DATA:  Epigastric pain, chest pain, abdominal pain, emesis and shortness of breath. History of DVT. EXAM: CT ANGIOGRAPHY CHEST CT ABDOMEN AND PELVIS WITH CONTRAST TECHNIQUE: Multidetector CT imaging of the chest was performed using the standard protocol during bolus administration of intravenous contrast. Multiplanar CT image reconstructions and MIPs were obtained to evaluate the vascular anatomy. Multidetector CT imaging of the abdomen and pelvis was performed using the standard protocol during bolus administration of intravenous contrast. RADIATION DOSE REDUCTION: This exam was performed according to the departmental dose-optimization program which includes automated exposure control, adjustment of the mA and/or kV according to patient size and/or use of iterative reconstruction technique. CONTRAST:  60mL OMNIPAQUE IOHEXOL 350 MG/ML SOLN COMPARISON:  CTA chest 05/11/2022 and CT renal stone protocol 05/10/2022 FINDINGS: CTA CHEST FINDINGS Cardiovascular: Sternotomy and CABG. Coronary artery and aortic atherosclerotic calcification. Cardiomegaly. No pericardial effusion.  Satisfactory opacification of the pulmonary arteries to the segmental level. No pulmonary embolism. Mediastinum/Nodes: Unremarkable trachea and esophagus. No thoracic adenopathy. Lungs/Pleura: Mild diffuse interlobular septal thickening and patchy ground-glass opacities greatest in the lower lungs, improved compared with 05/11/2022. Bibasilar atelectasis/scarring. No pleural effusion or pneumothorax. Musculoskeletal: No acute fracture. Review of the MIP images confirms the above findings. CT ABDOMEN and PELVIS FINDINGS Hepatobiliary: Unremarkable liver, gallbladder, and  biliary tree. Pancreas: Unremarkable. Spleen: Unremarkable. Adrenals/Urinary Tract: Normal adrenal glands. Atrophic native kidneys. Right lower quadrant renal allograft no urinary calculi or hydronephrosis. Unremarkable bladder. Areas of cortical geographic hypoattenuation within the renal transplant suspicious for pyelonephritis. Stomach/Bowel: Normal caliber large and small bowel. Colonic diverticulosis without diverticulitis. Normal appendix. Stomach is within normal limits. Vascular/Lymphatic: Aortic atherosclerosis. No enlarged abdominal or pelvic lymph nodes. Reproductive: Hysterectomy. Other: No free intraperitoneal fluid or air. 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 Superior endplate compression deformity of T10 is presumed chronic. No acute fracture. Review of the MIP images confirms the above findings. IMPRESSION: 1. Negative for acute pulmonary embolism. 2. Findings suggestive of congestive heart failure with mild pulmonary edema. 3. Areas of geographic hypoattenuation within the right lower quadrant renal transplant suspicious for pyelonephritis. 4. 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 Electronically Signed   By: Minerva Fester M.D.   On: 02/27/2023 21:46   VAS [place] LOWER EXTREMITY VENOUS (DVT) (ONLY [facility] & [facility])  Result Date: 02/27/2023  Lower Venous DVT Study Patient Name:  CECILEY SAILORS [facility]  Date of Exam:   02/27/2023 Medical Rec #: [id number]        Accession #:    [id number] Date of Birth: 1961-01-28        Patient Gender: F Patient Age:   10 years Exam Location:  [facility] Procedure:      VAS [place] LOWER EXTREMITY VENOUS (DVT) Referring Phys: Lorenda Cahill HENDERLY --------------------------------------------------------------------------------  Indications: Swelling, and Patient had ankle surgery December 2023. Bedrest until February. Placed in cam walker in February, just released from wearing cam walker  within the last month.  Limitations: Pain with compression. Unable to turn lights off in room. Comparison Study: Prior right LEV done at [facility] 09/10/2022. Negative for                   DVT. Performing Technologist: Sherren Kerns RVS  Examination Guidelines: A complete evaluation includes B-mode imaging, spectral Doppler, color Doppler, and power Doppler as needed of all accessible portions of each vessel. Bilateral testing is considered an integral part of a complete examination. Limited examinations for reoccurring indications may be performed as noted. The reflux portion of the exam is performed with the patient in reverse Trendelenburg.  +---------+---------------+---------+-----------+----------+-------------------+ RIGHT    CompressibilityPhasicitySpontaneityPropertiesThrombus Aging      +---------+---------------+---------+-----------+----------+-------------------+ CFV      Full           Yes      Yes                                      +---------+---------------+---------+-----------+----------+-------------------+ SFJ      Full                                                             +---------+---------------+---------+-----------+----------+-------------------+  FV Prox  Full           Yes                                               +---------+---------------+---------+-----------+----------+-------------------+ FV Mid   Full                                                             +---------+---------------+---------+-----------+----------+-------------------+ FV DistalFull           Yes      Yes                                      +---------+---------------+---------+-----------+----------+-------------------+ PFV      Full           Yes      Yes                                      +---------+---------------+---------+-----------+----------+-------------------+ POP      Full           Yes      Yes                                       +---------+---------------+---------+-----------+----------+-------------------+ PTV      Full                                                             +---------+---------------+---------+-----------+----------+-------------------+ PERO                                                  Not well visualized +---------+---------------+---------+-----------+----------+-------------------+ Gastroc                 Yes      Yes                                      +---------+---------------+---------+-----------+----------+-------------------+   +----+---------------+---------+-----------+----------+--------------+ LEFTCompressibilityPhasicitySpontaneityPropertiesThrombus Aging +----+---------------+---------+-----------+----------+--------------+ CFV Full           Yes      Yes                                 +----+---------------+---------+-----------+----------+--------------+     Summary: RIGHT: - There is no evidence of deep vein thrombosis in the lower extremity.  - No cystic structure found in the popliteal fossa.  LEFT: - No evidence of common femoral vein obstruction.  *See table(s) above for measurements and observations.  Preliminary    DG Chest Portable 1 View  Result Date: 02/27/2023 CLINICAL DATA:  Shortness of breath. EXAM: PORTABLE CHEST 1 VIEW COMPARISON:  05/10/2022 FINDINGS: The cardio pericardial silhouette is enlarged. The lungs are clear without focal pneumonia, edema, pneumothorax or pleural effusion. Subtle atelectasis or scarring noted at the left base. Vascular stent device noted in the left axillary region. No acute bony abnormality. Telemetry leads overlie the chest. IMPRESSION: No acute cardiopulmonary findings. Subtle atelectasis or scarring is noted at the left base. Electronically Signed   By: Kennith Center M.D.   On: 02/27/2023 17:59    Cardiac Studies   N/a   Patient Profile     62 y.o. female with PMH of CAD s/p 3v CABG' 2023 (LIMA to  LAD, SVG to OM, SVG to PDA), ESRD s/p renal transplant, cardiac arrest, DM, HTN, AVN of femoral heads, DVT on Eliquis and freq UTI who presented to ED with complaints of chest pain.   Assessment & Plan    Chest pain Elevated troponin CAD s/p CABG '2023 -- presented with chest pain/abd pain for the past couple of day. Notes more of a fluttering type sensation in her chest yesterday prompting presentation to the ED.  -- hsTn 242>>216>>220>>228, EKG without ischemic changes -- symptoms are somewhat atypical in nature, suspect may be demand from volume overload? -- initial thought was for stress test. Echo pending, will plan further testing based on results  -- on IV heparin for now, Eliquis held  Acute on chronic HFpEF -- reports feeling volume overloaded over the past couple of days -- BNP 195 -- CT angio with no PE, pulmonary edema, mild crackles in bases -- on torsemide 40mg  BID PTA -- echo pending  ESRD s/p renal transplant Recent pyelonephritis  -- CT abd suggestive of pyelonephritis but UA clear on admission, normal WBC and afebrile  -- per primary -- planned for nephrology consult today  Pulmonary fibrosis -- uses home O2 as needed during the day, constant at night -- feels she has been needed to use more freq  Hx of DVT -- Eliquis on hold for now, IV heparin  DM -- Hgb A1c 7.7 -- on SSI  HLD -- on statin  HTN -- blood pressures elevated while in the ED (has not received morning meds) -- continue coreg 25mg  BID,nifedipine 60mg  BID   Hypokalemia -- K+ 3.4, supp -- BMET pending   For questions or updates, please contact [facility] Please consult [email or web address] for contact info under        Signed, Laverda Page, NP  02/28/2023, 7:54 AM

## 2023-02-28 NOTE — Assessment & Plan Note (Addendum)
PIPF of RLL and intermittent use of home O2 at baseline following COVID-19 back in 2020.  Not on O2 at all times though.

## 2023-02-28 NOTE — TOC Initial Note (Signed)
Transition of Care [facility]) - Initial/Assessment Note    Patient Details  Name: [name] MRN: [id number] Date of Birth: Aug 18, 1961  [facility] CM/SW Contact:    Leone Haven, RN Phone Number: 02/28/2023, 4:38 PM  Clinical Narrative:                 From home alone, she has PCP, Frederich Cha, she has insurance coverage on file which covers her medications.  She has HH services with [facility]  [facility].  She has BSC, shower chair, bp cuff, scale, home oxygen with [employer] 2.5 to 3 liters. She still drives. She  calls one of her family members also to take her to MD apts.  Support system is her  family.  She states she will be driving her self home at dc. She is visiting for her grand daughter graduation.  She gets meds from [facility] in [place] on [address].         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] AKI (acute kidney injury) (HCC) [N17.9] Chest pain, rule out acute myocardial infarction [R07.9] Acute on chronic congestive heart failure, unspecified heart failure type (HCC) [I50.9] Chronic hypoxic respiratory failure (HCC) [J96.11] Patient Active Problem List   Diagnosis Date Noted   History of cardiac arrest 02/28/2023   Chest pain, rule out acute myocardial infarction 02/28/2023   Pulmonary fibrosis, postinflammatory (HCC) 02/28/2023   Elevated troponin 02/28/2023   Hx of CABG 02/28/2023   Acute on chronic diastolic (congestive) heart failure (HCC) 05/12/2022   Kidney transplant status, cadaveric 05/12/2022   Hyponatremia  05/12/2022   Iron deficiency anemia 05/12/2022   Acute cystitis with hematuria    Pressure injury of skin 04/27/2022   Fluid overload 03/28/2016   HCAP (healthcare-associated pneumonia) 11/09/2015   Sinus tachycardia    Acute on chronic respiratory failure with hypoxemia (HCC) 06/11/2015   Pleuritic chest pain 06/11/2015   Diabetes mellitus, type II, insulin dependent (HCC)    Hypothyroidism (acquired)    Hyperlipidemia    Anemia due to pre-end-stage renal disease treated with erythropoietin    Diabetic peripheral neuropathy associated with type 2 diabetes mellitus (HCC)    Hyperkalemia 10/07/2013   Peripheral arterial disease (HCC) 08/20/2013   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/08/2013   Sepsis (HCC) 06/05/2013   Pain in limb-Left upper arm 04/20/2013   Mechanical complication of other vascular device, implant, and graft 04/06/2013   End stage renal disease on dialysis due to type 2 diabetes mellitus (HCC) 04/06/2013   Cardiac arrest (HCC) 07/11/2012   Morbid obesity (HCC) 07/11/2012   Acute respiratory failure with hypoxia (HCC) 07/11/2012  Essential hypertension: Associated with renal disease    Thyroid disease    Paroxysmal supraventricular tachycardia 03/25/2010   PCP:  Merlyn Albert, FNP Pharmacy:   [facility] - [place], [place] - [address] [place] [address] Phone: [phone] Fax: [phone]  [id number] - [place], [place] - [address] AT [facility] [address] [place] [address] Phone: [phone] Fax: [phone]     Social Determinants of Health (SDOH) Social History: SDOH Screenings   Tobacco Use: Medium Risk (02/27/2023)   SDOH Interventions:     Readmission Risk Interventions    05/12/2022    4:29 PM  Readmission Risk Prevention Plan  Transportation Screening Complete  PCP or Specialist Appt within 3-5 Days  Complete  HRI or Home Care Consult Complete  Social Work Consult for Recovery Care Planning/Counseling Complete  Palliative Care Screening Not Applicable  Medication Review Oceanographer) Complete

## 2023-02-28 NOTE — H&P (Signed)
History and Physical    Patient: [name]:[id number] DOB: [birth year] DOA: 02/27/2023 DOS: the patient was seen and examined on 02/28/2023 PCP: Merlyn Albert, FNP  Patient coming from: Home  Chief Complaint:  Chief Complaint  Patient presents with   Weakness   HPI: [name] is a 62 y.o. 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  Pt presents to ED tonight with c/o fatige, intermittent chest tightness over past 3 weeks.  She has recently been battling UTI / pyelonephritis with a couple of courses of ABx.  Abd pain / flank pain has improved somewhat since starting most recent course of ABx on 6/6.  Chest tightness is intermittent, doesn't seem associated with activity.  Prior to 3 weeks ago did not have any chest tightness / fatigue / cardiac symptoms at baseline.  Trop elevated in ED.   Review of Systems: As mentioned in the history of present illness. All other systems reviewed and are negative. 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 On multiple medications.   Gastroparesis due to DM [facility])    GERD (gastroesophageal reflux disease)    Heart murmur    Hemodialysis patient [facility])    Hyperlipidemia    Hypothyroidism (acquired)    Paroxysmal supraventricular tachycardia    Peripheral arterial disease [facility]) December 2014   @ [facility]  Cardiology- Billie Lade: a) RSFA PTA (01/2013); b) L SFA [place] Atherectomy/PTA with 3V runoff; LEA Dopplers January 2016 [facility] post L SFA PTA): Mild, insignificant disease in the left CFA, profunda, SFA, popliteal artery and tibioperoneal trunk.   Sickle cell trait [facility])    Past Surgical History:  Procedure Laterality Date   ABDOMINAL HYSTERECTOMY     ARTERIOVENOUS GRAFT PLACEMENT Left 2012   Dr. Tenny Craw  2nd graft   Carotid ultrasound   May 2011   Normal [facility] [place], [place])   CATARACT EXTRACTION, BILATERAL     COLONOSCOPY  07/19/2012   Procedure: COLONOSCOPY;  Surgeon: Theda Belfast, MD;  Location: [facility] ENDOSCOPY;  Service: Endoscopy;  Laterality: N/A;   FISTULOGRAM Left 06/04/2015   Procedure: FISTULOGRAM;  Surgeon: Fransisco Hertz, MD;  Location: [facility] OR;  Service: Vascular;  Laterality: Left;   KNEE ARTHROPLASTY     KNEE SURGERY     right   NM MYOVIEW LTD  September 2009; 01/15/2015   a) @ [facility] - Negative for inducible or reversible ischemia with pharmacologic stress. EF 63% ; b) [facility]: EF 68%. No RWMA, negative S4 reversible coronary ischemia or infarction.   PERIPHERAL ANGIOPLASTY  May 2014   PTA of R SFA-PopA.   PERIPHERAL ATHRECTOMY  December 2014   [place] Atherectomy of L SFA-L PopA reduction of 60% to 10% stenosis    PERIPHERAL VASCULAR CATHETERIZATION  May 2014; December 2014   a) RSFA & PopA mod-severe  disease -- R SFA PTA, Med Rx of L SFA; b) distal abdominal and iliac & CFA arteries patent.  m-d LSFA long 60%, LPop 60% -> 3 V runoff. RSFA ~40% mid, RPop 60%, 3V runoff   REVISION OF ARTERIOVENOUS GORETEX GRAFT Left 06/04/2015   Procedure: REVISION OF ARTERIOVENOUS GORETEX GRAFT;  Surgeon: Fransisco Hertz, MD;  Location[facility] OR;  Service: Vascular;  Laterality: Left;   SHUNTOGRAM Left 02/14/2013   Procedure: FISTULOGRAM;  Surgeon: Nada Libman, MD;  Location: [facility] CATH LAB;  Service: Cardiovascular;  Laterality: Left;   THYROID SURGERY      THYROIDECTOMY     TRANSTHORACIC ECHOCARDIOGRAM  October 2013; January 2015   a) [facility]) 2013: normal LV size and function. Mild LVH. EF 55-60%. no RWMA, No valve disease; b) [facility] Card 1/'15: EF 70-75%, mild MR. Normal diastolic dysfunction   Social History:  reports that she quit smoking about 29 years ago. Her smoking use included cigarettes. She has never used smokeless tobacco. She reports current drug use. Drug: Marijuana. She reports that she does not drink alcohol.  Allergies  Allergen Reactions   Lisinopril Swelling    Swelling of tongue   Hydralazine     Other reaction(s): heart palpitations    Family History  Problem Relation Age of Onset   Diabetes Mother    Hypertension Mother    Heart attack Mother    Diabetes Father    Hypertension Father     Prior to Admission medications   Medication Sig Start Date End Date Taking? Authorizing Provider  apixaban (ELIQUIS) 5 MG TABS tablet Take 5 mg by mouth 2 (two) times daily. 07/21/21  Yes [provider]  aspirin 81 MG chewable tablet Chew 81 mg by mouth daily.   Yes [provider]  atorvastatin (LIPITOR) 20 MG tablet Take 20 mg by mouth daily. 12/16/21  Yes [provider]  carvedilol (COREG) 12.5 MG tablet Take 1 tablet (12.5 mg total) by mouth 2 (two) times daily with a meal. Patient taking differently: Take 25 mg by mouth 2 (two) times daily with a meal. 05/17/22  Yes Mapp, Tavien, MD  cefdinir (OMNICEF) 300 MG capsule Take 300 mg by mouth 2 (two) times daily. 02/24/23 03/06/23 Yes [provider]  Cholecalciferol (VITAMIN D) 50 MCG (2000 UT) tablet Take 2,000 Units by mouth daily.   Yes [provider]  cyclobenzaprine (FLEXERIL) 10 MG tablet Take 10 mg by mouth 2 (two) times daily as needed for muscle spasms. 03/05/22  Yes [provider]  famotidine (PEPCID) 40 MG tablet Take 40 mg by mouth daily as needed for heartburn or indigestion.   Yes [provider]   gabapentin (NEURONTIN) 300 MG capsule Take 300 mg by mouth 3 (three) times daily. 03/01/22  Yes [provider]  Insulin Glargine w/ Trans Port 100 UNIT/ML SOPN Inject 15 Units into the skin at bedtime. 01/17/20  Yes [provider]  levothyroxine (SYNTHROID) 100 MCG tablet Take 100 mcg by mouth daily before breakfast.   Yes [provider]  linaclotide (LINZESS) 290 MCG CAPS capsule Take 290 mcg by mouth daily before breakfast. 06/07/19  Yes [provider]  mycophenolate (MYFORTIC) 180 MG EC tablet Take 360 mg by mouth 2 (two) times daily. 12/26/18  Yes [provider]  NIFEdipine (PROCARDIA-XL/ADALAT CC) 60 MG 24 hr tablet Take 1 tablet (60 mg total) by mouth daily. Patient taking differently: Take 60 mg by mouth in the morning and  at bedtime. 03/31/16  Yes Dorothea Ogle, MD  nitroGLYCERIN (NITROSTAT) 0.4 MG SL tablet Place 0.4 mg under the tongue as needed for chest pain. 01/28/22  Yes [provider]  NOVOLOG FLEXPEN 100 UNIT/ML FlexPen Inject 5-15 Units into the skin with breakfast, with lunch, and with evening meal. 01/22/22  Yes [provider]  oxyCODONE-acetaminophen (PERCOCET) 7.5-325 MG tablet Take 1 tablet by mouth every 8 (eight) hours as needed for severe pain.   Yes [provider]  predniSONE (DELTASONE) 5 MG tablet Take 5 mg by mouth daily. 05/26/21  Yes [provider]  Probiotic Product (PROBIOTIC PO) Take 1 capsule by mouth daily.   Yes [provider]  promethazine (PHENERGAN) 25 MG tablet Take 25 mg by mouth every 8 (eight) hours as needed for nausea or vomiting.    Yes [provider]  Semaglutide 14 MG TABS Take 14 mg by mouth daily. 03/05/22  Yes [provider]  sulfamethoxazole-trimethoprim (BACTRIM) 400-80 MG tablet Take 1 tablet by mouth every Monday, Wednesday, and Friday. 05/26/21  Yes [provider]  tacrolimus ER (ENVARSUS XR) 1 MG TB24 Take 6 mg by mouth daily.  05/26/21  Yes [provider]  torsemide (DEMADEX) 20 MG tablet Take 2 tablets (40 mg total) by mouth daily. Patient taking differently: Take 40 mg by mouth 2 (two) times daily. 05/17/22  Yes Mapp, Tavien, MD  VENTOLIN HFA 108 (90 Base) MCG/ACT inhaler Inhale 1 puff into the lungs every 4 (four) hours as needed for wheezing or shortness of breath. 02/03/22  Yes [provider]    Physical Exam: Vitals:   02/27/23 2130 02/27/23 2145 02/27/23 2200 02/28/23 0000  BP:  (!) 159/67 (!) 160/70   Pulse: 86 90 80   Resp: 15 (!) 24 12   Temp:    98.2 F (36.8 C)  TempSrc:    Oral  SpO2: 100% 99% 100%   Weight:      Height:       Constitutional: NAD, calm, comfortable Respiratory: clear to auscultation bilaterally, no wheezing, no crackles. Normal respiratory effort. No accessory muscle use.  Cardiovascular: Regular rate and rhythm, Murmur present Abdomen: no tenderness, no masses palpated. No hepatosplenomegaly. Bowel sounds positive.  Neurologic: CN 2-12 grossly intact. Sensation intact, DTR normal. Strength 5/5 in all 4.  Psychiatric: Normal judgment and insight. Alert and oriented x 3. Normal mood.   Data Reviewed:    Labs on Admission: I have personally reviewed following labs and imaging studies  CBC: Recent Labs  Lab 02/27/23 1527  WBC 4.5  NEUTROABS 2.9  HGB 12.7  HCT 41.7  MCV 69.3*  PLT 193   Basic Metabolic Panel: Recent Labs  Lab 02/27/23 1527  NA 135  K 3.4*  CL 97*  CO2 23  GLUCOSE 217*  BUN 33*  CREATININE 1.33*  CALCIUM 9.5   GFR: Estimated Creatinine Clearance: 44.3 mL/min (A) (by C-G formula based on SCr of 1.33 mg/dL (H)). Liver Function Tests: Recent Labs  Lab 02/27/23 1527  AST 18  ALT 13  ALKPHOS 82  BILITOT 0.4  PROT 7.6  ALBUMIN 3.5   Recent Labs  Lab 02/27/23 1527  LIPASE 41   No results for input(s): "AMMONIA" in the last 168 hours. Coagulation Profile: No results for input(s): "INR", "PROTIME" in the last 168  hours. Cardiac Enzymes: No results for input(s): "CKTOTAL", "CKMB", "CKMBINDEX", "TROPONINI" in the last 168 hours. BNP (last 3 results) No results for input(s): "PROBNP" in  the last 8760 hours. HbA1C: No results for input(s): "HGBA1C" in the last 72 hours. CBG: No results for input(s): "GLUCAP" in the last 168 hours. Lipid Profile: No results for input(s): "CHOL", "HDL", "LDLCALC", "TRIG", "CHOLHDL", "LDLDIRECT" in the last 72 hours. Thyroid Function Tests: No results for input(s): "TSH", "T4TOTAL", "FREET4", "T3FREE", "THYROIDAB" in the last 72 hours. Anemia Panel: No results for input(s): "VITAMINB12", "FOLATE", "FERRITIN", "TIBC", "IRON", "RETICCTPCT" in the last 72 hours. Urine analysis:    Component Value Date/Time   COLORURINE STRAW (A) 02/27/2023 2150   APPEARANCEUR CLEAR 02/27/2023 2150   LABSPEC 1.013 02/27/2023 2150   PHURINE 6.0 02/27/2023 2150   GLUCOSEU NEGATIVE 02/27/2023 2150   HGBUR NEGATIVE 02/27/2023 2150   BILIRUBINUR NEGATIVE 02/27/2023 2150   KETONESUR NEGATIVE 02/27/2023 2150   PROTEINUR NEGATIVE 02/27/2023 2150   UROBILINOGEN 0.2 06/04/2013 1924   NITRITE NEGATIVE 02/27/2023 2150   LEUKOCYTESUR TRACE (A) 02/27/2023 2150    Radiological Exams on Admission: CT Angio Chest PE W and/or Wo Contrast  Result Date: 02/27/2023 CLINICAL DATA:  Epigastric pain, chest pain, abdominal pain, emesis and shortness of breath. History of DVT. EXAM: CT ANGIOGRAPHY CHEST CT ABDOMEN AND PELVIS WITH CONTRAST TECHNIQUE: Multidetector CT imaging of the chest was performed using the standard protocol during bolus administration of intravenous contrast. Multiplanar CT image reconstructions and MIPs were obtained to evaluate the vascular anatomy. Multidetector CT imaging of the abdomen and pelvis was performed using the standard protocol during bolus administration of intravenous contrast. RADIATION DOSE REDUCTION: This exam was performed according to the departmental dose-optimization  program which includes automated exposure control, adjustment of the mA and/or kV according to patient size and/or use of iterative reconstruction technique. CONTRAST:  60mL OMNIPAQUE IOHEXOL 350 MG/ML SOLN COMPARISON:  CTA chest 05/11/2022 and CT renal stone protocol 05/10/2022 FINDINGS: CTA CHEST FINDINGS Cardiovascular: Sternotomy and CABG. Coronary artery and aortic atherosclerotic calcification. Cardiomegaly. No pericardial effusion. Satisfactory opacification of the pulmonary arteries to the segmental level. No pulmonary embolism. Mediastinum/Nodes: Unremarkable trachea and esophagus. No thoracic adenopathy. Lungs/Pleura: Mild diffuse interlobular septal thickening and patchy ground-glass opacities greatest in the lower lungs, improved compared with 05/11/2022. Bibasilar atelectasis/scarring. No pleural effusion or pneumothorax. Musculoskeletal: No acute fracture. Review of the MIP images confirms the above findings. CT ABDOMEN and PELVIS FINDINGS Hepatobiliary: Unremarkable liver, gallbladder, and biliary tree. Pancreas: Unremarkable. Spleen: Unremarkable. Adrenals/Urinary Tract: Normal adrenal glands. Atrophic native kidneys. Right lower quadrant renal allograft no urinary calculi or hydronephrosis. Unremarkable bladder. Areas of cortical geographic hypoattenuation within the renal transplant suspicious for pyelonephritis. Stomach/Bowel: Normal caliber large and small bowel. Colonic diverticulosis without diverticulitis. Normal appendix. Stomach is within normal limits. Vascular/Lymphatic: Aortic atherosclerosis. No enlarged abdominal or pelvic lymph nodes. Reproductive: Hysterectomy. Other: No free intraperitoneal fluid or air. 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 Superior endplate compression deformity of T10 is presumed chronic. No acute fracture. Review of the MIP images confirms the above findings. IMPRESSION: 1. Negative for acute pulmonary  embolism. 2. Findings suggestive of congestive heart failure with mild pulmonary edema. 3. Areas of geographic hypoattenuation within the right lower quadrant renal transplant suspicious for pyelonephritis. 4. 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 Electronically Signed   By: Minerva Fester M.D.   On: 02/27/2023 21:46   CT ABDOMEN PELVIS W CONTRAST  Result Date: 02/27/2023 CLINICAL DATA:  Epigastric pain, chest pain, abdominal pain, emesis and shortness of breath. History of DVT. EXAM: CT ANGIOGRAPHY  CHEST CT ABDOMEN AND PELVIS WITH CONTRAST TECHNIQUE: Multidetector CT imaging of the chest was performed using the standard protocol during bolus administration of intravenous contrast. Multiplanar CT image reconstructions and MIPs were obtained to evaluate the vascular anatomy. Multidetector CT imaging of the abdomen and pelvis was performed using the standard protocol during bolus administration of intravenous contrast. RADIATION DOSE REDUCTION: This exam was performed according to the departmental dose-optimization program which includes automated exposure control, adjustment of the mA and/or kV according to patient size and/or use of iterative reconstruction technique. CONTRAST:  60mL OMNIPAQUE IOHEXOL 350 MG/ML SOLN COMPARISON:  CTA chest 05/11/2022 and CT renal stone protocol 05/10/2022 FINDINGS: CTA CHEST FINDINGS Cardiovascular: Sternotomy and CABG. Coronary artery and aortic atherosclerotic calcification. Cardiomegaly. No pericardial effusion. Satisfactory opacification of the pulmonary arteries to the segmental level. No pulmonary embolism. Mediastinum/Nodes: Unremarkable trachea and esophagus. No thoracic adenopathy. Lungs/Pleura: Mild diffuse interlobular septal thickening and patchy ground-glass opacities greatest in the lower lungs, improved compared with 05/11/2022. Bibasilar atelectasis/scarring. No pleural effusion or pneumothorax. Musculoskeletal: No acute  fracture. Review of the MIP images confirms the above findings. CT ABDOMEN and PELVIS FINDINGS Hepatobiliary: Unremarkable liver, gallbladder, and biliary tree. Pancreas: Unremarkable. Spleen: Unremarkable. Adrenals/Urinary Tract: Normal adrenal glands. Atrophic native kidneys. Right lower quadrant renal allograft no urinary calculi or hydronephrosis. Unremarkable bladder. Areas of cortical geographic hypoattenuation within the renal transplant suspicious for pyelonephritis. Stomach/Bowel: Normal caliber large and small bowel. Colonic diverticulosis without diverticulitis. Normal appendix. Stomach is within normal limits. Vascular/Lymphatic: Aortic atherosclerosis. No enlarged abdominal or pelvic lymph nodes. Reproductive: Hysterectomy. Other: No free intraperitoneal fluid or air. 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 Superior endplate compression deformity of T10 is presumed chronic. No acute fracture. Review of the MIP images confirms the above findings. IMPRESSION: 1. Negative for acute pulmonary embolism. 2. Findings suggestive of congestive heart failure with mild pulmonary edema. 3. Areas of geographic hypoattenuation within the right lower quadrant renal transplant suspicious for pyelonephritis. 4. 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 Electronically Signed   By: Minerva Fester M.D.   On: 02/27/2023 21:46   VAS [place] LOWER EXTREMITY VENOUS (DVT) (ONLY [facility] & [facility])  Result Date: 02/27/2023  Lower Venous DVT Study Patient Name:  RYIAN LADAGE [facility]  Date of Exam:   02/27/2023 Medical Rec #: [id number]        Accession #:    [id number] Date of Birth: Oct 23, 1960        Patient Gender: F Patient Age:   9 years Exam Location:  [facility] Procedure:      VAS [place] LOWER EXTREMITY VENOUS (DVT) Referring Phys: Lorenda Cahill HENDERLY --------------------------------------------------------------------------------  Indications:  Swelling, and Patient had ankle surgery December 2023. Bedrest until February. Placed in cam walker in February, just released from wearing cam walker within the last month.  Limitations: Pain with compression. Unable to turn lights off in room. Comparison Study: Prior right LEV done at [facility] 09/10/2022. Negative for                   DVT. Performing Technologist: Sherren Kerns RVS  Examination Guidelines: A complete evaluation includes B-mode imaging, spectral Doppler, color Doppler, and power Doppler as needed of all accessible portions of each vessel. Bilateral testing is considered an integral part of a complete examination. Limited examinations for reoccurring indications may be performed as noted. The reflux portion of the exam is performed with the patient in  reverse Trendelenburg.  +---------+---------------+---------+-----------+----------+-------------------+ RIGHT    CompressibilityPhasicitySpontaneityPropertiesThrombus Aging      +---------+---------------+---------+-----------+----------+-------------------+ CFV      Full           Yes      Yes                                      +---------+---------------+---------+-----------+----------+-------------------+ SFJ      Full                                                             +---------+---------------+---------+-----------+----------+-------------------+ FV Prox  Full           Yes                                               +---------+---------------+---------+-----------+----------+-------------------+ FV Mid   Full                                                             +---------+---------------+---------+-----------+----------+-------------------+ FV DistalFull           Yes      Yes                                      +---------+---------------+---------+-----------+----------+-------------------+ PFV      Full           Yes      Yes                                       +---------+---------------+---------+-----------+----------+-------------------+ POP      Full           Yes      Yes                                      +---------+---------------+---------+-----------+----------+-------------------+ PTV      Full                                                             +---------+---------------+---------+-----------+----------+-------------------+ PERO                                                  Not well visualized +---------+---------------+---------+-----------+----------+-------------------+ Gastroc                 Yes  Yes                                      +---------+---------------+---------+-----------+----------+-------------------+   +----+---------------+---------+-----------+----------+--------------+ LEFTCompressibilityPhasicitySpontaneityPropertiesThrombus Aging +----+---------------+---------+-----------+----------+--------------+ CFV Full           Yes      Yes                                 +----+---------------+---------+-----------+----------+--------------+     Summary: RIGHT: - There is no evidence of deep vein thrombosis in the lower extremity.  - No cystic structure found in the popliteal fossa.  LEFT: - No evidence of common femoral vein obstruction.  *See table(s) above for measurements and observations.    Preliminary    DG Chest Portable 1 View  Result Date: 02/27/2023 CLINICAL DATA:  Shortness of breath. EXAM: PORTABLE CHEST 1 VIEW COMPARISON:  05/10/2022 FINDINGS: The cardio pericardial silhouette is enlarged. The lungs are clear without focal pneumonia, edema, pneumothorax or pleural effusion. Subtle atelectasis or scarring noted at the left base. Vascular stent device noted in the left axillary region. No acute bony abnormality. Telemetry leads overlie the chest. IMPRESSION: No acute cardiopulmonary findings. Subtle atelectasis or scarring is noted at the left base. Electronically Signed    By: Kennith Center M.D.   On: 02/27/2023 17:59    EKG: Independently reviewed.   Assessment and Plan: * Chest pain, rule out acute myocardial infarction UA vs CHF onset.  Pt with all the risk factors including CABG 1 year ago. Cards seeing in consult Hold eliquis NPO Heparin gtt Plan for stress test in AM Will also put in for 2d echo since we can't see one done at office on Fri Tele monitor Hold home torsemide for the moment given the very mild AKI Defer to cards wether we should give or hold diuretics at the moment given slight BNP elevation + pulm edema findings on CT chest Cont ASA   Kidney transplant status, cadaveric Cont anti rejection meds Recent pyelonephritis, still seen on CT, but UA today very negative. Doubt active acute rejection given negative UA Mild AKI of renal transplant (creat 1.33 up from 1.2 baseline it looks like). Got IVF in ED Repeat BMP in AM Hold nephrotoxic meds Consult nephrology in AM if creat not improving Cont MWF bactrim Cont prednisone 5mg  daily for now (BP 170s in ED, will avoid stress dose for now).  Pulmonary fibrosis, postinflammatory (HCC) PIPF of RLL and intermittent use of home O2 at baseline following COVID-19 back in 2020.  Not on O2 at all times though.  History of cardiac arrest 1st arrest was back when she was on dialysis (looks like Oct 2013 or earlier?). 2nd arrest was due to hypotension / hypoxia in Dec 2023 during admit for broken ankle.  Hyperlipidemia Cont Statin  Diabetes mellitus, type II, insulin dependent (HCC) Reduce lantus to 10u (from 15 at home) Hold semglutide With sensitive SSI Q4H  Essential hypertension: Associated with renal disease Med rec pending, cont home BP meds      Advance Care Planning:   Code Status: Full Code  Consults: Dr. Welton Flakes with cards going to consult  Family Communication: No family in room  Severity of Illness: The appropriate patient status for this patient is OBSERVATION.  Observation status is judged to be reasonable and necessary in order to provide the required  intensity of service to ensure the patient's safety. The patient's presenting symptoms, physical exam findings, and initial radiographic and laboratory data in the context of their medical condition is felt to place them at decreased risk for further clinical deterioration. Furthermore, it is anticipated that the patient will be medically stable for discharge from the hospital within 2 midnights of admission.   Author: Hillary Bow., DO 02/28/2023 1:01 AM  For on call review [email or web address].

## 2023-02-28 NOTE — Plan of Care (Signed)

## 2023-02-28 NOTE — Assessment & Plan Note (Addendum)
Cont anti rejection meds Recent pyelonephritis, still seen on CT, but UA today very negative. Doubt active acute rejection given negative UA Mild AKI of renal transplant (creat 1.33 up from 1.2 baseline it looks like). Got IVF in ED Repeat BMP in AM Hold nephrotoxic meds Consult nephrology in AM if creat not improving Cont MWF bactrim Cont prednisone 5mg  daily for now (BP 170s in ED, will avoid stress dose for now).

## 2023-02-28 NOTE — Progress Notes (Signed)
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  Patient was also recently diagnosed with outpatient UTI/pyelonephritis on antibiotics.  Upon admission there was concerns of ACS especially in the setting of recent CABG.  Cardiology team consulted.   Assessment & Plan:  Principal Problem:   Chest pain, rule out acute myocardial infarction Active Problems:   Kidney transplant status, cadaveric   Essential hypertension: Associated with renal disease   Diabetes mellitus, type II, insulin dependent (HCC)   Hyperlipidemia   History of cardiac arrest   Pulmonary fibrosis, postinflammatory (HCC)     Assessment and Plan: * Chest pain, rule out acute myocardial infarction CAD status post CABG June 2023 Troponins mildly elevated 216 > 228.  Patient already on outpatient Eliquis which is currently on hold and placed on heparin drip.  Echocardiogram shows EF 65%, grade 2 DD. CTA neg for PE.    Kidney transplant status, Oct 2019 UTI/Pyelonephritis. POA.  -Follows at [facility]. Continue outpatient tacrolimus, mycophenolate and prednisone.  Bactrim Monday Wednesday Friday -Recently diagnosed outpatient UTI on Omnicef  Pulmonary fibrosis, postinflammatory (HCC) PIPF of RLL and intermittent use of home O2 at baseline following COVID-19 back in 2020.  Not on O2 at all times though.  History of right lower extremity DVT - On Eliquis  History of cardiac arrest 1st arrest was back when she was on dialysis (looks like Oct 2013 or earlier?). 2nd arrest was due to  hypotension / hypoxia in Dec 2023 during admit for broken ankle.  Hyperlipidemia Cont Statin  Diabetes mellitus, type II, insulin dependent (HCC) Peripheral Neuropathy.  -A1c 7.7. -Semglee 10 units daily.  Sliding scale and Accu-Chek.  Adjust as necessary  Essential hypertension: Associated with renal disease Currently on Coreg, Procardia.  IV as needed  Hypokalemia - As needed repletion  Hypothyroidism - Synthroid      DVT prophylaxis: on Hep Drip Code Status: Full Family Communication:   Still feels quite weak.  Continue hospital stay until cleared by cardiology.       Diet Orders (From admission, onward)     Start     Ordered   02/28/23 0034  Diet NPO time specified Except for: Sips with Meds  Diet effective now       Question:  Except for  Answer:  Clearance Coots with Meds   02/28/23 0033            Subjective: Chest pain subsided   Examination:  General exam: Appears calm and comfortable  Respiratory system: Clear to auscultation. Respiratory effort normal. Cardiovascular system: S1 & S2 heard, RRR. No JVD, murmurs, rubs, gallops or clicks. No pedal edema. Gastrointestinal system: Abdomen is nondistended, soft and nontender. No organomegaly or masses felt. Normal bowel sounds heard. Central nervous system: Alert and oriented. No focal neurological deficits. Extremities: Symmetric 5 x 5 power. Skin: No rashes, lesions or ulcers Psychiatry: Judgement and insight appear normal. Mood & affect appropriate.  Objective: Vitals:   02/28/23 0230 02/28/23 0351 02/28/23 0413 02/28/23 0700  BP: (!) 163/77 (!) 160/68  (!) 152/65  Pulse: 80  79  72  Resp: 15 19  16   Temp:   98 F (36.7 C)   TempSrc:   Oral   SpO2: 92% 94%  93%  Weight:      Height:        Intake/Output Summary (Last 24 hours) at 02/28/2023 0839 Last data filed at 02/28/2023 0133 Gross per 24 hour  Intake 1100 ml  Output --  Net 1100 ml   Filed Weights   02/27/23 1439  Weight: 84.8 kg     Scheduled Meds:  aspirin  81 mg Oral Daily   atorvastatin  20 mg Oral Daily   carvedilol  25 mg Oral BID WC   cefdinir  300 mg Oral BID   cholecalciferol  2,000 Units Oral Daily   gabapentin  300 mg Oral TID   insulin aspart  0-9 Units Subcutaneous Q4H   insulin glargine-yfgn  10 Units Subcutaneous QHS   levothyroxine  100 mcg Oral Q0600   linaclotide  290 mcg Oral QAC breakfast   mycophenolate  360 mg Oral BID   NIFEdipine  60 mg Oral BID   predniSONE  5 mg Oral Daily   sodium chloride flush  3 mL Intravenous Q12H   sulfamethoxazole-trimethoprim  1 tablet Oral Q M,W,F   tacrolimus ER  6 mg Oral Daily   Continuous Infusions:  sodium chloride     heparin 1,100 Units/hr (02/28/23 0133)    Nutritional status     Body mass index is 34.2 kg/m.  Data Reviewed:   CBC: Recent Labs  Lab 02/27/23 1527 02/28/23 0115  WBC 4.5 4.0  NEUTROABS 2.9  --   HGB 12.7 11.1*  HCT 41.7 36.3  MCV 69.3* 68.5*  PLT 193 180   Basic Metabolic Panel: Recent Labs  Lab 02/27/23 1527  NA 135  K 3.4*  CL 97*  CO2 23  GLUCOSE 217*  BUN 33*  CREATININE 1.33*  CALCIUM 9.5   GFR: Estimated Creatinine Clearance: 44.3 mL/min (A) (by C-G formula based on SCr of 1.33 mg/dL (H)). Liver Function Tests: Recent Labs  Lab 02/27/23 1527  AST 18  ALT 13  ALKPHOS 82  BILITOT 0.4  PROT 7.6  ALBUMIN 3.5   Recent Labs  Lab 02/27/23 1527  LIPASE 41   No results for input(s): "AMMONIA" in the last 168 hours. Coagulation Profile: No results for input(s): "INR", "PROTIME" in the last 168 hours. Cardiac Enzymes: No results for input(s): "CKTOTAL", "CKMB", "CKMBINDEX", "TROPONINI" in the last 168 hours. BNP (last 3 results) No results for input(s): "PROBNP" in the last 8760 hours. HbA1C: Recent Labs    02/28/23 0115  HGBA1C 7.7*   CBG: Recent Labs  Lab 02/28/23 0157 02/28/23 0406  GLUCAP 112* 108*   Lipid Profile: No results for input(s): "CHOL", "HDL", "LDLCALC", "TRIG",  "CHOLHDL", "LDLDIRECT" in the last 72 hours. Thyroid Function Tests: No results for input(s): "TSH", "T4TOTAL", "FREET4", "T3FREE", "THYROIDAB" in the last 72 hours. Anemia Panel: No results for input(s): "VITAMINB12", "FOLATE", "FERRITIN", "TIBC", "IRON", "RETICCTPCT" in the last 72 hours. Sepsis Labs: No results for input(s): "PROCALCITON", "LATICACIDVEN" in the last 168 hours.  Recent Results (from the past 240 hour(s))  SARS Coronavirus 2 by RT PCR (hospital order, performed in [facility] hospital lab) *cepheid single result test* Anterior Nasal Swab     Status: None   Collection Time: 02/27/23  7:16 PM   Specimen: Anterior Nasal Swab  Result Value Ref Range Status   SARS Coronavirus 2 by RT  PCR NEGATIVE NEGATIVE Final    Comment: Performed at [facility], [address], [place], [place] [address]         Radiology Studies: CT Angio Chest PE W and/or Wo Contrast  Result Date: 02/27/2023 CLINICAL DATA:  Epigastric pain, chest pain, abdominal pain, emesis and shortness of breath. History of DVT. EXAM: CT ANGIOGRAPHY CHEST CT ABDOMEN AND PELVIS WITH CONTRAST TECHNIQUE: Multidetector CT imaging of the chest was performed using the standard protocol during bolus administration of intravenous contrast. Multiplanar CT image reconstructions and MIPs were obtained to evaluate the vascular anatomy. Multidetector CT imaging of the abdomen and pelvis was performed using the standard protocol during bolus administration of intravenous contrast. RADIATION DOSE REDUCTION: This exam was performed according to the departmental dose-optimization program which includes automated exposure control, adjustment of the mA and/or kV according to patient size and/or use of iterative reconstruction technique. CONTRAST:  60mL OMNIPAQUE IOHEXOL 350 MG/ML SOLN COMPARISON:  CTA chest 05/11/2022 and CT renal stone protocol 05/10/2022 FINDINGS: CTA CHEST FINDINGS Cardiovascular: Sternotomy and CABG. Coronary  artery and aortic atherosclerotic calcification. Cardiomegaly. No pericardial effusion. Satisfactory opacification of the pulmonary arteries to the segmental level. No pulmonary embolism. Mediastinum/Nodes: Unremarkable trachea and esophagus. No thoracic adenopathy. Lungs/Pleura: Mild diffuse interlobular septal thickening and patchy ground-glass opacities greatest in the lower lungs, improved compared with 05/11/2022. Bibasilar atelectasis/scarring. No pleural effusion or pneumothorax. Musculoskeletal: No acute fracture. Review of the MIP images confirms the above findings. CT ABDOMEN and PELVIS FINDINGS Hepatobiliary: Unremarkable liver, gallbladder, and biliary tree. Pancreas: Unremarkable. Spleen: Unremarkable. Adrenals/Urinary Tract: Normal adrenal glands. Atrophic native kidneys. Right lower quadrant renal allograft no urinary calculi or hydronephrosis. Unremarkable bladder. Areas of cortical geographic hypoattenuation within the renal transplant suspicious for pyelonephritis. Stomach/Bowel: Normal caliber large and small bowel. Colonic diverticulosis without diverticulitis. Normal appendix. Stomach is within normal limits. Vascular/Lymphatic: Aortic atherosclerosis. No enlarged abdominal or pelvic lymph nodes. Reproductive: Hysterectomy. Other: No free intraperitoneal fluid or air. 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 Superior endplate compression deformity of T10 is presumed chronic. No acute fracture. Review of the MIP images confirms the above findings. IMPRESSION: 1. Negative for acute pulmonary embolism. 2. Findings suggestive of congestive heart failure with mild pulmonary edema. 3. Areas of geographic hypoattenuation within the right lower quadrant renal transplant suspicious for pyelonephritis. 4. 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 Electronically Signed   By: Minerva Fester M.D.   On: 02/27/2023  21:46   CT ABDOMEN PELVIS W CONTRAST  Result Date: 02/27/2023 CLINICAL DATA:  Epigastric pain, chest pain, abdominal pain, emesis and shortness of breath. History of DVT. EXAM: CT ANGIOGRAPHY CHEST CT ABDOMEN AND PELVIS WITH CONTRAST TECHNIQUE: Multidetector CT imaging of the chest was performed using the standard protocol during bolus administration of intravenous contrast. Multiplanar CT image reconstructions and MIPs were obtained to evaluate the vascular anatomy. Multidetector CT imaging of the abdomen and pelvis was performed using the standard protocol during bolus administration of intravenous contrast. RADIATION DOSE REDUCTION: This exam was performed according to the departmental dose-optimization program which includes automated exposure control, adjustment of the mA and/or kV according to patient size and/or use of iterative reconstruction technique. CONTRAST:  60mL OMNIPAQUE IOHEXOL 350 MG/ML SOLN COMPARISON:  CTA chest 05/11/2022 and CT renal stone protocol 05/10/2022 FINDINGS: CTA CHEST FINDINGS Cardiovascular: Sternotomy and CABG. Coronary artery and aortic atherosclerotic calcification. Cardiomegaly. No pericardial effusion. Satisfactory opacification of the pulmonary  arteries to the segmental level. No pulmonary embolism. Mediastinum/Nodes: Unremarkable trachea and esophagus. No thoracic adenopathy. Lungs/Pleura: Mild diffuse interlobular septal thickening and patchy ground-glass opacities greatest in the lower lungs, improved compared with 05/11/2022. Bibasilar atelectasis/scarring. No pleural effusion or pneumothorax. Musculoskeletal: No acute fracture. Review of the MIP images confirms the above findings. CT ABDOMEN and PELVIS FINDINGS Hepatobiliary: Unremarkable liver, gallbladder, and biliary tree. Pancreas: Unremarkable. Spleen: Unremarkable. Adrenals/Urinary Tract: Normal adrenal glands. Atrophic native kidneys. Right lower quadrant renal allograft no urinary calculi or hydronephrosis.  Unremarkable bladder. Areas of cortical geographic hypoattenuation within the renal transplant suspicious for pyelonephritis. Stomach/Bowel: Normal caliber large and small bowel. Colonic diverticulosis without diverticulitis. Normal appendix. Stomach is within normal limits. Vascular/Lymphatic: Aortic atherosclerosis. No enlarged abdominal or pelvic lymph nodes. Reproductive: Hysterectomy. Other: No free intraperitoneal fluid or air. 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 Superior endplate compression deformity of T10 is presumed chronic. No acute fracture. Review of the MIP images confirms the above findings. IMPRESSION: 1. Negative for acute pulmonary embolism. 2. Findings suggestive of congestive heart failure with mild pulmonary edema. 3. Areas of geographic hypoattenuation within the right lower quadrant renal transplant suspicious for pyelonephritis. 4. 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 Electronically Signed   By: Minerva Fester M.D.   On: 02/27/2023 21:46   VAS [place] LOWER EXTREMITY VENOUS (DVT) (ONLY [facility] & [facility])  Result Date: 02/27/2023  Lower Venous DVT Study Patient Name:  [name] [facility]  Date of Exam:   02/27/2023 Medical Rec #: [id number]        Accession #:    [id number] Date of Birth: 03/27/1961        Patient Gender: F Patient Age:   48 years Exam Location:  [facility] Procedure:      VAS [place] LOWER EXTREMITY VENOUS (DVT) Referring Phys: Lorenda Cahill HENDERLY --------------------------------------------------------------------------------  Indications: Swelling, and Patient had ankle surgery December 2023. Bedrest until February. Placed in cam walker in February, just released from wearing cam walker within the last month.  Limitations: Pain with compression. Unable to turn lights off in room. Comparison Study: Prior right LEV done at [facility] 09/10/2022. Negative for                   DVT. Performing  Technologist: Sherren Kerns RVS  Examination Guidelines: A complete evaluation includes B-mode imaging, spectral Doppler, color Doppler, and power Doppler as needed of all accessible portions of each vessel. Bilateral testing is considered an integral part of a complete examination. Limited examinations for reoccurring indications may be performed as noted. The reflux portion of the exam is performed with the patient in reverse Trendelenburg.  +---------+---------------+---------+-----------+----------+-------------------+ RIGHT    CompressibilityPhasicitySpontaneityPropertiesThrombus Aging      +---------+---------------+---------+-----------+----------+-------------------+ CFV      Full           Yes      Yes                                      +---------+---------------+---------+-----------+----------+-------------------+ SFJ      Full                                                             +---------+---------------+---------+-----------+----------+-------------------+  FV Prox  Full           Yes                                               +---------+---------------+---------+-----------+----------+-------------------+ FV Mid   Full                                                             +---------+---------------+---------+-----------+----------+-------------------+ FV DistalFull           Yes      Yes                                      +---------+---------------+---------+-----------+----------+-------------------+ PFV      Full           Yes      Yes                                      +---------+---------------+---------+-----------+----------+-------------------+ POP      Full           Yes      Yes                                      +---------+---------------+---------+-----------+----------+-------------------+ PTV      Full                                                              +---------+---------------+---------+-----------+----------+-------------------+ PERO                                                  Not well visualized +---------+---------------+---------+-----------+----------+-------------------+ Gastroc                 Yes      Yes                                      +---------+---------------+---------+-----------+----------+-------------------+   +----+---------------+---------+-----------+----------+--------------+ LEFTCompressibilityPhasicitySpontaneityPropertiesThrombus Aging +----+---------------+---------+-----------+----------+--------------+ CFV Full           Yes      Yes                                 +----+---------------+---------+-----------+----------+--------------+     Summary: RIGHT: - There is no evidence of deep vein thrombosis in the lower extremity.  - No cystic structure found in the popliteal fossa.  LEFT: - No evidence of common femoral vein obstruction.  *See table(s) above for measurements and observations.  Preliminary    DG Chest Portable 1 View  Result Date: 02/27/2023 CLINICAL DATA:  Shortness of breath. EXAM: PORTABLE CHEST 1 VIEW COMPARISON:  05/10/2022 FINDINGS: The cardio pericardial silhouette is enlarged. The lungs are clear without focal pneumonia, edema, pneumothorax or pleural effusion. Subtle atelectasis or scarring noted at the left base. Vascular stent device noted in the left axillary region. No acute bony abnormality. Telemetry leads overlie the chest. IMPRESSION: No acute cardiopulmonary findings. Subtle atelectasis or scarring is noted at the left base. Electronically Signed   By: Kennith Center M.D.   On: 02/27/2023 17:59           LOS: 0 days   Time spent= 35 mins    Ahmere Hemenway Joline Maxcy, MD [facility]  If 7PM-7AM, please contact night-coverage  02/28/2023, 8:39 AM

## 2023-02-28 NOTE — Assessment & Plan Note (Addendum)
Reduce lantus to 10u (from 15 at home) Hold semglutide With sensitive SSI Q4H

## 2023-02-28 NOTE — Consult Note (Signed)
Cardiology Consultation   Patient ID: LOTTA AUKER MRN: [id number]; DOB: 1961-07-05  Admit date: 02/27/2023 Date of Consult: 02/28/2023  PCP:  Merlyn Albert, FNP   [facility] Providers Cardiologist:  None        Patient Profile:   [name] is a 62 y.o. 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 Julian Reil  History of Present Illness:   [name] is a 62 y.o. 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 Julian Reil. Patient has been struggling with UTI since few weeks. She has taken 2 Abx courses and has had few ED visits at OSH. She was having dysuria and foul smelling urine. Today came to [facility] ED, with similar complaints. Abd pain / flank pain has improved somewhat since starting most recent course of ABx on 6/6. Troponin was elevated to 200s in the ED hence cardiology was consulted. CT showed pyelonephritis and mild HF with mild BNP elevation. She denies any CP- just describes fluttering sometimes which has started since few weeks.    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 On multiple medications.   Gastroparesis due to DM [facility])    GERD (gastroesophageal reflux disease)    Heart murmur    Hemodialysis patient [facility])    Hyperlipidemia    Hypothyroidism (acquired)    Paroxysmal supraventricular tachycardia    Peripheral arterial disease [facility]) December 2014   @ [facility]- Billie Lade: a) RSFA PTA (01/2013); b) L SFA [place] Atherectomy/PTA with 3V runoff; LEA Dopplers January 2016 [facility] post L SFA PTA): Mild, insignificant disease in the left CFA, profunda, SFA, popliteal artery and tibioperoneal trunk.   Sickle cell trait [facility])     Past Surgical History:  Procedure Laterality Date   ABDOMINAL HYSTERECTOMY     ARTERIOVENOUS GRAFT PLACEMENT Left 2012   Dr. Tenny Craw  2nd graft   Carotid ultrasound   May 2011   Normal [facility] [place])   CATARACT EXTRACTION, BILATERAL     COLONOSCOPY  07/19/2012   Procedure: COLONOSCOPY;  Surgeon: Theda Belfast, MD;  Location: [facility] ENDOSCOPY;  Service: Endoscopy;  Laterality: N/A;   FISTULOGRAM Left 06/04/2015   Procedure: FISTULOGRAM;  Surgeon: Fransisco Hertz, MD;  Location: [facility] OR;  Service: Vascular;  Laterality: Left;   KNEE ARTHROPLASTY     KNEE SURGERY     right   NM MYOVIEW LTD  September 2009; 01/15/2015   a) @ [facility] - Negative for inducible or reversible  ischemia with pharmacologic stress. EF 63% ; b) [facility]: EF 68%. No RWMA, negative S4 reversible coronary ischemia or infarction.   PERIPHERAL ANGIOPLASTY  May 2014   PTA of R SFA-PopA.   PERIPHERAL ATHRECTOMY  December 2014   [place] Atherectomy of L SFA-L PopA reduction of 60% to 10% stenosis    PERIPHERAL VASCULAR CATHETERIZATION  May 2014; December 2014   a) RSFA & PopA mod-severe disease -- R SFA PTA, Med Rx of L SFA; b) distal abdominal and iliac & CFA arteries patent.  m-d LSFA long 60%, LPop 60% -> 3 V runoff. RSFA ~40% mid, RPop 60%, 3V runoff   REVISION OF  ARTERIOVENOUS GORETEX GRAFT Left 06/04/2015   Procedure: REVISION OF ARTERIOVENOUS GORETEX GRAFT;  Surgeon: Fransisco Hertz, MD;  Location: [facility] OR;  Service: Vascular;  Laterality: Left;   SHUNTOGRAM Left 02/14/2013   Procedure: FISTULOGRAM;  Surgeon: Nada Libman, MD;  Location: [facility] CATH LAB;  Service: Cardiovascular;  Laterality: Left;   THYROID SURGERY     THYROIDECTOMY     TRANSTHORACIC ECHOCARDIOGRAM  October 2013; January 2015   a) [facility]) 2013: normal LV size and function. Mild LVH. EF 55-60%. no RWMA, No valve disease; b) [facility] 1/'15: EF 70-75%, mild MR. Normal diastolic dysfunction       Inpatient Medications: Scheduled Meds:  aspirin  81 mg Oral Daily   atorvastatin  20 mg Oral Daily   carvedilol  25 mg Oral BID WC   cefdinir  300 mg Oral BID   cholecalciferol  2,000 Units Oral Daily   gabapentin  300 mg Oral TID   insulin aspart  0-9 Units Subcutaneous Q4H   insulin glargine-yfgn  10 Units Subcutaneous QHS   levothyroxine  100 mcg Oral Q0600   linaclotide  290 mcg Oral QAC breakfast   mycophenolate  360 mg Oral BID   NIFEdipine  60 mg Oral BID   predniSONE  5 mg Oral Daily   sodium chloride flush  3 mL Intravenous Q12H   sulfamethoxazole-trimethoprim  1 tablet Oral Q M,W,F   tacrolimus ER  6 mg Oral Daily   Continuous Infusions:  sodium chloride     heparin     PRN Meds: sodium chloride, acetaminophen, albuterol, cyclobenzaprine, famotidine, ondansetron (ZOFRAN) IV, oxyCODONE-acetaminophen, sodium chloride flush  Allergies:    Allergies  Allergen Reactions   Lisinopril Swelling    Swelling of tongue   Hydralazine     Other reaction(s): heart palpitations    Social History:   Social History   Socioeconomic History   Marital status: Divorced    Spouse name: Not on file   Number of children: 3   Years of education: Not on file   Highest education level: Not on file  Occupational History   Not on file  Tobacco Use   Smoking status: Former     Types: Cigarettes    Quit date: 09/20/1993    Years since quitting: 29.4   Smokeless tobacco: Never  Vaping Use   Vaping Use: Never used  Substance and Sexual Activity   Alcohol use: No    Alcohol/week: 0.0 standard drinks of alcohol   Drug use: Yes    Types: Marijuana    Comment: abused drugs in the past   Sexual activity: Never  Other Topics Concern   Not on file  Social History Narrative   Drinks tea.   Former smoker   Does not drink alcohol    has 9  grandchildren. Lives alone. Is currently disabled. She does walk routinely.       Social Determinants of Health   Financial Resource Strain: Not on file  Food Insecurity: Not on file  Transportation Needs: Not on file  Physical Activity: Not on file  Stress: Not on file  Social Connections: Not on file  Intimate Partner Violence: Not on file    Family History:   Family History  Problem Relation Age of Onset   Diabetes Mother    Hypertension Mother    Heart attack Mother    Diabetes Father    Hypertension Father      ROS:  Please see the history of present illness.  All other ROS reviewed and negative.     Physical Exam/Data:   Vitals:   02/27/23 2130 02/27/23 2145 02/27/23 2200 02/28/23 0000  BP:  (!) 159/67 (!) 160/70   Pulse: 86 90 80   Resp: 15 (!) 24 12   Temp:    98.2 F (36.8 C)  TempSrc:    Oral  SpO2: 100% 99% 100%   Weight:      Height:        Intake/Output Summary (Last 24 hours) at 02/28/2023 0129 Last data filed at 02/27/2023 2222 Gross per 24 hour  Intake 500 ml  Output --  Net 500 ml      02/27/2023    2:39 PM 05/17/2022    6:20 AM 05/16/2022    4:19 AM  Last 3 Weights  Weight (lbs) 187 lb 187 lb 3.2 oz 188 lb 4.8 oz  Weight (kg) 84.823 kg 84.913 kg 85.412 kg     Body mass index is 34.2 kg/m.  General:  Well nourished, well developed, in no acute distress HEENT: normal Neck: no JVD Vascular: No carotid bruits; Distal pulses 2+ bilaterally Cardiac:  normal S1, S2; RRR; no  murmur  Lungs:  clear to auscultation bilaterally, no wheezing, rhonchi or rales  Abd: soft, nontender, no hepatomegaly  Ext: no edema Musculoskeletal:  No deformities, BUE and BLE strength normal and equal Skin: warm and dry  Neuro:  CNs 2-12 intact, no focal abnormalities noted Psych:  Normal affect   EKG:  The EKG was personally reviewed and demonstrates:  no ST elevation  Laboratory Data:  High Sensitivity Troponin:   Recent Labs  Lab 02/27/23 1900 02/27/23 2150 02/27/23 2300  TROPONINIHS 242* 216* 220*     Chemistry Recent Labs  Lab 02/27/23 1527  NA 135  K 3.4*  CL 97*  CO2 23  GLUCOSE 217*  BUN 33*  CREATININE 1.33*  CALCIUM 9.5  GFRNONAA 45*  ANIONGAP 15    Recent Labs  Lab 02/27/23 1527  PROT 7.6  ALBUMIN 3.5  AST 18  ALT 13  ALKPHOS 82  BILITOT 0.4   Lipids No results for input(s): "CHOL", "TRIG", "HDL", "LABVLDL", "LDLCALC", "CHOLHDL" in the last 168 hours.  Hematology Recent Labs  Lab 02/27/23 1527  WBC 4.5  RBC 6.02*  HGB 12.7  HCT 41.7  MCV 69.3*  MCH 21.1*  MCHC 30.5  RDW 19.3*  PLT 193   Thyroid No results for input(s): "TSH", "FREET4" in the last 168 hours.  BNP Recent Labs  Lab 02/27/23 1527  BNP 195.0*    DDimer No results for input(s): "DDIMER" in the last 168 hours.   Radiology/Studies:  CT Angio Chest PE W and/or Wo Contrast  Result Date: 02/27/2023 CLINICAL DATA:  Epigastric pain, chest pain, abdominal pain, emesis and shortness  of breath. History of DVT. EXAM: CT ANGIOGRAPHY CHEST CT ABDOMEN AND PELVIS WITH CONTRAST TECHNIQUE: Multidetector CT imaging of the chest was performed using the standard protocol during bolus administration of intravenous contrast. Multiplanar CT image reconstructions and MIPs were obtained to evaluate the vascular anatomy. Multidetector CT imaging of the abdomen and pelvis was performed using the standard protocol during bolus administration of intravenous contrast. RADIATION DOSE REDUCTION:  This exam was performed according to the departmental dose-optimization program which includes automated exposure control, adjustment of the mA and/or kV according to patient size and/or use of iterative reconstruction technique. CONTRAST:  60mL OMNIPAQUE IOHEXOL 350 MG/ML SOLN COMPARISON:  CTA chest 05/11/2022 and CT renal stone protocol 05/10/2022 FINDINGS: CTA CHEST FINDINGS Cardiovascular: Sternotomy and CABG. Coronary artery and aortic atherosclerotic calcification. Cardiomegaly. No pericardial effusion. Satisfactory opacification of the pulmonary arteries to the segmental level. No pulmonary embolism. Mediastinum/Nodes: Unremarkable trachea and esophagus. No thoracic adenopathy. Lungs/Pleura: Mild diffuse interlobular septal thickening and patchy ground-glass opacities greatest in the lower lungs, improved compared with 05/11/2022. Bibasilar atelectasis/scarring. No pleural effusion or pneumothorax. Musculoskeletal: No acute fracture. Review of the MIP images confirms the above findings. CT ABDOMEN and PELVIS FINDINGS Hepatobiliary: Unremarkable liver, gallbladder, and biliary tree. Pancreas: Unremarkable. Spleen: Unremarkable. Adrenals/Urinary Tract: Normal adrenal glands. Atrophic native kidneys. Right lower quadrant renal allograft no urinary calculi or hydronephrosis. Unremarkable bladder. Areas of cortical geographic hypoattenuation within the renal transplant suspicious for pyelonephritis. Stomach/Bowel: Normal caliber large and small bowel. Colonic diverticulosis without diverticulitis. Normal appendix. Stomach is within normal limits. Vascular/Lymphatic: Aortic atherosclerosis. No enlarged abdominal or pelvic lymph nodes. Reproductive: Hysterectomy. Other: No free intraperitoneal fluid or air. 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 Superior endplate compression deformity of T10 is presumed chronic. No acute fracture. Review of the MIP images  confirms the above findings. IMPRESSION: 1. Negative for acute pulmonary embolism. 2. Findings suggestive of congestive heart failure with mild pulmonary edema. 3. Areas of geographic hypoattenuation within the right lower quadrant renal transplant suspicious for pyelonephritis. 4. 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 Electronically Signed   By: Minerva Fester M.D.   On: 02/27/2023 21:46   CT ABDOMEN PELVIS W CONTRAST  Result Date: 02/27/2023 CLINICAL DATA:  Epigastric pain, chest pain, abdominal pain, emesis and shortness of breath. History of DVT. EXAM: CT ANGIOGRAPHY CHEST CT ABDOMEN AND PELVIS WITH CONTRAST TECHNIQUE: Multidetector CT imaging of the chest was performed using the standard protocol during bolus administration of intravenous contrast. Multiplanar CT image reconstructions and MIPs were obtained to evaluate the vascular anatomy. Multidetector CT imaging of the abdomen and pelvis was performed using the standard protocol during bolus administration of intravenous contrast. RADIATION DOSE REDUCTION: This exam was performed according to the departmental dose-optimization program which includes automated exposure control, adjustment of the mA and/or kV according to patient size and/or use of iterative reconstruction technique. CONTRAST:  60mL OMNIPAQUE IOHEXOL 350 MG/ML SOLN COMPARISON:  CTA chest 05/11/2022 and CT renal stone protocol 05/10/2022 FINDINGS: CTA CHEST FINDINGS Cardiovascular: Sternotomy and CABG. Coronary artery and aortic atherosclerotic calcification. Cardiomegaly. No pericardial effusion. Satisfactory opacification of the pulmonary arteries to the segmental level. No pulmonary embolism. Mediastinum/Nodes: Unremarkable trachea and esophagus. No thoracic adenopathy. Lungs/Pleura: Mild diffuse interlobular septal thickening and patchy ground-glass opacities greatest in the lower lungs, improved compared with 05/11/2022. Bibasilar  atelectasis/scarring. No pleural effusion or pneumothorax. Musculoskeletal: No acute fracture. Review of the MIP images confirms the above  findings. CT ABDOMEN and PELVIS FINDINGS Hepatobiliary: Unremarkable liver, gallbladder, and biliary tree. Pancreas: Unremarkable. Spleen: Unremarkable. Adrenals/Urinary Tract: Normal adrenal glands. Atrophic native kidneys. Right lower quadrant renal allograft no urinary calculi or hydronephrosis. Unremarkable bladder. Areas of cortical geographic hypoattenuation within the renal transplant suspicious for pyelonephritis. Stomach/Bowel: Normal caliber large and small bowel. Colonic diverticulosis without diverticulitis. Normal appendix. Stomach is within normal limits. Vascular/Lymphatic: Aortic atherosclerosis. No enlarged abdominal or pelvic lymph nodes. Reproductive: Hysterectomy. Other: No free intraperitoneal fluid or air. 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 Superior endplate compression deformity of T10 is presumed chronic. No acute fracture. Review of the MIP images confirms the above findings. IMPRESSION: 1. Negative for acute pulmonary embolism. 2. Findings suggestive of congestive heart failure with mild pulmonary edema. 3. Areas of geographic hypoattenuation within the right lower quadrant renal transplant suspicious for pyelonephritis. 4. 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 Electronically Signed   By: Minerva Fester M.D.   On: 02/27/2023 21:46   VAS [place] LOWER EXTREMITY VENOUS (DVT) (ONLY [facility] & [facility])  Result Date: 02/27/2023  Lower Venous DVT Study Patient Name:  [facility]IE [facility]  Date of Exam:   02/27/2023 Medical Rec #: [id number]        Accession #:    [id number] Date of Birth: March 18, 1961        Patient Gender: F Patient Age:   56 years Exam Location:  [facility] Procedure:      VAS [place] LOWER EXTREMITY VENOUS (DVT) Referring Phys: Lorenda Cahill HENDERLY  --------------------------------------------------------------------------------  Indications: Swelling, and Patient had ankle surgery December 2023. Bedrest until February. Placed in cam walker in February, just released from wearing cam walker within the last month.  Limitations: Pain with compression. Unable to turn lights off in room. Comparison Study: Prior right LEV done at [facility] 09/10/2022. Negative for                   DVT. Performing Technologist: Sherren Kerns RVS  Examination Guidelines: A complete evaluation includes B-mode imaging, spectral Doppler, color Doppler, and power Doppler as needed of all accessible portions of each vessel. Bilateral testing is considered an integral part of a complete examination. Limited examinations for reoccurring indications may be performed as noted. The reflux portion of the exam is performed with the patient in reverse Trendelenburg.  +---------+---------------+---------+-----------+----------+-------------------+ RIGHT    CompressibilityPhasicitySpontaneityPropertiesThrombus Aging      +---------+---------------+---------+-----------+----------+-------------------+ CFV      Full           Yes      Yes                                      +---------+---------------+---------+-----------+----------+-------------------+ SFJ      Full                                                             +---------+---------------+---------+-----------+----------+-------------------+ FV Prox  Full           Yes                                               +---------+---------------+---------+-----------+----------+-------------------+  FV Mid   Full                                                             +---------+---------------+---------+-----------+----------+-------------------+ FV DistalFull           Yes      Yes                                       +---------+---------------+---------+-----------+----------+-------------------+ PFV      Full           Yes      Yes                                      +---------+---------------+---------+-----------+----------+-------------------+ POP      Full           Yes      Yes                                      +---------+---------------+---------+-----------+----------+-------------------+ PTV      Full                                                             +---------+---------------+---------+-----------+----------+-------------------+ PERO                                                  Not well visualized +---------+---------------+---------+-----------+----------+-------------------+ Gastroc                 Yes      Yes                                      +---------+---------------+---------+-----------+----------+-------------------+   +----+---------------+---------+-----------+----------+--------------+ LEFTCompressibilityPhasicitySpontaneityPropertiesThrombus Aging +----+---------------+---------+-----------+----------+--------------+ CFV Full           Yes      Yes                                 +----+---------------+---------+-----------+----------+--------------+     Summary: RIGHT: - There is no evidence of deep vein thrombosis in the lower extremity.  - No cystic structure found in the popliteal fossa.  LEFT: - No evidence of common femoral vein obstruction.  *See table(s) above for measurements and observations.    Preliminary    DG Chest Portable 1 View  Result Date: 02/27/2023 CLINICAL DATA:  Shortness of breath. EXAM: PORTABLE CHEST 1 VIEW COMPARISON:  05/10/2022 FINDINGS: The cardio pericardial silhouette is enlarged. The lungs are clear without focal pneumonia, edema, pneumothorax or pleural effusion. Subtle atelectasis or scarring noted at the left base. Vascular stent device noted in  the left axillary region. No acute bony abnormality.  Telemetry leads overlie the chest. IMPRESSION: No acute cardiopulmonary findings. Subtle atelectasis or scarring is noted at the left base. Electronically Signed   By: Kennith Center M.D.   On: 02/27/2023 17:59     Assessment and Plan:   # Elevated Troponin # CAD s/p CABG # Kidney Transplant # Hx of Cardiac arrest # DM2 # HTN # HLD  -Patient coming with abdominal pain concerning for UTI and pyelo -Troponin found to be elevated. Denies current CP-however says has been fluttering feeling in chest since few weeks which co-incides with UTI symptoms -Troponin could be due to demand- however considering significant risk factors may be appropriate to get stress test -NPO after midnight -Stress test in AM -Echo in AM -She is already on apixaban- so will transition that to Heparin -Continue statin -Will hold IV diuretics- does not seem clinically in HF right- esp with AKI and renal transplant.    For questions or updates, please contact [facility] Please consult [email or web address] for contact info under    Signed, Hermelinda Dellen, MD  02/28/2023 1:29 AM

## 2023-02-28 NOTE — Progress Notes (Signed)
ANTICOAGULATION CONSULT NOTE - Initial Consult  Pharmacy Consult for heparin Indication:  elevated troponin and h/o VTE  Allergies  Allergen Reactions   Lisinopril Swelling    Swelling of tongue   Hydralazine     Other reaction(s): heart palpitations    Patient Measurements: Height: 5\' 2"  (157.5 cm) Weight: 84.8 kg (187 lb) IBW/kg (Calculated) : 50.1 Heparin Dosing Weight: 70kg  Vital Signs: Temp: 98.2 F (36.8 C) (06/10 0000) Temp Source: Oral (06/10 0000) BP: 160/70 (06/09 2200) Pulse Rate: 80 (06/09 2200)  Labs: Recent Labs    02/27/23 1527 02/27/23 1900 02/27/23 2150 02/27/23 2300  HGB 12.7  --   --   --   HCT 41.7  --   --   --   PLT 193  --   --   --   CREATININE 1.33*  --   --   --   TROPONINIHS  --  242* 216* 220*    Estimated Creatinine Clearance: 44.3 mL/min (A) (by C-G formula based on SCr of 1.33 mg/dL (H)).   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 On multiple medications.   Gastroparesis due to DM [facility])    GERD (gastroesophageal reflux disease)    Heart murmur    Hemodialysis patient [facility])    Hyperlipidemia    Hypothyroidism (acquired)    Paroxysmal supraventricular tachycardia    Peripheral arterial disease [facility]) December 2014   @ [facility]- Billie Lade: a) RSFA PTA (01/2013); b) L SFA [place] Atherectomy/PTA with 3V runoff; LEA Dopplers January 2016 [facility] post L SFA PTA): Mild, insignificant disease in the left CFA, profunda, SFA, popliteal artery and tibioperoneal trunk.   Sickle cell trait (HCC)     Assessment: 62yo female c/o weakness, SOB, N/V, and worsening  chronic CP, troponin found to be elevated though relatively flat >> to transition from apixaban (h/o VTE) to UFH for possible procedures; last dose of apixaban taken 6/9 8a though she is unsure whether she was able to keep it keep meds down in setting of N/V.  Goal of Therapy:  Heparin level 0.3-0.7 units/ml aPTT 66-102 seconds Monitor platelets by anticoagulation protocol: Yes   Plan:  Start heparin infusion at 1100 units/hr. Monitor heparin levels, aPTT (while DOAC affects anti-Xa), and CBC.  Vernard Gambles, PharmD, BCPS  02/28/2023,12:44 AM

## 2023-02-28 NOTE — Progress Notes (Signed)
ANTICOAGULATION CONSULT NOTE  Pharmacy Consult for heparin Indication:  elevated troponin and h/o VTE  Allergies  Allergen Reactions   Lisinopril Swelling    Swelling of tongue   Hydralazine     Other reaction(s): heart palpitations    Patient Measurements: Height: 5\' 2"  (157.5 cm) Weight: 84.8 kg (187 lb) IBW/kg (Calculated) : 50.1 Heparin Dosing Weight: 70kg  Vital Signs: Temp: 97.5 F (36.4 C) (06/10 1038) Temp Source: Oral (06/10 1038) BP: 145/69 (06/10 1000) Pulse Rate: 65 (06/10 1000)  Labs: Recent Labs    02/27/23 1527 02/27/23 1900 02/27/23 2150 02/27/23 2300 02/28/23 0115 02/28/23 0900 02/28/23 1008  HGB 12.7  --   --   --  11.1*  --   --   HCT 41.7  --   --   --  36.3  --   --   PLT 193  --   --   --  180  --   --   APTT  --   --   --   --   --   --  51*  HEPARINUNFRC  --   --   --   --   --  >1.10*  --   CREATININE 1.33*  --   --   --   --   --   --   TROPONINIHS  --    < > 216* 220* 228*  --   --    < > = values in this interval not displayed.     Estimated Creatinine Clearance: 44.3 mL/min (A) (by C-G formula based on SCr of 1.33 mg/dL (H)).   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 On multiple medications.   Gastroparesis due to DM [facility])    GERD (gastroesophageal reflux disease)    Heart murmur    Hemodialysis patient [facility])    Hyperlipidemia    Hypothyroidism (acquired)    Paroxysmal supraventricular tachycardia    Peripheral arterial disease [facility]) December 2014   @ [facility] Cardiology- Billie Lade: a) RSFA PTA (01/2013); b) L SFA [place] Atherectomy/PTA with 3V runoff; LEA Dopplers  January 2016 [facility] post L SFA PTA): Mild, insignificant disease in the left CFA, profunda, SFA, popliteal artery and tibioperoneal trunk.   Sickle cell trait (HCC)     Assessment: 62yo female c/o weakness, SOB, N/V, and worsening chronic CP, troponin found to be elevated though relatively flat >> to transition from apixaban (h/o VTE) to UFH for possible procedures; last dose of apixaban taken 6/9 8a though she is unsure whether she was able to keep it keep meds down in setting of N/V.  Pharmacy now consulted to resume apixaban.   Goal of Therapy:  Heparin level 0.3-0.7 units/ml aPTT 66-102 seconds Monitor platelets by anticoagulation protocol: Yes   Plan:  Start apixaban 5 mg po BID  Stop IV heparin at the same time as administering apixaban  Monitor for s/sx of bleeding   Andreas Ohm, PharmD Pharmacy Resident  02/28/2023 12:52 PM

## 2023-02-28 NOTE — ED Notes (Signed)
Help get patient changed placed another brief patient is resting with call bell in reach

## 2023-02-28 NOTE — Assessment & Plan Note (Signed)
1st arrest was back when she was on dialysis (looks like Oct 2013 or earlier?). 2nd arrest was due to hypotension / hypoxia in Dec 2023 during admit for broken ankle.

## 2023-02-28 NOTE — ED Notes (Signed)
Patient reporting feeling itchy. Requesting a dose of benedryl. Dr. Nelson Chimes notified, awaiting orders/response

## 2023-02-28 NOTE — Assessment & Plan Note (Addendum)
UA vs CHF onset.  Pt with all the risk factors including CABG 1 year ago. Cards seeing in consult Hold eliquis NPO Heparin gtt Plan for stress test in AM Will also put in for 2d echo since we can't see one done at office on Fri Tele monitor Hold home torsemide for the moment given the very mild AKI Defer to cards wether we should give or hold diuretics at the moment given slight BNP elevation + pulm edema findings on CT chest Cont ASA

## 2023-02-28 NOTE — Progress Notes (Signed)
ANTICOAGULATION CONSULT NOTE  Pharmacy Consult for heparin Indication:  elevated troponin and h/o VTE  Allergies  Allergen Reactions   Lisinopril Swelling    Swelling of tongue   Hydralazine     Other reaction(s): heart palpitations    Patient Measurements: Height: 5\' 2"  (157.5 cm) Weight: 84.8 kg (187 lb) IBW/kg (Calculated) : 50.1 Heparin Dosing Weight: 70kg  Vital Signs: Temp: 97.5 F (36.4 C) (06/10 1038) Temp Source: Oral (06/10 1038) BP: 145/69 (06/10 1000) Pulse Rate: 65 (06/10 1000)  Labs: Recent Labs    02/27/23 1527 02/27/23 1900 02/27/23 2150 02/27/23 2300 02/28/23 0115 02/28/23 0900 02/28/23 1008  HGB 12.7  --   --   --  11.1*  --   --   HCT 41.7  --   --   --  36.3  --   --   PLT 193  --   --   --  180  --   --   APTT  --   --   --   --   --   --  51*  HEPARINUNFRC  --   --   --   --   --  >1.10*  --   CREATININE 1.33*  --   --   --   --   --   --   TROPONINIHS  --    < > 216* 220* 228*  --   --    < > = values in this interval not displayed.     Estimated Creatinine Clearance: 44.3 mL/min (A) (by C-G formula based on SCr of 1.33 mg/dL (H)).   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 On multiple medications.   Gastroparesis due to DM [facility])    GERD (gastroesophageal reflux disease)    Heart murmur    Hemodialysis patient [facility])    Hyperlipidemia    Hypothyroidism (acquired)    Paroxysmal supraventricular tachycardia    Peripheral arterial disease [facility]) December 2014   @ [facility] Cardiology- Billie Lade: a) RSFA PTA (01/2013); b) L SFA [place] Atherectomy/PTA with 3V runoff; LEA Dopplers  January 2016 [facility] post L SFA PTA): Mild, insignificant disease in the left CFA, profunda, SFA, popliteal artery and tibioperoneal trunk.   Sickle cell trait (HCC)     Assessment: 62yo female c/o weakness, SOB, N/V, and worsening chronic CP, troponin found to be elevated though relatively flat >> to transition from apixaban (h/o VTE) to UFH for possible procedures; last dose of apixaban taken 6/9 8a though she is unsure whether she was able to keep it keep meds down in setting of N/V.  Initial aPTT subtherapeutic at 51 seconds on 1100 units/hr, anti-Xa level elevated as expected from DOAC dose  Goal of Therapy:  Heparin level 0.3-0.7 units/ml aPTT 66-102 seconds Monitor platelets by anticoagulation protocol: Yes   Plan:  Increase heparin gtt to 1250 units/hr F/u 6 hour aPTT to confirm F/u echo and ability to transition back to PO  Daylene Posey, PharmD, [facility] Clinical Pharmacist ED Pharmacist Phone # [phone] 02/28/2023 11:21 AM

## 2023-02-28 NOTE — Assessment & Plan Note (Signed)
Cont Statin. ?

## 2023-02-28 NOTE — Progress Notes (Signed)
Heart Failure Navigator Progress Note  Assessed for Heart & Vascular TOC clinic readiness.  62 yo F who is visiting from [place], [place]. History of CAD s/p CABG in June 2023, ESRD s/p transplant, and HTN.  Admitted with weakness, shortness of breath, and emesis. Being treated for UTI. ECHO with EF 60-65%. Not fluid overloaded on exam.  Will not schedule HF TOC appt at this time.   Sharen Hones, PharmD, BCPS Heart Failure Stewardship Pharmacist Phone [phone]

## 2023-02-28 NOTE — Assessment & Plan Note (Signed)
Med rec pending, cont home BP meds

## 2023-03-01 DIAGNOSIS — Z940 Kidney transplant status: Secondary | ICD-10-CM | POA: Diagnosis not present

## 2023-03-01 DIAGNOSIS — Z79890 Hormone replacement therapy (postmenopausal): Secondary | ICD-10-CM | POA: Diagnosis not present

## 2023-03-01 DIAGNOSIS — B9620 Unspecified Escherichia coli [E. coli] as the cause of diseases classified elsewhere: Secondary | ICD-10-CM | POA: Diagnosis not present

## 2023-03-01 DIAGNOSIS — T8619 Other complication of kidney transplant: Secondary | ICD-10-CM | POA: Diagnosis present

## 2023-03-01 DIAGNOSIS — I5033 Acute on chronic diastolic (congestive) heart failure: Secondary | ICD-10-CM | POA: Diagnosis not present

## 2023-03-01 DIAGNOSIS — Y830 Surgical operation with transplant of whole organ as the cause of abnormal reaction of the patient, or of later complication, without mention of misadventure at the time of the procedure: Secondary | ICD-10-CM | POA: Diagnosis present

## 2023-03-01 DIAGNOSIS — J8410 Pulmonary fibrosis, unspecified: Secondary | ICD-10-CM | POA: Diagnosis present

## 2023-03-01 DIAGNOSIS — B9629 Other Escherichia coli [E. coli] as the cause of diseases classified elsewhere: Secondary | ICD-10-CM | POA: Diagnosis present

## 2023-03-01 DIAGNOSIS — N186 End stage renal disease: Secondary | ICD-10-CM | POA: Diagnosis present

## 2023-03-01 DIAGNOSIS — E1143 Type 2 diabetes mellitus with diabetic autonomic (poly)neuropathy: Secondary | ICD-10-CM | POA: Diagnosis present

## 2023-03-01 DIAGNOSIS — Z951 Presence of aortocoronary bypass graft: Secondary | ICD-10-CM | POA: Diagnosis not present

## 2023-03-01 DIAGNOSIS — N12 Tubulo-interstitial nephritis, not specified as acute or chronic: Secondary | ICD-10-CM | POA: Diagnosis present

## 2023-03-01 DIAGNOSIS — N390 Urinary tract infection, site not specified: Secondary | ICD-10-CM | POA: Diagnosis not present

## 2023-03-01 DIAGNOSIS — D573 Sickle-cell trait: Secondary | ICD-10-CM | POA: Diagnosis present

## 2023-03-01 DIAGNOSIS — I132 Hypertensive heart and chronic kidney disease with heart failure and with stage 5 chronic kidney disease, or end stage renal disease: Secondary | ICD-10-CM | POA: Diagnosis present

## 2023-03-01 DIAGNOSIS — E876 Hypokalemia: Secondary | ICD-10-CM | POA: Diagnosis not present

## 2023-03-01 DIAGNOSIS — Z1612 Extended spectrum beta lactamase (ESBL) resistance: Secondary | ICD-10-CM

## 2023-03-01 DIAGNOSIS — E1151 Type 2 diabetes mellitus with diabetic peripheral angiopathy without gangrene: Secondary | ICD-10-CM | POA: Diagnosis present

## 2023-03-01 DIAGNOSIS — Z1152 Encounter for screening for COVID-19: Secondary | ICD-10-CM | POA: Diagnosis not present

## 2023-03-01 DIAGNOSIS — G8929 Other chronic pain: Secondary | ICD-10-CM | POA: Diagnosis present

## 2023-03-01 DIAGNOSIS — R079 Chest pain, unspecified: Secondary | ICD-10-CM | POA: Diagnosis not present

## 2023-03-01 DIAGNOSIS — Z79899 Other long term (current) drug therapy: Secondary | ICD-10-CM | POA: Diagnosis not present

## 2023-03-01 DIAGNOSIS — R7989 Other specified abnormal findings of blood chemistry: Secondary | ICD-10-CM | POA: Diagnosis not present

## 2023-03-01 DIAGNOSIS — J9611 Chronic respiratory failure with hypoxia: Secondary | ICD-10-CM | POA: Diagnosis present

## 2023-03-01 DIAGNOSIS — Z8616 Personal history of COVID-19: Secondary | ICD-10-CM | POA: Diagnosis not present

## 2023-03-01 DIAGNOSIS — E039 Hypothyroidism, unspecified: Secondary | ICD-10-CM | POA: Diagnosis present

## 2023-03-01 DIAGNOSIS — Z794 Long term (current) use of insulin: Secondary | ICD-10-CM | POA: Diagnosis not present

## 2023-03-01 DIAGNOSIS — N179 Acute kidney failure, unspecified: Secondary | ICD-10-CM | POA: Diagnosis present

## 2023-03-01 DIAGNOSIS — E785 Hyperlipidemia, unspecified: Secondary | ICD-10-CM | POA: Diagnosis present

## 2023-03-01 DIAGNOSIS — E1122 Type 2 diabetes mellitus with diabetic chronic kidney disease: Secondary | ICD-10-CM | POA: Diagnosis present

## 2023-03-01 LAB — CULTURE, BLOOD (ROUTINE X 2): Culture: NO GROWTH

## 2023-03-01 LAB — BASIC METABOLIC PANEL
Anion gap: 11 (ref 5–15)
BUN: 19 mg/dL (ref 8–23)
CO2: 23 mmol/L (ref 22–32)
Calcium: 8.7 mg/dL — ABNORMAL LOW (ref 8.9–10.3)
Chloride: 100 mmol/L (ref 98–111)
Creatinine, Ser: 1.12 mg/dL — ABNORMAL HIGH (ref 0.44–1.00)
GFR, Estimated: 56 mL/min — ABNORMAL LOW (ref >60)
Glucose, Bld: 202 mg/dL — ABNORMAL HIGH (ref 70–99)
Potassium: 3.8 mmol/L (ref 3.5–5.1)
Sodium: 134 mmol/L — ABNORMAL LOW (ref 135–145)

## 2023-03-01 LAB — GLUCOSE, CAPILLARY
Glucose-Capillary: 143 mg/dL — ABNORMAL HIGH (ref 70–99)
Glucose-Capillary: 162 mg/dL — ABNORMAL HIGH (ref 70–99)
Glucose-Capillary: 212 mg/dL — ABNORMAL HIGH (ref 70–99)
Glucose-Capillary: 283 mg/dL — ABNORMAL HIGH (ref 70–99)

## 2023-03-01 LAB — CBC
HCT: 38.5 % (ref 36.0–46.0)
Hemoglobin: 11.7 g/dL — ABNORMAL LOW (ref 12.0–15.0)
MCH: 21.4 pg — ABNORMAL LOW (ref 26.0–34.0)
MCHC: 30.4 g/dL (ref 30.0–36.0)
MCV: 70.3 fL — ABNORMAL LOW (ref 80.0–100.0)
Platelets: 179 10*3/uL (ref 150–400)
RBC: 5.48 MIL/uL — ABNORMAL HIGH (ref 3.87–5.11)
RDW: 19.3 % — ABNORMAL HIGH (ref 11.5–15.5)
WBC: 3.9 10*3/uL — ABNORMAL LOW (ref 4.0–10.5)
nRBC: 0 % (ref 0.0–0.2)

## 2023-03-01 LAB — URINE CULTURE: Culture: 50000 — AB

## 2023-03-01 MED ORDER — SODIUM CHLORIDE 0.9 % IV SOLN
1.0000 g | Freq: Three times a day (TID) | INTRAVENOUS | Status: DC
  Administered 2023-03-01: 1 g via INTRAVENOUS
  Filled 2023-03-01 (×2): qty 20

## 2023-03-01 MED ORDER — SODIUM CHLORIDE 0.9 % IV SOLN
1.0000 g | Freq: Three times a day (TID) | INTRAVENOUS | Status: DC
  Filled 2023-03-01: qty 20

## 2023-03-01 MED ORDER — SODIUM CHLORIDE 0.9 % IV SOLN
1.0000 g | INTRAVENOUS | Status: DC
  Administered 2023-03-02 – 2023-03-03 (×2): 1000 mg via INTRAVENOUS
  Filled 2023-03-01 (×2): qty 1

## 2023-03-01 MED ORDER — DIPHENHYDRAMINE HCL 25 MG PO CAPS
25.0000 mg | ORAL_CAPSULE | Freq: Three times a day (TID) | ORAL | Status: DC | PRN
  Administered 2023-03-01 – 2023-03-03 (×4): 25 mg via ORAL
  Filled 2023-03-01 (×4): qty 1

## 2023-03-01 MED ORDER — SODIUM CHLORIDE 0.9 % IV SOLN
1.0000 g | Freq: Three times a day (TID) | INTRAVENOUS | Status: AC
  Administered 2023-03-01 – 2023-03-02 (×2): 1 g via INTRAVENOUS
  Filled 2023-03-01 (×2): qty 20

## 2023-03-01 NOTE — Consult Note (Signed)
[facility]  Total days of antibiotics 3/day 1 meropenem               Reason for Consult: pyelonephritis in renal transplant   Referring Physician:  amin  Principal Problem:   Chest pain, rule out acute myocardial infarction Active Problems:   Essential hypertension: Associated with renal disease   Diabetes mellitus, type II, insulin dependent (HCC)   Hyperlipidemia   Kidney transplant status, cadaveric   History of cardiac arrest   Pulmonary fibrosis, postinflammatory (HCC)   Elevated troponin   Hx of CABG   UTI due to extended-spectrum beta lactamase (ESBL) producing Escherichia coli    HPI: JALEEAH Lopez is a 62 y.o. female with CAD s/p 3V CABG in 2023, HTN, T2DM, ESRD on HD x 8 yrs, but now renal Txp since 2019 on tacro, mycophenelate, pred and bactrim oi proph m-w-f. Hx of cmv viremina in 01/2019. She reports having recurrent uti, but was treated with oral abtx recent and still having urgency with some subjective chills. She states that she has occasional abdominal discomfort about her transplanted kidney, RLQ. 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 On multiple medications.   Gastroparesis due to DM [facility])    GERD (gastroesophageal reflux disease)    Heart murmur    Hemodialysis patient [facility]    Hyperlipidemia    Hypothyroidism (acquired)    Paroxysmal supraventricular tachycardia    Peripheral arterial disease [facility]) December 2014   @ [facility]- Billie Lade: a) RSFA PTA (01/2013); b) L SFA [place] Atherectomy/PTA with 3V runoff; LEA  Dopplers January 2016 [facility] post L SFA PTA): Mild, insignificant disease in the left CFA, profunda, SFA, popliteal artery and tibioperoneal trunk.   Sickle cell trait (HCC)     Allergies:  Allergies  Allergen Reactions   Lisinopril Swelling    Swelling of tongue   Hydralazine     Other reaction(s): heart palpitations     MEDICATIONS:  apixaban  5 mg Oral BID   aspirin  81 mg Oral Daily   atorvastatin  20 mg Oral Daily   carvedilol  25 mg Oral BID WC   cholecalciferol  2,000 Units Oral Daily   gabapentin  300 mg Oral TID   insulin aspart  0-9 Units Subcutaneous TID AC & HS   insulin glargine-yfgn  10 Units Subcutaneous QHS   levothyroxine  100 mcg Oral Q0600   linaclotide  290 mcg Oral QAC breakfast   mycophenolate  360 mg Oral BID   NIFEdipine  60 mg Oral BID   predniSONE  5 mg Oral Daily   sodium chloride flush  3 mL Intravenous Q12H   sulfamethoxazole-trimethoprim  1 tablet Oral Q M,W,F   tacrolimus ER  6 mg Oral Daily    Social History   Tobacco Use   Smoking status: Former    Types: Cigarettes    Quit date: 09/20/1993    Years since quitting: 29.4   Smokeless tobacco: Never  Vaping Use   Vaping Use: Never used  Substance Use  Topics   Alcohol use: No    Alcohol/week: 0.0 standard drinks of alcohol   Drug use: Yes    Types: Marijuana    Comment: abused drugs in the past    Family History  Problem Relation Age of Onset   Diabetes Mother    Hypertension Mother    Heart attack Mother    Diabetes Father    Hypertension Father     Review of Systems -  12 point ros is negative except what is mentioned above.  OBJECTIVE: Temp:  [97.5 F (36.4 C)-98 F (36.7 C)] 97.6 F (36.4 C) (06/11 1157) Pulse Rate:  [66-70] 69 (06/11 1157) Resp:  [18-20] 18 (06/11 1157) BP: (137-180)/(50-62) 168/58 (06/11 1157) SpO2:  [83 %-96 %] 83 % (06/11 1157) Weight:  [82.3 kg-84.1 kg] 84.1 kg (06/11 0504) Physical Exam  Constitutional:  oriented to person, place,  and time. appears well-developed and well-nourished. No distress.  HENT: [place]/AT, PERRLA, no scleral icterus Mouth/Throat: Oropharynx is clear and moist. No oropharyngeal exudate.  Cardiovascular: Normal rate, regular rhythm and normal heart sounds. Exam reveals no gallop and no friction rub.  No murmur heard.  Pulmonary/Chest: Effort normal and breath sounds normal. No respiratory distress.  has no wheezes.  Neck = supple, no nuchal rigidity Abdominal: Soft. Bowel sounds are normal.  exhibits no distension. Mild tenderness RLQ  Lymphadenopathy: no cervical adenopathy. No axillary adenopathy Neurological: alert and oriented to person, place, and time.  Skin: Skin is warm and dry. No rash noted. No erythema.  Psychiatric: a normal mood and affect.  behavior is normal.    LABS: Results for orders placed or performed during the hospital encounter of 02/27/23 (from the past 48 hour(s))  Comprehensive metabolic panel     Status: Abnormal   Collection Time: 02/27/23  3:27 PM  Result Value Ref Range   Sodium 135 135 - 145 mmol/L   Potassium 3.4 (L) 3.5 - 5.1 mmol/L   Chloride 97 (L) 98 - 111 mmol/L   CO2 23 22 - 32 mmol/L   Glucose, Bld 217 (H) 70 - 99 mg/dL    Comment: Glucose reference range applies only to samples taken after fasting for at least 8 hours.   BUN 33 (H) 8 - 23 mg/dL   Creatinine, Ser 4.09 (H) 0.44 - 1.00 mg/dL   Calcium 9.5 8.9 - 81.1 mg/dL   Total Protein 7.6 6.5 - 8.1 g/dL   Albumin 3.5 3.5 - 5.0 g/dL   AST 18 15 - 41 U/L   ALT 13 0 - 44 U/L   Alkaline Phosphatase 82 38 - 126 U/L   Total Bilirubin 0.4 0.3 - 1.2 mg/dL   GFR, Estimated 45 (L) >60 mL/min    Comment: (NOTE) Calculated using the CKD-EPI Creatinine Equation (2021)    Anion gap 15 5 - 15    Comment: Performed at [facility] Lab, [address], [place], [place] [address]  Lipase, blood     Status: None   Collection Time: 02/27/23  3:27 PM  Result Value Ref Range   Lipase 41 11 - 51 U/L    Comment:  Performed at [facility] Lab, [address] [place], [place] [address]  CBC with Diff     Status: Abnormal   Collection Time: 02/27/23  3:27 PM  Result Value Ref Range   WBC 4.5 4.0 - 10.5 K/uL   RBC 6.02 (H) 3.87 - 5.11 MIL/uL   Hemoglobin 12.7 12.0 - 15.0 g/dL  HCT 41.7 36.0 - 46.0 %   MCV 69.3 (L) 80.0 - 100.0 fL   MCH 21.1 (L) 26.0 - 34.0 pg   MCHC 30.5 30.0 - 36.0 g/dL   RDW 16.1 (H) 09.6 - 04.5 %   Platelets 193 150 - 400 K/uL    Comment: REPEATED TO VERIFY   nRBC 0.0 0.0 - 0.2 %   Neutrophils Relative % 65 %   Neutro Abs 2.9 1.7 - 7.7 K/uL   Lymphocytes Relative 17 %   Lymphs Abs 0.8 0.7 - 4.0 K/uL   Monocytes Relative 16 %   Monocytes Absolute 0.7 0.1 - 1.0 K/uL   Eosinophils Relative 0 %   Eosinophils Absolute 0.0 0.0 - 0.5 K/uL   Basophils Relative 0 %   Basophils Absolute 0.0 0.0 - 0.1 K/uL   Smear Review Reviewed    Immature Granulocytes 2 %   Abs Immature Granulocytes 0.08 (H) 0.00 - 0.07 K/uL    Comment: Performed at [facility] Lab, [address], [place], [place] [address]  Brain natriuretic peptide     Status: Abnormal   Collection Time: 02/27/23  3:27 PM  Result Value Ref Range   B Natriuretic Peptide 195.0 (H) 0.0 - 100.0 pg/mL    Comment: Performed at [facility] Lab, [address], [place], [place] [address]  Troponin I (High Sensitivity)     Status: Abnormal   Collection Time: 02/27/23  7:00 PM  Result Value Ref Range   Troponin I (High Sensitivity) 242 (HH) <18 ng/L    Comment: CRITICAL RESULT CALLED TO, READ BACK BY AND VERIFIED WITH R,SMITH RN @1949  02/27/23 E,BENTON (NOTE) Elevated high sensitivity troponin I (hsTnI) values and significant  changes across serial measurements may suggest ACS but many other  chronic and acute conditions are known to elevate hsTnI results.  Refer to the "Links" section for chest pain algorithms and additional  guidance. Performed at [facility] Lab, [address] [place], [place] [address]   SARS  Coronavirus 2 by RT PCR (hospital order, performed in [facility] hospital lab) *cepheid single result test* Anterior Nasal Swab     Status: None   Collection Time: 02/27/23  7:16 PM   Specimen: Anterior Nasal Swab  Result Value Ref Range   SARS Coronavirus 2 by RT PCR NEGATIVE NEGATIVE    Comment: Performed at [facility] Lab, [address] [place], [place] [address]  Urinalysis, Routine w reflex microscopic -Urine, Clean Catch     Status: Abnormal   Collection Time: 02/27/23  9:50 PM  Result Value Ref Range   Color, Urine STRAW (A) YELLOW   APPearance CLEAR CLEAR   Specific Gravity, Urine 1.013 1.005 - 1.030   pH 6.0 5.0 - 8.0   Glucose, UA NEGATIVE NEGATIVE mg/dL   Hgb urine dipstick NEGATIVE NEGATIVE   Bilirubin Urine NEGATIVE NEGATIVE   Ketones, ur NEGATIVE NEGATIVE mg/dL   Protein, ur NEGATIVE NEGATIVE mg/dL   Nitrite NEGATIVE NEGATIVE   Leukocytes,Ua TRACE (A) NEGATIVE   RBC / HPF 0-5 0 - 5 RBC/hpf   WBC, UA 0-5 0 - 5 WBC/hpf   Bacteria, UA RARE (A) NONE SEEN   Squamous Epithelial / HPF 0-5 0 - 5 /HPF    Comment: Performed at [facility] Lab, [address] [place], [place] [address]  Troponin I (High Sensitivity)     Status: Abnormal   Collection Time: 02/27/23  9:50 PM  Result Value Ref Range  Troponin I (High Sensitivity) 216 (HH) <18 ng/L    Comment: DELTA CHECK NOTED CRITICAL VALUE NOTED. VALUE IS CONSISTENT WITH PREVIOUSLY REPORTED/CALLED VALUE (NOTE) Elevated high sensitivity troponin I (hsTnI) values and significant  changes across serial measurements may suggest ACS but many other  chronic and acute conditions are known to elevate hsTnI results.  Refer to the "Links" section for chest pain algorithms and additional  guidance. Performed at [facility] Lab, [address] [place], [place] [address]   Urine Culture     Status: Abnormal   Collection Time: 02/27/23 10:47 PM   Specimen: Urine, Clean Catch  Result Value Ref Range   Specimen  Description URINE, CLEAN CATCH    Special Requests      Immunocompromised Performed at [facility] Lab, [address] [place], [place] [address]    Culture (A)     50,000 COLONIES/mL ESCHERICHIA COLI Confirmed Extended Spectrum Beta-Lactamase Producer (ESBL).  In bloodstream infections from ESBL organisms, carbapenems are preferred over piperacillin/tazobactam. They are shown to have a lower risk of mortality.    Report Status 03/01/2023 FINAL    Organism ID, Bacteria ESCHERICHIA COLI (A)       Susceptibility   Escherichia coli - MIC*    AMPICILLIN >=32 RESISTANT Resistant     CEFAZOLIN >=64 RESISTANT Resistant     CEFEPIME 16 RESISTANT Resistant     CEFTRIAXONE >=64 RESISTANT Resistant     CIPROFLOXACIN 1 RESISTANT Resistant     GENTAMICIN >=16 RESISTANT Resistant     IMIPENEM <=0.25 SENSITIVE Sensitive     NITROFURANTOIN <=16 SENSITIVE Sensitive     TRIMETH/SULFA >=320 RESISTANT Resistant     AMPICILLIN/SULBACTAM >=32 RESISTANT Resistant     PIP/TAZO 16 SENSITIVE Sensitive     * 50,000 COLONIES/mL ESCHERICHIA COLI  Blood culture (routine x 2)     Status: None (Preliminary result)   Collection Time: 02/27/23 11:00 PM   Specimen: BLOOD  Result Value Ref Range   Specimen Description BLOOD SITE NOT SPECIFIED    Special Requests      BOTTLES DRAWN AEROBIC AND ANAEROBIC Blood Culture adequate volume   Culture      NO GROWTH 2 DAYS Performed at [facility] Lab, [address], [place], [place] [address]    Report Status PENDING   Troponin I (High Sensitivity)     Status: Abnormal   Collection Time: 02/27/23 11:00 PM  Result Value Ref Range   Troponin I (High Sensitivity) 220 (HH) <18 ng/L    Comment: CRITICAL VALUE NOTED. VALUE IS CONSISTENT WITH PREVIOUSLY REPORTED/CALLED VALUE (NOTE) Elevated high sensitivity troponin I (hsTnI) values and significant  changes across serial measurements may suggest ACS but many other  chronic and acute conditions are known to elevate  hsTnI results.  Refer to the "Links" section for chest pain algorithms and additional  guidance. Performed at [facility] Lab, [address], [place], [place] [address]   Troponin I (High Sensitivity)     Status: Abnormal   Collection Time: 02/28/23  1:15 AM  Result Value Ref Range   Troponin I (High Sensitivity) 228 (HH) <18 ng/L    Comment: CRITICAL VALUE NOTED. VALUE IS CONSISTENT WITH PREVIOUSLY REPORTED/CALLED VALUE (NOTE) Elevated high sensitivity troponin I (hsTnI) values and significant  changes across serial measurements may suggest ACS but many other  chronic and acute conditions are known to elevate hsTnI results.  Refer to the "Links" section for chest pain algorithms and additional  guidance. Performed at [facility] Lab, [address] [place], [place] [address]   Hemoglobin A1c     Status: Abnormal   Collection Time: 02/28/23  1:15 AM  Result Value Ref Range   Hgb A1c MFr Bld 7.7 (H) 4.8 - 5.6 %    Comment: (NOTE) Pre diabetes:          5.7%-6.4%  Diabetes:              >6.4%  Glycemic control for   <7.0% adults with diabetes    Mean Plasma Glucose 174.29 mg/dL    Comment: Performed at [facility] Lab, [address], [place], [place] [address]  CBC     Status: Abnormal   Collection Time: 02/28/23  1:15 AM  Result Value Ref Range   WBC 4.0 4.0 - 10.5 K/uL   RBC 5.30 (H) 3.87 - 5.11 MIL/uL   Hemoglobin 11.1 (L) 12.0 - 15.0 g/dL   HCT 56.2 13.0 - 86.5 %   MCV 68.5 (L) 80.0 - 100.0 fL   MCH 20.9 (L) 26.0 - 34.0 pg   MCHC 30.6 30.0 - 36.0 g/dL   RDW 78.4 (H) 69.6 - 29.5 %   Platelets 180 150 - 400 K/uL    Comment: REPEATED TO VERIFY   nRBC 0.0 0.0 - 0.2 %    Comment: Performed at [facility] Lab, [address] [place], [place] [address]  CBG monitoring, ED     Status: Abnormal   Collection Time: 02/28/23  1:57 AM  Result Value Ref Range   Glucose-Capillary 112 (H) 70 - 99 mg/dL    Comment: Glucose reference range applies only to samples  taken after fasting for at least 8 hours.  CBG monitoring, ED     Status: Abnormal   Collection Time: 02/28/23  4:06 AM  Result Value Ref Range   Glucose-Capillary 108 (H) 70 - 99 mg/dL    Comment: Glucose reference range applies only to samples taken after fasting for at least 8 hours.  Heparin level (unfractionated)     Status: Abnormal   Collection Time: 02/28/23  9:00 AM  Result Value Ref Range   Heparin Unfractionated >1.10 (H) 0.30 - 0.70 IU/mL    Comment: (NOTE) The clinical reportable range upper limit is being lowered to >1.10 to align with the FDA approved guidance for the current laboratory assay.  If heparin results are below expected values, and patient dosage has  been confirmed, suggest follow up testing of antithrombin III levels. Performed at [facility] Lab, [address] [place], [place] [address]   Blood culture (routine x 2)     Status: None (Preliminary result)   Collection Time: 02/28/23 10:08 AM   Specimen: BLOOD  Result Value Ref Range   Specimen Description BLOOD SITE NOT SPECIFIED    Special Requests      BOTTLES DRAWN AEROBIC AND ANAEROBIC Blood Culture results may not be optimal due to an excessive volume of blood received in culture bottles   Culture      NO GROWTH < 24 HOURS Performed at [facility] Lab, [address], [place], [place] [address]    Report Status PENDING   APTT     Status: Abnormal   Collection Time: 02/28/23 10:08 AM  Result Value Ref Range   aPTT 51 (H) 24 - 36 seconds    Comment:        IF BASELINE aPTT IS ELEVATED, SUGGEST PATIENT RISK  ASSESSMENT BE USED TO DETERMINE APPROPRIATE ANTICOAGULANT THERAPY. Performed at [facility] Lab, [address] [place], [place] [address]   CBG monitoring, ED     Status: Abnormal   Collection Time: 02/28/23 10:08 AM  Result Value Ref Range   Glucose-Capillary 134 (H) 70 - 99 mg/dL    Comment: Glucose reference range applies only to samples taken after fasting for at least 8  hours.  Troponin I (High Sensitivity)     Status: Abnormal   Collection Time: 02/28/23  4:10 PM  Result Value Ref Range   Troponin I (High Sensitivity) 121 (HH) <18 ng/L    Comment: CRITICAL VALUE NOTED. VALUE IS CONSISTENT WITH PREVIOUSLY REPORTED/CALLED VALUE (NOTE) Elevated high sensitivity troponin I (hsTnI) values and significant  changes across serial measurements may suggest ACS but many other  chronic and acute conditions are known to elevate hsTnI results.  Refer to the "Links" section for chest pain algorithms and additional  guidance. Performed at [facility] Lab, [address] [place], [place] [address]   Sedimentation rate     Status: None   Collection Time: 02/28/23  4:10 PM  Result Value Ref Range   Sed Rate 17 0 - 22 mm/hr    Comment: Performed at [facility] Lab, [address], [place], [place] [address]  C-reactive protein     Status: Abnormal   Collection Time: 02/28/23  4:10 PM  Result Value Ref Range   CRP 6.2 (H) <1.0 mg/dL    Comment: Performed at [facility] Lab, [address], [place], [place] [address]  Glucose, capillary     Status: Abnormal   Collection Time: 02/28/23  5:06 PM  Result Value Ref Range   Glucose-Capillary 185 (H) 70 - 99 mg/dL    Comment: Glucose reference range applies only to samples taken after fasting for at least 8 hours.  Troponin I (High Sensitivity)     Status: Abnormal   Collection Time: 02/28/23  6:34 PM  Result Value Ref Range   Troponin I (High Sensitivity) 116 (HH) <18 ng/L    Comment: CRITICAL VALUE NOTED. VALUE IS CONSISTENT WITH PREVIOUSLY REPORTED/CALLED VALUE (NOTE) Elevated high sensitivity troponin I (hsTnI) values and significant  changes across serial measurements may suggest ACS but many other  chronic and acute conditions are known to elevate hsTnI results.  Refer to the "Links" section for chest pain algorithms and additional  guidance. Performed at [facility] Lab, [address],  [place], [place] [address]   Glucose, capillary     Status: Abnormal   Collection Time: 02/28/23  9:47 PM  Result Value Ref Range   Glucose-Capillary 173 (H) 70 - 99 mg/dL    Comment: Glucose reference range applies only to samples taken after fasting for at least 8 hours.  Basic metabolic panel     Status: Abnormal   Collection Time: 03/01/23 12:52 AM  Result Value Ref Range   Sodium 134 (L) 135 - 145 mmol/L   Potassium 3.8 3.5 - 5.1 mmol/L   Chloride 100 98 - 111 mmol/L   CO2 23 22 - 32 mmol/L   Glucose, Bld 202 (H) 70 - 99 mg/dL    Comment: Glucose reference range applies only to samples taken after fasting for at least 8 hours.   BUN 19 8 - 23 mg/dL   Creatinine, Ser 7.84 (H) 0.44 - 1.00 mg/dL   Calcium 8.7 (L) 8.9 - 10.3 mg/dL   GFR, Estimated 56 (  L) >60 mL/min    Comment: (NOTE) Calculated using the CKD-EPI Creatinine Equation (2021)    Anion gap 11 5 - 15    Comment: Performed at [facility] Lab, [address] [place], [place] [address]  CBC     Status: Abnormal   Collection Time: 03/01/23 12:52 AM  Result Value Ref Range   WBC 3.9 (L) 4.0 - 10.5 K/uL   RBC 5.48 (H) 3.87 - 5.11 MIL/uL   Hemoglobin 11.7 (L) 12.0 - 15.0 g/dL   HCT 69.6 29.5 - 28.4 %   MCV 70.3 (L) 80.0 - 100.0 fL   MCH 21.4 (L) 26.0 - 34.0 pg   MCHC 30.4 30.0 - 36.0 g/dL   RDW 13.2 (H) 44.0 - 10.2 %   Platelets 179 150 - 400 K/uL   nRBC 0.0 0.0 - 0.2 %    Comment: Performed at [facility] Lab, [address] [place], [place] [address]  Glucose, capillary     Status: Abnormal   Collection Time: 03/01/23  6:28 AM  Result Value Ref Range   Glucose-Capillary 143 (H) 70 - 99 mg/dL    Comment: Glucose reference range applies only to samples taken after fasting for at least 8 hours.  Glucose, capillary     Status: Abnormal   Collection Time: 03/01/23 11:52 AM  Result Value Ref Range   Glucose-Capillary 162 (H) 70 - 99 mg/dL    Comment: Glucose reference range applies only to samples taken after  fasting for at least 8 hours.    MICRO:  IMAGING: VAS [place] LOWER EXTREMITY VENOUS (DVT) (ONLY [facility] & [facility])  Result Date: 02/28/2023  Lower Venous DVT Study Patient Name:  [name] [facility]  Date of Exam:   02/27/2023 Medical Rec #: [id number]        Accession #:    [id number] Date of Birth: [birth year]        Patient Gender: F Patient Age:   62 years Exam Location:  [facility] Procedure:      VAS [place] LOWER EXTREMITY VENOUS (DVT) Referring Phys: Lorenda Cahill HENDERLY --------------------------------------------------------------------------------  Indications: Swelling, and Patient had ankle surgery December 2023. Bedrest until February. Placed in cam walker in February, just released from wearing cam walker within the last month.  Limitations: Pain with compression. Unable to turn lights off in room. Comparison Study: Prior right LEV done at [facility] 09/10/2022. Negative for                   DVT. Performing Technologist: Sherren Kerns RVS  Examination Guidelines: A complete evaluation includes B-mode imaging, spectral Doppler, color Doppler, and power Doppler as needed of all accessible portions of each vessel. Bilateral testing is considered an integral part of a complete examination. Limited examinations for reoccurring indications may be performed as noted. The reflux portion of the exam is performed with the patient in reverse Trendelenburg.  +---------+---------------+---------+-----------+----------+-------------------+ RIGHT    CompressibilityPhasicitySpontaneityPropertiesThrombus Aging      +---------+---------------+---------+-----------+----------+-------------------+ CFV      Full           Yes      Yes                                      +---------+---------------+---------+-----------+----------+-------------------+ SFJ      Full                                                             +---------+---------------+---------+-----------+----------+-------------------+  FV Prox   Full           Yes                                               +---------+---------------+---------+-----------+----------+-------------------+ FV Mid   Full                                                             +---------+---------------+---------+-----------+----------+-------------------+ FV DistalFull           Yes      Yes                                      +---------+---------------+---------+-----------+----------+-------------------+ PFV      Full           Yes      Yes                                      +---------+---------------+---------+-----------+----------+-------------------+ POP      Full           Yes      Yes                                      +---------+---------------+---------+-----------+----------+-------------------+ PTV      Full                                                             +---------+---------------+---------+-----------+----------+-------------------+ PERO                                                  Not well visualized +---------+---------------+---------+-----------+----------+-------------------+ Gastroc                 Yes      Yes                                      +---------+---------------+---------+-----------+----------+-------------------+   +----+---------------+---------+-----------+----------+--------------+ LEFTCompressibilityPhasicitySpontaneityPropertiesThrombus Aging +----+---------------+---------+-----------+----------+--------------+ CFV Full           Yes      Yes                                 +----+---------------+---------+-----------+----------+--------------+     Summary: RIGHT: - There is no evidence of deep vein thrombosis in the lower extremity.  - No cystic structure found in the popliteal fossa.  LEFT: - No evidence of common femoral vein obstruction.  *See table(s) above for measurements and observations. Electronically  signed by Lemar Livings MD on  02/28/2023 at 2:30:43 PM.    Final    ECHOCARDIOGRAM COMPLETE  Result Date: 02/28/2023    ECHOCARDIOGRAM REPORT   Patient Name:   KAMYIA FEHL [facility] Date of Exam: 02/28/2023 Medical Rec #:  [id number]       Height:       62.0 in Accession #:    [id number]      Weight:       187.0 lb Date of Birth:  21-Aug-1961       BSA:          1.858 m Patient Age:    62 years        BP:           152/72 mmHg Patient Gender: F               HR:           67 bpm. Exam Location:  Inpatient Procedure: 2D Echo, Cardiac Doppler and Color Doppler Indications:    r/o acute infarction  History:        Patient has prior history of Echocardiogram examinations, most                 recent 04/26/2022. Prior CABG, PAD, Signs/Symptoms:Shortness of                 Breath; Risk Factors:Hypertension, Diabetes and Dyslipidemia.  Sonographer:    Melissa Morford RDCS (AE, PE) Referring Phys: [id number] JARED M GARDNER IMPRESSIONS  1. Left ventricular ejection fraction, by estimation, is 60 to 65%. The left ventricle has normal function. The left ventricle has no regional wall motion abnormalities. There is mild concentric left ventricular hypertrophy. Left ventricular diastolic parameters are consistent with Grade II diastolic dysfunction (pseudonormalization).  2. Right ventricular systolic function is normal. The right ventricular size is normal.  3. The mitral valve is grossly normal. No evidence of mitral valve regurgitation. No evidence of mitral stenosis.  4. The aortic valve is tricuspid. There is mild calcification of the aortic valve. There is mild thickening of the aortic valve. Aortic valve regurgitation is not visualized. Aortic valve sclerosis/calcification is present, without any evidence of aortic stenosis.  5. The inferior vena cava is normal in size with greater than 50% respiratory variability, suggesting right atrial pressure of 3 mmHg. Comparison(s): No significant change from prior study. FINDINGS  Left Ventricle: Left ventricular ejection  fraction, by estimation, is 60 to 65%. The left ventricle has normal function. The left ventricle has no regional wall motion abnormalities. The left ventricular internal cavity size was normal in size. There is  mild concentric left ventricular hypertrophy. Left ventricular diastolic parameters are consistent with Grade II diastolic dysfunction (pseudonormalization). Right Ventricle: The right ventricular size is normal. Right vetricular wall thickness was not well visualized. Right ventricular systolic function is normal. Left Atrium: Left atrial size was normal in size. Right Atrium: Right atrial size was normal in size. Pericardium: There is no evidence of pericardial effusion. Mitral Valve: The mitral valve is grossly normal. Mild to moderate mitral annular calcification. No evidence of mitral valve regurgitation. No evidence of mitral valve stenosis. Tricuspid Valve: The tricuspid valve is normal in structure. Tricuspid valve regurgitation is trivial. Aortic Valve: The aortic valve is tricuspid. There is mild calcification of the aortic valve. There is mild thickening of the aortic valve. Aortic valve regurgitation is not visualized. Aortic valve sclerosis/calcification is present, without any evidence of aortic stenosis. Pulmonic Valve: The  pulmonic valve was normal in structure. Pulmonic valve regurgitation is trivial. Aorta: The aortic root and ascending aorta are structurally normal, with no evidence of dilitation. Venous: The inferior vena cava is normal in size with greater than 50% respiratory variability, suggesting right atrial pressure of 3 mmHg. IAS/Shunts: The atrial septum is grossly normal.  LEFT VENTRICLE PLAX 2D LVIDd:         3.90 cm   Diastology LVIDs:         2.80 cm   LV e' medial:    4.57 cm/s LV PW:         1.20 cm   LV E/e' medial:  23.2 LV IVS:        1.40 cm   LV e' lateral:   6.74 cm/s LVOT diam:     2.10 cm   LV E/e' lateral: 15.7 LV SV:         84 LV SV Index:   45 LVOT Area:      3.46 cm  RIGHT VENTRICLE RV S prime:     8.49 cm/s TAPSE (M-mode): 1.4 cm LEFT ATRIUM             Index        RIGHT ATRIUM           Index LA diam:        3.80 cm 2.05 cm/m   RA Area:     19.50 cm LA Vol (A2C):   39.1 ml 21.05 ml/m  RA Volume:   53.00 ml  28.53 ml/m LA Vol (A4C):   45.0 ml 24.22 ml/m LA Biplane Vol: 42.0 ml 22.61 ml/m  AORTIC VALVE LVOT Vmax:   95.80 cm/s LVOT Vmean:  75.000 cm/s LVOT VTI:    0.242 m  AORTA Ao Root diam: 2.40 cm Ao Asc diam:  2.80 cm MITRAL VALVE                TRICUSPID VALVE MV Area (PHT): 3.61 cm     TR Peak grad:   14.9 mmHg MV E velocity: 106.00 cm/s  TR Vmax:        193.00 cm/s MV A velocity: 85.90 cm/s MV E/A ratio:  1.23         SHUNTS                             Systemic VTI:  0.24 m                             Systemic Diam: 2.10 cm Laurance Flatten MD Electronically signed by Laurance Flatten MD Signature Date/Time: 02/28/2023/11:15:03 AM    Final    CT Angio Chest PE W and/or Wo Contrast  Result Date: 02/27/2023 CLINICAL DATA:  Epigastric pain, chest pain, abdominal pain, emesis and shortness of breath. History of DVT. EXAM: CT ANGIOGRAPHY CHEST CT ABDOMEN AND PELVIS WITH CONTRAST TECHNIQUE: Multidetector CT imaging of the chest was performed using the standard protocol during bolus administration of intravenous contrast. Multiplanar CT image reconstructions and MIPs were obtained to evaluate the vascular anatomy. Multidetector CT imaging of the abdomen and pelvis was performed using the standard protocol during bolus administration of intravenous contrast. RADIATION DOSE REDUCTION: This exam was performed according to the departmental dose-optimization program which includes automated exposure control, adjustment of the mA and/or kV according to patient size and/or use of iterative reconstruction technique. CONTRAST:  60mL OMNIPAQUE IOHEXOL 350 MG/ML SOLN COMPARISON:  CTA chest 05/11/2022 and CT renal stone protocol 05/10/2022 FINDINGS: CTA CHEST FINDINGS  Cardiovascular: Sternotomy and CABG. Coronary artery and aortic atherosclerotic calcification. Cardiomegaly. No pericardial effusion. Satisfactory opacification of the pulmonary arteries to the segmental level. No pulmonary embolism. Mediastinum/Nodes: Unremarkable trachea and esophagus. No thoracic adenopathy. Lungs/Pleura: Mild diffuse interlobular septal thickening and patchy ground-glass opacities greatest in the lower lungs, improved compared with 05/11/2022. Bibasilar atelectasis/scarring. No pleural effusion or pneumothorax. Musculoskeletal: No acute fracture. Review of the MIP images confirms the above findings. CT ABDOMEN and PELVIS FINDINGS Hepatobiliary: Unremarkable liver, gallbladder, and biliary tree. Pancreas: Unremarkable. Spleen: Unremarkable. Adrenals/Urinary Tract: Normal adrenal glands. Atrophic native kidneys. Right lower quadrant renal allograft no urinary calculi or hydronephrosis. Unremarkable bladder. Areas of cortical geographic hypoattenuation within the renal transplant suspicious for pyelonephritis. Stomach/Bowel: Normal caliber large and small bowel. Colonic diverticulosis without diverticulitis. Normal appendix. Stomach is within normal limits. Vascular/Lymphatic: Aortic atherosclerosis. No enlarged abdominal or pelvic lymph nodes. Reproductive: Hysterectomy. Other: No free intraperitoneal fluid or air. 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 Superior endplate compression deformity of T10 is presumed chronic. No acute fracture. Review of the MIP images confirms the above findings. IMPRESSION: 1. Negative for acute pulmonary embolism. 2. Findings suggestive of congestive heart failure with mild pulmonary edema. 3. Areas of geographic hypoattenuation within the right lower quadrant renal transplant suspicious for pyelonephritis. 4. 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 Electronically Signed    By: Minerva Fester M.D.   On: 02/27/2023 21:46   CT ABDOMEN PELVIS W CONTRAST  Result Date: 02/27/2023 CLINICAL DATA:  Epigastric pain, chest pain, abdominal pain, emesis and shortness of breath. History of DVT. EXAM: CT ANGIOGRAPHY CHEST CT ABDOMEN AND PELVIS WITH CONTRAST TECHNIQUE: Multidetector CT imaging of the chest was performed using the standard protocol during bolus administration of intravenous contrast. Multiplanar CT image reconstructions and MIPs were obtained to evaluate the vascular anatomy. Multidetector CT imaging of the abdomen and pelvis was performed using the standard protocol during bolus administration of intravenous contrast. RADIATION DOSE REDUCTION: This exam was performed according to the departmental dose-optimization program which includes automated exposure control, adjustment of the mA and/or kV according to patient size and/or use of iterative reconstruction technique. CONTRAST:  60mL OMNIPAQUE IOHEXOL 350 MG/ML SOLN COMPARISON:  CTA chest 05/11/2022 and CT renal stone protocol 05/10/2022 FINDINGS: CTA CHEST FINDINGS Cardiovascular: Sternotomy and CABG. Coronary artery and aortic atherosclerotic calcification. Cardiomegaly. No pericardial effusion. Satisfactory opacification of the pulmonary arteries to the segmental level. No pulmonary embolism. Mediastinum/Nodes: Unremarkable trachea and esophagus. No thoracic adenopathy. Lungs/Pleura: Mild diffuse interlobular septal thickening and patchy ground-glass opacities greatest in the lower lungs, improved compared with 05/11/2022. Bibasilar atelectasis/scarring. No pleural effusion or pneumothorax. Musculoskeletal: No acute fracture. Review of the MIP images confirms the above findings. CT ABDOMEN and PELVIS FINDINGS Hepatobiliary: Unremarkable liver, gallbladder, and biliary tree. Pancreas: Unremarkable. Spleen: Unremarkable. Adrenals/Urinary Tract: Normal adrenal glands. Atrophic native kidneys. Right lower quadrant renal  allograft no urinary calculi or hydronephrosis. Unremarkable bladder. Areas of cortical geographic hypoattenuation within the renal transplant suspicious for pyelonephritis. Stomach/Bowel: Normal caliber large and small bowel. Colonic diverticulosis without diverticulitis. Normal appendix. Stomach is within normal limits. Vascular/Lymphatic: Aortic atherosclerosis. No enlarged abdominal or pelvic lymph nodes. Reproductive: Hysterectomy. Other: No free intraperitoneal fluid or air. 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 Superior endplate compression deformity  of T10 is presumed chronic. No acute fracture. Review of the MIP images confirms the above findings. IMPRESSION: 1. Negative for acute pulmonary embolism. 2. Findings suggestive of congestive heart failure with mild pulmonary edema. 3. Areas of geographic hypoattenuation within the right lower quadrant renal transplant suspicious for pyelonephritis. 4. 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 Electronically Signed   By: Minerva Fester M.D.   On: 02/27/2023 21:46   DG Chest Portable 1 View  Result Date: 02/27/2023 CLINICAL DATA:  Shortness of breath. EXAM: PORTABLE CHEST 1 VIEW COMPARISON:  05/10/2022 FINDINGS: The cardio pericardial silhouette is enlarged. The lungs are clear without focal pneumonia, edema, pneumothorax or pleural effusion. Subtle atelectasis or scarring noted at the left base. Vascular stent device noted in the left axillary region. No acute bony abnormality. Telemetry leads overlie the chest. IMPRESSION: No acute cardiopulmonary findings. Subtle atelectasis or scarring is noted at the left base. Electronically Signed   By: Kennith Center M.D.   On: 02/27/2023 17:59    HISTORICAL MICRO/IMAGING  Assessment/Plan:  62yo F with hx of renal transplant admitted with esbl ecoli uti  - plan to do ertapenem daily to treat esbl ecoli +urinary  infection - plan to treat for 5-7 days to see if improving - consider getting IR to place line to finish out course  Renal transplant = continue baseline immune suppression

## 2023-03-01 NOTE — Evaluation (Signed)
Physical Therapy Evaluation Patient Details Name: [name] MRN: [id number] DOB: 1960-10-08 Today's Date: 03/01/2023  History of Present Illness  62 y.o. female presents to [facility] hospital on 02/27/2023 with fatigue and intermittent chest tightness. Of note pt recently battling UTI/pyelonephritis. PMH: chronic respiratory failure and is on 3 L at home since secondary to a COVID-19 infection; renal transplant is on immunosuppressant; HTN; DVTs, DM, ESRD on HD, PN, PAD, CABG.  Clinical Impression  Pt presents to PT with deficits in functional mobility, gait, balance, strength, power. Pt reports being limited by significant chronic hip pain due to OA. Pt refuses attempts at ambulation during this session due to pain, but she is able to stand without physical assistance. Pt reports she is able to utilize a wheelchair within the home if needed, as she does this on days when her hip pain is significant at baseline. PT will follow up during this admission in an effort to improve mobility quality and activity tolerance. PT recommends continued HHPT and intermittent assistance from family/aide.     Recommendations for follow up therapy are one component of a multi-disciplinary discharge planning process, led by the attending physician.  Recommendations may be updated based on patient status, additional functional criteria and insurance authorization.  Follow Up Recommendations       Assistance Recommended at Discharge Intermittent Supervision/Assistance  Patient can return home with the following  A little help with walking and/or transfers;A little help with bathing/dressing/bathroom;Assistance with cooking/housework;Assist for transportation;Help with stairs or ramp for entrance    Equipment Recommendations None recommended by PT (owns necessary DME)  Recommendations for Other Services       Functional Status Assessment Patient has had a recent decline in their functional status and demonstrates  the ability to make significant improvements in function in a reasonable and predictable amount of time.     Precautions / Restrictions Precautions Precautions: Fall Precaution Comments: bilateral hip OA limiting mobility at baseline Restrictions Weight Bearing Restrictions: No      Mobility  Bed Mobility Overal bed mobility: Needs Assistance Bed Mobility: Supine to Sit     Supine to sit: Modified independent (Device/Increase time), HOB elevated     General bed mobility comments: increased time    Transfers Overall transfer level: Needs assistance Equipment used: None Transfers: Sit to/from Stand Sit to Stand: Supervision                Ambulation/Gait Ambulation/Gait assistance: Supervision           Pre-gait activities: pt refuses ambulation due to hip pain and stiffness, takes 2 steps in place at edge of bed    Stairs            Wheelchair Mobility    Modified Rankin (Stroke Patients Only)       Balance Overall balance assessment: Needs assistance Sitting-balance support: No upper extremity supported, Feet supported Sitting balance-Leahy Scale: Good     Standing balance support: Single extremity supported, Bilateral upper extremity supported, Reliant on assistive device for balance Standing balance-Leahy Scale: Poor                               Pertinent Vitals/Pain Pain Assessment Pain Assessment: Faces Faces Pain Scale: Hurts whole lot Pain Location: hips Pain Descriptors / Indicators: Aching Pain Intervention(s): Monitored during session    Home Living Family/patient expects to be discharged to:: Private residence Living Arrangements: Alone Available Help at Discharge:  Family;Personal care attendant;Available PRN/intermittently (aide 2 hours daily) Type of Home: Mobile home Home Access: Stairs to enter Entrance Stairs-Rails: Can reach both Entrance Stairs-Number of Steps: 6   Home Layout: One level Home  Equipment: Agricultural consultant (2 wheels);BSC/3in1;Shower seat;Wheelchair - manual      Prior Function Prior Level of Function : Independent/Modified Independent             Mobility Comments: ambulates with PRN use of RW, if hip pain is bad she does utilize [facility] for mobility at times       Hand Dominance   Dominant Hand: Right    Extremity/Trunk Assessment   Upper Extremity Assessment Upper Extremity Assessment: Overall WFL for tasks assessed    Lower Extremity Assessment Lower Extremity Assessment: Generalized weakness    Cervical / Trunk Assessment Cervical / Trunk Assessment: Normal  Communication   Communication: No difficulties  Cognition Arousal/Alertness: Awake/alert Behavior During Therapy: WFL for tasks assessed/performed Overall Cognitive Status: Within Functional Limits for tasks assessed                                          General Comments General comments (skin integrity, edema, etc.): VSS, pt requiring 3L [place] to maintain sats in low 90s, desats to 88% on 2L [place]    Exercises     Assessment/Plan    PT Assessment Patient needs continued PT services  PT Problem List Decreased strength;Decreased activity tolerance;Decreased balance;Decreased mobility;Pain       PT Treatment Interventions DME instruction;Gait training;Functional mobility training;Therapeutic activities;Stair training;Therapeutic exercise;Balance training;Neuromuscular re-education;Patient/family education;Wheelchair mobility training    PT Goals (Current goals can be found in the Care Plan section)  Acute Rehab PT Goals Patient Stated Goal: to return home PT Goal Formulation: With patient Time For Goal Achievement: 03/15/23 Potential to Achieve Goals: Good    Frequency Min 3X/week     Co-evaluation               AM-PAC PT "6 Clicks" Mobility  Outcome Measure Help needed turning from your back to your side while in a flat bed without using bedrails?:  None Help needed moving from lying on your back to sitting on the side of a flat bed without using bedrails?: A Little Help needed moving to and from a bed to a chair (including a wheelchair)?: A Little Help needed standing up from a chair using your arms (e.g., wheelchair or bedside chair)?: A Little Help needed to walk in hospital room?: Total Help needed climbing 3-5 steps with a railing? : Total 6 Click Score: 15    End of Session Equipment Utilized During Treatment: Oxygen Activity Tolerance: Patient limited by pain (hip pain) Patient left: in bed;with call bell/phone within reach;with bed alarm set Nurse Communication: Mobility status PT Visit Diagnosis: Other abnormalities of gait and mobility (R26.89);Muscle weakness (generalized) (M62.81);Pain Pain - part of body: Hip    Time: 4098-1191 PT Time Calculation (min) (ACUTE ONLY): 17 min   Charges:   PT Evaluation $PT Eval Low Complexity: 1 Low          Arlyss Gandy, PT, DPT Acute Rehabilitation Office [phone]   Arlyss Gandy 03/01/2023, 12:22 PM

## 2023-03-01 NOTE — Progress Notes (Signed)
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  Patient was also recently diagnosed with outpatient UTI/pyelonephritis on antibiotics.  Upon admission there was concerns of ACS especially in the setting of recent CABG.  Cardiology team consulted.  Echocardiogram overall appears stable with normal LV function therefore heparin drip was discontinued, started back again on Eliquis.  Eventually her urine cultures resulted ESBL therefore transition to meropenem.   Assessment & Plan:  Principal Problem:   Chest pain, rule out acute myocardial infarction Active Problems:   Kidney transplant status, cadaveric   Essential hypertension: Associated with renal disease   Diabetes mellitus, type II, insulin dependent (HCC)   Hyperlipidemia   History of cardiac arrest   Pulmonary fibrosis, postinflammatory (HCC)   Elevated troponin   Hx of CABG   UTI due to extended-spectrum beta lactamase (ESBL) producing Escherichia coli     Assessment and Plan: * Chest pain, rule out acute myocardial infarction CAD status post CABG June 2023 Troponins mildly elevated 216 > 228.  Patient already on outpatient Eliquis which is currently on hold and placed on heparin drip.  Echocardiogram shows EF 65%, grade 2 DD. CTA neg for PE.    Kidney transplant status, Oct 2019 UTI/Pyelonephritis. POA.  Now showing ESBL -Follows at [facility]. Continue outpatient tacrolimus, mycophenolate and prednisone.  Bactrim Monday Wednesday Friday - Recently diagnosed outpatient and was on Omnicef but  results here shows that patient has ESBL.  Will start patient on meropenem, plan for total of 5 days  Pulmonary fibrosis, postinflammatory (HCC) PIPF of RLL and intermittent use of home O2 at baseline following COVID-19 back in 2020.  Not on O2 at all times though.  History of right lower extremity DVT - On Eliquis  History of cardiac arrest 1st arrest was back when she was on dialysis (looks like Oct 2013 or earlier?). 2nd arrest was due to hypotension / hypoxia in Dec 2023 during admit for broken ankle.  Hyperlipidemia Cont Statin  Diabetes mellitus, type II, insulin dependent (HCC) Peripheral Neuropathy.  -A1c 7.7. -Semglee 10 units daily.  Sliding scale and Accu-Chek.  Adjust as necessary  Essential hypertension: Associated with renal disease Currently on Coreg, Procardia.  IV as needed  Hypokalemia - As needed repletion  Hypothyroidism - Synthroid      DVT prophylaxis: on Hep Drip Code Status: Full Family Communication:   Continue hospital stay for IV meropenem.   Diet Orders (From admission, onward)     Start     Ordered   02/28/23 1038  Diet heart healthy/carb modified Room service appropriate? Yes; Fluid consistency: Thin  Diet effective now       Question Answer Comment  Diet-HS Snack? Nothing   Room service appropriate? Yes   Fluid consistency: Thin      02/28/23 1037            Subjective: Denies any chest pain, no complaints.   Examination:  Constitutional: Not in acute distress Respiratory: Clear to auscultation bilaterally Cardiovascular: Normal sinus rhythm, no rubs Abdomen: Nontender nondistended good bowel sounds Musculoskeletal: No  edema noted Skin: No rashes seen Neurologic: CN 2-12 grossly intact.  And nonfocal Psychiatric: Normal judgment and insight. Alert and oriented x 3. Normal mood.    Objective: Vitals:   02/28/23 2050 03/01/23 0137 03/01/23 0504 03/01/23 0750  BP:  (!) 180/60 (!) 149/53 (!) 154/62  Pulse:  70 69 66   Resp:  20 20 18   Temp:  97.6 F (36.4 C) (!) 97.5 F (36.4 C) 97.7 F (36.5 C)  TempSrc:  Oral Oral Oral  SpO2: 94% 95% 96% 96%  Weight:   84.1 kg   Height:        Intake/Output Summary (Last 24 hours) at 03/01/2023 1250 Last data filed at 03/01/2023 0916 Gross per 24 hour  Intake 483 ml  Output 210 ml  Net 273 ml   Filed Weights   02/27/23 1439 02/28/23 1537 03/01/23 0504  Weight: 84.8 kg 82.3 kg 84.1 kg    Scheduled Meds:  apixaban  5 mg Oral BID   aspirin  81 mg Oral Daily   atorvastatin  20 mg Oral Daily   carvedilol  25 mg Oral BID WC   cholecalciferol  2,000 Units Oral Daily   gabapentin  300 mg Oral TID   insulin aspart  0-9 Units Subcutaneous TID AC & HS   insulin glargine-yfgn  10 Units Subcutaneous QHS   levothyroxine  100 mcg Oral Q0600   linaclotide  290 mcg Oral QAC breakfast   mycophenolate  360 mg Oral BID   NIFEdipine  60 mg Oral BID   predniSONE  5 mg Oral Daily   sodium chloride flush  3 mL Intravenous Q12H   sulfamethoxazole-trimethoprim  1 tablet Oral Q M,W,F   tacrolimus ER  6 mg Oral Daily   Continuous Infusions:  sodium chloride     meropenem (MERREM) IV 1 g (03/01/23 1210)    Nutritional status     Body mass index is 33.93 kg/m.  Data Reviewed:   CBC: Recent Labs  Lab 02/27/23 1527 02/28/23 0115 03/01/23 0052  WBC 4.5 4.0 3.9*  NEUTROABS 2.9  --   --   HGB 12.7 11.1* 11.7*  HCT 41.7 36.3 38.5  MCV 69.3* 68.5* 70.3*  PLT 193 180 179   Basic Metabolic Panel: Recent Labs  Lab 02/27/23 1527 03/01/23 0052  NA 135 134*  K 3.4* 3.8  CL 97* 100  CO2 23 23  GLUCOSE 217* 202*  BUN 33* 19  CREATININE 1.33* 1.12*  CALCIUM 9.5 8.7*   GFR: Estimated Creatinine Clearance: 52.4 mL/min (A) (by C-G formula based on SCr of 1.12 mg/dL (H)). Liver Function Tests: Recent Labs  Lab 02/27/23 1527  AST 18  ALT 13  ALKPHOS 82  BILITOT 0.4  PROT 7.6  ALBUMIN 3.5   Recent Labs  Lab 02/27/23 1527  LIPASE 41   No results for  input(s): "AMMONIA" in the last 168 hours. Coagulation Profile: No results for input(s): "INR", "PROTIME" in the last 168 hours. Cardiac Enzymes: No results for input(s): "CKTOTAL", "CKMB", "CKMBINDEX", "TROPONINI" in the last 168 hours. BNP (last 3 results) No results for input(s): "PROBNP" in the last 8760 hours. HbA1C: Recent Labs    02/28/23 0115  HGBA1C 7.7*   CBG: Recent Labs  Lab 02/28/23 1008 02/28/23 1706 02/28/23 2147 03/01/23 0628 03/01/23 1152  GLUCAP 134* 185* 173* 143* 162*   Lipid Profile: No results for input(s): "CHOL", "HDL", "LDLCALC", "TRIG", "CHOLHDL", "LDLDIRECT" in the last 72 hours. Thyroid Function Tests: No results for  input(s): "TSH", "T4TOTAL", "FREET4", "T3FREE", "THYROIDAB" in the last 72 hours. Anemia Panel: No results for input(s): "VITAMINB12", "FOLATE", "FERRITIN", "TIBC", "IRON", "RETICCTPCT" in the last 72 hours. Sepsis Labs: No results for input(s): "PROCALCITON", "LATICACIDVEN" in the last 168 hours.  Recent Results (from the past 240 hour(s))  SARS Coronavirus 2 by RT PCR (hospital order, performed in [facility] hospital lab) *cepheid single result test* Anterior Nasal Swab     Status: None   Collection Time: 02/27/23  7:16 PM   Specimen: Anterior Nasal Swab  Result Value Ref Range Status   SARS Coronavirus 2 by RT PCR NEGATIVE NEGATIVE Final    Comment: Performed at [facility], [address] [place], [place] [address]  Urine Culture     Status: Abnormal   Collection Time: 02/27/23 10:47 PM   Specimen: Urine, Clean Catch  Result Value Ref Range Status   Specimen Description URINE, CLEAN CATCH  Final   Special Requests   Final    Immunocompromised Performed at [facility], [address] [place], [place] [address]    Culture (A)  Final    50,000 COLONIES/mL ESCHERICHIA COLI Confirmed Extended Spectrum Beta-Lactamase Producer (ESBL).  In bloodstream infections from ESBL organisms, carbapenems are preferred over  piperacillin/tazobactam. They are shown to have a lower risk of mortality.    Report Status 03/01/2023 FINAL  Final   Organism ID, Bacteria ESCHERICHIA COLI (A)  Final      Susceptibility   Escherichia coli - MIC*    AMPICILLIN >=32 RESISTANT Resistant     CEFAZOLIN >=64 RESISTANT Resistant     CEFEPIME 16 RESISTANT Resistant     CEFTRIAXONE >=64 RESISTANT Resistant     CIPROFLOXACIN 1 RESISTANT Resistant     GENTAMICIN >=16 RESISTANT Resistant     IMIPENEM <=0.25 SENSITIVE Sensitive     NITROFURANTOIN <=16 SENSITIVE Sensitive     TRIMETH/SULFA >=320 RESISTANT Resistant     AMPICILLIN/SULBACTAM >=32 RESISTANT Resistant     PIP/TAZO 16 SENSITIVE Sensitive     * 50,000 COLONIES/mL ESCHERICHIA COLI  Blood culture (routine x 2)     Status: None (Preliminary result)   Collection Time: 02/27/23 11:00 PM   Specimen: BLOOD  Result Value Ref Range Status   Specimen Description BLOOD SITE NOT SPECIFIED  Final   Special Requests   Final    BOTTLES DRAWN AEROBIC AND ANAEROBIC Blood Culture adequate volume   Culture   Final    NO GROWTH 2 DAYS Performed at [facility], [address] [place], [place] [address]    Report Status PENDING  Incomplete  Blood culture (routine x 2)     Status: None (Preliminary result)   Collection Time: 02/28/23 10:08 AM   Specimen: BLOOD  Result Value Ref Range Status   Specimen Description BLOOD SITE NOT SPECIFIED  Final   Special Requests   Final    BOTTLES DRAWN AEROBIC AND ANAEROBIC Blood Culture results may not be optimal due to an excessive volume of blood received in culture bottles   Culture   Final    NO GROWTH < 24 HOURS Performed at [facility], [address] [place], [place] [address]    Report Status PENDING  Incomplete         Radiology Studies: VAS [place] LOWER EXTREMITY VENOUS (DVT) (ONLY [facility] & [facility])  Result Date: 02/28/2023  Lower Venous DVT Study Patient Name:  [name]  Date of Exam:  02/27/2023 Medical Rec #:  [id number]        Accession #:    [id number] Date of Birth: 03-30-1961        Patient Gender: F Patient Age:   73 years Exam Location:  [facility] Procedure:      VAS [place] LOWER EXTREMITY VENOUS (DVT) Referring Phys: BRITNI HENDERLY --------------------------------------------------------------------------------  Indications: Swelling, and Patient had ankle surgery December 2023. Bedrest until February. Placed in cam walker in February, just released from wearing cam walker within the last month.  Limitations: Pain with compression. Unable to turn lights off in room. Comparison Study: Prior right LEV done at [facility] 09/10/2022. Negative for                   DVT. Performing Technologist: Sherren Kerns RVS  Examination Guidelines: A complete evaluation includes B-mode imaging, spectral Doppler, color Doppler, and power Doppler as needed of all accessible portions of each vessel. Bilateral testing is considered an integral part of a complete examination. Limited examinations for reoccurring indications may be performed as noted. The reflux portion of the exam is performed with the patient in reverse Trendelenburg.  +---------+---------------+---------+-----------+----------+-------------------+ RIGHT    CompressibilityPhasicitySpontaneityPropertiesThrombus Aging      +---------+---------------+---------+-----------+----------+-------------------+ CFV      Full           Yes      Yes                                      +---------+---------------+---------+-----------+----------+-------------------+ SFJ      Full                                                             +---------+---------------+---------+-----------+----------+-------------------+ FV Prox  Full           Yes                                               +---------+---------------+---------+-----------+----------+-------------------+ FV Mid   Full                                                              +---------+---------------+---------+-----------+----------+-------------------+ FV DistalFull           Yes      Yes                                      +---------+---------------+---------+-----------+----------+-------------------+ PFV      Full           Yes      Yes                                      +---------+---------------+---------+-----------+----------+-------------------+ POP      Full  Yes      Yes                                      +---------+---------------+---------+-----------+----------+-------------------+ PTV      Full                                                             +---------+---------------+---------+-----------+----------+-------------------+ PERO                                                  Not well visualized +---------+---------------+---------+-----------+----------+-------------------+ Gastroc                 Yes      Yes                                      +---------+---------------+---------+-----------+----------+-------------------+   +----+---------------+---------+-----------+----------+--------------+ LEFTCompressibilityPhasicitySpontaneityPropertiesThrombus Aging +----+---------------+---------+-----------+----------+--------------+ CFV Full           Yes      Yes                                 +----+---------------+---------+-----------+----------+--------------+     Summary: RIGHT: - There is no evidence of deep vein thrombosis in the lower extremity.  - No cystic structure found in the popliteal fossa.  LEFT: - No evidence of common femoral vein obstruction.  *See table(s) above for measurements and observations. Electronically signed by Lemar Livings MD on 02/28/2023 at 2:30:43 PM.    Final    ECHOCARDIOGRAM COMPLETE  Result Date: 02/28/2023    ECHOCARDIOGRAM REPORT   Patient Name:   [name] Date of Exam: 02/28/2023 Medical Rec #:  [id number]       Height:       62.0 in Accession  #:    [id number]      Weight:       187.0 lb Date of Birth:  1961-05-27       BSA:          1.858 m Patient Age:    62 years        BP:           152/72 mmHg Patient Gender: F               HR:           67 bpm. Exam Location:  Inpatient Procedure: 2D Echo, Cardiac Doppler and Color Doppler Indications:    r/o acute infarction  History:        Patient has prior history of Echocardiogram examinations, most                 recent 04/26/2022. Prior CABG, PAD, Signs/Symptoms:Shortness of                 Breath; Risk Factors:Hypertension, Diabetes and Dyslipidemia.  Sonographer:    Melissa Morford RDCS (AE, PE) Referring Phys: [id number] JARED M GARDNER IMPRESSIONS  1. Left ventricular ejection  fraction, by estimation, is 60 to 65%. The left ventricle has normal function. The left ventricle has no regional wall motion abnormalities. There is mild concentric left ventricular hypertrophy. Left ventricular diastolic parameters are consistent with Grade II diastolic dysfunction (pseudonormalization).  2. Right ventricular systolic function is normal. The right ventricular size is normal.  3. The mitral valve is grossly normal. No evidence of mitral valve regurgitation. No evidence of mitral stenosis.  4. The aortic valve is tricuspid. There is mild calcification of the aortic valve. There is mild thickening of the aortic valve. Aortic valve regurgitation is not visualized. Aortic valve sclerosis/calcification is present, without any evidence of aortic stenosis.  5. The inferior vena cava is normal in size with greater than 50% respiratory variability, suggesting right atrial pressure of 3 mmHg. Comparison(s): No significant change from prior study. FINDINGS  Left Ventricle: Left ventricular ejection fraction, by estimation, is 60 to 65%. The left ventricle has normal function. The left ventricle has no regional wall motion abnormalities. The left ventricular internal cavity size was normal in size. There is  mild concentric left  ventricular hypertrophy. Left ventricular diastolic parameters are consistent with Grade II diastolic dysfunction (pseudonormalization). Right Ventricle: The right ventricular size is normal. Right vetricular wall thickness was not well visualized. Right ventricular systolic function is normal. Left [facility]: Left atrial size was normal in size. Right [facility]: Right atrial size was normal in size. Pericardium: There is no evidence of pericardial effusion. Mitral Valve: The mitral valve is grossly normal. Mild to moderate mitral annular calcification. No evidence of mitral valve regurgitation. No evidence of mitral valve stenosis. Tricuspid Valve: The tricuspid valve is normal in structure. Tricuspid valve regurgitation is trivial. Aortic Valve: The aortic valve is tricuspid. There is mild calcification of the aortic valve. There is mild thickening of the aortic valve. Aortic valve regurgitation is not visualized. Aortic valve sclerosis/calcification is present, without any evidence of aortic stenosis. Pulmonic Valve: The pulmonic valve was normal in structure. Pulmonic valve regurgitation is trivial. Aorta: The aortic root and ascending aorta are structurally normal, with no evidence of dilitation. Venous: The inferior vena cava is normal in size with greater than 50% respiratory variability, suggesting right atrial pressure of 3 mmHg. IAS/Shunts: The atrial septum is grossly normal.  LEFT VENTRICLE PLAX 2D LVIDd:         3.90 cm   Diastology LVIDs:         2.80 cm   LV e' medial:    4.57 cm/s LV PW:         1.20 cm   LV E/e' medial:  23.2 LV IVS:        1.40 cm   LV e' lateral:   6.74 cm/s LVOT diam:     2.10 cm   LV E/e' lateral: 15.7 LV SV:         84 LV SV Index:   45 LVOT Area:     3.46 cm  RIGHT VENTRICLE RV S prime:     8.49 cm/s TAPSE (M-mode): 1.4 cm LEFT ATRIUM             Index        RIGHT ATRIUM           Index LA diam:        3.80 cm 2.05 cm/m   RA Area:     19.50 cm LA Vol (A2C):   39.1 ml 21.05 ml/m   RA Volume:   53.00 ml  28.53 ml/m LA Vol (A4C):   45.0 ml 24.22 ml/m LA Biplane Vol: 42.0 ml 22.61 ml/m  AORTIC VALVE LVOT Vmax:   95.80 cm/s LVOT Vmean:  75.000 cm/s LVOT VTI:    0.242 m  AORTA Ao Root diam: 2.40 cm Ao Asc diam:  2.80 cm MITRAL VALVE                TRICUSPID VALVE MV Area (PHT): 3.61 cm     TR Peak grad:   14.9 mmHg MV E velocity: 106.00 cm/s  TR Vmax:        193.00 cm/s MV A velocity: 85.90 cm/s MV E/A ratio:  1.23         SHUNTS                             Systemic VTI:  0.24 m                             Systemic Diam: 2.10 cm Laurance Flatten MD Electronically signed by Laurance Flatten MD Signature Date/Time: 02/28/2023/11:15:03 AM    Final    CT Angio Chest PE W and/or Wo Contrast  Result Date: 02/27/2023 CLINICAL DATA:  Epigastric pain, chest pain, abdominal pain, emesis and shortness of breath. History of DVT. EXAM: CT ANGIOGRAPHY CHEST CT ABDOMEN AND PELVIS WITH CONTRAST TECHNIQUE: Multidetector CT imaging of the chest was performed using the standard protocol during bolus administration of intravenous contrast. Multiplanar CT image reconstructions and MIPs were obtained to evaluate the vascular anatomy. Multidetector CT imaging of the abdomen and pelvis was performed using the standard protocol during bolus administration of intravenous contrast. RADIATION DOSE REDUCTION: This exam was performed according to the departmental dose-optimization program which includes automated exposure control, adjustment of the mA and/or kV according to patient size and/or use of iterative reconstruction technique. CONTRAST:  60mL OMNIPAQUE IOHEXOL 350 MG/ML SOLN COMPARISON:  CTA chest 05/11/2022 and CT renal stone protocol 05/10/2022 FINDINGS: CTA CHEST FINDINGS Cardiovascular: Sternotomy and CABG. Coronary artery and aortic atherosclerotic calcification. Cardiomegaly. No pericardial effusion. Satisfactory opacification of the pulmonary arteries to the segmental level. No pulmonary embolism.  Mediastinum/Nodes: Unremarkable trachea and esophagus. No thoracic adenopathy. Lungs/Pleura: Mild diffuse interlobular septal thickening and patchy ground-glass opacities greatest in the lower lungs, improved compared with 05/11/2022. Bibasilar atelectasis/scarring. No pleural effusion or pneumothorax. Musculoskeletal: No acute fracture. Review of the MIP images confirms the above findings. CT ABDOMEN and PELVIS FINDINGS Hepatobiliary: Unremarkable liver, gallbladder, and biliary tree. Pancreas: Unremarkable. Spleen: Unremarkable. Adrenals/Urinary Tract: Normal adrenal glands. Atrophic native kidneys. Right lower quadrant renal allograft no urinary calculi or hydronephrosis. Unremarkable bladder. Areas of cortical geographic hypoattenuation within the renal transplant suspicious for pyelonephritis. Stomach/Bowel: Normal caliber large and small bowel. Colonic diverticulosis without diverticulitis. Normal appendix. Stomach is within normal limits. Vascular/Lymphatic: Aortic atherosclerosis. No enlarged abdominal or pelvic lymph nodes. Reproductive: Hysterectomy. Other: No free intraperitoneal fluid or air. 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 Superior endplate compression deformity of T10 is presumed chronic. No acute fracture. Review of the MIP images confirms the above findings. IMPRESSION: 1. Negative for acute pulmonary embolism. 2. Findings suggestive of congestive heart failure with mild pulmonary edema. 3. Areas of geographic hypoattenuation within the right lower quadrant renal transplant suspicious for pyelonephritis. 4. 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 Electronically Signed  By: Minerva Fester M.D.   On: 02/27/2023 21:46   CT ABDOMEN PELVIS W CONTRAST  Result Date: 02/27/2023 CLINICAL DATA:  Epigastric pain, chest pain, abdominal pain, emesis and shortness of breath. History of DVT. EXAM: CT  ANGIOGRAPHY CHEST CT ABDOMEN AND PELVIS WITH CONTRAST TECHNIQUE: Multidetector CT imaging of the chest was performed using the standard protocol during bolus administration of intravenous contrast. Multiplanar CT image reconstructions and MIPs were obtained to evaluate the vascular anatomy. Multidetector CT imaging of the abdomen and pelvis was performed using the standard protocol during bolus administration of intravenous contrast. RADIATION DOSE REDUCTION: This exam was performed according to the departmental dose-optimization program which includes automated exposure control, adjustment of the mA and/or kV according to patient size and/or use of iterative reconstruction technique. CONTRAST:  60mL OMNIPAQUE IOHEXOL 350 MG/ML SOLN COMPARISON:  CTA chest 05/11/2022 and CT renal stone protocol 05/10/2022 FINDINGS: CTA CHEST FINDINGS Cardiovascular: Sternotomy and CABG. Coronary artery and aortic atherosclerotic calcification. Cardiomegaly. No pericardial effusion. Satisfactory opacification of the pulmonary arteries to the segmental level. No pulmonary embolism. Mediastinum/Nodes: Unremarkable trachea and esophagus. No thoracic adenopathy. Lungs/Pleura: Mild diffuse interlobular septal thickening and patchy ground-glass opacities greatest in the lower lungs, improved compared with 05/11/2022. Bibasilar atelectasis/scarring. No pleural effusion or pneumothorax. Musculoskeletal: No acute fracture. Review of the MIP images confirms the above findings. CT ABDOMEN and PELVIS FINDINGS Hepatobiliary: Unremarkable liver, gallbladder, and biliary tree. Pancreas: Unremarkable. Spleen: Unremarkable. Adrenals/Urinary Tract: Normal adrenal glands. Atrophic native kidneys. Right lower quadrant renal allograft no urinary calculi or hydronephrosis. Unremarkable bladder. Areas of cortical geographic hypoattenuation within the renal transplant suspicious for pyelonephritis. Stomach/Bowel: Normal caliber large and small bowel. Colonic  diverticulosis without diverticulitis. Normal appendix. Stomach is within normal limits. Vascular/Lymphatic: Aortic atherosclerosis. No enlarged abdominal or pelvic lymph nodes. Reproductive: Hysterectomy. Other: No free intraperitoneal fluid or air. 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 Superior endplate compression deformity of T10 is presumed chronic. No acute fracture. Review of the MIP images confirms the above findings. IMPRESSION: 1. Negative for acute pulmonary embolism. 2. Findings suggestive of congestive heart failure with mild pulmonary edema. 3. Areas of geographic hypoattenuation within the right lower quadrant renal transplant suspicious for pyelonephritis. 4. 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 Electronically Signed   By: Minerva Fester M.D.   On: 02/27/2023 21:46   DG Chest Portable 1 View  Result Date: 02/27/2023 CLINICAL DATA:  Shortness of breath. EXAM: PORTABLE CHEST 1 VIEW COMPARISON:  05/10/2022 FINDINGS: The cardio pericardial silhouette is enlarged. The lungs are clear without focal pneumonia, edema, pneumothorax or pleural effusion. Subtle atelectasis or scarring noted at the left base. Vascular stent device noted in the left axillary region. No acute bony abnormality. Telemetry leads overlie the chest. IMPRESSION: No acute cardiopulmonary findings. Subtle atelectasis or scarring is noted at the left base. Electronically Signed   By: Kennith Center M.D.   On: 02/27/2023 17:59           LOS: 0 days   Time spent= 35 mins    Baeleigh Devincent Joline Maxcy, MD [facility]  If 7PM-7AM, please contact night-coverage  03/01/2023, 12:50 PM

## 2023-03-01 NOTE — Progress Notes (Signed)
Pharmacy Antibiotic Note  [name] is a 62 y.o. female admitted on 02/27/2023 with UTI.  Pharmacy has been consulted for meropenem dosing.  Has been treated with ceftriaxone then cedinir (currently on day #3). WBC 3.9, afebrile. Scr 1.12 (CrCl 52 mL/min). Ucx growing 50k E Coli ESBL - given CT findings 6/9 suspicious for pyelonephritis, will use meropenem (discussed with ID and hospitalist).  Plan: Start meropenem 1g IV every 8 hours Monitor renal fx, cx results, clinical pic  Height: 5\' 2"  (157.5 cm) Weight: 84.1 kg (185 lb 8 oz) IBW/kg (Calculated) : 50.1  Temp (24hrs), Avg:97.7 F (36.5 C), Min:97.5 F (36.4 C), Max:98 F (36.7 C)  Recent Labs  Lab 02/27/23 1527 02/28/23 0115 03/01/23 0052  WBC 4.5 4.0 3.9*  CREATININE 1.33*  --  1.12*    Estimated Creatinine Clearance: 52.4 mL/min (A) (by C-G formula based on SCr of 1.12 mg/dL (H)).    Allergies  Allergen Reactions   Lisinopril Swelling    Swelling of tongue   Hydralazine     Other reaction(s): heart palpitations    Antimicrobials this admission: Ceftriaxone 6/9 Cefdinir 6/10 >> 6/11 Meropenem 6/11 >>  Dose adjustments this admission: N/A  Microbiology results: 6/10 BCx: ngtd 6/9 UCx: 50k Ecoli ESBL    Thank you for allowing pharmacy to participate in this patient's care,  Sherron Monday, PharmD, BCCCP Clinical Pharmacist  Phone: [phone] 03/01/2023 10:17 AM  Please check AMION for all [facility] phone numbers After 10:00 PM, call Main Pharmacy [phone]

## 2023-03-01 NOTE — Progress Notes (Signed)
Rounding Note    Patient Name: [name] Date of Encounter: 03/01/2023  [facility] Cardiologist: None   Subjective   No chest pain this morning. Feels much better today. Wants to go home  Inpatient Medications    Scheduled Meds:  apixaban  5 mg Oral BID   aspirin  81 mg Oral Daily   atorvastatin  20 mg Oral Daily   carvedilol  25 mg Oral BID WC   cefdinir  300 mg Oral BID   cholecalciferol  2,000 Units Oral Daily   gabapentin  300 mg Oral TID   insulin aspart  0-9 Units Subcutaneous TID AC & HS   insulin glargine-yfgn  10 Units Subcutaneous QHS   levothyroxine  100 mcg Oral Q0600   linaclotide  290 mcg Oral QAC breakfast   mycophenolate  360 mg Oral BID   NIFEdipine  60 mg Oral BID   predniSONE  5 mg Oral Daily   sodium chloride flush  3 mL Intravenous Q12H   sulfamethoxazole-trimethoprim  1 tablet Oral Q M,W,F   tacrolimus ER  6 mg Oral Daily   Continuous Infusions:  sodium chloride     PRN Meds: sodium chloride, acetaminophen, albuterol, cyclobenzaprine, famotidine, ondansetron (ZOFRAN) IV, oxyCODONE-acetaminophen, sodium chloride flush   Vital Signs    Vitals:   02/28/23 2050 03/01/23 0137 03/01/23 0504 03/01/23 0750  BP:  (!) 180/60 (!) 149/53 (!) 154/62  Pulse:  70 69 66  Resp:  20 20 18   Temp:  97.6 F (36.4 C) (!) 97.5 F (36.4 C) 97.7 F (36.5 C)  TempSrc:  Oral Oral Oral  SpO2: 94% 95% 96% 96%  Weight:   84.1 kg   Height:        Intake/Output Summary (Last 24 hours) at 03/01/2023 0847 Last data filed at 02/28/2023 2243 Gross per 24 hour  Intake 243 ml  Output 210 ml  Net 33 ml       03/01/2023    5:04 AM 02/28/2023    3:37 PM 02/27/2023    2:39 PM  Last 3 Weights  Weight (lbs) 185 lb 8 oz 181 lb 7 oz 187 lb  Weight (kg) 84.142 kg 82.3 kg 84.823 kg      Telemetry    NSR - Personally Reviewed  ECG    Sinus Rhythm, 89 bpm, no acute change - Personally Reviewed  Physical Exam   GEN: No acute distress.   Neck: No  JVD Cardiac: RRR, no rubs, or gallops. Continuous murmur upper chest from AV fistula in left upper arm Respiratory: clear GI: Soft, nontender, non-distended  MS: No edema; No deformity. Neuro:  Nonfocal  Psych: Normal affect   Labs    High Sensitivity Troponin:   Recent Labs  Lab 02/27/23 2150 02/27/23 2300 02/28/23 0115 02/28/23 1610 02/28/23 1834  TROPONINIHS 216* 220* 228* 121* 116*      Chemistry Recent Labs  Lab 02/27/23 1527 03/01/23 0052  NA 135 134*  K 3.4* 3.8  CL 97* 100  CO2 23 23  GLUCOSE 217* 202*  BUN 33* 19  CREATININE 1.33* 1.12*  CALCIUM 9.5 8.7*  PROT 7.6  --   ALBUMIN 3.5  --   AST 18  --   ALT 13  --   ALKPHOS 82  --   BILITOT 0.4  --   GFRNONAA 45* 56*  ANIONGAP 15 11     Lipids No results for input(s): "CHOL", "TRIG", "HDL", "LABVLDL", "LDLCALC", "CHOLHDL" in the  last 168 hours.  Hematology Recent Labs  Lab 02/27/23 1527 02/28/23 0115 03/01/23 0052  WBC 4.5 4.0 3.9*  RBC 6.02* 5.30* 5.48*  HGB 12.7 11.1* 11.7*  HCT 41.7 36.3 38.5  MCV 69.3* 68.5* 70.3*  MCH 21.1* 20.9* 21.4*  MCHC 30.5 30.6 30.4  RDW 19.3* 18.6* 19.3*  PLT 193 180 179    Thyroid No results for input(s): "TSH", "FREET4" in the last 168 hours.  BNP Recent Labs  Lab 02/27/23 1527  BNP 195.0*     DDimer No results for input(s): "DDIMER" in the last 168 hours.   Radiology    VAS [place] LOWER EXTREMITY VENOUS (DVT) (ONLY [facility] & [facility])  Result Date: 02/28/2023  Lower Venous DVT Study Patient Name:  [name] [facility]  Date of Exam:   02/27/2023 Medical Rec #: [id number]        Accession #:    [id number] Date of Birth: 12-02-1960        Patient Gender: F Patient Age:   62 years Exam Location:  [facility] Procedure:      VAS [place] LOWER EXTREMITY VENOUS (DVT) Referring Phys: Lorenda Cahill HENDERLY --------------------------------------------------------------------------------  Indications: Swelling, and Patient had ankle surgery December 2023. Bedrest until February. Placed in  cam walker in February, just released from wearing cam walker within the last month.  Limitations: Pain with compression. Unable to turn lights off in room. Comparison Study: Prior right LEV done at [facility] 09/10/2022. Negative for                   DVT. Performing Technologist: Sherren Kerns RVS  Examination Guidelines: A complete evaluation includes B-mode imaging, spectral Doppler, color Doppler, and power Doppler as needed of all accessible portions of each vessel. Bilateral testing is considered an integral part of a complete examination. Limited examinations for reoccurring indications may be performed as noted. The reflux portion of the exam is performed with the patient in reverse Trendelenburg.  +---------+---------------+---------+-----------+----------+-------------------+ RIGHT    CompressibilityPhasicitySpontaneityPropertiesThrombus Aging      +---------+---------------+---------+-----------+----------+-------------------+ CFV      Full           Yes      Yes                                      +---------+---------------+---------+-----------+----------+-------------------+ SFJ      Full                                                             +---------+---------------+---------+-----------+----------+-------------------+ FV Prox  Full           Yes                                               +---------+---------------+---------+-----------+----------+-------------------+ FV Mid   Full                                                             +---------+---------------+---------+-----------+----------+-------------------+  FV DistalFull           Yes      Yes                                      +---------+---------------+---------+-----------+----------+-------------------+ PFV      Full           Yes      Yes                                      +---------+---------------+---------+-----------+----------+-------------------+ POP      Full            Yes      Yes                                      +---------+---------------+---------+-----------+----------+-------------------+ PTV      Full                                                             +---------+---------------+---------+-----------+----------+-------------------+ PERO                                                  Not well visualized +---------+---------------+---------+-----------+----------+-------------------+ Gastroc                 Yes      Yes                                      +---------+---------------+---------+-----------+----------+-------------------+   +----+---------------+---------+-----------+----------+--------------+ LEFTCompressibilityPhasicitySpontaneityPropertiesThrombus Aging +----+---------------+---------+-----------+----------+--------------+ CFV Full           Yes      Yes                                 +----+---------------+---------+-----------+----------+--------------+     Summary: RIGHT: - There is no evidence of deep vein thrombosis in the lower extremity.  - No cystic structure found in the popliteal fossa.  LEFT: - No evidence of common femoral vein obstruction.  *See table(s) above for measurements and observations. Electronically signed by Lemar Livings MD on 02/28/2023 at 2:30:43 PM.    Final    ECHOCARDIOGRAM COMPLETE  Result Date: 02/28/2023    ECHOCARDIOGRAM REPORT   Patient Name:   [name] Date of Exam: 02/28/2023 Medical Rec #:  [id number]       Height:       62.0 in Accession #:    [id number]      Weight:       187.0 lb Date of Birth:  05-26-1961       BSA:          1.858 m Patient Age:    62 years        BP:  152/72 mmHg Patient Gender: F               HR:           67 bpm. Exam Location:  Inpatient Procedure: 2D Echo, Cardiac Doppler and Color Doppler Indications:    r/o acute infarction  History:        Patient has prior history of Echocardiogram examinations, most                  recent 04/26/2022. Prior CABG, PAD, Signs/Symptoms:Shortness of                 Breath; Risk Factors:Hypertension, Diabetes and Dyslipidemia.  Sonographer:    Melissa Morford RDCS (AE, PE) Referring Phys: [id number] JARED M GARDNER IMPRESSIONS  1. Left ventricular ejection fraction, by estimation, is 60 to 65%. The left ventricle has normal function. The left ventricle has no regional wall motion abnormalities. There is mild concentric left ventricular hypertrophy. Left ventricular diastolic parameters are consistent with Grade II diastolic dysfunction (pseudonormalization).  2. Right ventricular systolic function is normal. The right ventricular size is normal.  3. The mitral valve is grossly normal. No evidence of mitral valve regurgitation. No evidence of mitral stenosis.  4. The aortic valve is tricuspid. There is mild calcification of the aortic valve. There is mild thickening of the aortic valve. Aortic valve regurgitation is not visualized. Aortic valve sclerosis/calcification is present, without any evidence of aortic stenosis.  5. The inferior vena cava is normal in size with greater than 50% respiratory variability, suggesting right atrial pressure of 3 mmHg. Comparison(s): No significant change from prior study. FINDINGS  Left Ventricle: Left ventricular ejection fraction, by estimation, is 60 to 65%. The left ventricle has normal function. The left ventricle has no regional wall motion abnormalities. The left ventricular internal cavity size was normal in size. There is  mild concentric left ventricular hypertrophy. Left ventricular diastolic parameters are consistent with Grade II diastolic dysfunction (pseudonormalization). Right Ventricle: The right ventricular size is normal. Right vetricular wall thickness was not well visualized. Right ventricular systolic function is normal. Left Atrium: Left atrial size was normal in size. Right Atrium: Right atrial size was normal in size. Pericardium: There is no  evidence of pericardial effusion. Mitral Valve: The mitral valve is grossly normal. Mild to moderate mitral annular calcification. No evidence of mitral valve regurgitation. No evidence of mitral valve stenosis. Tricuspid Valve: The tricuspid valve is normal in structure. Tricuspid valve regurgitation is trivial. Aortic Valve: The aortic valve is tricuspid. There is mild calcification of the aortic valve. There is mild thickening of the aortic valve. Aortic valve regurgitation is not visualized. Aortic valve sclerosis/calcification is present, without any evidence of aortic stenosis. Pulmonic Valve: The pulmonic valve was normal in structure. Pulmonic valve regurgitation is trivial. Aorta: The aortic root and ascending aorta are structurally normal, with no evidence of dilitation. Venous: The inferior vena cava is normal in size with greater than 50% respiratory variability, suggesting right atrial pressure of 3 mmHg. IAS/Shunts: The atrial septum is grossly normal.  LEFT VENTRICLE PLAX 2D LVIDd:         3.90 cm   Diastology LVIDs:         2.80 cm   LV e' medial:    4.57 cm/s LV PW:         1.20 cm   LV E/e' medial:  23.2 LV IVS:        1.40 cm   LV e'  lateral:   6.74 cm/s LVOT diam:     2.10 cm   LV E/e' lateral: 15.7 LV SV:         84 LV SV Index:   45 LVOT Area:     3.46 cm  RIGHT VENTRICLE RV S prime:     8.49 cm/s TAPSE (M-mode): 1.4 cm LEFT ATRIUM             Index        RIGHT ATRIUM           Index LA diam:        3.80 cm 2.05 cm/m   RA Area:     19.50 cm LA Vol (A2C):   39.1 ml 21.05 ml/m  RA Volume:   53.00 ml  28.53 ml/m LA Vol (A4C):   45.0 ml 24.22 ml/m LA Biplane Vol: 42.0 ml 22.61 ml/m  AORTIC VALVE LVOT Vmax:   95.80 cm/s LVOT Vmean:  75.000 cm/s LVOT VTI:    0.242 m  AORTA Ao Root diam: 2.40 cm Ao Asc diam:  2.80 cm MITRAL VALVE                TRICUSPID VALVE MV Area (PHT): 3.61 cm     TR Peak grad:   14.9 mmHg MV E velocity: 106.00 cm/s  TR Vmax:        193.00 cm/s MV A velocity: 85.90 cm/s  MV E/A ratio:  1.23         SHUNTS                             Systemic VTI:  0.24 m                             Systemic Diam: 2.10 cm Laurance Flatten MD Electronically signed by Laurance Flatten MD Signature Date/Time: 02/28/2023/11:15:03 AM    Final    CT Angio Chest PE W and/or Wo Contrast  Result Date: 02/27/2023 CLINICAL DATA:  Epigastric pain, chest pain, abdominal pain, emesis and shortness of breath. History of DVT. EXAM: CT ANGIOGRAPHY CHEST CT ABDOMEN AND PELVIS WITH CONTRAST TECHNIQUE: Multidetector CT imaging of the chest was performed using the standard protocol during bolus administration of intravenous contrast. Multiplanar CT image reconstructions and MIPs were obtained to evaluate the vascular anatomy. Multidetector CT imaging of the abdomen and pelvis was performed using the standard protocol during bolus administration of intravenous contrast. RADIATION DOSE REDUCTION: This exam was performed according to the departmental dose-optimization program which includes automated exposure control, adjustment of the mA and/or kV according to patient size and/or use of iterative reconstruction technique. CONTRAST:  60mL OMNIPAQUE IOHEXOL 350 MG/ML SOLN COMPARISON:  CTA chest 05/11/2022 and CT renal stone protocol 05/10/2022 FINDINGS: CTA CHEST FINDINGS Cardiovascular: Sternotomy and CABG. Coronary artery and aortic atherosclerotic calcification. Cardiomegaly. No pericardial effusion. Satisfactory opacification of the pulmonary arteries to the segmental level. No pulmonary embolism. Mediastinum/Nodes: Unremarkable trachea and esophagus. No thoracic adenopathy. Lungs/Pleura: Mild diffuse interlobular septal thickening and patchy ground-glass opacities greatest in the lower lungs, improved compared with 05/11/2022. Bibasilar atelectasis/scarring. No pleural effusion or pneumothorax. Musculoskeletal: No acute fracture. Review of the MIP images confirms the above findings. CT ABDOMEN and PELVIS FINDINGS  Hepatobiliary: Unremarkable liver, gallbladder, and biliary tree. Pancreas: Unremarkable. Spleen: Unremarkable. Adrenals/Urinary Tract: Normal adrenal glands. Atrophic native kidneys. Right lower quadrant renal allograft no urinary calculi or  hydronephrosis. Unremarkable bladder. Areas of cortical geographic hypoattenuation within the renal transplant suspicious for pyelonephritis. Stomach/Bowel: Normal caliber large and small bowel. Colonic diverticulosis without diverticulitis. Normal appendix. Stomach is within normal limits. Vascular/Lymphatic: Aortic atherosclerosis. No enlarged abdominal or pelvic lymph nodes. Reproductive: Hysterectomy. Other: No free intraperitoneal fluid or air. 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 Superior endplate compression deformity of T10 is presumed chronic. No acute fracture. Review of the MIP images confirms the above findings. IMPRESSION: 1. Negative for acute pulmonary embolism. 2. Findings suggestive of congestive heart failure with mild pulmonary edema. 3. Areas of geographic hypoattenuation within the right lower quadrant renal transplant suspicious for pyelonephritis. 4. 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 Electronically Signed   By: Minerva Fester M.D.   On: 02/27/2023 21:46   CT ABDOMEN PELVIS W CONTRAST  Result Date: 02/27/2023 CLINICAL DATA:  Epigastric pain, chest pain, abdominal pain, emesis and shortness of breath. History of DVT. EXAM: CT ANGIOGRAPHY CHEST CT ABDOMEN AND PELVIS WITH CONTRAST TECHNIQUE: Multidetector CT imaging of the chest was performed using the standard protocol during bolus administration of intravenous contrast. Multiplanar CT image reconstructions and MIPs were obtained to evaluate the vascular anatomy. Multidetector CT imaging of the abdomen and pelvis was performed using the standard protocol during bolus administration of intravenous  contrast. RADIATION DOSE REDUCTION: This exam was performed according to the departmental dose-optimization program which includes automated exposure control, adjustment of the mA and/or kV according to patient size and/or use of iterative reconstruction technique. CONTRAST:  60mL OMNIPAQUE IOHEXOL 350 MG/ML SOLN COMPARISON:  CTA chest 05/11/2022 and CT renal stone protocol 05/10/2022 FINDINGS: CTA CHEST FINDINGS Cardiovascular: Sternotomy and CABG. Coronary artery and aortic atherosclerotic calcification. Cardiomegaly. No pericardial effusion. Satisfactory opacification of the pulmonary arteries to the segmental level. No pulmonary embolism. Mediastinum/Nodes: Unremarkable trachea and esophagus. No thoracic adenopathy. Lungs/Pleura: Mild diffuse interlobular septal thickening and patchy ground-glass opacities greatest in the lower lungs, improved compared with 05/11/2022. Bibasilar atelectasis/scarring. No pleural effusion or pneumothorax. Musculoskeletal: No acute fracture. Review of the MIP images confirms the above findings. CT ABDOMEN and PELVIS FINDINGS Hepatobiliary: Unremarkable liver, gallbladder, and biliary tree. Pancreas: Unremarkable. Spleen: Unremarkable. Adrenals/Urinary Tract: Normal adrenal glands. Atrophic native kidneys. Right lower quadrant renal allograft no urinary calculi or hydronephrosis. Unremarkable bladder. Areas of cortical geographic hypoattenuation within the renal transplant suspicious for pyelonephritis. Stomach/Bowel: Normal caliber large and small bowel. Colonic diverticulosis without diverticulitis. Normal appendix. Stomach is within normal limits. Vascular/Lymphatic: Aortic atherosclerosis. No enlarged abdominal or pelvic lymph nodes. Reproductive: Hysterectomy. Other: No free intraperitoneal fluid or air. 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 Superior endplate compression deformity of T10 is presumed chronic. No acute  fracture. Review of the MIP images confirms the above findings. IMPRESSION: 1. Negative for acute pulmonary embolism. 2. Findings suggestive of congestive heart failure with mild pulmonary edema. 3. Areas of geographic hypoattenuation within the right lower quadrant renal transplant suspicious for pyelonephritis. 4. 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 Electronically Signed   By: Minerva Fester M.D.   On: 02/27/2023 21:46   DG Chest Portable 1 View  Result Date: 02/27/2023 CLINICAL DATA:  Shortness of breath. EXAM: PORTABLE CHEST 1 VIEW COMPARISON:  05/10/2022 FINDINGS: The cardio pericardial silhouette is enlarged. The lungs are clear without focal pneumonia, edema, pneumothorax or pleural effusion. Subtle atelectasis or scarring noted at the left  base. Vascular stent device noted in the left axillary region. No acute bony abnormality. Telemetry leads overlie the chest. IMPRESSION: No acute cardiopulmonary findings. Subtle atelectasis or scarring is noted at the left base. Electronically Signed   By: Kennith Center M.D.   On: 02/27/2023 17:59    Cardiac Studies   Echo: IMPRESSIONS     1. Left ventricular ejection fraction, by estimation, is 60 to 65%. The  left ventricle has normal function. The left ventricle has no regional  wall motion abnormalities. There is mild concentric left ventricular  hypertrophy. Left ventricular diastolic  parameters are consistent with Grade II diastolic dysfunction  (pseudonormalization).   2. Right ventricular systolic function is normal. The right ventricular  size is normal.   3. The mitral valve is grossly normal. No evidence of mitral valve  regurgitation. No evidence of mitral stenosis.   4. The aortic valve is tricuspid. There is mild calcification of the  aortic valve. There is mild thickening of the aortic valve. Aortic valve  regurgitation is not visualized. Aortic valve sclerosis/calcification is  present,  without any evidence of  aortic stenosis.   5. The inferior vena cava is normal in size with greater than 50%  respiratory variability, suggesting right atrial pressure of 3 mmHg.   Comparison(s): No significant change from prior study.    Patient Profile     62 y.o. female with PMH of CAD s/p 3v CABG' 2023 (LIMA to LAD, SVG to OM, SVG to PDA), ESRD s/p renal transplant, cardiac arrest, DM, HTN, AVN of femoral heads, DVT on Eliquis and freq UTI who presented to ED with complaints of chest pain.   Assessment & Plan     Elevated troponin CAD s/p CABG '2023 -- presented with abd pain for the past couple of day. Notes more of a fluttering type sensation in her chest yesterday prompting presentation to the ED.  -- hsTn 242>>216>>220>>228, EKG without ischemic changes -- I have a low index of suspicion for ACS given lack of significant chest pain. No Ecg changes. Flat troponin trend. Similar findings when admitted in Dec in [place]. Echo shows normal LV function.  At this point would manage infection as needed.  I would not recommend further ischemic evaluation this admission.  -- ok for DC from a cardiac standpoint.  --can follow up with her cardiologist in [place]  Chronic HFpEF -- reports feeling volume overloaded over the past couple of days -- BNP 195 -- CT angio with no PE, pulmonary edema, mild crackles in bases -- on torsemide 40mg  BID PTA- continue   ESRD s/p renal transplant Recent pyelonephritis  -- CT abd suggestive of pyelonephritis but UA clear on admission, normal WBC and afebrile  --urine culture with 50K E coli multiply resistant. Blood cultures negative -- per primary  Pulmonary fibrosis -- uses home O2 as needed during the day, constant at night -- feels she has been needed to use more freq  Hx of DVT -- on Eliquis chronically  DM -- Hgb A1c 7.7 -- on SSI  HLD -- on statin  HTN -- continue coreg 25mg  BID,nifedipine 60mg  BID  -- consider  adding hydralazine if BP remains elevated  Hypokalemia -- K+ 3.4, supp>>3.8   [facility] will sign off.   Medication Recommendations:  see above and MAR Other recommendations (labs, testing, etc):  none Follow up as an outpatient:  with primary cardiologist in [place].    For questions or updates, please contact  [facility]  Please consult [email or web address] for contact info under        Signed, Harla Mensch [place], MD  03/01/2023, 8:47 AM

## 2023-03-02 ENCOUNTER — Other Ambulatory Visit: Payer: Self-pay

## 2023-03-02 DIAGNOSIS — B9620 Unspecified Escherichia coli [E. coli] as the cause of diseases classified elsewhere: Secondary | ICD-10-CM | POA: Diagnosis not present

## 2023-03-02 DIAGNOSIS — N12 Tubulo-interstitial nephritis, not specified as acute or chronic: Secondary | ICD-10-CM

## 2023-03-02 DIAGNOSIS — N390 Urinary tract infection, site not specified: Secondary | ICD-10-CM | POA: Diagnosis not present

## 2023-03-02 DIAGNOSIS — R079 Chest pain, unspecified: Secondary | ICD-10-CM | POA: Diagnosis not present

## 2023-03-02 DIAGNOSIS — Z1612 Extended spectrum beta lactamase (ESBL) resistance: Secondary | ICD-10-CM | POA: Diagnosis not present

## 2023-03-02 LAB — BASIC METABOLIC PANEL
Anion gap: 9 (ref 5–15)
BUN: 20 mg/dL (ref 8–23)
CO2: 27 mmol/L (ref 22–32)
Calcium: 9.2 mg/dL (ref 8.9–10.3)
Chloride: 101 mmol/L (ref 98–111)
Creatinine, Ser: 1.09 mg/dL — ABNORMAL HIGH (ref 0.44–1.00)
GFR, Estimated: 57 mL/min — ABNORMAL LOW (ref >60)
Glucose, Bld: 195 mg/dL — ABNORMAL HIGH (ref 70–99)
Potassium: 4.3 mmol/L (ref 3.5–5.1)
Sodium: 137 mmol/L (ref 135–145)

## 2023-03-02 LAB — GLUCOSE, CAPILLARY
Glucose-Capillary: 164 mg/dL — ABNORMAL HIGH (ref 70–99)
Glucose-Capillary: 160 mg/dL — ABNORMAL HIGH (ref 70–99)
Glucose-Capillary: 229 mg/dL — ABNORMAL HIGH (ref 70–99)
Glucose-Capillary: 292 mg/dL — ABNORMAL HIGH (ref 70–99)

## 2023-03-02 LAB — CBC
HCT: 39.1 % (ref 36.0–46.0)
Hemoglobin: 11.9 g/dL — ABNORMAL LOW (ref 12.0–15.0)
MCH: 21.1 pg — ABNORMAL LOW (ref 26.0–34.0)
MCHC: 30.4 g/dL (ref 30.0–36.0)
MCV: 69.3 fL — ABNORMAL LOW (ref 80.0–100.0)
Platelets: 213 10*3/uL (ref 150–400)
RBC: 5.64 MIL/uL — ABNORMAL HIGH (ref 3.87–5.11)
RDW: 19.5 % — ABNORMAL HIGH (ref 11.5–15.5)
WBC: 4.8 10*3/uL (ref 4.0–10.5)
nRBC: 0 % (ref 0.0–0.2)

## 2023-03-02 MED ORDER — INSULIN GLARGINE-YFGN 100 UNIT/ML ~~LOC~~ SOLN
15.0000 [IU] | Freq: Every day | SUBCUTANEOUS | Status: DC
  Administered 2023-03-02: 15 [IU] via SUBCUTANEOUS
  Filled 2023-03-02 (×2): qty 0.15

## 2023-03-02 NOTE — Progress Notes (Signed)
Physical Therapy Treatment Patient Details Name: [name] MRN: [id number] DOB: 04-15-1961 Today's Date: 03/02/2023   History of Present Illness 62 y.o. female presents to [facility] on 02/27/2023 with fatigue and intermittent chest tightness. Of note pt recently battling UTI/pyelonephritis. PMH: chronic respiratory failure and is on 3 L at home since secondary to a COVID-19 infection; renal transplant is on immunosuppressant; HTN; DVTs, DM, ESRD on HD, PN, PAD, CABG.    PT Comments    Pt tolerated today's session fairly well but remains limited by B hip pain. Pt agreeable to sitting EOB, eventually motivated to attempt ambulating in the room, able to ambulate ~5 feet forwards and backwards with RW and close supervision for safety. Pt educated on technique and safety as well as discussing HEP to perform prior to eventual THR. Pt reports good support at home and has established HHPT hopeful to return home and continue to progress mobility as her pain allows. Discharge recommendations remain appropriate at this time, acute PT will continue to follow during admission for progression of mobility as pt tolerates.     Recommendations for follow up therapy are one component of a multi-disciplinary discharge planning process, led by the attending physician.  Recommendations may be updated based on patient status, additional functional criteria and insurance authorization.  Follow Up Recommendations       Assistance Recommended at Discharge Intermittent Supervision/Assistance  Patient can return home with the following A little help with walking and/or transfers;A little help with bathing/dressing/bathroom;Assistance with cooking/housework;Assist for transportation;Help with stairs or ramp for entrance   Equipment Recommendations  None recommended by PT (owns necessary DME)    Recommendations for Other Services       Precautions / Restrictions Precautions Precautions: Fall Precaution  Comments: bilateral hip OA limiting mobility at baseline Restrictions Weight Bearing Restrictions: No     Mobility  Bed Mobility Overal bed mobility: Needs Assistance Bed Mobility: Supine to Sit     Supine to sit: Modified independent (Device/Increase time), HOB elevated     General bed mobility comments: increased time with HOB elevated and use of bed rail, pt utilizing BUE to manage BLE. Ended session with pt sitting EOB    Transfers Overall transfer level: Needs assistance Equipment used: Rolling walker (2 wheels) Transfers: Sit to/from Stand Sit to Stand: Supervision           General transfer comment: close supervision for safety, pt with proper placement of hands to power up    Ambulation/Gait Ambulation/Gait assistance: Supervision Gait Distance (Feet): 5 Feet (forward and 5 feet backwards) Assistive device: Rolling walker (2 wheels) Gait Pattern/deviations: Step-to pattern, Decreased step length - left, Decreased step length - right, Decreased dorsiflexion - left, Decreased dorsiflexion - right, Shuffle, Antalgic, Trunk flexed Gait velocity: decreased     General Gait Details: ambulating briefly in room ~5 feet forwards and 5 feet backwards, close guard for safety but no LOB or buckling noted throughout ambulation. Antalgic gait noted on RLE, decreased B foot clearance and absent heel strike noted, cueing for upright posture and increased foot clearance and step length as pain allows. Pt educated on sequencing of RW and BLE with step to pattern for the most support with use of BUE through RW to assist, as well as when stepping backwards to the bed   Stairs             Wheelchair Mobility    Modified Rankin (Stroke Patients Only)       Balance Overall  balance assessment: Needs assistance Sitting-balance support: No upper extremity supported, Feet supported Sitting balance-Leahy Scale: Good     Standing balance support: Bilateral upper extremity  supported, Reliant on assistive device for balance, During functional activity Standing balance-Leahy Scale: Poor Standing balance comment: reliant on RW                            Cognition Arousal/Alertness: Awake/alert Behavior During Therapy: WFL for tasks assessed/performed Overall Cognitive Status: Within Functional Limits for tasks assessed                                          Exercises Other Exercises Other Exercises: Discussed with pt her HEP she was performing at home, instructed in LAQ, performing ~5 reps BLE in sitting. Pt reports performing ankle pumps, seated marches, hip abduction/adduction in sitting, also educated on glute squeezes or standing hip extension if tolerating to help build muscle before THR    General Comments General comments (skin integrity, edema, etc.): VSS with pt on 3L O2 [place]      Pertinent Vitals/Pain Pain Assessment Pain Assessment: Faces Faces Pain Scale: Hurts even more Pain Location: hips Pain Descriptors / Indicators: Aching Pain Intervention(s): Limited activity within patient's tolerance, Monitored during session, Repositioned    Home Living                          Prior Function            PT Goals (current goals can now be found in the care plan section) Acute Rehab PT Goals Patient Stated Goal: to return home PT Goal Formulation: With patient Time For Goal Achievement: 03/15/23 Potential to Achieve Goals: Good Progress towards PT goals: Progressing toward goals    Frequency    Min 3X/week      PT Plan Current plan remains appropriate    Co-evaluation              AM-PAC PT "6 Clicks" Mobility   Outcome Measure  Help needed turning from your back to your side while in a flat bed without using bedrails?: None Help needed moving from lying on your back to sitting on the side of a flat bed without using bedrails?: None Help needed moving to and from a bed to a chair  (including a wheelchair)?: A Little Help needed standing up from a chair using your arms (e.g., wheelchair or bedside chair)?: A Little Help needed to walk in hospital room?: A Lot Help needed climbing 3-5 steps with a railing? : Total 6 Click Score: 17    End of Session Equipment Utilized During Treatment: Oxygen Activity Tolerance: Patient limited by pain Patient left: in bed;with call bell/phone within reach Nurse Communication: Mobility status PT Visit Diagnosis: Other abnormalities of gait and mobility (R26.89);Muscle weakness (generalized) (M62.81);Pain Pain - part of body: Hip     Time: 1610-9604 PT Time Calculation (min) (ACUTE ONLY): 28 min  Charges:  $Gait Training: 8-22 mins $Therapeutic Activity: 8-22 mins                     Lindalou Hose, PT DPT Acute Rehabilitation Services Office [phone]    Leonie Man 03/02/2023, 4:06 PM

## 2023-03-02 NOTE — Progress Notes (Signed)
PHARMACY CONSULT NOTE FOR:  OUTPATIENT  PARENTERAL ANTIBIOTIC THERAPY (OPAT)  Indication: ESBL UTI/pyelo Regimen: Ertapenem 1g IV every 24 hours End date: 03/07/23  IV antibiotic discharge orders are pended. To discharging provider:  please sign these orders via discharge navigator,  Select New Orders & click on the button choice - Manage This Unsigned Work.     Thank you for allowing pharmacy to be a part of this patient's care.  Georgina Pillion, PharmD, BCPS, BCIDP Infectious Diseases Clinical Pharmacist 03/02/2023 1:33 PM   **Pharmacist phone directory can now be found on [email or web address] ([email or web address]).  Listed under [facility].

## 2023-03-02 NOTE — Progress Notes (Signed)
[facility]    Date of Admission:  02/27/2023   Total days of antibiotics 4/ day 2   ID: [name] is a 62 y.o. female with   Principal Problem:   Chest pain, rule out acute myocardial infarction Active Problems:   Essential hypertension: Associated with renal disease   Diabetes mellitus, type II, insulin dependent (HCC)   Hyperlipidemia   Kidney transplant status, cadaveric   History of cardiac arrest   Pulmonary fibrosis, postinflammatory (HCC)   Elevated troponin   Hx of CABG   UTI due to extended-spectrum beta lactamase (ESBL) producing Escherichia coli    Subjective: Afebrile. Feeling better, denies abdominal pain.  Medications:   apixaban  5 mg Oral BID   aspirin  81 mg Oral Daily   atorvastatin  20 mg Oral Daily   carvedilol  25 mg Oral BID WC   cholecalciferol  2,000 Units Oral Daily   gabapentin  300 mg Oral TID   insulin aspart  0-9 Units Subcutaneous TID AC & HS   insulin glargine-yfgn  15 Units Subcutaneous QHS   levothyroxine  100 mcg Oral Q0600   linaclotide  290 mcg Oral QAC breakfast   mycophenolate  360 mg Oral BID   NIFEdipine  60 mg Oral BID   predniSONE  5 mg Oral Daily   sodium chloride flush  3 mL Intravenous Q12H   sulfamethoxazole-trimethoprim  1 tablet Oral Q M,W,F   tacrolimus ER  6 mg Oral Daily    Objective: Vital signs in last 24 hours: Temp:  [97.5 F (36.4 C)-98 F (36.7 C)] 98 F (36.7 C) (06/12 1141) Pulse Rate:  [67-78] 67 (06/12 1141) Resp:  [18] 18 (06/12 1141) BP: (136-160)/(51-71) 158/64 (06/12 1141) SpO2:  [92 %-98 %] 98 % (06/12 1141) Weight:  [82.6 kg] 82.6 kg (06/12 0439)  Physical Exam  Constitutional:  oriented to person, place, and time. appears well-developed and well-nourished. No distress.  HENT: [place]/AT, PERRLA, no scleral icterus Mouth/Throat: Oropharynx is clear and moist. No oropharyngeal exudate.  Cardiovascular: Normal rate, regular rhythm and normal heart sounds. Exam  reveals no gallop and no friction rub.  No murmur heard.  Pulmonary/Chest: Effort normal and breath sounds normal. No respiratory distress.  has no wheezes.  Neck = supple, no nuchal rigidity Abdominal: Soft. Bowel sounds are normal.  exhibits no distension. There is no tenderness.  Lymphadenopathy: no cervical adenopathy. No axillary adenopathy Neurological: alert and oriented to person, place, and time.  Skin: Skin is warm and dry. No rash noted. No erythema.  Psychiatric: a normal mood and affect.  behavior is normal.    Lab Results Recent Labs    03/01/23 0052 03/02/23 0019  WBC 3.9* 4.8  HGB 11.7* 11.9*  HCT 38.5 39.1  NA 134* 137  K 3.8 4.3  CL 100 101  CO2 23 27  BUN 19 20  CREATININE 1.12* 1.09*   Liver Panel Recent Labs    02/27/23 1527  PROT 7.6  ALBUMIN 3.5  AST 18  ALT 13  ALKPHOS 82  BILITOT 0.4   Sedimentation Rate Recent Labs    02/28/23 1610  ESRSEDRATE 17   C-Reactive Protein Recent Labs    02/28/23 1610  CRP 6.2*    Microbiology: Esbl ecoli u cx+ Studies/Results: [place] EKG SITE RITE  Result Date: 03/02/2023 If Site Rite image not attached, placement could not be confirmed due to current cardiac rhythm.    Assessment/Plan: Pyelonephritis in  renal transplant patient = plan for ir to place line to finish out a 7 day course of ertapenem 1gm once a day infusion. Opat is in.  We will need to arrange for line removal next week at completion of iv abtx  Will see back in the ID clinic. Will sign off   [facility] Pager: [phone]  03/02/2023, 3:15 PM

## 2023-03-02 NOTE — Progress Notes (Signed)
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  Patient was also recently diagnosed with outpatient UTI/pyelonephritis on antibiotics.  Upon admission there was concerns of ACS especially in the setting of recent CABG.  Cardiology team consulted.  Echocardiogram overall appears stable with normal LV function therefore heparin drip was discontinued, started back again on Eliquis.  Eventually her urine cultures resulted ESBL therefore transition to ertapenem.  Seen by infectious disease.  Plan is to obtain PICC line and hopefully discharge on ertapenem for total of 7 days   Assessment & Plan:  Principal Problem:   Chest pain, rule out acute myocardial infarction Active Problems:   Kidney transplant status, cadaveric   Essential hypertension: Associated with renal disease   Diabetes mellitus, type II, insulin dependent (HCC)   Hyperlipidemia   History of cardiac arrest   Pulmonary fibrosis, postinflammatory (HCC)   Elevated troponin   Hx of CABG   UTI due to extended-spectrum beta lactamase (ESBL) producing Escherichia coli     Assessment and Plan: * Chest pain, rule out acute myocardial infarction, ruled out CAD status post CABG June 2023 Troponins mildly elevated 216 > 228.  Patient already on outpatient Eliquis which is currently on hold and placed on heparin drip.  Echocardiogram shows EF 65%, grade 2 DD. CTA neg for PE.    Kidney transplant status, Oct 2019 UTI/Pyelonephritis. POA.  Cultures growing ESBL -Follows at [facility]. Continue outpatient  tacrolimus, mycophenolate and prednisone.  Bactrim Monday Wednesday Friday.  Failed outpatient Omnicef - Patient will now be on ertapenem for total of 7 days.  PICC line placement ordered  Patient now agreeable to go home on IV antibiotics.  Pulmonary fibrosis, postinflammatory (HCC) PIPF of RLL and intermittent use of home O2 at baseline following COVID-19 back in 2020.  Not on O2 at all times though.  History of right lower extremity DVT - On Eliquis  History of cardiac arrest 1st arrest was back when she was on dialysis (looks like Oct 2013 or earlier?). 2nd arrest was due to hypotension / hypoxia in Dec 2023 during admit for broken ankle.  Hyperlipidemia Cont Statin  Diabetes mellitus, type II, insulin dependent (HCC) Peripheral Neuropathy.  -A1c 7.7. -Semglee 10 units daily.  Sliding scale and Accu-Chek.  Adjust as necessary  Essential hypertension: Associated with renal disease Currently on Coreg, Procardia.  IV as needed  Hypokalemia - As needed repletion  Hypothyroidism - Synthroid      DVT prophylaxis: on Hep Drip Code Status: Full Family Communication:   Plans for PICC line placement   Diet Orders (From admission, onward)     Start     Ordered   02/28/23 1038  Diet heart healthy/carb modified Room service appropriate? Yes; Fluid consistency: Thin  Diet effective now       Question Answer Comment  Diet-HS Snack? Nothing   Room service appropriate? Yes   Fluid consistency: Thin      02/28/23 1037            Subjective: Seen at bedside.  Agreeable to go home  with PICC line and IV antibiotics.  Does not have any complaints Examination: Constitutional: Not in acute distress Respiratory: Clear to auscultation bilaterally Cardiovascular: Normal sinus rhythm, no rubs Abdomen: Nontender nondistended good bowel sounds Musculoskeletal: No edema noted Skin: No rashes seen Neurologic: CN 2-12 grossly intact.  And nonfocal Psychiatric: Normal judgment  and insight. Alert and oriented x 3. Normal mood. Objective: Vitals:   03/01/23 1623 03/01/23 2009 03/01/23 2300 03/02/23 0439  BP: (!) 152/60 (!) 136/51 (!) 145/55 (!) 156/71  Pulse: 72 73 71 74  Resp: 18 18 18 18   Temp: 97.6 F (36.4 C) 97.6 F (36.4 C) (!) 97.5 F (36.4 C) (!) 97.5 F (36.4 C)  TempSrc: Oral Oral Oral Oral  SpO2: 95% 93% 97% 92%  Weight:    82.6 kg  Height:        Intake/Output Summary (Last 24 hours) at 03/02/2023 0759 Last data filed at 03/02/2023 0631 Gross per 24 hour  Intake 240 ml  Output 1100 ml  Net -860 ml   Filed Weights   02/28/23 1537 03/01/23 0504 03/02/23 0439  Weight: 82.3 kg 84.1 kg 82.6 kg    Scheduled Meds:  apixaban  5 mg Oral BID   aspirin  81 mg Oral Daily   atorvastatin  20 mg Oral Daily   carvedilol  25 mg Oral BID WC   cholecalciferol  2,000 Units Oral Daily   gabapentin  300 mg Oral TID   insulin aspart  0-9 Units Subcutaneous TID AC & HS   insulin glargine-yfgn  10 Units Subcutaneous QHS   levothyroxine  100 mcg Oral Q0600   linaclotide  290 mcg Oral QAC breakfast   mycophenolate  360 mg Oral BID   NIFEdipine  60 mg Oral BID   predniSONE  5 mg Oral Daily   sodium chloride flush  3 mL Intravenous Q12H   sulfamethoxazole-trimethoprim  1 tablet Oral Q M,W,F   tacrolimus ER  6 mg Oral Daily   Continuous Infusions:  sodium chloride     ertapenem      Nutritional status     Body mass index is 33.31 kg/m.  Data Reviewed:   CBC: Recent Labs  Lab 02/27/23 1527 02/28/23 0115 03/01/23 0052 03/02/23 0019  WBC 4.5 4.0 3.9* 4.8  NEUTROABS 2.9  --   --   --   HGB 12.7 11.1* 11.7* 11.9*  HCT 41.7 36.3 38.5 39.1  MCV 69.3* 68.5* 70.3* 69.3*  PLT 193 180 179 213   Basic Metabolic Panel: Recent Labs  Lab 02/27/23 1527 03/01/23 0052 03/02/23 0019  NA 135 134* 137  K 3.4* 3.8 4.3  CL 97* 100 101  CO2 23 23 27   GLUCOSE 217* 202* 195*  BUN 33* 19 20  CREATININE 1.33* 1.12* 1.09*  CALCIUM 9.5 8.7* 9.2    GFR: Estimated Creatinine Clearance: 53.3 mL/min (A) (by C-G formula based on SCr of 1.09 mg/dL (H)). Liver Function Tests: Recent Labs  Lab 02/27/23 1527  AST 18  ALT 13  ALKPHOS 82  BILITOT 0.4  PROT 7.6  ALBUMIN 3.5   Recent Labs  Lab 02/27/23 1527  LIPASE 41   No results for input(s): "AMMONIA" in the last 168 hours. Coagulation Profile: No results for input(s): "INR", "PROTIME" in the last 168 hours. Cardiac Enzymes: No results for input(s): "CKTOTAL", "CKMB", "CKMBINDEX", "TROPONINI" in the last 168 hours. BNP (last 3 results) No results for input(s): "PROBNP" in the last 8760 hours. HbA1C: Recent Labs  02/28/23 0115  HGBA1C 7.7*   CBG: Recent Labs  Lab 03/01/23 0628 03/01/23 1152 03/01/23 1618 03/01/23 2122 03/02/23 0603  GLUCAP 143* 162* 212* 283* 164*   Lipid Profile: No results for input(s): "CHOL", "HDL", "LDLCALC", "TRIG", "CHOLHDL", "LDLDIRECT" in the last 72 hours. Thyroid Function Tests: No results for input(s): "TSH", "T4TOTAL", "FREET4", "T3FREE", "THYROIDAB" in the last 72 hours. Anemia Panel: No results for input(s): "VITAMINB12", "FOLATE", "FERRITIN", "TIBC", "IRON", "RETICCTPCT" in the last 72 hours. Sepsis Labs: No results for input(s): "PROCALCITON", "LATICACIDVEN" in the last 168 hours.  Recent Results (from the past 240 hour(s))  SARS Coronavirus 2 by RT PCR (hospital order, performed in [facility] hospital lab) *cepheid single result test* Anterior Nasal Swab     Status: None   Collection Time: 02/27/23  7:16 PM   Specimen: Anterior Nasal Swab  Result Value Ref Range Status   SARS Coronavirus 2 by RT PCR NEGATIVE NEGATIVE Final    Comment: Performed at [facility], [address] [place], [place] [address]  Urine Culture     Status: Abnormal   Collection Time: 02/27/23 10:47 PM   Specimen: Urine, Clean Catch  Result Value Ref Range Status   Specimen Description URINE, CLEAN CATCH  Final   Special Requests   Final     Immunocompromised Performed at [facility], [address], [place], [place] [address]    Culture (A)  Final    50,000 COLONIES/mL ESCHERICHIA COLI Confirmed Extended Spectrum Beta-Lactamase Producer (ESBL).  In bloodstream infections from ESBL organisms, carbapenems are preferred over piperacillin/tazobactam. They are shown to have a lower risk of mortality.    Report Status 03/01/2023 FINAL  Final   Organism ID, Bacteria ESCHERICHIA COLI (A)  Final      Susceptibility   Escherichia coli - MIC*    AMPICILLIN >=32 RESISTANT Resistant     CEFAZOLIN >=64 RESISTANT Resistant     CEFEPIME 16 RESISTANT Resistant     CEFTRIAXONE >=64 RESISTANT Resistant     CIPROFLOXACIN 1 RESISTANT Resistant     GENTAMICIN >=16 RESISTANT Resistant     IMIPENEM <=0.25 SENSITIVE Sensitive     NITROFURANTOIN <=16 SENSITIVE Sensitive     TRIMETH/SULFA >=320 RESISTANT Resistant     AMPICILLIN/SULBACTAM >=32 RESISTANT Resistant     PIP/TAZO 16 SENSITIVE Sensitive     * 50,000 COLONIES/mL ESCHERICHIA COLI  Blood culture (routine x 2)     Status: None (Preliminary result)   Collection Time: 02/27/23 11:00 PM   Specimen: BLOOD  Result Value Ref Range Status   Specimen Description BLOOD SITE NOT SPECIFIED  Final   Special Requests   Final    BOTTLES DRAWN AEROBIC AND ANAEROBIC Blood Culture adequate volume   Culture   Final    NO GROWTH 2 DAYS Performed at [facility], [address] [place], [place] [address]    Report Status PENDING  Incomplete  Blood culture (routine x 2)     Status: None (Preliminary result)   Collection Time: 02/28/23 10:08 AM   Specimen: BLOOD  Result Value Ref Range Status   Specimen Description BLOOD SITE NOT SPECIFIED  Final   Special Requests   Final    BOTTLES DRAWN AEROBIC AND ANAEROBIC Blood Culture results may not be optimal due to an excessive volume of blood received in culture bottles   Culture   Final    NO GROWTH < 24 HOURS Performed at [facility], [address] [place], [place] [address]    Report Status PENDING  Incomplete         Radiology Studies: ECHOCARDIOGRAM COMPLETE  Result Date: 02/28/2023    ECHOCARDIOGRAM REPORT   Patient Name:   LUCILLIE PUNTER Date of Exam: 02/28/2023 Medical Rec #:  [id number]       Height:       62.0 in Accession #:    [id number]      Weight:       187.0 lb Date of Birth:  1961/04/16       BSA:          1.858 m Patient Age:    62 years        BP:           152/72 mmHg Patient Gender: F               HR:           67 bpm. Exam Location:  Inpatient Procedure: 2D Echo, Cardiac Doppler and Color Doppler Indications:    r/o acute infarction  History:        Patient has prior history of Echocardiogram examinations, most                 recent 04/26/2022. Prior CABG, PAD, Signs/Symptoms:Shortness of                 Breath; Risk Factors:Hypertension, Diabetes and Dyslipidemia.  Sonographer:    Melissa Morford RDCS (AE, PE) Referring Phys: [id number] JARED M GARDNER IMPRESSIONS  1. Left ventricular ejection fraction, by estimation, is 60 to 65%. The left ventricle has normal function. The left ventricle has no regional wall motion abnormalities. There is mild concentric left ventricular hypertrophy. Left ventricular diastolic parameters are consistent with Grade II diastolic dysfunction (pseudonormalization).  2. Right ventricular systolic function is normal. The right ventricular size is normal.  3. The mitral valve is grossly normal. No evidence of mitral valve regurgitation. No evidence of mitral stenosis.  4. The aortic valve is tricuspid. There is mild calcification of the aortic valve. There is mild thickening of the aortic valve. Aortic valve regurgitation is not visualized. Aortic valve sclerosis/calcification is present, without any evidence of aortic stenosis.  5. The inferior vena cava is normal in size with greater than 50% respiratory variability, suggesting right atrial pressure of 3 mmHg. Comparison(s): No  significant change from prior study. FINDINGS  Left Ventricle: Left ventricular ejection fraction, by estimation, is 60 to 65%. The left ventricle has normal function. The left ventricle has no regional wall motion abnormalities. The left ventricular internal cavity size was normal in size. There is  mild concentric left ventricular hypertrophy. Left ventricular diastolic parameters are consistent with Grade II diastolic dysfunction (pseudonormalization). Right Ventricle: The right ventricular size is normal. Right vetricular wall thickness was not well visualized. Right ventricular systolic function is normal. Left Atrium: Left atrial size was normal in size. Right Atrium: Right atrial size was normal in size. Pericardium: There is no evidence of pericardial effusion. Mitral Valve: The mitral valve is grossly normal. Mild to moderate mitral annular calcification. No evidence of mitral valve regurgitation. No evidence of mitral valve stenosis. Tricuspid Valve: The tricuspid valve is normal in structure. Tricuspid valve regurgitation is trivial. Aortic Valve: The aortic valve is tricuspid. There is mild calcification of the aortic valve. There is mild thickening of the aortic valve. Aortic valve regurgitation is not visualized. Aortic valve sclerosis/calcification is present, without any evidence  of aortic stenosis. Pulmonic Valve: The pulmonic valve was normal in structure. Pulmonic valve regurgitation is trivial. Aorta: The aortic root and ascending aorta are structurally normal, with no evidence of dilitation. Venous: The inferior vena cava is normal in size with greater than 50% respiratory variability, suggesting right atrial pressure of 3 mmHg. IAS/Shunts: The atrial septum is grossly normal.  LEFT VENTRICLE PLAX 2D LVIDd:         3.90 cm   Diastology LVIDs:         2.80 cm   LV e' medial:    4.57 cm/s LV PW:         1.20 cm   LV E/e' medial:  23.2 LV IVS:        1.40 cm   LV e' lateral:   6.74 cm/s LVOT diam:      2.10 cm   LV E/e' lateral: 15.7 LV SV:         84 LV SV Index:   45 LVOT Area:     3.46 cm  RIGHT VENTRICLE RV S prime:     8.49 cm/s TAPSE (M-mode): 1.4 cm LEFT ATRIUM             Index        RIGHT ATRIUM           Index LA diam:        3.80 cm 2.05 cm/m   RA Area:     19.50 cm LA Vol (A2C):   39.1 ml 21.05 ml/m  RA Volume:   53.00 ml  28.53 ml/m LA Vol (A4C):   45.0 ml 24.22 ml/m LA Biplane Vol: 42.0 ml 22.61 ml/m  AORTIC VALVE LVOT Vmax:   95.80 cm/s LVOT Vmean:  75.000 cm/s LVOT VTI:    0.242 m  AORTA Ao Root diam: 2.40 cm Ao Asc diam:  2.80 cm MITRAL VALVE                TRICUSPID VALVE MV Area (PHT): 3.61 cm     TR Peak grad:   14.9 mmHg MV E velocity: 106.00 cm/s  TR Vmax:        193.00 cm/s MV A velocity: 85.90 cm/s MV E/A ratio:  1.23         SHUNTS                             Systemic VTI:  0.24 m                             Systemic Diam: 2.10 cm Laurance Flatten MD Electronically signed by Laurance Flatten MD Signature Date/Time: 02/28/2023/11:15:03 AM    Final            LOS: 1 day   Time spent= 35 mins    Omarr Hann Joline Maxcy, MD [facility]  If 7PM-7AM, please contact night-coverage  03/02/2023, 7:59 AM

## 2023-03-02 NOTE — TOC Progression Note (Signed)
Transition of Care [facility]) - Progression Note    Patient Details  Name: [name] MRN: [id number] Date of Birth: 1961/03/22  Transition of Care [facility]) CM/SW Contact  Leone Haven, RN Phone Number: 03/02/2023, 4:54 PM  Clinical Narrative:    NCM spoke with patient about recs form PT eval for HHPT, she states she does not want this NCM to set this up for her , she has [employer] in [place] and they will get this for her when she get back home.          Expected Discharge Plan and Services         Expected Discharge Date: 03/01/23                                     Social Determinants of Health (SDOH) Interventions SDOH Screenings   Food Insecurity: No Food Insecurity (02/28/2023)  Housing: Low Risk  (02/28/2023)  Transportation Needs: No Transportation Needs (02/28/2023)  Utilities: Not At Risk (02/28/2023)  Tobacco Use: Medium Risk (02/27/2023)    Readmission Risk Interventions    05/12/2022    4:29 PM  Readmission Risk Prevention Plan  Transportation Screening Complete  PCP or Specialist Appt within 3-5 Days Complete  HRI or Home Care Consult Complete  Social Work Consult for Recovery Care Planning/Counseling Complete  Palliative Care Screening Not Applicable  Medication Review Oceanographer) Complete

## 2023-03-03 ENCOUNTER — Inpatient Hospital Stay (HOSPITAL_COMMUNITY): Payer: 59

## 2023-03-03 DIAGNOSIS — Z4931 Encounter for adequacy testing for hemodialysis: Secondary | ICD-10-CM

## 2023-03-03 HISTORY — PX: IR US GUIDE VASC ACCESS RIGHT: IMG2390

## 2023-03-03 HISTORY — PX: IR FLUORO GUIDE CV LINE RIGHT: IMG2283

## 2023-03-03 LAB — CULTURE, BLOOD (ROUTINE X 2)

## 2023-03-03 LAB — GLUCOSE, CAPILLARY
Glucose-Capillary: 157 mg/dL — ABNORMAL HIGH (ref 70–99)
Glucose-Capillary: 174 mg/dL — ABNORMAL HIGH (ref 70–99)

## 2023-03-03 LAB — BASIC METABOLIC PANEL
Anion gap: 13 (ref 5–15)
BUN: 13 mg/dL (ref 8–23)
CO2: 23 mmol/L (ref 22–32)
Calcium: 9.2 mg/dL (ref 8.9–10.3)
Chloride: 98 mmol/L (ref 98–111)
Creatinine, Ser: 0.82 mg/dL (ref 0.44–1.00)
GFR, Estimated: >60 mL/min (ref >60)
Glucose, Bld: 187 mg/dL — ABNORMAL HIGH (ref 70–99)
Potassium: 3.9 mmol/L (ref 3.5–5.1)
Sodium: 134 mmol/L — ABNORMAL LOW (ref 135–145)

## 2023-03-03 LAB — CBC
HCT: 39.1 % (ref 36.0–46.0)
Hemoglobin: 12 g/dL (ref 12.0–15.0)
MCH: 21.4 pg — ABNORMAL LOW (ref 26.0–34.0)
MCHC: 30.7 g/dL (ref 30.0–36.0)
MCV: 69.7 fL — ABNORMAL LOW (ref 80.0–100.0)
Platelets: 193 10*3/uL (ref 150–400)
RBC: 5.61 MIL/uL — ABNORMAL HIGH (ref 3.87–5.11)
RDW: 19.6 % — ABNORMAL HIGH (ref 11.5–15.5)
WBC: 4.8 10*3/uL (ref 4.0–10.5)
nRBC: 0 % (ref 0.0–0.2)

## 2023-03-03 MED ORDER — ERTAPENEM IV (FOR PTA / DISCHARGE USE ONLY)
1.0000 g | INTRAVENOUS | 0 refills | Status: AC
Start: 2023-03-03 — End: 2023-03-08

## 2023-03-03 MED ORDER — CHLORHEXIDINE GLUCONATE CLOTH 2 % EX PADS
6.0000 | MEDICATED_PAD | Freq: Every day | CUTANEOUS | Status: DC
  Administered 2023-03-03: 6 via TOPICAL

## 2023-03-03 MED ORDER — LIDOCAINE-EPINEPHRINE 1 %-1:100000 IJ SOLN
INTRAMUSCULAR | Status: AC
  Filled 2023-03-03: qty 1

## 2023-03-03 NOTE — TOC Progression Note (Signed)
Transition of Care [facility]) - Progression Note    Patient Details  Name: [name] MRN: [id number] Date of Birth: 1960-09-29  Transition [facility] Care [facility]) CM/SW Contact  Leone Haven, RN Phone Number: 03/03/2023, 11:06 AM  Clinical Narrative:    NCM was informed by MD that patient received CVC line today and will be going home with IV abx til 6/17 and will need the line to be pulled by IR. Jeri Modena will be coming to do the teaching at 12 noon today and a  HHRN from [employer] will go by to see patient tomorrow to reinforce teaching with patient son.  Patient will be going to stay with her son at  [address] [place] , [place] [address] [employer] phone [phone]) til she completes the iv abx on 6/17.  Plan for IR to remove CVC line on 6/18.  Will need order from MD for IR to remove it.  MD is aware.  Also patient is on 2 liters with [employer], her concentrator is at home in [place].  This NCM contacted [employer], spoke with Terri, she states they will have a rep to deliver a concentrator to her son's home today at the address above.  This NCM informed patient to call if her concentrator is not delivered today.  She states she still does not want the HHPT set up. She will take care of that her self when she gets back to [place].       Expected Discharge Plan: Home w Home Health Services Barriers to Discharge: No Barriers Identified  Expected Discharge Plan and Services In-house Referral: NA Discharge Planning Services: CM Consult Post Acute Care Choice: Home Health Living arrangements for the past 2 months: Single Family Home Expected Discharge Date: 03/01/23               DME Arranged: N/A DME Agency: NA       HH Arranged: RN HH Agency:  ([facility]- Jeri Modena) Date HH Agency Contacted: 03/03/23 Time HH Agency Contacted: 1104 Representative spoke with at [facility] Agency: Jeri Modena   Social Determinants of Health (SDOH) Interventions SDOH Screenings    Food Insecurity: No Food Insecurity (02/28/2023)  Housing: Low Risk  (02/28/2023)  Transportation Needs: No Transportation Needs (02/28/2023)  Utilities: Not At Risk (02/28/2023)  Tobacco Use: Medium Risk (02/27/2023)    Readmission Risk Interventions    05/12/2022    4:29 PM  Readmission Risk Prevention Plan  Transportation Screening Complete  PCP or Specialist Appt within 3-5 Days Complete  HRI or Home Care Consult Complete  Social Work Consult for Recovery Care Planning/Counseling Complete  Palliative Care Screening Not Applicable  Medication Review Oceanographer) Complete

## 2023-03-03 NOTE — TOC Transition Note (Addendum)
Transition of Care [facility]) - CM/SW Discharge Note   Patient Details  Name: [name] MRN: [id number] Date of Birth: 1961/03/01  Transition of Care [facility]) CM/SW Contact:  Leone Haven, RN Phone Number: 03/03/2023, 11:07 AM   Clinical Narrative:    Patient is for dc today, Jeri Modena will be here at noon to do teaching with her. Also [employer] will deliver oxygen concentrator to her son's home today.  Patient states she has 3 tanks with her.  She states son and DIL will transport her home.  Per IR they will contact patient to schedule the removal of the CVC.     Final next level of care: Home w Home Health Services Barriers to Discharge: No Barriers Identified   Patient Goals and CMS Choice CMS Medicare.gov Compare Post Acute Care list provided to:: Patient Choice offered to / list presented to : Patient  Discharge Placement                         Discharge Plan and Services Additional resources added to the After Visit Summary for   In-house Referral: NA Discharge Planning Services: CM Consult Post Acute Care Choice: Home Health          DME Arranged: N/A DME Agency: NA       HH Arranged: RN HH Agency:  ([facility]- Jeri Modena) Date HH Agency Contacted: 03/03/23 Time HH Agency Contacted: 1104 Representative spoke with at [facility] Agency: Jeri Modena  Social Determinants of Health (SDOH) Interventions SDOH Screenings   Food Insecurity: No Food Insecurity (02/28/2023)  Housing: Low Risk  (02/28/2023)  Transportation Needs: No Transportation Needs (02/28/2023)  Utilities: Not At Risk (02/28/2023)  Tobacco Use: Medium Risk (02/27/2023)     Readmission Risk Interventions    05/12/2022    4:29 PM  Readmission Risk Prevention Plan  Transportation Screening Complete  PCP or Specialist Appt within 3-5 Days Complete  HRI or Home Care Consult Complete  Social Work Consult for Recovery Care Planning/Counseling Complete  Palliative Care Screening Not  Applicable  Medication Review Oceanographer) Complete

## 2023-03-03 NOTE — TOC Progression Note (Signed)
Transition of Care [facility]) - Progression Note    Patient Details  Name: [name] MRN: [id number] Date of Birth: [birth year]  Transition of Care [facility]) CM/SW Contact  Leone Haven, RN Phone Number: 03/03/2023, 10:49 AM  Clinical Narrative:    NCM was informed by MD that patient received CVC line today and will be going home with IV abx til 6/17 and will need the line to be pulled by IR. Jeri Modena will be coming to do the teaching at 12 noon today and a  HHRN from [employer] will go by to see patient tomorrow to reinforce teaching with patient son.  Patient will be going to stay with her son at  [address] [place] , [place] [address] [employer] phone [phone]) til she completes the iv abx on 6/17.  Plan for IR to remove CVC line on 6/18.  Will need order from MD for IR to remove it.  MD is aware.  Also patient is on 2 liters with [employer], her concentrator is at home in [place].  This NCM contacted [employer], spoke with Terri, she states they will have a rep to deliver a concentrator to her son's home today at the address above.  This NCM informed patient to call if her concentrator is not delivered today.  She states she still does not want the HHPT set up. She will take care of that her self when she gets back to [place].          Expected Discharge Plan and Services         Expected Discharge Date: 03/01/23                                     Social Determinants of Health (SDOH) Interventions SDOH Screenings   Food Insecurity: No Food Insecurity (02/28/2023)  Housing: Low Risk  (02/28/2023)  Transportation Needs: No Transportation Needs (02/28/2023)  Utilities: Not At Risk (02/28/2023)  Tobacco Use: Medium Risk (02/27/2023)    Readmission Risk Interventions    05/12/2022    4:29 PM  Readmission Risk Prevention Plan  Transportation Screening Complete  PCP or Specialist Appt within 3-5 Days Complete  HRI or Home Care Consult Complete   Social Work Consult for Recovery Care Planning/Counseling Complete  Palliative Care Screening Not Applicable  Medication Review Oceanographer) Complete

## 2023-03-03 NOTE — Discharge Summary (Signed)
Physician Discharge Summary  Patient ID: [name] MRN: [id number] DOB/AGE: 1961/06/18 62 y.o.  Admit date: 02/27/2023 Discharge date: 03/03/2023  Admission Diagnoses:  Discharge Diagnoses:  Principal Problem:   Chest pain, rule out acute myocardial infarction Active Problems:   Essential hypertension: Associated with renal disease   Diabetes mellitus, type II, insulin dependent (HCC)   Hyperlipidemia   Kidney transplant status, cadaveric   History of cardiac arrest   Pulmonary fibrosis, postinflammatory (HCC)   Elevated troponin   Hx of CABG   UTI due to extended-spectrum beta lactamase (ESBL) producing Escherichia coli   Discharged Condition:    Hospital Course:  Patient is a 62 year old female with history of CAD status post CABG in June 2023, ESRD status post DDRT functioning transplant, cardiac arrest, most recent 2023 with full recovery, heart murmur, DM2, HTN, pulmonary fibrosis following COVID-19 on home oxygen and AVMs.  Patient was admitted with chest pain and fatigue.  Patient was also recently diagnosed with outpatient UTI/pyelonephritis on antibiotics.  On admission, there were concerns for possible ACS, especially, in the setting of recent CABG. patient was admitted for further workup and management.  Cardiology team and infectious disease team were consulted to assist with patient's management.  Echocardiogram overall appears stable with normal LV function therefore heparin drip was discontinued, and patient was started back again on Eliquis.  Cardiology team did not advise further cardiac workup.  Urine culture grew E. coli ESBL, and patient's antibiotics was changed to ertapenem.  Patient will complete course of ertapenem on 03/07/2023.  PICC line was placed prior to discharge.  Patient will return to the hospital for PICC line removal on 03/08/2023.  Cardiology and infectious disease teams have cleared patient for discharge.  Patient will follow-up with primary care  provider, cardiology and infectious disease team on discharge.    Chest pain: -Ruled out acute myocardial infarction. -History of CAD status post CABG June 2023 -Troponins were mildly elevated, but remained flat 216 > 228.   -Cardiology team directed care. -Echocardiogram is as documented. -CTA chest was negative for PE. -No further cardiac workup is recommended by the cardiology team.      Kidney transplant status, Oct 2019 UTI/Pyelonephritis. POA.  Cultures growing ESBL -Follows at [facility]. Continue outpatient tacrolimus, mycophenolate and prednisone.  Bactrim Monday Wednesday Friday.  Failed outpatient Omnicef - Patient will now be on ertapenem for total of 7 days.  PICC line placement ordered   Pulmonary fibrosis, postinflammatory (HCC) PIPF of RLL and intermittent use of home O2 at baseline following COVID-19 back in 2020.     History of right lower extremity DVT - On Eliquis   History of cardiac arrest 1st arrest was back when she was on dialysis (looks like Oct 2013 or earlier?). 2nd arrest was due to hypotension / hypoxia in Dec 2023 during admit for broken ankle.   Hyperlipidemia Cont Statin   Diabetes mellitus, type II, insulin dependent (HCC) Peripheral Neuropathy.  -A1c 7.7. -Semglee 10 units daily.  Sliding scale and Accu-Chek.  Adjust as necessary   Essential hypertension: Associated with renal disease Currently on Coreg, Procardia.  IV as needed   Hypokalemia - As needed repletion   Hypothyroidism - Synthroid      Consults: cardiology and ID  Significant Diagnostic Studies:  Urine culture: Grew E. coli, ESBL.  Echocardiogram revealed: 1. Left ventricular ejection fraction, by estimation, is 60 to 65%. The  left ventricle has normal function. The left ventricle has no regional  wall motion  abnormalities. There is mild concentric left ventricular  hypertrophy. Left ventricular diastolic  parameters are consistent with Grade II diastolic dysfunction   (pseudonormalization).   2. Right ventricular systolic function is normal. The right ventricular  size is normal.   3. The mitral valve is grossly normal. No evidence of mitral valve  regurgitation. No evidence of mitral stenosis.   4. The aortic valve is tricuspid. There is mild calcification of the  aortic valve. There is mild thickening of the aortic valve. Aortic valve  regurgitation is not visualized. Aortic valve sclerosis/calcification is  present, without any evidence of  aortic stenosis.   5. The inferior vena cava is normal in size with greater than 50%  respiratory variability, suggesting right atrial pressure of 3 mmHg.   CTA chest PE protocol/CT abdomen and pelvis with contrast revealed: 1. Negative for acute pulmonary embolism. 2. Findings suggestive of congestive heart failure with mild pulmonary edema. 3. Areas of geographic hypoattenuation within the right lower quadrant renal transplant suspicious for pyelonephritis. 4. 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   Treatments: Patient will complete treatment for UTI secondary to E. coli ESBL.  Discharge Exam: Blood pressure (!) 155/55, pulse 81, temperature 97.7 F (36.5 C), temperature source Oral, resp. rate 18, height 5\' 2"  (1.575 m), weight 82.4 kg, SpO2 91 %.   Disposition: Discharge disposition: 01-Home or Self Care       Discharge Instructions     [facility] pharmacist to adjust dose for Vancomycin, Aminoglycosides and other anti-infective therapies as requested by physician.   Complete by: As directed    [facility] to provide Cath Flo 2mg    Complete by: As directed    Administer for PICC line occlusion and as ordered by physician for other access device issues.   Anaphylaxis Kit: Provided to treat any anaphylactic reaction to the medication being provided to the patient if First Dose or when requested by physician   Complete by: As  directed    Epinephrine 1mg /ml vial / amp: Administer 0.3mg  (0.33ml) subcutaneously once for moderate to severe anaphylaxis, nurse to call physician and pharmacy when reaction occurs and call 911 if needed for immediate care   Diphenhydramine 50mg /ml IV vial: Administer 25-50mg  IV/IM PRN for first dose reaction, rash, itching, mild reaction, nurse to call physician and pharmacy when reaction occurs   Sodium Chloride 0.9% NS IV: Administer if needed for hypovolemic blood pressure drop or as ordered by physician after call to physician with anaphylactic reaction   Change dressing on IV access line weekly and PRN   Complete by: As directed    Diet - low sodium heart healthy   Complete by: As directed    Discharge wound care:   Complete by: As directed    Continue current wound care plan.   Flush IV access with Sodium Chloride 0.9% and Heparin 10 units/ml or 100 units/ml   Complete by: As directed    Home infusion instructions - [facility]   Complete by: As directed    Instructions: Flush IV access with Sodium Chloride 0.9% and Heparin 10units/ml or 100units/ml   Change dressing on IV access line: Weekly and PRN   Instructions Cath Flo 2mg : Administer for PICC Line occlusion and as ordered by physician for other access device   [facility] pharmacist to adjust dose for: Vancomycin, Aminoglycosides and other anti-infective therapies as requested by physician   Increase activity slowly  Complete by: As directed    Method of administration may be changed at the discretion of home infusion pharmacist based upon assessment of the patient and/or caregiver's ability to self-administer the medication ordered   Complete by: As directed       Allergies as of 03/03/2023       Reactions   Lisinopril Swelling   Swelling of tongue   Hydralazine    Other reaction(s): heart palpitations        Medication List     STOP taking these medications    cefdinir 300 MG  capsule Commonly known as: OMNICEF       TAKE these medications    apixaban 5 MG Tabs tablet Commonly known as: ELIQUIS Take 5 mg by mouth 2 (two) times daily.   aspirin 81 MG chewable tablet Chew 81 mg by mouth daily.   atorvastatin 20 MG tablet Commonly known as: LIPITOR Take 20 mg by mouth daily.   carvedilol 12.5 MG tablet Commonly known as: COREG Take 1 tablet (12.5 mg total) by mouth 2 (two) times daily with a meal. What changed: how much to take   cyclobenzaprine 10 MG tablet Commonly known as: FLEXERIL Take 10 mg by mouth 2 (two) times daily as needed for muscle spasms.   ertapenem  IVPB Commonly known as: INVANZ Inject 1 g into the vein daily for 5 days. Indication:  ESBL UTI/pyelo First Dose: Yes Last Day of Therapy:  03/07/23 Labs - Once weekly:  CBC/D and BMP, ESR and CRP Method of administration: Mini-Bag Plus / Gravity Pull PICC/midline/central line at the completion of IV antibiotic therapy Method of administration may be changed at the discretion of home infusion pharmacist based upon assessment of the patient and/or caregiver's ability to self-administer the medication ordered.   famotidine 40 MG tablet Commonly known as: PEPCID Take 40 mg by mouth daily as needed for heartburn or indigestion.   gabapentin 300 MG capsule Commonly known as: NEURONTIN Take 300 mg by mouth 3 (three) times daily.   Insulin Glargine w/ Trans Port 100 UNIT/ML Sopn Inject 15 Units into the skin at bedtime.   levothyroxine 100 MCG tablet Commonly known as: SYNTHROID Take 100 mcg by mouth daily before breakfast.   Linzess 290 MCG Caps capsule Generic drug: linaclotide Take 290 mcg by mouth daily before breakfast.   mycophenolate 180 MG EC tablet Commonly known as: MYFORTIC Take 360 mg by mouth 2 (two) times daily.   NIFEdipine 60 MG 24 hr tablet Commonly known as: ADALAT CC Take 1 tablet (60 mg total) by mouth daily. What changed: when to take this    nitroGLYCERIN 0.4 MG SL tablet Commonly known as: NITROSTAT Place 0.4 mg under the tongue as needed for chest pain.   NovoLOG FlexPen 100 UNIT/ML FlexPen Generic drug: insulin aspart Inject 5-15 Units into the skin with breakfast, with lunch, and with evening meal.   oxyCODONE-acetaminophen 7.5-325 MG tablet Commonly known as: PERCOCET Take 1 tablet by mouth every 8 (eight) hours as needed for severe pain.   predniSONE 5 MG tablet Commonly known as: DELTASONE Take 5 mg by mouth daily.   PROBIOTIC PO Take 1 capsule by mouth daily.   promethazine 25 MG tablet Commonly known as: PHENERGAN Take 25 mg by mouth every 8 (eight) hours as needed for nausea or vomiting.   Semaglutide 14 MG Tabs Take 14 mg by mouth daily.   sulfamethoxazole-trimethoprim 400-80 MG tablet Commonly known as: BACTRIM Take 1 tablet by mouth every  Monday, Wednesday, and Friday.   tacrolimus ER 1 MG Tb24 Commonly known as: ENVARSUS XR Take 6 mg by mouth daily.   torsemide 20 MG tablet Commonly known as: DEMADEX Take 2 tablets (40 mg total) by mouth daily. What changed: when to take this   Ventolin HFA 108 (90 Base) MCG/ACT inhaler Generic drug: albuterol Inhale 1 puff into the lungs every 4 (four) hours as needed for wheezing or shortness of breath.   Vitamin D 50 MCG (2000 UT) tablet Take 2,000 Units by mouth daily.               Discharge Care Instructions  (From admission, onward)           Start     Ordered   03/03/23 0000  Change dressing on IV access line weekly and PRN  (Home infusion instructions - [facility] )        03/03/23 1352   03/03/23 0000  Discharge wound care:       Comments: Continue current wound care plan.   03/03/23 1407            Follow-up Information     Merlyn Albert, FNP Follow up in 1 week(s).   Specialty: Internal Medicine Contact information: [address] [place] [address] [phone]                  Signed: Barnetta Chapel 03/03/2023, 2:07 PM

## 2023-03-03 NOTE — Procedures (Signed)
Interventional Radiology Procedure:   Indications: Pyelonephritis and needs IV antibiotics  Procedure: Placement of tunneled central line  Findings: Single lumen Powerline (20 cm) placed in right external jugular vein, tip at SVC/RA junction.  Flushed with saline.   Complications: None     EBL: Minimal  Plan: Central line is ready to use.   Kaliq Lege R. Lowella Dandy, MD  Pager: [phone]

## 2023-03-04 ENCOUNTER — Other Ambulatory Visit (HOSPITAL_COMMUNITY): Payer: Self-pay | Admitting: Internal Medicine

## 2023-03-04 ENCOUNTER — Encounter (HOSPITAL_COMMUNITY): Payer: Self-pay

## 2023-03-04 DIAGNOSIS — Z4931 Encounter for adequacy testing for hemodialysis: Secondary | ICD-10-CM

## 2023-03-04 LAB — CULTURE, BLOOD (ROUTINE X 2): Special Requests: ADEQUATE

## 2023-03-05 LAB — CULTURE, BLOOD (ROUTINE X 2)

## 2023-03-07 ENCOUNTER — Other Ambulatory Visit (HOSPITAL_COMMUNITY): Payer: Self-pay | Admitting: Internal Medicine

## 2023-03-07 DIAGNOSIS — N12 Tubulo-interstitial nephritis, not specified as acute or chronic: Secondary | ICD-10-CM

## 2023-03-08 ENCOUNTER — Other Ambulatory Visit (HOSPITAL_COMMUNITY): Payer: Self-pay | Admitting: Internal Medicine

## 2023-03-08 ENCOUNTER — Ambulatory Visit (HOSPITAL_COMMUNITY)
Admission: RE | Admit: 2023-03-08 | Discharge: 2023-03-08 | Disposition: A | Discharge status: Home / Self Care | Payer: 59 | Source: Ambulatory Visit | Attending: Internal Medicine | Admitting: Internal Medicine

## 2023-03-08 DIAGNOSIS — N12 Tubulo-interstitial nephritis, not specified as acute or chronic: Secondary | ICD-10-CM | POA: Diagnosis not present

## 2023-03-08 DIAGNOSIS — Z452 Encounter for adjustment and management of vascular access device: Secondary | ICD-10-CM | POA: Insufficient documentation

## 2023-03-08 HISTORY — PX: IR REMOVAL TUN CV CATH W/O FL: IMG2289

## 2023-03-08 MED ORDER — CHLORHEXIDINE GLUCONATE 4 % EX SOLN
CUTANEOUS | Status: AC
  Filled 2023-03-08: qty 15

## 2023-03-08 NOTE — Procedures (Signed)
.  Interventional Radiology Procedure Note  PROCEDURE SUMMARY:  Successful removal of tunneled catheter.  No complications.   EBL = trace  Please see full dictation in imaging section of Epic for procedure details.  Ardelia Wrede S Daimon Kean     

## 2023-06-27 ENCOUNTER — Emergency Department (HOSPITAL_COMMUNITY): Payer: 59

## 2023-06-27 ENCOUNTER — Encounter (HOSPITAL_COMMUNITY): Payer: Self-pay

## 2023-06-27 ENCOUNTER — Other Ambulatory Visit: Payer: Self-pay

## 2023-06-27 ENCOUNTER — Emergency Department (HOSPITAL_COMMUNITY)
Admission: EM | Admit: 2023-06-27 | Discharge: 2023-06-27 | Discharge status: Against Medical Advice | Payer: 59 | Attending: Emergency Medicine | Admitting: Emergency Medicine

## 2023-06-27 DIAGNOSIS — R300 Dysuria: Secondary | ICD-10-CM | POA: Diagnosis not present

## 2023-06-27 DIAGNOSIS — Z5321 Procedure and treatment not carried out due to patient leaving prior to being seen by health care provider: Secondary | ICD-10-CM | POA: Diagnosis not present

## 2023-06-27 DIAGNOSIS — R110 Nausea: Secondary | ICD-10-CM | POA: Diagnosis not present

## 2023-06-27 DIAGNOSIS — R079 Chest pain, unspecified: Secondary | ICD-10-CM | POA: Insufficient documentation

## 2023-06-27 LAB — CBC
HCT: 44.4 % (ref 36.0–46.0)
Hemoglobin: 13.5 g/dL (ref 12.0–15.0)
MCH: 22.5 pg — ABNORMAL LOW (ref 26.0–34.0)
MCHC: 30.4 g/dL (ref 30.0–36.0)
MCV: 74.1 fL — ABNORMAL LOW (ref 80.0–100.0)
Platelets: 140 10*3/uL — ABNORMAL LOW (ref 150–400)
RBC: 5.99 MIL/uL — ABNORMAL HIGH (ref 3.87–5.11)
RDW: 18.9 % — ABNORMAL HIGH (ref 11.5–15.5)
WBC: 5.2 10*3/uL (ref 4.0–10.5)
nRBC: 0 % (ref 0.0–0.2)

## 2023-06-27 LAB — BASIC METABOLIC PANEL
Anion gap: 20 — ABNORMAL HIGH (ref 5–15)
BUN: 34 mg/dL — ABNORMAL HIGH (ref 8–23)
CO2: 21 mmol/L — ABNORMAL LOW (ref 22–32)
Calcium: 9.6 mg/dL (ref 8.9–10.3)
Chloride: 99 mmol/L (ref 98–111)
Creatinine, Ser: 1.54 mg/dL — ABNORMAL HIGH (ref 0.44–1.00)
GFR, Estimated: 38 mL/min — ABNORMAL LOW (ref >60)
Glucose, Bld: 117 mg/dL — ABNORMAL HIGH (ref 70–99)
Potassium: 3.8 mmol/L (ref 3.5–5.1)
Sodium: 140 mmol/L (ref 135–145)

## 2023-06-27 LAB — TROPONIN I (HIGH SENSITIVITY): Troponin I (High Sensitivity): 43 ng/L — ABNORMAL HIGH (ref <18)

## 2023-06-27 NOTE — ED Triage Notes (Signed)
Pt c/o int non radiating left sided chest pain, nausea started last night. Pt wears 3L 02 per [place] at baseline. Pt c/o dysuria started today

## 2023-06-27 NOTE — ED Notes (Signed)
Pt stated "did not want to be here any longer". Pt seen leaving and waiting for ride outside.
